# Patient Record
Sex: Female | Born: 1960 | Race: White | Hispanic: No | Smoking: Current every day smoker
Health system: Southern US, Community
[De-identification: ages and names within clinical notes are randomized; demographics above are authoritative.]

## PROBLEM LIST (undated history)

## (undated) DIAGNOSIS — J45909 Unspecified asthma, uncomplicated: Secondary | ICD-10-CM

## (undated) DIAGNOSIS — F419 Anxiety disorder, unspecified: Secondary | ICD-10-CM

## (undated) DIAGNOSIS — E785 Hyperlipidemia, unspecified: Secondary | ICD-10-CM

## (undated) DIAGNOSIS — F4310 Post-traumatic stress disorder, unspecified: Secondary | ICD-10-CM

## (undated) DIAGNOSIS — M419 Scoliosis, unspecified: Secondary | ICD-10-CM

## (undated) DIAGNOSIS — M7070 Other bursitis of hip, unspecified hip: Secondary | ICD-10-CM

## (undated) HISTORY — DX: Anxiety disorder, unspecified: F41.9

## (undated) HISTORY — PX: ENDOMETRIAL ABLATION: SHX621

## (undated) HISTORY — PX: CHOLECYSTECTOMY: SHX55

## (undated) HISTORY — PX: ABDOMINAL HYSTERECTOMY: SHX81

## (undated) HISTORY — PX: NOSE SURGERY: SHX723

## (undated) HISTORY — DX: Other bursitis of hip, unspecified hip: M70.70

---

## 2005-03-04 ENCOUNTER — Emergency Department: Payer: Self-pay | Admitting: Emergency Medicine

## 2007-05-29 ENCOUNTER — Ambulatory Visit: Payer: Self-pay | Admitting: Gastroenterology

## 2007-06-21 ENCOUNTER — Emergency Department: Payer: Self-pay | Admitting: Emergency Medicine

## 2007-07-18 ENCOUNTER — Ambulatory Visit: Payer: Self-pay | Admitting: Internal Medicine

## 2010-01-25 ENCOUNTER — Ambulatory Visit: Payer: Self-pay | Admitting: Internal Medicine

## 2010-05-26 ENCOUNTER — Ambulatory Visit: Payer: Self-pay | Admitting: General Practice

## 2010-06-30 ENCOUNTER — Other Ambulatory Visit: Payer: Self-pay | Admitting: Family

## 2010-06-30 ENCOUNTER — Ambulatory Visit: Payer: Self-pay | Admitting: Gastroenterology

## 2010-07-12 ENCOUNTER — Ambulatory Visit: Payer: Self-pay | Admitting: Gastroenterology

## 2010-07-17 LAB — PATHOLOGY REPORT

## 2011-05-15 ENCOUNTER — Ambulatory Visit: Payer: Self-pay

## 2011-09-27 ENCOUNTER — Ambulatory Visit: Payer: Self-pay | Admitting: Internal Medicine

## 2012-04-01 ENCOUNTER — Other Ambulatory Visit: Payer: Self-pay | Admitting: Gastroenterology

## 2012-04-01 LAB — WBCS, STOOL

## 2012-04-03 LAB — STOOL CULTURE

## 2012-04-22 ENCOUNTER — Ambulatory Visit: Payer: Self-pay | Admitting: Internal Medicine

## 2012-05-05 ENCOUNTER — Ambulatory Visit: Payer: Self-pay | Admitting: Gastroenterology

## 2012-05-16 ENCOUNTER — Ambulatory Visit: Payer: Self-pay | Admitting: Gastroenterology

## 2012-05-30 ENCOUNTER — Ambulatory Visit: Payer: Self-pay | Admitting: Gastroenterology

## 2012-05-30 LAB — KOH PREP

## 2012-06-03 LAB — PATHOLOGY REPORT

## 2013-01-14 ENCOUNTER — Ambulatory Visit: Payer: Self-pay | Admitting: Otolaryngology

## 2013-04-23 ENCOUNTER — Ambulatory Visit: Payer: Self-pay | Admitting: Internal Medicine

## 2014-08-16 ENCOUNTER — Ambulatory Visit: Payer: Self-pay | Admitting: Family Medicine

## 2015-02-11 NOTE — Op Note (Signed)
PATIENT NAME:  Tami Hopkins, Tami Hopkins MR#:  [id number] DATE OF BIRTH:  26-Jul-1961  DATE OF SURGERY: 01/14/2013.   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   POSTOPERATIVE DIAGNOSIS: Nasal obstruction with septal deviation and turbinate hypertrophy.    PROCEDURES:  1.  Nasal septoplasty. 2.  Bilateral submucous resection of the inferior turbinates.   SURGEON:  Malon Kindle.   ANESTHESIA: General endotracheal.   INDICATIONS: The patient with a history of nasal obstruction, with a leftward septal deviation and turbinate hypertrophy.   FINDINGS: The septum was markedly deviated to the left and the turbinates were moderately hypertrophic, contributing to nasal obstruction.   COMPLICATIONS: None.   DESCRIPTION OF PROCEDURE: After obtaining informed consent the patient was taken to the operating room and placed in the supine position. After induction of general endotracheal anesthesia the patient was turned 90 degrees. The nose was decongested with Afrin pledgets. One-percent lidocaine with epinephrine 1:200,000 was injected into either side of the nasal septum as well as into the inferior turbinates. She was then prepped and draped in the usual sterile fashion. A 15-blade was used to incise the nasal septum on the left along the caudal aspect of the septum, and a mucoperichondrial flap was then elevated on the left side. This was elevated back over the bony-cartilaginous junction which was separated and the cartilage allowed to swing into the midline. The bony septum was markedly deviated to the left, and this was resected superiorly with scissors and inferiorly from the maxillary crest with a chisel. Removal of the deformed bony septum allowed the entire septum to swing midline and appeared perfectly straight at this point.   There were some minor tears in the septal flaps. The septal incision was closed with 4-0 chromic gut suture. The mucosal flaps were  reapproximated to the underlying cartilage with 4-0 plain gut suture in a running, quilting-type stitch, taking care to reapproximate the gapping in the small tears in the septal flaps. Rather than place splints because of the patient's history of claustrophobia, more time was spent with the mattress sutures to help tack down the  mucoperichondrial flaps to prevent septal hematoma. The left inferior turbinate was then incised along its caudal aspect with a 15-blade. The mucoperiosteum was elevated off of the concha and the concha with a portion of the lateral mucosa was resected with scissors. The cut edge of the turbinate was cauterized with the suction cautery to control bleeding. The remainder of the turbinate was lateralized with a heavy elevator.   The same procedure was then performed on the right inferior turbinate. Afrin-moistened pledgets were reapplied to control minor oozing and the patient's nose suctioned to remove any blood clot. She was then returned to the anesthesiologist for awakening. She was awakened and taken to the recovery room in good condition postoperatively.   Blood loss was approximately 50 mL.    ____________________________ Sammuel Hines. Richardson Landry, MD psb:dm D: 01/14/2013 08:49:00 ET T: 01/14/2013 09:14:49 ET JOB#: [id number]  cc: Sammuel Hines. Richardson Landry, MD, <Dictator> Riley Nearing MD ELECTRONICALLY SIGNED 01/16/2013 8:36

## 2015-04-09 ENCOUNTER — Emergency Department: Payer: Medicare Other

## 2015-04-09 ENCOUNTER — Other Ambulatory Visit: Payer: Self-pay

## 2015-04-09 ENCOUNTER — Emergency Department
Admission: EM | Admit: 2015-04-09 | Discharge: 2015-04-09 | Disposition: A | Discharge status: Home / Self Care | Payer: Medicare Other | Attending: Emergency Medicine | Admitting: Emergency Medicine

## 2015-04-09 ENCOUNTER — Encounter: Payer: Self-pay | Admitting: Emergency Medicine

## 2015-04-09 DIAGNOSIS — S62397A Other fracture of fifth metacarpal bone, left hand, initial encounter for closed fracture: Secondary | ICD-10-CM | POA: Insufficient documentation

## 2015-04-09 DIAGNOSIS — W109XXA Fall (on) (from) unspecified stairs and steps, initial encounter: Secondary | ICD-10-CM | POA: Diagnosis not present

## 2015-04-09 DIAGNOSIS — Y998 Other external cause status: Secondary | ICD-10-CM | POA: Diagnosis not present

## 2015-04-09 DIAGNOSIS — Y92009 Unspecified place in unspecified non-institutional (private) residence as the place of occurrence of the external cause: Secondary | ICD-10-CM | POA: Diagnosis not present

## 2015-04-09 DIAGNOSIS — S6992XA Unspecified injury of left wrist, hand and finger(s), initial encounter: Secondary | ICD-10-CM | POA: Diagnosis present

## 2015-04-09 DIAGNOSIS — Z880 Allergy status to penicillin: Secondary | ICD-10-CM | POA: Diagnosis not present

## 2015-04-09 DIAGNOSIS — Y9389 Activity, other specified: Secondary | ICD-10-CM | POA: Insufficient documentation

## 2015-04-09 DIAGNOSIS — Z720 Tobacco use: Secondary | ICD-10-CM | POA: Insufficient documentation

## 2015-04-09 DIAGNOSIS — S62307A Unspecified fracture of fifth metacarpal bone, left hand, initial encounter for closed fracture: Secondary | ICD-10-CM

## 2015-04-09 DIAGNOSIS — R42 Dizziness and giddiness: Secondary | ICD-10-CM | POA: Diagnosis not present

## 2015-04-09 DIAGNOSIS — F4310 Post-traumatic stress disorder, unspecified: Secondary | ICD-10-CM | POA: Diagnosis not present

## 2015-04-09 HISTORY — DX: Scoliosis, unspecified: M41.9

## 2015-04-09 HISTORY — DX: Hyperlipidemia, unspecified: E78.5

## 2015-04-09 HISTORY — DX: Post-traumatic stress disorder, unspecified: F43.10

## 2015-04-09 LAB — TROPONIN I

## 2015-04-09 LAB — CBC
HCT: 37.9 % (ref 35.0–47.0)
Hemoglobin: 12.7 g/dL (ref 12.0–16.0)
MCH: 30.6 pg (ref 26.0–34.0)
MCHC: 33.4 g/dL (ref 32.0–36.0)
MCV: 91.5 fL (ref 80.0–100.0)
PLATELETS: 214 10*3/uL (ref 150–440)
RBC: 4.14 MIL/uL (ref 3.80–5.20)
RDW: 14.1 % (ref 11.5–14.5)
WBC: 8.6 10*3/uL (ref 3.6–11.0)

## 2015-04-09 NOTE — ED Provider Notes (Signed)
[facility] Emergency Department Provider Note  ____________________________________________  Time seen: 1:45 PM  I have reviewed the triage vital signs and the nursing notes.   HISTORY  Chief Complaint Fall and Laceration      HPI Tami Hopkins is a 54 y.o. female presents status post fall "couple of days ago" down unknown number of steps at home. Patient unsure as to what the cause of the fall was, states "I must of had a wave of dizziness". Patient states "my main concern is my left hand is swollen and hurts". Patient unsure if any LOC secondary to fall.     Past Medical History  Diagnosis Date  . PTSD (post-traumatic stress disorder)   . Scoliosis   . Hyperlipidemia     There are no active problems to display for this patient.   Past Surgical History  Procedure Laterality Date  . Cesarean section    . Endometrial ablation      x3  . Abdominal hysterectomy      No current outpatient prescriptions on file.  Allergies Penicillins and Sulfa antibiotics  No family history on file.  Social History History  Substance Use Topics  . Smoking status: Current Every Day Smoker -- 0.50 packs/day    Types: Cigarettes  . Smokeless tobacco: Never Used  . Alcohol Use: Yes     Comment: rare    Review of Systems  Constitutional: Negative for fever. Eyes: Negative for visual changes. ENT: Negative for sore throat. Cardiovascular: Negative for chest pain. Respiratory: Negative for shortness of breath. Gastrointestinal: Negative for abdominal pain, vomiting and diarrhea. Genitourinary: Negative for dysuria. Musculoskeletal: Negative for back pain. Positive left hand pain Skin: Negative for rash. Neurological: Negative for headaches, focal weakness or numbness.   10-point ROS otherwise negative.  ____________________________________________   PHYSICAL EXAM:  VITAL SIGNS: ED Triage Vitals  Enc Vitals Group     BP 04/09/15 1102  128/83 mmHg     Pulse Rate 04/09/15 1102 87     Resp 04/09/15 1102 18     Temp 04/09/15 1102 98.3 F (36.8 C)     Temp Source 04/09/15 1102 Oral     SpO2 04/09/15 1102 96 %     Weight 04/09/15 1102 168 lb (76.204 kg)     Height 04/09/15 1102 5\' 8"  (1.727 m)     Head Cir --      Peak Flow --      Pain Score 04/09/15 1103 10     Pain Loc --      Pain Edu? --      Excl. in [place]? --      Constitutional: Alert and oriented. Well appearing and in no distress. Eyes: Conjunctivae are normal. PERRL. Normal extraocular movements. ENT   Head: Normocephalic and atraumatic.   Nose: No congestion/rhinnorhea.   Mouth/Throat: Mucous membranes are moist.   Neck: No stridor. Hematological/Lymphatic/Immunilogical: No cervical lymphadenopathy. Cardiovascular: Normal rate, regular rhythm. Normal and symmetric distal pulses are present in all extremities. No murmurs, rubs, or gallops. Respiratory: Normal respiratory effort without tachypnea nor retractions. Breath sounds are clear and equal bilaterally. No wheezes/rales/rhonchi. Gastrointestinal: Soft and nontender. No distention. There is no CVA tenderness. Genitourinary: deferred Musculoskeletal: Nontender with normal range of motion in all extremities. No joint effusions.  No lower extremity tenderness nor edema. Neurologic:  Normal speech and language. No gross focal neurologic deficits are appreciated. Speech is normal.  Skin:  Skin is warm, dry and intact. No rash noted. Psychiatric:  Mood and affect are normal. Speech and behavior are normal. Patient exhibits appropriate insight and judgment.  ____________________________________________     RADIOLOGY  CAT scan of the head and cervical spine: IMPRESSION: No acute intracranial injury. No evidence of cervical spine injury. Chronic changes are noted.  Left Hand x-ray revealed:Moderately displaced fifth metacarpal  fracture. ___________________________________________   PROCEDURES  Procedure(s) performed: Ulnar gutter splint applied to the patient's left hand    ____________________________________________   INITIAL IMPRESSION / ASSESSMENT AND PLAN / ED COURSE  Pertinent labs & imaging results that were available during my care of the patient were reviewed by me and considered in my medical decision making (see chart for details).    ____________________________________________   FINAL CLINICAL IMPRESSION(S) / ED DIAGNOSES  Final diagnoses:  Fracture of fifth metacarpal bone of left hand, closed, initial encounter      Gregor Hams, MD 04/09/15 1438

## 2015-04-09 NOTE — ED Notes (Addendum)
Patient states a couple of days ago she fell down steps at home. Unsure of exactly how many steps, but has "15 steps". States she has had problems with balance for a couple of months, which is the same time she started Welbutrin for PTSD. Patient has approx 3 inch laceration to right mid forehead from incident with no bleeding present. Patient states she "must have had a wave of dizziness" at time of incident. Patient also states she is unable to recall events of two of the days this week.

## 2015-04-09 NOTE — Discharge Instructions (Signed)
Hand Fracture, Fifth Metacarpal The small metacarpal is the bone at the base of the little finger between the knuckle and the wrist. A fracture is a break in that bone. One of the fractures that is common to this bone is called a Boxer's Fracture. TREATMENT These fractures can be treated with:   Reduction (bones moved back into place), then pinned through the skin to maintain the position, and then casted for about 6 weeks or as your caregiver determines necessary.  ORIF (open reduction and internal fixation) - the fracture site is opened and the bone pieces are fixed into place with pins and then casted for approximately 6 weeks or as your caregiver determines necessary. Your caregiver will discuss the type of fracture you have and the treatment that should be best for that problem. If surgery is the treatment of choice, the following is information for you to know, and also let your caregiver know about prior to surgery.  LET YOUR CAREGIVER KNOW ABOUT:  Allergies.  Medications taken including herbs, eye drops, over the counter medications, and creams.  Use of steroids (by mouth or creams).  Previous problems with anesthetics or novocaine.  Possibility of pregnancy, if this applies.  History of blood clots (thrombophlebitis).  History of bleeding or blood problems.  Previous surgery.  Other health problems. AFTER THE PROCEDURE After surgery, you will be taken to the recovery area where a nurse will watch and check your progress. Once you're awake, stable, and taking fluids well, barring other problems you'll be allowed to go home. Once home an ice pack applied to your operative site may help with discomfort and keep the swelling down. HOME CARE INSTRUCTIONS   Follow your caregiver's instructions as to activities, exercises, physical therapy, and driving a car.  Daily exercise is helpful for maintaining range of motion (movement and mobility) and strength. Exercise as  instructed.  To lessen swelling, keep the injured hand elevated above the level of your heart as much as possible.  Apply ice to the injury for 15-20 minutes each hour while awake for the first 2 days. Put the ice in a plastic bag and place a thin towel between the bag of ice and your cast.  Move the fingers of your casted hand at least several times a day.  If a plaster or fiberglass cast was applied:  Do not try to scratch the skin under the cast using a sharp or pointed object.  Check the skin around the cast every day. You may put lotion on red or sore areas.  Keep your cast dry. Your cast can be protected during bathing with a plastic bag. Do not put your cast into the water.  If a plaster splint was applied:  Wear the splint for as long as directed by your caregiver or until seen for follow-up examination.  Do not get your splint wet. Protect it during bathing with a plastic bag.  You may loosen the elastic bandage around the splint if your fingers start to get numb, tingle, get cold or turn blue.  Do not put pressure on your cast or splint; this may cause it to break. Especially, do not lean plaster casts on hard surfaces for 24 hours after application.  Take medications as directed by your caregiver.  Only take over-the-counter or prescription medicines for pain, discomfort, or fever as directed by your caregiver.  Follow all instructions for physician referrals, physical therapy, and rehabilitation. Any delay in obtaining necessary care could result in  permanent injury, disability and chronic pain. SEEK MEDICAL CARE IF:   Increased bleeding (more than a small spot) from the wound or from beneath your cast or splint if there is a wound beneath the cast from surgery.  Redness, swelling, or increasing pain in the wound or from beneath your cast or splint.  Pus coming from wound or from beneath your cast or splint.  An unexplained oral temperature above 102 F (38.9 C)  develops.  A foul smell coming from the wound or dressing or from beneath your cast or splint.  You are unable to move your little finger. SEEK IMMEDIATE MEDICAL CARE IF:  You develop a rash, have difficulty breathing, or have any allergy problems. If you do not have a window in your cast for observing the wound, a discharge or minor bleeding may show up as a stain on the outside of your cast. Report these findings to your caregiver. MAKE SURE YOU:   Understand these instructions.  Will watch your condition.  Will get help right away if you are not doing well or get worse. Document Released: 01/14/2001 Document Revised: 12/31/2011 Document Reviewed: 05/27/2008 [facility] Patient Information 2015 [place], [place]. This information is not intended to replace advice given to you by your health care provider. Make sure you discuss any questions you have with your health care provider.

## 2015-04-09 NOTE — ED Notes (Signed)
Unlar Gutter applied to left wrist by Wallie Char

## 2015-04-10 LAB — BASIC METABOLIC PANEL
Anion gap: 9 (ref 5–15)
BUN: 12 mg/dL (ref 6–20)
CALCIUM: 8.9 mg/dL (ref 8.9–10.3)
CO2: 30 mmol/L (ref 22–32)
CREATININE: 0.92 mg/dL (ref 0.44–1.00)
Chloride: 93 mmol/L — ABNORMAL LOW (ref 101–111)
GFR calc Af Amer: >60 mL/min (ref >60)
GLUCOSE: 91 mg/dL (ref 65–99)
POTASSIUM: 2.4 mmol/L — AB (ref 3.5–5.1)
Sodium: 132 mmol/L — ABNORMAL LOW (ref 135–145)

## 2015-05-10 ENCOUNTER — Encounter: Payer: Self-pay | Admitting: *Deleted

## 2015-05-10 ENCOUNTER — Emergency Department: Payer: Medicare Other

## 2015-05-10 ENCOUNTER — Inpatient Hospital Stay
Admission: EM | Admit: 2015-05-10 | Discharge: 2015-05-15 | DRG: 917 | Disposition: A | Discharge status: Home / Self Care | Payer: Medicare Other | Attending: Internal Medicine | Admitting: Internal Medicine

## 2015-05-10 DIAGNOSIS — F17210 Nicotine dependence, cigarettes, uncomplicated: Secondary | ICD-10-CM | POA: Diagnosis present

## 2015-05-10 DIAGNOSIS — F4310 Post-traumatic stress disorder, unspecified: Secondary | ICD-10-CM | POA: Diagnosis present

## 2015-05-10 DIAGNOSIS — F29 Unspecified psychosis not due to a substance or known physiological condition: Secondary | ICD-10-CM | POA: Diagnosis present

## 2015-05-10 DIAGNOSIS — Z7951 Long term (current) use of inhaled steroids: Secondary | ICD-10-CM

## 2015-05-10 DIAGNOSIS — T43011A Poisoning by tricyclic antidepressants, accidental (unintentional), initial encounter: Secondary | ICD-10-CM | POA: Diagnosis not present

## 2015-05-10 DIAGNOSIS — Z79899 Other long term (current) drug therapy: Secondary | ICD-10-CM

## 2015-05-10 DIAGNOSIS — K219 Gastro-esophageal reflux disease without esophagitis: Secondary | ICD-10-CM | POA: Diagnosis present

## 2015-05-10 DIAGNOSIS — E785 Hyperlipidemia, unspecified: Secondary | ICD-10-CM | POA: Diagnosis present

## 2015-05-10 DIAGNOSIS — G92 Toxic encephalopathy: Secondary | ICD-10-CM | POA: Diagnosis present

## 2015-05-10 DIAGNOSIS — Z9109 Other allergy status, other than to drugs and biological substances: Secondary | ICD-10-CM

## 2015-05-10 DIAGNOSIS — J45909 Unspecified asthma, uncomplicated: Secondary | ICD-10-CM | POA: Diagnosis present

## 2015-05-10 DIAGNOSIS — M6282 Rhabdomyolysis: Secondary | ICD-10-CM | POA: Diagnosis not present

## 2015-05-10 DIAGNOSIS — F19921 Other psychoactive substance use, unspecified with intoxication with delirium: Secondary | ICD-10-CM

## 2015-05-10 DIAGNOSIS — Z880 Allergy status to penicillin: Secondary | ICD-10-CM | POA: Diagnosis not present

## 2015-05-10 DIAGNOSIS — M419 Scoliosis, unspecified: Secondary | ICD-10-CM | POA: Diagnosis present

## 2015-05-10 DIAGNOSIS — G9340 Encephalopathy, unspecified: Secondary | ICD-10-CM | POA: Diagnosis present

## 2015-05-10 DIAGNOSIS — Z882 Allergy status to sulfonamides status: Secondary | ICD-10-CM

## 2015-05-10 DIAGNOSIS — T50905A Adverse effect of unspecified drugs, medicaments and biological substances, initial encounter: Secondary | ICD-10-CM

## 2015-05-10 HISTORY — DX: Unspecified asthma, uncomplicated: J45.909

## 2015-05-10 LAB — URINE DRUG SCREEN, QUALITATIVE (ARMC ONLY)
Amphetamines, Ur Screen: NOT DETECTED
BENZODIAZEPINE, UR SCRN: POSITIVE — AB
Barbiturates, Ur Screen: NOT DETECTED
CANNABINOID 50 NG, UR ~~LOC~~: NOT DETECTED
Cocaine Metabolite,Ur ~~LOC~~: NOT DETECTED
MDMA (Ecstasy)Ur Screen: NOT DETECTED
METHADONE SCREEN, URINE: NOT DETECTED
Opiate, Ur Screen: NOT DETECTED
Phencyclidine (PCP) Ur S: NOT DETECTED
Tricyclic, Ur Screen: POSITIVE — AB

## 2015-05-10 LAB — URINALYSIS COMPLETE WITH MICROSCOPIC (ARMC ONLY)
BILIRUBIN URINE: NEGATIVE
Cellular Cast, UA: 5
Glucose, UA: NEGATIVE mg/dL
Leukocytes, UA: NEGATIVE
NITRITE: NEGATIVE
PH: 6 (ref 5.0–8.0)
Protein, ur: 100 mg/dL — AB
Specific Gravity, Urine: 1.018 (ref 1.005–1.030)

## 2015-05-10 LAB — COMPREHENSIVE METABOLIC PANEL
ALK PHOS: 102 U/L (ref 38–126)
ALT: 151 U/L — ABNORMAL HIGH (ref 14–54)
AST: 389 U/L — ABNORMAL HIGH (ref 15–41)
Albumin: 4.3 g/dL (ref 3.5–5.0)
Anion gap: 16 — ABNORMAL HIGH (ref 5–15)
BILIRUBIN TOTAL: 0.9 mg/dL (ref 0.3–1.2)
BUN: 28 mg/dL — ABNORMAL HIGH (ref 6–20)
CALCIUM: 9 mg/dL (ref 8.9–10.3)
CHLORIDE: 96 mmol/L — AB (ref 101–111)
CO2: 24 mmol/L (ref 22–32)
Creatinine, Ser: 1.39 mg/dL — ABNORMAL HIGH (ref 0.44–1.00)
GFR calc Af Amer: 48 mL/min — ABNORMAL LOW (ref >60)
GFR calc non Af Amer: 42 mL/min — ABNORMAL LOW (ref >60)
Glucose, Bld: 117 mg/dL — ABNORMAL HIGH (ref 65–99)
Potassium: 3.1 mmol/L — ABNORMAL LOW (ref 3.5–5.1)
Sodium: 136 mmol/L (ref 135–145)
TOTAL PROTEIN: 7.4 g/dL (ref 6.5–8.1)

## 2015-05-10 LAB — ETHANOL: Alcohol, Ethyl (B): <5 mg/dL (ref <5)

## 2015-05-10 LAB — CBC
HCT: 41.6 % (ref 35.0–47.0)
Hemoglobin: 14 g/dL (ref 12.0–16.0)
MCH: 30.6 pg (ref 26.0–34.0)
MCHC: 33.7 g/dL (ref 32.0–36.0)
MCV: 90.8 fL (ref 80.0–100.0)
Platelets: 272 10*3/uL (ref 150–440)
RBC: 4.58 MIL/uL (ref 3.80–5.20)
RDW: 14.3 % (ref 11.5–14.5)
WBC: 13.4 10*3/uL — AB (ref 3.6–11.0)

## 2015-05-10 LAB — CK: Total CK: 36173 U/L — ABNORMAL HIGH (ref 38–234)

## 2015-05-10 LAB — SALICYLATE LEVEL: Salicylate Lvl: <4 mg/dL (ref 2.8–30.0)

## 2015-05-10 LAB — LACTIC ACID, PLASMA: Lactic Acid, Venous: 1.4 mmol/L (ref 0.5–2.0)

## 2015-05-10 LAB — ACETAMINOPHEN LEVEL

## 2015-05-10 LAB — TROPONIN I: Troponin I: <0.03 ng/mL (ref <0.031)

## 2015-05-10 MED ORDER — SODIUM CHLORIDE 0.9 % IV SOLN
INTRAVENOUS | Status: DC
  Administered 2015-05-11: 02:00:00 via INTRAVENOUS

## 2015-05-10 MED ORDER — SODIUM CHLORIDE 0.9 % IV BOLUS (SEPSIS): 1000.0000 mL | Freq: Once | INTRAVENOUS | Status: DC

## 2015-05-10 MED ORDER — SODIUM BICARBONATE 8.4 % IV SOLN
INTRAVENOUS | Status: DC
  Administered 2015-05-10 – 2015-05-12 (×2): via INTRAVENOUS
  Filled 2015-05-10 (×7): qty 150

## 2015-05-10 MED ORDER — ACETAMINOPHEN 325 MG PO TABS
650.0000 mg | ORAL_TABLET | Freq: Four times a day (QID) | ORAL | Status: DC | PRN
  Administered 2015-05-14 – 2015-05-15 (×2): 650 mg via ORAL
  Filled 2015-05-10 (×2): qty 2

## 2015-05-10 MED ORDER — SODIUM CHLORIDE 0.9 % IJ SOLN
3.0000 mL | Freq: Two times a day (BID) | INTRAMUSCULAR | Status: DC
  Administered 2015-05-11 – 2015-05-15 (×4): 3 mL via INTRAVENOUS

## 2015-05-10 MED ORDER — SODIUM CHLORIDE 0.9 % IV SOLN
Freq: Once | INTRAVENOUS | Status: AC
  Administered 2015-05-10: 22:00:00 via INTRAVENOUS

## 2015-05-10 MED ORDER — HEPARIN SODIUM (PORCINE) 5000 UNIT/ML IJ SOLN
5000.0000 [IU] | Freq: Three times a day (TID) | INTRAMUSCULAR | Status: DC
  Administered 2015-05-11 – 2015-05-15 (×13): 5000 [IU] via SUBCUTANEOUS
  Filled 2015-05-10 (×14): qty 1

## 2015-05-10 MED ORDER — MORPHINE SULFATE 2 MG/ML IJ SOLN
2.0000 mg | INTRAMUSCULAR | Status: DC | PRN
  Administered 2015-05-12 (×4): 2 mg via INTRAVENOUS
  Filled 2015-05-10 (×4): qty 1

## 2015-05-10 MED ORDER — ACETAMINOPHEN 650 MG RE SUPP: 650.0000 mg | Freq: Four times a day (QID) | RECTAL | Status: DC | PRN

## 2015-05-10 NOTE — ED Notes (Signed)
Sitter at bedside.

## 2015-05-10 NOTE — ED Notes (Signed)
MD ordered 1:1 safety sitter at bedside.  Charge nurse notified.
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
## 2015-05-10 NOTE — ED Notes (Signed)
Found at home by her daughter, pt confused, mumblimg, pointing in air, looks to being poor health, has scattered  bruises in varies stages, skin hot, mouth very dry

## 2015-05-10 NOTE — ED Provider Notes (Addendum)
[facility] Emergency Department Provider Note  Time seen: 7:59 PM  I have reviewed the triage vital signs and the nursing notes.   HISTORY  Chief Complaint Altered Mental Status    HPI Tami Hopkins is a 54 y.o. female with a history of PTSD, hyperlipidemia, found on the floor by the daughter altered. According to the daughter patient takes a lot of medication, and she believes the patient may have accidentally taken too much. She found the patient on the floor, with a bicycle on top of her. The patient was talking nonsense, did not know where she was. Daughter states she has never seen her mother likely this before. Patient cannot give an accurate history, she is grabbing at things in the air which are not there. She is grabbing at her clothes.  She is mumbling, occasionally will talk, but her words do not make sense, and she rambles off.     Past Medical History  Diagnosis Date  . PTSD (post-traumatic stress disorder)   . Scoliosis   . Hyperlipidemia     There are no active problems to display for this patient.   Past Surgical History  Procedure Laterality Date  . Cesarean section    . Endometrial ablation      x3  . Abdominal hysterectomy      No current outpatient prescriptions on file.  Allergies Penicillins and Sulfa antibiotics  No family history on file.  Social History History  Substance Use Topics  . Smoking status: Current Every Day Smoker -- 0.50 packs/day    Types: Cigarettes  . Smokeless tobacco: Never Used  . Alcohol Use: Yes     Comment: rare    Review of Systems Unable to obtain a review of systems as the patient is altered.  ____________________________________________   PHYSICAL EXAM:  VITAL SIGNS: ED Triage Vitals  Enc Vitals Group     BP 05/10/15 1842 136/103 mmHg     Pulse Rate 05/10/15 1842 105     Resp 05/10/15 1842 28     Temp 05/10/15 1842 99.9 F (37.7 C)     Temp Source 05/10/15 1842 Oral   SpO2 05/10/15 1842 96 %     Weight --      Height --      Head Cir --      Peak Flow --      Pain Score --      Pain Loc --      Pain Edu? --      Excl. in [place]? --     Constitutional: Alert, eyes open, looking around the room, will look at you and make eye contact, but is also grabbing at things in the room that do not exist. She is talking nonsense, unable to give any history whatsoever. Eyes: 3-4 mm, PERRL bilaterally ENT   Head: Normocephalic and atraumatic.   Mouth/Throat: Very dry mucous membranes Cardiovascular: Normal rate, regular rhythm. No murmur Respiratory: Tachypnea around 30 breaths per minute. No wheezes rales or rhonchi. Gastrointestinal: Soft and nontender. No distention.   Musculoskeletal: Patient moves all extremities at times. Appears to have pain with right lower extremity movement. Patient has moderate tenderness to right hip palpation. Patient has moderate tenderness to right shoulder palpation. No apparent pain to cervical spine palpation or back. Neurologic:  Moves all extremities at times, some with pain. Cannot perform a more thorough neurologic exam at this time. Skin:  Skin is warm, dry and intact.  Psychiatric: Patient altered,  unable to provide history. Does not appear to currently have capacity to make medical decisions.  ____________________________________________    EKG  EKG reviewed and interpreted by myself shows a junctional rhythm at 102 bpm, narrow QRS, normal axis, prolonged QTC is 690 ms. Nonspecific ST changes, no ST elevations noted.  ____________________________________________    RADIOLOGY  CT head within normal limits Right shoulder x-ray within normal limits Right hip x-ray negative  ____________________________________________    INITIAL IMPRESSION / ASSESSMENT AND PLAN / ED COURSE  Pertinent labs & imaging results that were available during my care of the patient were reviewed by me and considered in my medical  decision making (see chart for details).  Patient is altered, pulse of 105, temperature 99.9. Patient has very dry mucous membranes. Daughter brought the patient's medications she is on many anticholinergic medications such as diphenhydramine, meclizine. Patient has a prolonged QTC on EKG. Patient also has an empty bottle of amitriptyline, which was filled on 04/30/15, but it does not have the lid, so the daughter is not sure if the patient took the medication or if they spilled.  We will obtain lab work, urine, CT head, right hip x-ray, right shoulder x-ray to help further evaluate. Given the patient's prolonged QT, empty bottle of amitriptyline, and altered mental status we will start the patient on a sodium bicarbonate drip as a precautionary measure for any possible TCA overdose.   Imaging a within normal limits. Urine drug screen is positive for TCA and benzodiazepine's. Urine is positive for hemoglobin, without red blood cells, suspect likely rhabdomyolysis, currently awaiting CK result. We will continue to IV hydrate.  Lab is called and CK is resulted in greater than 30,000. Patient started on continuous fluids and admitted to the hospital for further evaluation and treatment. Currently awaiting acetaminophen, salicylate levels.  CRITICAL CARE Performed by: Harvest Dark   Total critical care time: 30 minutes  Critical care time was exclusive of separately billable procedures and treating other patients.  Critical care was necessary to treat or prevent imminent or life-threatening deterioration.  Critical care was time spent personally by me on the following activities: development of treatment plan with patient and/or surrogate as well as nursing, discussions with consultants, evaluation of patient's response to treatment, examination of patient, obtaining history from patient or surrogate, ordering and performing treatments and interventions, ordering and review of laboratory  studies, ordering and review of radiographic studies, pulse oximetry and re-evaluation of patient's condition.  ____________________________________________   FINAL CLINICAL IMPRESSION(S) / ED DIAGNOSES  Altered mental status Rhabdomyolysis  Harvest Dark, MD 05/10/15 7989  Harvest Dark, MD 05/10/15 2218

## 2015-05-10 NOTE — ED Notes (Addendum)
Garth Bigness (daughter) [phone]

## 2015-05-11 LAB — POTASSIUM: Potassium: 2.8 mmol/L — CL (ref 3.5–5.1)

## 2015-05-11 LAB — BASIC METABOLIC PANEL
ANION GAP: 13 (ref 5–15)
BUN: 23 mg/dL — ABNORMAL HIGH (ref 6–20)
CHLORIDE: 98 mmol/L — AB (ref 101–111)
CO2: 25 mmol/L (ref 22–32)
Calcium: 8.2 mg/dL — ABNORMAL LOW (ref 8.9–10.3)
Creatinine, Ser: 0.98 mg/dL (ref 0.44–1.00)
GFR calc Af Amer: >60 mL/min (ref >60)
GFR calc non Af Amer: >60 mL/min (ref >60)
GLUCOSE: 104 mg/dL — AB (ref 65–99)
POTASSIUM: 3 mmol/L — AB (ref 3.5–5.1)
Sodium: 136 mmol/L (ref 135–145)

## 2015-05-11 LAB — HEPATIC FUNCTION PANEL
ALT: 149 U/L — AB (ref 14–54)
AST: 368 U/L — AB (ref 15–41)
Albumin: 3.5 g/dL (ref 3.5–5.0)
Alkaline Phosphatase: 72 U/L (ref 38–126)
Bilirubin, Direct: 0.2 mg/dL (ref 0.1–0.5)
Indirect Bilirubin: 0.6 mg/dL (ref 0.3–0.9)
Total Bilirubin: 0.8 mg/dL (ref 0.3–1.2)
Total Protein: 6 g/dL — ABNORMAL LOW (ref 6.5–8.1)

## 2015-05-11 LAB — LACTIC ACID, PLASMA: Lactic Acid, Venous: 1.1 mmol/L (ref 0.5–2.0)

## 2015-05-11 LAB — CK
CK TOTAL: 33338 U/L — AB (ref 38–234)
Total CK: 27953 U/L — ABNORMAL HIGH (ref 38–234)

## 2015-05-11 LAB — CBC
HCT: 36.1 % (ref 35.0–47.0)
Hemoglobin: 12.1 g/dL (ref 12.0–16.0)
MCH: 30.7 pg (ref 26.0–34.0)
MCHC: 33.6 g/dL (ref 32.0–36.0)
MCV: 91.1 fL (ref 80.0–100.0)
PLATELETS: 223 10*3/uL (ref 150–440)
RBC: 3.96 MIL/uL (ref 3.80–5.20)
RDW: 14.3 % (ref 11.5–14.5)
WBC: 12 10*3/uL — AB (ref 3.6–11.0)

## 2015-05-11 LAB — ACETAMINOPHEN LEVEL: Acetaminophen (Tylenol), Serum: <10 ug/mL — ABNORMAL LOW (ref 10–30)

## 2015-05-11 LAB — SALICYLATE LEVEL

## 2015-05-11 LAB — AMMONIA: Ammonia: 12 umol/L (ref 9–35)

## 2015-05-11 LAB — MAGNESIUM: Magnesium: 2 mg/dL (ref 1.7–2.4)

## 2015-05-11 LAB — TSH: TSH: 0.912 u[IU]/mL (ref 0.350–4.500)

## 2015-05-11 MED ORDER — LORAZEPAM 2 MG/ML IJ SOLN
2.0000 mg | INTRAMUSCULAR | Status: DC | PRN
  Administered 2015-05-12: 2 mg via INTRAVENOUS
  Filled 2015-05-11: qty 1

## 2015-05-11 MED ORDER — BUPROPION HCL ER (SMOKING DET) 150 MG PO TB12
150.0000 mg | ORAL_TABLET | Freq: Every morning | ORAL | Status: DC
  Administered 2015-05-11: 150 mg via ORAL
  Filled 2015-05-11: qty 1

## 2015-05-11 MED ORDER — PANTOPRAZOLE SODIUM 40 MG PO TBEC
40.0000 mg | DELAYED_RELEASE_TABLET | Freq: Every day | ORAL | Status: DC
  Administered 2015-05-11 – 2015-05-15 (×4): 40 mg via ORAL
  Filled 2015-05-11 (×5): qty 1

## 2015-05-11 MED ORDER — BACLOFEN 10 MG PO TABS: 10.0000 mg | ORAL_TABLET | Freq: Two times a day (BID) | ORAL | Status: DC | PRN

## 2015-05-11 MED ORDER — POTASSIUM CHLORIDE IN NACL 40-0.9 MEQ/L-% IV SOLN
INTRAVENOUS | Status: DC
  Administered 2015-05-11: 100 mL/h via INTRAVENOUS
  Filled 2015-05-11 (×8): qty 1000

## 2015-05-11 MED ORDER — POTASSIUM CHLORIDE CRYS ER 20 MEQ PO TBCR
40.0000 meq | EXTENDED_RELEASE_TABLET | ORAL | Status: AC
  Administered 2015-05-11 (×2): 40 meq via ORAL
  Filled 2015-05-11 (×2): qty 2

## 2015-05-11 MED ORDER — MONTELUKAST SODIUM 10 MG PO TABS
10.0000 mg | ORAL_TABLET | Freq: Every day | ORAL | Status: DC
  Administered 2015-05-11 – 2015-05-14 (×3): 10 mg via ORAL
  Filled 2015-05-11 (×4): qty 1

## 2015-05-11 NOTE — Consult Note (Signed)
  I was not informed of this consult until 5:30 in the evening of July 20. Consult will be performed tomorrow.

## 2015-05-11 NOTE — Progress Notes (Signed)
Patient arrived to the unit from ED. Patient is alert to self and expressing verbal and visual halucations.VSS stable cardiac monitoring applied. Skin multiple brusies noted on body. IV fluids infusing. Sitter and family at bedside. Call bell within reach.

## 2015-05-11 NOTE — Progress Notes (Signed)
[facility] at [place] NAME: Tami Hopkins    MR#:  [id number]  DATE OF BIRTH:  [birth year]  SUBJECTIVE:  CHIEF COMPLAINT:   Chief Complaint  Patient presents with  . Altered Mental Status  Still confused. Sitter at bedside.  REVIEW OF SYSTEMS:    ROS Unobtainable due to confusion   DRUG ALLERGIES:   Allergies  Allergen Reactions  . Penicillins Anaphylaxis    Any "cillins"  . Sulfa Antibiotics Anaphylaxis    VITALS:  Blood pressure 130/82, pulse 100, temperature 98.3 F (36.8 C), temperature source Oral, resp. rate 20, weight 78.064 kg (172 lb 1.6 oz), SpO2 97 %.  PHYSICAL EXAMINATION:   Physical Exam  GENERAL:  54 y.o.-year-old patient lying in the bed with no acute distress.  EYES: Pupils equal, round, reactive to light and accommodation. No scleral icterus. Extraocular muscles intact.  HEENT: Head atraumatic, normocephalic. Oropharynx and nasopharynx clear.  NECK:  Supple, no jugular venous distention. No thyroid enlargement, no tenderness.  LUNGS: Normal breath sounds bilaterally, no wheezing, rales, rhonchi. No use of accessory muscles of respiration.  CARDIOVASCULAR: S1, S2 normal. No murmurs, rubs, or gallops.  ABDOMEN: Soft, nontender, nondistended. Bowel sounds present. No organomegaly or mass.  EXTREMITIES: No cyanosis, clubbing or edema b/l.    NEUROLOGIC: Cranial nerves II through XII are intact. No focal Motor or sensory deficits b/l.   PSYCHIATRIC: The patient is alert and awake. Confused. Follows commands SKIN: No obvious rash, lesion, or ulcer. Bruises right shoulder.   LABORATORY PANEL:   CBC  Recent Labs Lab 05/11/15 0130  WBC 12.0*  HGB 12.1  HCT 36.1  PLT 223   ------------------------------------------------------------------------------------------------------------------  Chemistries   Recent Labs Lab 05/11/15 0130 05/11/15 0918  NA 136  --   K 3.0*  --   CL 98*  --   CO2  25  --   GLUCOSE 104*  --   BUN 23*  --   CREATININE 0.98  --   CALCIUM 8.2*  --   AST  --  368*  ALT  --  149*  ALKPHOS  --  72  BILITOT  --  0.8   ------------------------------------------------------------------------------------------------------------------  Cardiac Enzymes  Recent Labs Lab 05/10/15 1847  TROPONINI <0.03   ------------------------------------------------------------------------------------------------------------------  RADIOLOGY:  Dg Shoulder Right  05/10/2015   CLINICAL DATA:  Found at home by daughter confused altered mental status bruising over right shoulder  EXAM: RIGHT SHOULDER - 2+ VIEW  COMPARISON:  None.  FINDINGS: There is no evidence of fracture or dislocation. There is no evidence of arthropathy or other focal bone abnormality. Soft tissues are unremarkable.  IMPRESSION: Negative.   Electronically Signed   By: Skipper Cliche M.D.   On: 05/10/2015 20:51   Ct Head Wo Contrast  05/10/2015   CLINICAL DATA:  Patient confused and altered speech  EXAM: CT HEAD WITHOUT CONTRAST  TECHNIQUE: Contiguous axial images were obtained from the base of the skull through the vertex without intravenous contrast.  COMPARISON:  04/09/2015  FINDINGS: Normal attenuation with no abnormalities to suggest mass infarct hemorrhage or extra-axial fluid. No hydrocephalus. Tiny calcified meningioma left posterior fossa stable. Calvarium intact. No significant inflammatory change in the visualized portions of the sinuses.  IMPRESSION: No acute abnormalities.   Electronically Signed   By: Skipper Cliche M.D.   On: 05/10/2015 20:31   Dg Hip Unilat With Pelvis 2-3 Views Right  05/10/2015   CLINICAL DATA:  Found at home  by daughter confused altered mental status, bruising right hip region  EXAM: DG HIP (WITH OR WITHOUT PELVIS) 2-3V RIGHT  COMPARISON:  None.  FINDINGS: Pelvic bones intact. No evidence of right femur fracture or dislocation. Mild bilateral hip arthritis.  IMPRESSION: No  acute findings   Electronically Signed   By: Skipper Cliche M.D.   On: 05/10/2015 20:49     ASSESSMENT AND PLAN:   54 year old Caucasian female history of hyperlipidemia unspecified PTSD presenting after being found on the floor in an altered state.  * Acute encephalopathy Etiology unclear. Overdose? Metabolic? Check Ammonia, TSH. UDS negative. CT head normal. Will request Psychiatry to see patient.  * TCA overdose? Unclear if she overdoses. Continue Bicarb drip for now.  * Acute rhabdomyolysis IVF. Monitor Cr. Repeat CK  * DVT prophylaxis Lovenox   All the records are reviewed and case discussed with Care Management/Social Workerr. Management plans discussed with the patient, family and they are in agreement.  CODE STATUS: FULL  DVT Prophylaxis: SCDs  TOTAL TIME TAKING CARE OF THIS PATIENT: 40 minutes.   POSSIBLE D/C IN 1-2 DAYS, DEPENDING ON CLINICAL CONDITION.  Discussed with family at bedside.   Tami Hopkins R M.D on 05/11/2015 at 10:38 AM  Between 7am to 6pm - Pager - [phone]  After 6pm go to [email or web address] - password [email or web address] [facility] [place] [facility]  Office  [phone]  CC: Primary care physician; Vista Mink, FNP

## 2015-05-11 NOTE — Progress Notes (Addendum)
Delete.

## 2015-05-11 NOTE — Progress Notes (Signed)
   05/11/15 2000  Clinical Encounter Type  Visited With Patient and family together  Visit Type Spiritual support  Spiritual Encounters  Spiritual Needs Prayer  Stress Factors  Patient Stress Factors Health changes  Family Stress Factors Health changes   Faith tradition: unable to give Status: very jovial and confused Family: daughter, mother, sister, CNA Visit Assessment: The chaplain did converse very briefly with the patient. She is very talkative but most of the time confused. Her family is very worried and the mother is grieving and very nervous. The daughter says that her mother took to much of her medicine and has been confused for 2 days. Her sister is very stressed. The chaplain sat with the family and offered encouraging words. The patient will be lifted up in prayer.  The chaplains and pastoral care can be reached via pager [phone] and by submitting an online request

## 2015-05-11 NOTE — Plan of Care (Addendum)
Problem: Discharge Progression Outcomes Goal: Tolerating diet Outcome: Progressing Patient remains completely confused, pleasant attempting to carry on conversation but is not appropriate and conversation does not make any sense.  Patient's family at bedside and continues to confirm that she is normally completely alert and oriented, Eating a little more at meals that she was.  Patient still not able to follow simple commands completely, swallowing ok.  VSS, Patient retaining urine today attempted to get her up to bedside commode she was unable to follow any commands and was grabbing on the iv pole to get up, very unsafe and unsteady so we did not attempt to get her up again, tried bedpan she was unable to urinate.  Bladder scanner showed over 800 urine, called MD and received order for foley catheter, catheter was inserted without any difficulty 1800 ml tea colored urine emptied since inserting foley.  Patient also accidentally pulled IV out earlier, new site placed without any difficulty.

## 2015-05-11 NOTE — Plan of Care (Signed)
Problem: Problem: Neuro Progression Goal: APPROPRIATE MENTAL STATUS Altered mental status. Visual hallucinations, disoriented to place, time, situation, babbling

## 2015-05-11 NOTE — Progress Notes (Signed)
Burley Saver, took patient's home medication home.

## 2015-05-12 DIAGNOSIS — F19921 Other psychoactive substance use, unspecified with intoxication with delirium: Secondary | ICD-10-CM

## 2015-05-12 DIAGNOSIS — R410 Disorientation, unspecified: Secondary | ICD-10-CM

## 2015-05-12 DIAGNOSIS — F4310 Post-traumatic stress disorder, unspecified: Secondary | ICD-10-CM

## 2015-05-12 LAB — COMPREHENSIVE METABOLIC PANEL
ALK PHOS: 67 U/L (ref 38–126)
ALT: 170 U/L — ABNORMAL HIGH (ref 14–54)
AST: 454 U/L — ABNORMAL HIGH (ref 15–41)
Albumin: 3.3 g/dL — ABNORMAL LOW (ref 3.5–5.0)
Anion gap: 6 (ref 5–15)
BILIRUBIN TOTAL: 0.8 mg/dL (ref 0.3–1.2)
BUN: 11 mg/dL (ref 6–20)
CO2: 27 mmol/L (ref 22–32)
CREATININE: 0.71 mg/dL (ref 0.44–1.00)
Calcium: 8.1 mg/dL — ABNORMAL LOW (ref 8.9–10.3)
Chloride: 109 mmol/L (ref 101–111)
GFR calc Af Amer: >60 mL/min (ref >60)
GFR calc non Af Amer: >60 mL/min (ref >60)
Glucose, Bld: 90 mg/dL (ref 65–99)
POTASSIUM: 3.7 mmol/L (ref 3.5–5.1)
Sodium: 142 mmol/L (ref 135–145)
Total Protein: 5.8 g/dL — ABNORMAL LOW (ref 6.5–8.1)

## 2015-05-12 LAB — URINE CULTURE: Culture: NO GROWTH

## 2015-05-12 LAB — CK: CK TOTAL: 23685 U/L — AB (ref 38–234)

## 2015-05-12 MED ORDER — HALOPERIDOL LACTATE 5 MG/ML IJ SOLN
1.0000 mg | INTRAMUSCULAR | Status: AC
  Administered 2015-05-12: 1 mg via INTRAVENOUS
  Filled 2015-05-12: qty 1

## 2015-05-12 MED ORDER — HALOPERIDOL LACTATE 5 MG/ML IJ SOLN
1.0000 mg | Freq: Four times a day (QID) | INTRAMUSCULAR | Status: DC | PRN
  Administered 2015-05-12 – 2015-05-13 (×3): 1 mg via INTRAVENOUS
  Filled 2015-05-12 (×3): qty 1

## 2015-05-12 MED ORDER — DIPHENHYDRAMINE HCL 50 MG/ML IJ SOLN
12.5000 mg | Freq: Once | INTRAMUSCULAR | Status: AC
  Administered 2015-05-12: 12.5 mg via INTRAVENOUS
  Filled 2015-05-12: qty 1

## 2015-05-12 NOTE — Progress Notes (Addendum)
Patient unable to articulate or communicate clearly with reason to any persons.  Unable to state pain level.  Dr. Weber Cooks consulting today.  Sitter 1:1 entire shift.  Patient constantly attempting to get up out of bed but was unable to work with PT, unable to follow commands, pulling at lines, drains.  No progression in mental status this shift.

## 2015-05-12 NOTE — Progress Notes (Signed)
PT Cancellation Note  Patient Details Name: Tami Hopkins MRN: [id number] DOB: 07-08-1961   Cancelled Treatment:    Reason Eval/Treat Not Completed: Patient's level of consciousness. Attempted evaluation for approximately 5-10 minutes. Pt has periods of lucency but for the most part is writhing in bed and speaking incoherently. Pt agitated and tells therapy to stay away from her. Refuses to sit at EOB or ambulate. Will attempt PT evaluation on later date/time.  Lyndel Safe Waymon Laser PT, DPT   Rozetta Stumpp 05/12/2015, 11:10 AM

## 2015-05-12 NOTE — Progress Notes (Signed)
[facility] at [place] NAME: Tami Hopkins    MR#:  [id number]  DATE OF BIRTH:  10/07/1961  SUBJECTIVE:  CHIEF COMPLAINT:   Chief Complaint  Patient presents with  . Altered Mental Status  Still confused. Sitter at bedside.  REVIEW OF SYSTEMS:    ROS Unobtainable due to confusion   DRUG ALLERGIES:   Allergies  Allergen Reactions  . Penicillins Anaphylaxis    Any "cillins"  . Sulfa Antibiotics Anaphylaxis    VITALS:  Blood pressure 119/85, pulse 94, temperature 98.9 F (37.2 C), temperature source Oral, resp. rate 18, weight 78.064 kg (172 lb 1.6 oz), SpO2 93 %.  PHYSICAL EXAMINATION:   Physical Exam  GENERAL:  53 y.o.-year-old patient lying in the bed with no acute distress.  EYES: Pupils equal, round, reactive to light and accommodation. No scleral icterus. Extraocular muscles intact.  HEENT: Head atraumatic, normocephalic. Oropharynx and nasopharynx clear.  NECK:  Supple, no jugular venous distention. No thyroid enlargement, no tenderness.  LUNGS: Normal breath sounds bilaterally, no wheezing, rales, rhonchi. No use of accessory muscles of respiration.  CARDIOVASCULAR: S1, S2 normal. No murmurs, rubs, or gallops.  ABDOMEN: Soft, nontender, nondistended. Bowel sounds present. No organomegaly or mass.  EXTREMITIES: No cyanosis, clubbing or edema b/l.    NEUROLOGIC: Cranial nerves II through XII are intact. No focal Motor or sensory deficits b/l.   PSYCHIATRIC: The patient is alert and awake. Confused. Follows commands SKIN: No obvious rash, lesion, or ulcer. Bruises right shoulder.   LABORATORY PANEL:   CBC  Recent Labs Lab 05/11/15 0130  WBC 12.0*  HGB 12.1  HCT 36.1  PLT 223   ------------------------------------------------------------------------------------------------------------------  Chemistries   Recent Labs Lab 05/11/15 0918 05/12/15 0618  NA  --  142  K 2.8* 3.7  CL  --  109  CO2   --  27  GLUCOSE  --  90  BUN  --  11  CREATININE  --  0.71  CALCIUM  --  8.1*  MG 2.0  --   AST 368* 454*  ALT 149* 170*  ALKPHOS 72 67  BILITOT 0.8 0.8   ------------------------------------------------------------------------------------------------------------------  Cardiac Enzymes  Recent Labs Lab 05/10/15 1847  TROPONINI <0.03   ------------------------------------------------------------------------------------------------------------------  RADIOLOGY:  Dg Shoulder Right  05/10/2015   CLINICAL DATA:  Found at home by daughter confused altered mental status bruising over right shoulder  EXAM: RIGHT SHOULDER - 2+ VIEW  COMPARISON:  None.  FINDINGS: There is no evidence of fracture or dislocation. There is no evidence of arthropathy or other focal bone abnormality. Soft tissues are unremarkable.  IMPRESSION: Negative.   Electronically Signed   By: Skipper Cliche M.D.   On: 05/10/2015 20:51   Ct Head Wo Contrast  05/10/2015   CLINICAL DATA:  Patient confused and altered speech  EXAM: CT HEAD WITHOUT CONTRAST  TECHNIQUE: Contiguous axial images were obtained from the base of the skull through the vertex without intravenous contrast.  COMPARISON:  04/09/2015  FINDINGS: Normal attenuation with no abnormalities to suggest mass infarct hemorrhage or extra-axial fluid. No hydrocephalus. Tiny calcified meningioma left posterior fossa stable. Calvarium intact. No significant inflammatory change in the visualized portions of the sinuses.  IMPRESSION: No acute abnormalities.   Electronically Signed   By: Skipper Cliche M.D.   On: 05/10/2015 20:31   Dg Hip Unilat With Pelvis 2-3 Views Right  05/10/2015   CLINICAL DATA:  Found at home by daughter confused altered  mental status, bruising right hip region  EXAM: DG HIP (WITH OR WITHOUT PELVIS) 2-3V RIGHT  COMPARISON:  None.  FINDINGS: Pelvic bones intact. No evidence of right femur fracture or dislocation. Mild bilateral hip arthritis.   IMPRESSION: No acute findings   Electronically Signed   By: Skipper Cliche M.D.   On: 05/10/2015 20:49     ASSESSMENT AND PLAN:   54 year old Caucasian female history of hyperlipidemia unspecified PTSD presenting after being found on the floor in an altered state.  * Acute encephalopathy - seems more of psychosis Etiology unclear. Overdose should have cleared by now. No CVA. Ammonia, TSH normal. Will request Psychiatry to see patient.  * TCA overdose? Unclear if she overdosed. Stop bicarb drip. QTc improved. D/C tele  * Acute rhabdomyolysis IVF. Monitor Cr. Repeat CK  * Transaminitis likely from rhabdomyolysis Will follow levels. likely from   * DVT prophylaxis Lovenox   All the records are reviewed and case discussed with Care Management/Social Workerr. Management plans discussed with the patient, family and they are in agreement.  CODE STATUS: FULL  DVT Prophylaxis: SCDs  TOTAL TIME TAKING CARE OF THIS PATIENT: 40 minutes.   POSSIBLE D/C IN 1-2 DAYS, DEPENDING ON CLINICAL CONDITION.  Discussed with family at bedside.   Hillary Bow R M.D on 05/12/2015 at 10:15 AM  Between 7am to 6pm - Pager - [phone]  After 6pm go to [email or web address] - password [email or web address] [facility]  Office  [phone]  CC: Primary care physician; Vista Mink, FNP
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
## 2015-05-12 NOTE — Care Management Important Message (Signed)
Important Message  Patient Details  Name: Tami Hopkins MRN: [id number] Date of Birth: [birth year]   Medicare Important Message Given:  Yes-second notification given    Darius Bump Allmond 05/12/2015, 10:23 AM

## 2015-05-12 NOTE — Care Management Note (Addendum)
Case Management Note  Patient Details  Name: Tami Hopkins MRN: [id number] Date of Birth: 29-Oct-1960  Subjective/Objective:                  Patient is unable to carry on a conversation due to current/new altered mental status.This RNCM received call from one of patient's daughter's Tami Hopkins [phone] expressing concern for patient's "drug abuse". She states that her mom "will just tremble sometimes and I've tried to explain that this could be withdrawal symptoms" but then states "her mom/patient is afraid to not take prescriptions because of PTSD". "I wish someone will help her". She states patient has 3 daughters "(Tami Hopkins, Tami Hopkins, and Tami Hopkins) and they all agree that patient is addicted to prescription drugs". She states her mom is usually alert and oriented and takes care of herself including the ability to drive. Tami Hopkins is not sure who her PCP is but that she gets her Rx filled at [facility]. [phone].I have requested list of Rx and name of writer from [facility]. It appears that prescriber is Dr. Threasa Alpha and Dr. Lorelee Market  with [facility] [phone]. Copy of meds filled at [facility] placed on patient's chart; RN updated. Tami Hopkins states that patient has a boyfriend "Tami Hopkins" that Tami Hopkins denies concern with drug or alcohol abuse. She states "Tami Hopkins is a good person and they don't see each other that often anyway".   Action/Plan: CSW updated. Psych consult pending. RNCM will continue to follow. I requested PT/OT/SLP from RN as patient is confused which is not her baseline per her daughter.    Expected Discharge Date:                  Expected Discharge Plan:     In-House Referral:     Discharge planning Services     Post Acute Care Choice:    Choice offered to:  Adult Children Tami Hopkins)  DME Arranged:    DME Agency:     HH Arranged:    HH Agency:     Status of Service:  In process, will continue to follow  Medicare Important Message Given:  Yes-second  notification given Date Medicare IM Given:    Medicare IM give by:    Date Additional Medicare IM Given:    Additional Medicare Important Message give by:     If discussed at [place] of Stay Meetings, dates discussed:    Additional Comments:  Marshell Garfinkel, RN 05/12/2015, 1:59 PM

## 2015-05-12 NOTE — Evaluation (Signed)
Clinical/Bedside Swallow Evaluation Patient Details  Name: Tami Hopkins MRN: [id number] Date of Birth: [birth year]  Today's Date: 05/12/2015 Time: SLP Start Time (ACUTE ONLY): 1205 SLP Stop Time (ACUTE ONLY): 1255 SLP Time Calculation (min) (ACUTE ONLY): 50 min  Past Medical History:  Past Medical History  Diagnosis Date  . PTSD (post-traumatic stress disorder)   . Scoliosis   . Hyperlipidemia   . Asthma    Past Surgical History:  Past Surgical History  Procedure Laterality Date  . Cesarean section    . Endometrial ablation      x3  . Abdominal hysterectomy     HPI:  Pt is a 54 y.o. female with a known history of hyperlipidemia unspecified, PTSD presenting with altered mental status. She is unable to provide any meaningful information given mental status/medical condition. History obtained from family member at bedside. States that she was in her usual state of health and last spoke to the day before admission, at that time she had no complaints. However one of the family members were unable to contact her today they went to check on her and found her lying in the garage on top of an exercise bike. She was babbling incoherently and appeared to have visual hallucinations thus brought to the hospital for further workup and evaluation. Of note she had a bottle of amitriptyline 150 mg by mouth daily filled on 04/30/2015, however the bottle was empty. During admission, pt continues to present w/ agitation and incoherent speech much of the time. She answered 1-2 direct questions if she like a certain food/drink. She was unable to follow any other instructions or answer any other questions.    Assessment / Plan / Recommendation Clinical Impression  Pt appears to safely tolerate trials of thin liquids and solids w/ no overt s/s of aspriation and no significant oral phase deficits noted during bolus management of the oral phase; pt cleared appropriately b/t trials. Pt appears at reduced risk  for aspiration when following general aspiration precautions; monitoring during the meals sec. to pt's Impulsivity at this time. Pt does require feeding assistance and supervision sec. to Cognitive status at this time d/t encephalopathy; Sitter in room. Rec. continue current diet as ordered. No skilled ST services indicated for dysphagia at this time; will need further f/u for Cognitive assessment when pt's Neurological status improves to follow through w/ evaluation. NSG updated. ST will f/u as indicated and when appropriate.     Aspiration Risk   (reduced)    Diet Recommendation Age appropriate regular solids;Thin   Medication Administration: Whole meds with puree (if cannot tolerate w/ liquids w/ NSG) Compensations: Slow rate;Small sips/bites (reduce distractions)    Other  Recommendations Recommended Consults:  (TBD) Oral Care Recommendations: Oral care BID;Staff/trained caregiver to provide oral care   Follow Up Recommendations       Frequency and Duration        Pertinent Vitals/Pain None indicated during session    SLP Swallow Goals  n/a at this time   Swallow Study Prior Functional Status   resided at home independently    General Date of Onset: 05/10/15 Other Pertinent Information: Pt is a 54 y.o. female with a known history of hyperlipidemia unspecified, PTSD presenting with altered mental status. She is unable to provide any meaningful information given mental status/medical condition. History obtained from family member at bedside. States that she was in her usual state of health and last spoke to the day before admission, at that time she had no  complaints. However one of the family members were unable to contact her today they went to check on her and found her lying in the garage on top of an exercise bike. She was babbling incoherently and appeared to have visual hallucinations thus brought to the hospital for further workup and evaluation. Of note she had a bottle of  amitriptyline 150 mg by mouth daily filled on 04/30/2015, however the bottle was empty. During admission, pt continues to present w/ agitation and incoherent speech much of the time. She answered 1-2 direct questions if she like a certain food/drink. She was unable to follow any other instructions or answer any other questions.  Type of Study: Bedside swallow evaluation Previous Swallow Assessment: none Diet Prior to this Study: Regular;Thin liquids (suspected) Temperature Spikes Noted: No Respiratory Status: Room air History of Recent Intubation: No Behavior/Cognition: Alert;Confused;Agitated;Impulsive;Requires cueing;Doesn't follow directions;Distractible Oral Cavity - Dentition: Adequate natural dentition/normal for age Self-Feeding Abilities: Total assist (too distracted and confused) Patient Positioning: Upright in bed Baseline Vocal Quality: Normal (rushed, pressured speech) Volitional Cough: Cognitively unable to elicit Volitional Swallow: Able to elicit    Oral/Motor/Sensory Function Overall Oral Motor/Sensory Function:  (unable to assess; all lingual/labial movements appeared wfl)   Ice Chips Ice chips: Not tested   Thin Liquid Thin Liquid: Within functional limits Presentation: Straw (assisted w/ feeding; ~4 ozs)    Nectar Thick Nectar Thick Liquid: Not tested   Honey Thick Honey Thick Liquid: Not tested   Puree Puree: Within functional limits Presentation: Spoon (fed; 2 trials)   Solid   GO    Solid: Within functional limits Presentation: Self Fed;Spoon (fed also; 10 trials+)      Tami Kenner, MS, CCC-SLP Hopkins,Tami 05/12/2015,12:56 PM

## 2015-05-13 DIAGNOSIS — F19921 Other psychoactive substance use, unspecified with intoxication with delirium: Secondary | ICD-10-CM

## 2015-05-13 LAB — COMPREHENSIVE METABOLIC PANEL
ALBUMIN: 3.3 g/dL — AB (ref 3.5–5.0)
ALT: 179 U/L — ABNORMAL HIGH (ref 14–54)
AST: 319 U/L — AB (ref 15–41)
Alkaline Phosphatase: 65 U/L (ref 38–126)
Anion gap: 9 (ref 5–15)
BUN: 9 mg/dL (ref 6–20)
CHLORIDE: 104 mmol/L (ref 101–111)
CO2: 26 mmol/L (ref 22–32)
CREATININE: 0.72 mg/dL (ref 0.44–1.00)
Calcium: 8.5 mg/dL — ABNORMAL LOW (ref 8.9–10.3)
GFR calc non Af Amer: >60 mL/min (ref >60)
Glucose, Bld: 97 mg/dL (ref 65–99)
Potassium: 3.3 mmol/L — ABNORMAL LOW (ref 3.5–5.1)
Sodium: 139 mmol/L (ref 135–145)
TOTAL PROTEIN: 6.1 g/dL — AB (ref 6.5–8.1)
Total Bilirubin: 0.8 mg/dL (ref 0.3–1.2)

## 2015-05-13 LAB — CK: Total CK: 15096 U/L — ABNORMAL HIGH (ref 38–234)

## 2015-05-13 MED ORDER — POTASSIUM CHLORIDE CRYS ER 20 MEQ PO TBCR
40.0000 meq | EXTENDED_RELEASE_TABLET | ORAL | Status: AC
  Administered 2015-05-13 (×2): 40 meq via ORAL
  Filled 2015-05-13 (×2): qty 2

## 2015-05-13 MED ORDER — SODIUM CHLORIDE 0.9 % IV SOLN
INTRAVENOUS | Status: DC
Start: 2015-05-13 — End: 2015-05-14

## 2015-05-13 MED ORDER — LIDOCAINE VISCOUS 2 % MT SOLN
15.0000 mL | OROMUCOSAL | Status: DC | PRN
  Administered 2015-05-13 – 2015-05-14 (×2): 15 mL via OROMUCOSAL
  Filled 2015-05-13 (×3): qty 15

## 2015-05-13 NOTE — Progress Notes (Signed)
Patient combative and physically trying to get out of bed. Patient attempting to pull out IV,and foley cath. Haldol giving PRN see mar.

## 2015-05-13 NOTE — Progress Notes (Signed)
Code 300 paged staff in the room with patient. Patient increasing anxiety and combative. Dr.Reddy notified no new orders giving.Nursing Supervisor  paged Dr. Reece Levy new orders giving,PRN medication administered see MAR. Nursing will continue to monitor.

## 2015-05-13 NOTE — Evaluation (Signed)
Physical Therapy Evaluation Patient Details Name: Tami Hopkins MRN: [id number] DOB: September 09, 1961 Today's Date: 05/13/2015   History of Present Illness  54 yo female with onset of drug overdose for mood altering meds, now is having hallucinations with enceophalopathy,  and sitter with pt.   On catheter and with family  Clinical Impression  Pt is having difficulty with moving on RW and used HHA to get her gait more under control.  Behavior seems to be the greatest component and will need to be worked with by PT and nursing to facilitate her stability and remove her obstacles to going to behavioral med.    Follow Up Recommendations Other (comment) (behavioral med)    Equipment Recommendations  None recommended by PT    Recommendations for Other Services       Precautions / Restrictions Precautions Precautions: Fall Precaution Comments: Auditory and visual hallucinations Restrictions Weight Bearing Restrictions: No      Mobility  Bed Mobility Overal bed mobility: Needs Assistance Bed Mobility: Supine to Sit;Sit to Supine     Supine to sit: Mod assist Sit to supine: Mod assist   General bed mobility comments: Assistance is to direct her as pt does not consistently follow instructions and is unsafe in her choices of  support.  Will recommend PT keep her to walk but needs to be encouraged by nursing to walk with 2 person assist as well  Transfers Overall transfer level: Needs assistance Equipment used: Rolling walker (2 wheeled);1 person hand held assist;2 person hand held assist Transfers: Sit to/from [facility] Sit to Stand: Min assist;+2 physical assistance;+2 safety/equipment;Mod assist Stand pivot transfers: Mod assist;+2 physical assistance;+2 safety/equipment       General transfer comment: unfocused efforts make pt need 2 person assist, otherwise could be possibly even independent on RW  Ambulation/Gait Ambulation/Gait assistance: Mod assist;+2  physical assistance;+2 safety/equipment Ambulation Distance (Feet): 70 Feet Assistive device: Rolling walker (2 wheeled);1 person hand held assist;2 person hand held assist Gait Pattern/deviations: Step-through pattern;Ataxic Gait velocity: reduced Gait velocity interpretation: Below normal speed for age/gender General Gait Details: Pt leans backward with no warning and then shifts in standing laterally or back.  Very inconsistent presentation and can stand with little support for short times as well  Financial trader Rankin (Stroke Patients Only)       Balance Overall balance assessment: Needs assistance Sitting-balance support: Feet supported Sitting balance-Leahy Scale: Poor   Postural control: Posterior lean Standing balance support: Bilateral upper extremity supported Standing balance-Leahy Scale: Poor                               Pertinent Vitals/Pain Pain Assessment: No/denies pain    Home Living Family/patient expects to be discharged to:: Unsure Living Arrangements: Alone                    Prior Function Level of Independence: Independent               Hand Dominance        Extremity/Trunk Assessment   Upper Extremity Assessment: Overall WFL for tasks assessed           Lower Extremity Assessment: Overall WFL for tasks assessed      Cervical / Trunk Assessment: Normal  Communication   Communication: Expressive difficulties;Other (comment) (confused and talking about things related to  hallucinations)  Cognition Arousal/Alertness: Awake/alert Behavior During Therapy: Agitated;Anxious;Impulsive Overall Cognitive Status: Impaired/Different from baseline Area of Impairment: Orientation;Attention;Memory;Following commands;Safety/judgement;Awareness;Problem solving Orientation Level: Situation;Time Current Attention Level: Divided Memory: Decreased recall of precautions;Decreased  short-term memory Following Commands: Follows one step commands inconsistently Safety/Judgement: Decreased awareness of safety;Decreased awareness of deficits Awareness: Intellectual;Anticipatory Problem Solving: Slow processing;Decreased initiation;Difficulty sequencing;Requires verbal cues;Requires tactile cues General Comments: Pt is unsafe with walker and tries to carry it or use incorrectly despite corrections.  HHA more successful but still releases second person's hand and reaches for wall    General Comments General comments (skin integrity, edema, etc.): Pt has a non organic basis for her balance changes and could benefit from repetition with both PT and nursing and with both together.    Exercises        Assessment/Plan    PT Assessment Patient needs continued PT services  PT Diagnosis Difficulty walking;Altered mental status   PT Problem List Decreased activity tolerance;Decreased balance;Decreased mobility;Decreased coordination;Decreased cognition;Decreased knowledge of use of DME;Decreased safety awareness  PT Treatment Interventions DME instruction;Gait training;Functional mobility training;Therapeutic activities;Therapeutic exercise;Balance training;Neuromuscular re-education;Cognitive remediation;Patient/family education   PT Goals (Current goals can be found in the Care Plan section) Acute Rehab PT Goals Patient Stated Goal: none stated PT Goal Formulation: With patient/family Time For Goal Achievement: 05/27/15 Potential to Achieve Goals: Good    Frequency Min 2X/week   Barriers to discharge Inaccessible home environment unsafe general mobility with conffusion    Co-evaluation               End of Session Equipment Utilized During Treatment: Gait belt Activity Tolerance: No increased pain;Treatment limited secondary to agitation Patient left: in bed;with call bell/phone within reach;with family/visitor present;with nursing/sitter in room Nurse  Communication: Mobility status         Time: 0600-4599 PT Time Calculation (min) (ACUTE ONLY): 33 min   Charges:   PT Evaluation $Initial PT Evaluation Tier I: 1 Procedure PT Treatments $Gait Training: 8-22 mins   PT G CodesRamond Dial 05-31-15, 12:15 PM   Mee Hives, PT MS Acute Rehab Dept. Number: [phone] [id number] and [place] [phone]

## 2015-05-13 NOTE — Progress Notes (Signed)
Patient is confused sitter at bedside. Patient has multiple family members at bedside after visiting hours. Patient experiencing intermittent visual and physical halucations. Patient pulled out IV. IV restarted. Family members ask to quiet down and leave to decrease stimulation. Family upset. Per family request wanted all bed rails up on bed. Patient teaching giving to family about restraints and need of doctors order. Family angry and using profane language. Nursing will continue to monitor. One family member at bedside during hs.

## 2015-05-13 NOTE — Progress Notes (Signed)
Spoke to Dr. Darvin Neighbours & he said to d/c NS & K+ & start NS at 163mL/hr due to the burning of the potassium.  Made 2 attempts to start another IV; was unsuccessful

## 2015-05-13 NOTE — Progress Notes (Signed)
Patient continues to be confused, however pt's family states that she is a little better. Her agitation has subsided and she is more calm at this time. She has a 1:1 sitter which helps.  Was able to re-start her K+ but needed to lower to 68mL/hr due to burning. She is now tolerating.  Foley in place & patent. Continue to monitor patient.
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
## 2015-05-13 NOTE — Evaluation (Signed)
Occupational Therapy Evaluation Patient Details Name: Tami Hopkins MRN: [id number] DOB: 17-Sep-1961 Today's Date: 05/13/2015    History of Present Illness This patient is a 54 year old female with onset of drug overdose for mood altering meds, now is having hallucinations with enceophalopathy. Patient has a Actuary.   She has a catheter.    Clinical Impression   Patient with some difficulty focusing, is able to follow one step directions sometimes. She needs assist with dressing as her coordination is off. She would benefit from Occupational Therapy for ADL/functioal mobility training and safety training.    Follow Up Recommendations       Equipment Recommendations       Recommendations for Other Services       Precautions / Restrictions Precautions Precautions: Fall Precaution Comments: Hallucinations Restrictions Weight Bearing Restrictions: No      Mobility Bed Mobility       General transfer comment: unfocused efforts make pt need 2 person assist, otherwise could be possibly even independent on RW    Balance Overall balance assessment: Needs assistance                                 ADL                                         General ADL Comments: Had been independent. Now unable to donn socks during this evaluation     Vision     Perception     Praxis      Pertinent Vitals/Pain Pain Assessment: No/denies pain     Hand Dominance     Extremity/Trunk Assessment Upper Extremity Assessment Upper Extremity Assessment:  (B UE 4/5)   Lower Extremity Assessment Lower Extremity Assessment: Defer to PT evaluation   Cervical / Trunk Assessment Cervical / Trunk Assessment: Normal   Communication Communication Communication:  (Confused, able to speek and answer some questions)   Cognition Arousal/Alertness: Awake/alert Behavior During Therapy: Anxious Overall Cognitive Status: Impaired/Different from baseline Area  of Impairment: Orientation;Attention;Memory;Following commands;Safety/judgement Orientation Level: Situation;Time Current Attention Level: Divided Memory: Decreased recall of precautions;Decreased short-term memory Following Commands: Follows one step commands inconsistently Safety/Judgement: Decreased awareness of safety;Decreased awareness of deficits Awareness: Intellectual;Anticipatory Problem Solving: Slow processing;Decreased initiation;Difficulty sequencing;Requires verbal cues;Requires tactile cues General Comments: Pt is unsafe with walker as per Physical Therapy.   General Comments       Exercises       Shoulder Instructions      Home Living  (Lives in 2 story appartment) with bedroom upstairs and a bathroom down stairs. Living Arrangements: Alone                                      Prior Functioning/Environment Level of Independence: Independent             OT Diagnosis: Altered mental status;Ataxia   OT Problem List: Decreased activity tolerance;Impaired balance (sitting and/or standing);Decreased safety awareness;Decreased cognition;Decreased coordination   OT Treatment/Interventions: Self-care/ADL training    OT Goals(Current goals can be found in the care plan section) Acute Rehab OT Goals Patient Stated Goal: Unable to state Potential to Achieve Goals: Good  OT Frequency: Min 1X/week   Barriers to D/C:  Co-evaluation              End of Session    Activity Tolerance:   Patient left: in bed;with call bell/phone within reach;with nursing/sitter in room   Time: 1320-1335 OT Time Calculation (min): 15 min Charges:  OT General Charges $OT Visit: 1 Procedure OT Evaluation $Initial OT Evaluation Tier I: 1 Procedure G-Codes:    Sharon Mt 2015-05-23, 1:43 PM

## 2015-05-13 NOTE — Progress Notes (Signed)
[facility] [place] NAME: Tami Hopkins    MR#:  [id number]  DATE OF BIRTH:  1960-12-16  SUBJECTIVE:  CHIEF COMPLAINT:   Chief Complaint  Patient presents with  . Altered Mental Status  More awake. Not oriented to time Family and Sitter at bedside.  REVIEW OF SYSTEMS:    ROS Unobtainable due to confusion   DRUG ALLERGIES:   Allergies  Allergen Reactions  . Penicillins Anaphylaxis    Any "cillins"  . Sulfa Antibiotics Anaphylaxis    VITALS:  Blood pressure 114/54, pulse 98, temperature 99.5 F (37.5 C), temperature source Oral, resp. rate 18, weight 78.064 kg (172 lb 1.6 oz), SpO2 96 %.  PHYSICAL EXAMINATION:   Physical Exam  GENERAL:  54 y.o.-year-old patient lying in the bed with no acute distress.  EYES: Pupils equal, round, reactive to light and accommodation. No scleral icterus. Extraocular muscles intact.  HEENT: Head atraumatic, normocephalic. Oropharynx and nasopharynx clear.  NECK:  Supple, no jugular venous distention. No thyroid enlargement, no tenderness.  LUNGS: Normal breath sounds bilaterally, no wheezing, rales, rhonchi. No use of accessory muscles of respiration.  CARDIOVASCULAR: S1, S2 normal. No murmurs, rubs, or gallops.  ABDOMEN: Soft, nontender, nondistended. Bowel sounds present. No organomegaly or mass.  EXTREMITIES: No cyanosis, clubbing or edema b/l.    NEUROLOGIC: Cranial nerves II through XII are intact. No focal Motor or sensory deficits b/l.   PSYCHIATRIC: The patient is alert and awake. Confused. Follows commands SKIN: No obvious rash, lesion, or ulcer. Bruises right shoulder.   LABORATORY PANEL:   CBC  Recent Labs Lab 05/11/15 0130  WBC 12.0*  HGB 12.1  HCT 36.1  PLT 223   ------------------------------------------------------------------------------------------------------------------  Chemistries   Recent Labs Lab 05/11/15 0918  05/13/15 0745  NA  --   < > 139   K 2.8*  < > 3.3*  CL  --   < > 104  CO2  --   < > 26  GLUCOSE  --   < > 97  BUN  --   < > 9  CREATININE  --   < > 0.72  CALCIUM  --   < > 8.5*  MG 2.0  --   --   AST 368*  < > 319*  ALT 149*  < > 179*  ALKPHOS 72  < > 65  BILITOT 0.8  < > 0.8  < > = values in this interval not displayed. ------------------------------------------------------------------------------------------------------------------  Cardiac Enzymes  Recent Labs Lab 05/10/15 1847  TROPONINI <0.03   ------------------------------------------------------------------------------------------------------------------  RADIOLOGY:  No results found.   ASSESSMENT AND PLAN:   54 year old Caucasian female history of hyperlipidemia unspecified PTSD presenting after being found on the floor in an altered state.  * Acute encephalopathy - Due to overdose. Improving  No CVA. Ammonia, TSH normal. Appreciate psychiatry help  * TCA overdose Unclear if she overdosed. Stopped bicarb drip. QTc improved. D/C tele  * Acute rhabdomyolysis IVF. Monitor Cr. Repeat CK in AM  * Transaminitis likely from rhabdomyolysis Will follow levels. Improving  * DVT prophylaxis Lovenox  All the records are reviewed and case discussed with Care Management/Social Workerr. Management plans discussed with the patient, family and they are in agreement.  CODE STATUS: FULL  DVT Prophylaxis: SCDs  TOTAL TIME TAKING CARE OF THIS PATIENT: 40 minutes.   POSSIBLE D/C IN 1-2 DAYS, DEPENDING ON CLINICAL CONDITION.  Discussed with family at bedside. They are concerned about she  going home alone. Requested PT to see pt.   Hillary Bow R M.D on 05/13/2015 at 11:28 AM  Between 7am to 6pm - Pager - [phone]  After 6pm go to [email or web address] - password [email or web address] [facility]  Office  [phone]  CC: Primary care physician; Vista Mink, FNP

## 2015-05-14 LAB — COMPREHENSIVE METABOLIC PANEL
ALT: 148 U/L — ABNORMAL HIGH (ref 14–54)
ANION GAP: 8 (ref 5–15)
AST: 170 U/L — ABNORMAL HIGH (ref 15–41)
Albumin: 3.3 g/dL — ABNORMAL LOW (ref 3.5–5.0)
Alkaline Phosphatase: 70 U/L (ref 38–126)
BUN: 11 mg/dL (ref 6–20)
CALCIUM: 8.6 mg/dL — AB (ref 8.9–10.3)
CHLORIDE: 105 mmol/L (ref 101–111)
CO2: 25 mmol/L (ref 22–32)
CREATININE: 0.72 mg/dL (ref 0.44–1.00)
GFR calc non Af Amer: >60 mL/min (ref >60)
Glucose, Bld: 118 mg/dL — ABNORMAL HIGH (ref 65–99)
Potassium: 3.7 mmol/L (ref 3.5–5.1)
Sodium: 138 mmol/L (ref 135–145)
Total Bilirubin: 0.7 mg/dL (ref 0.3–1.2)
Total Protein: 5.9 g/dL — ABNORMAL LOW (ref 6.5–8.1)

## 2015-05-14 LAB — C DIFFICILE QUICK SCREEN W PCR REFLEX
C Diff antigen: NEGATIVE
C Diff interpretation: NEGATIVE
C Diff toxin: NEGATIVE

## 2015-05-14 LAB — CK: Total CK: 5683 U/L — ABNORMAL HIGH (ref 38–234)

## 2015-05-14 MED ORDER — ONDANSETRON HCL 4 MG/2ML IJ SOLN
4.0000 mg | Freq: Four times a day (QID) | INTRAMUSCULAR | Status: DC | PRN
  Administered 2015-05-14: 4 mg via INTRAVENOUS
  Filled 2015-05-14: qty 2

## 2015-05-14 NOTE — Progress Notes (Signed)
Spoke to Gouru MD, new orders received
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
## 2015-05-14 NOTE — Progress Notes (Signed)
Paged MD for pt c/o nausea and no medication ordered

## 2015-05-14 NOTE — Discharge Summary (Signed)
Tami Hopkins at [place] NAME: Tami Hopkins    MR#:  [id number]  DATE OF BIRTH:  1961-03-26  DATE OF ADMISSION:  05/10/2015 ADMITTING PHYSICIAN: Tami Butte, MD  DATE OF DISCHARGE: 05/14/2015 PRIMARY CARE PHYSICIAN: Tami Mink, FNP    ADMISSION DIAGNOSIS:  Non-traumatic rhabdomyolysis [M62.82]  DISCHARGE DIAGNOSIS:  Principal Problem:   Delirium, drug-induced Active Problems:   Rhabdomyolysis   Encephalopathy acute   Posttraumatic stress disorder   SECONDARY DIAGNOSIS:   Past Medical History  Diagnosis Date  . PTSD (post-traumatic stress disorder)   . Scoliosis   . Hyperlipidemia   . Asthma     HOSPITAL COURSE:  54 year old female with a history of hyperlipidemia and PTSD presented after being found on the floor and altered mental state. For further details please refer to the H&P.  1. Acute encephalopathy: Secondary to overdose. Patient's mental status has improved. Patient's ammonia and TSH were normal. There is no indication for neurological etiology for acute encephalopathy. Appreciate psychiatry consultation.  2. TCA overdose: Cystitis. To be intentional. She was initially placed on a bicarbonate drip which was discontinued. Her QTc interval had improved.  3. Acute rhabdomyolysis: Patient was found on the floor which is the etiology of her rhabdomyolysis. She was treated with IV fluids. Her electrolytes and creatinine are within normal limits at discharge. Her CK has improved.  4. Transaminitis: This is from rhabdomyolysis. Her LFTs are improving.  5. PTSD: Patient will require further psychiatric evaluation.   DISCHARGE CONDITIONS AND DIET:  Patient is being discharged in stable condition on a heart healthy diet  CONSULTS OBTAINED:  Dr. Weber Hopkins, Psychiatry  DRUG ALLERGIES:   Allergies  Allergen Reactions  . Penicillins Anaphylaxis    Any "cillins"  . Sulfa Antibiotics Anaphylaxis     DISCHARGE MEDICATIONS:   Current Discharge Medication List    CONTINUE these medications which have NOT CHANGED   Details  alprazolam (XANAX) 2 MG tablet Take 1 tablet by mouth 4 (four) times daily as needed for anxiety.     baclofen (LIORESAL) 10 MG tablet Take 10 mg by mouth 2 (two) times daily as needed for muscle spasms.    diclofenac sodium (VOLTAREN) 1 % GEL Apply 4 g topically 4 (four) times daily.    fluticasone (FLONASE) 50 MCG/ACT nasal spray Place 1 spray into both nostrils 2 (two) times daily.    fluticasone (FLOVENT HFA) 220 MCG/ACT inhaler Inhale 2 puffs into the lungs 2 (two) times daily. Do not eat or drink for one hour after dose    furosemide (LASIX) 40 MG tablet Take 1 tablet by mouth daily.    montelukast (SINGULAIR) 10 MG tablet Take 10 mg by mouth at bedtime.    PROAIR HFA 108 (90 BASE) MCG/ACT inhaler Inhale 2 puffs into the lungs every 4 (four) hours as needed.    risperiDONE (RISPERDAL) 1 MG tablet Take 1 tablet by mouth at bedtime.    traMADol (ULTRAM) 50 MG tablet Take 100 mg by mouth 4 (four) times daily as needed for moderate pain. 2 tablets 4 times a day PRN.    diphenoxylate-atropine (LOMOTIL) 2.5-0.025 MG per tablet Take 1 tablet by mouth 4 (four) times daily as needed for diarrhea or loose stools.    lactase (LACTAID) 3000 UNITS tablet Take by mouth 3 (three) times daily with meals.    omeprazole (PRILOSEC) 40 MG capsule Take 40 mg by mouth daily.      STOP taking these  medications     amitriptyline (ELAVIL) 150 MG tablet      buPROPion (WELLBUTRIN XL) 150 MG 24 hr tablet      doxepin (SINEQUAN) 50 MG capsule      meclizine (ANTIVERT) 25 MG tablet      promethazine (PHENERGAN) 25 MG tablet      diphenhydrAMINE (BENADRYL) 25 MG tablet               Today   CHIEF COMPLAINT:  Patient is doing well this morning. Patient mental status is at her baseline.   VITAL SIGNS:  Blood pressure 115/80, pulse 95, temperature 98.9  F (37.2 C), temperature source Oral, resp. rate 18, weight 78.064 kg (172 lb 1.6 oz), SpO2 95 %.   REVIEW OF SYSTEMS:  Review of Systems  Constitutional: Negative for fever, chills and malaise/fatigue.  HENT: Negative for sore throat.   Eyes: Negative for blurred vision.  Respiratory: Negative for cough, hemoptysis, shortness of breath and wheezing.   Cardiovascular: Negative for chest pain, palpitations and leg swelling.  Gastrointestinal: Negative for nausea, vomiting, abdominal pain, diarrhea and blood in stool.  Genitourinary: Negative for dysuria.  Musculoskeletal: Negative for back pain.  Neurological: Negative for dizziness, tremors and headaches.  Endo/Heme/Allergies: Does not bruise/bleed easily.     PHYSICAL EXAMINATION:  GENERAL:  54 y.o.-year-old patient lying in the bed with no acute distress.  NECK:  Supple, no jugular venous distention. No thyroid enlargement, no tenderness.  LUNGS: Normal breath sounds bilaterally, no wheezing, rales,rhonchi  No use of accessory muscles of respiration.  CARDIOVASCULAR: S1, S2 normal. No murmurs, rubs, or gallops.  ABDOMEN: Soft, non-tender, non-distended. Bowel sounds present. No organomegaly or mass.  EXTREMITIES: No pedal edema, cyanosis, or clubbing.  PSYCHIATRIC: The patient is alert and oriented x 3.  SKIN: No obvious rash, lesion, or ulcer.   DATA REVIEW:   CBC  Recent Labs Lab 05/11/15 0130  WBC 12.0*  HGB 12.1  HCT 36.1  PLT 223    Chemistries   Recent Labs Lab 05/11/15 0918  05/14/15 0433  NA  --   < > 138  K 2.8*  < > 3.7  CL  --   < > 105  CO2  --   < > 25  GLUCOSE  --   < > 118*  BUN  --   < > 11  CREATININE  --   < > 0.72  CALCIUM  --   < > 8.6*  MG 2.0  --   --   AST 368*  < > 170*  ALT 149*  < > 148*  ALKPHOS 72  < > 70  BILITOT 0.8  < > 0.7  < > = values in this interval not displayed.  Cardiac Enzymes  Recent Labs Lab 05/10/15 1847  TROPONINI <0.03    Microbiology Results   @MICRORSLT48 @  RADIOLOGY:  No results found.    Management plans discussed with the patient and she is in agreement. Stable for discharge   Patient should follow up with PCP in one week  CODE STATUS:     Code Status Orders        Start     Ordered   05/10/15 2248  Full code   Continuous     05/10/15 2249      TOTAL TIME TAKING CARE OF THIS PATIENT: 35 minutes.    Tami Hopkins.D on 05/14/2015 at 11:41 AM  Between 7am to 6pm - Pager - [phone] After  6pm go to [email or web address] - password [email or web address] [facility]  Office  [phone]  CC: Primary care physician; Tami Mink, FNP

## 2015-05-14 NOTE — Progress Notes (Signed)
Accepted report late in shift.   Found patient to have had numerous bowel movements yesterday and today.  Notified Sitter in room to assist in getting sample for testing.  Supplies in room for next collection.

## 2015-05-14 NOTE — Progress Notes (Signed)
Patient's condition seems improved, but she is still confused. Was seen by Dr. Weber Cooks this afternoon and he would like her to stay another night on ortho, does not think she is right for inpatient BH. He paged Dr. Benjie Karvonen & still waiting to hear if she will be d/c'd home tonight or not.

## 2015-05-15 LAB — CULTURE, BLOOD (ROUTINE X 2)
CULTURE: NO GROWTH
Culture: NO GROWTH

## 2015-05-15 NOTE — Discharge Summary (Signed)
[facility] at [place] NAME: Tami Hopkins    MR#:  [id number]  DATE OF BIRTH:  Apr 28, 1961  DATE OF ADMISSION:  05/10/2015 ADMITTING PHYSICIAN: Lytle Butte, MD  DATE OF DISCHARGE: 05/15/2015 PRIMARY CARE PHYSICIAN: Vista Mink, FNP    ADMISSION DIAGNOSIS:  Non-traumatic rhabdomyolysis [M62.82]  DISCHARGE DIAGNOSIS:  Principal Problem:   Delirium, drug-induced Active Problems:   Rhabdomyolysis   Encephalopathy acute   Posttraumatic stress disorder   SECONDARY DIAGNOSIS:   Past Medical History  Diagnosis Date  . PTSD (post-traumatic stress disorder)   . Scoliosis   . Hyperlipidemia   . Asthma     HOSPITAL COURSE:  54 year old female with a history of hyperlipidemia and PTSD presented after being found on the floor and altered mental state. For further details please refer to the H&P.  1. Acute encephalopathy: Secondary to overdose. Patient's mental status has improved. Patient's ammonia and TSH were normal. There is no indication for neurological etiology for acute encephalopathy. Appreciate psychiatry consultation.  2. TCA overdose:This was unintentional. She was initially placed on a bicarbonate drip which was discontinued. Her QTc interval had improved.  3. Acute rhabdomyolysis: Patient was found on the floor which is the etiology of her rhabdomyolysis. She was treated with IV fluids. Her electrolytes and creatinine are within normal limits at discharge. Her CK has improved.  4. Transaminitis: This is from rhabdomyolysis. Her LFTs are improving.  5. PTSD: Patient will require further psychiatric evaluation.  6. Hypokalemia: repleted as needed  DISCHARGE CONDITIONS AND DIET:  Patient is being discharged in stable condition on a heart healthy diet  CONSULTS OBTAINED:  Dr. Weber Cooks, Psychiatry  DRUG ALLERGIES:   Allergies  Allergen Reactions  . Penicillins Anaphylaxis    Any "cillins"  . Sulfa  Antibiotics Anaphylaxis    DISCHARGE MEDICATIONS:   Current Discharge Medication List    CONTINUE these medications which have NOT CHANGED   Details  alprazolam (XANAX) 2 MG tablet Take 1 tablet by mouth 4 (four) times daily as needed for anxiety.     baclofen (LIORESAL) 10 MG tablet Take 10 mg by mouth 2 (two) times daily as needed for muscle spasms.    diclofenac sodium (VOLTAREN) 1 % GEL Apply 4 g topically 4 (four) times daily.    fluticasone (FLONASE) 50 MCG/ACT nasal spray Place 1 spray into both nostrils 2 (two) times daily.    fluticasone (FLOVENT HFA) 220 MCG/ACT inhaler Inhale 2 puffs into the lungs 2 (two) times daily. Do not eat or drink for one hour after dose    furosemide (LASIX) 40 MG tablet Take 1 tablet by mouth daily.    montelukast (SINGULAIR) 10 MG tablet Take 10 mg by mouth at bedtime.    PROAIR HFA 108 (90 BASE) MCG/ACT inhaler Inhale 2 puffs into the lungs every 4 (four) hours as needed.    risperiDONE (RISPERDAL) 1 MG tablet Take 1 tablet by mouth at bedtime.    traMADol (ULTRAM) 50 MG tablet Take 100 mg by mouth 4 (four) times daily as needed for moderate pain. 2 tablets 4 times a day PRN.    diphenoxylate-atropine (LOMOTIL) 2.5-0.025 MG per tablet Take 1 tablet by mouth 4 (four) times daily as needed for diarrhea or loose stools.    lactase (LACTAID) 3000 UNITS tablet Take by mouth 3 (three) times daily with meals.    omeprazole (PRILOSEC) 40 MG capsule Take 40 mg by mouth daily.  STOP taking these medications     amitriptyline (ELAVIL) 150 MG tablet      buPROPion (WELLBUTRIN XL) 150 MG 24 hr tablet      doxepin (SINEQUAN) 50 MG capsule      meclizine (ANTIVERT) 25 MG tablet      promethazine (PHENERGAN) 25 MG tablet      diphenhydrAMINE (BENADRYL) 25 MG tablet               Today   CHIEF COMPLAINT:  Patient is doing well this morning. Patient mental status is at her baseline. No issues over night  VITAL SIGNS:  Blood  pressure 110/55, pulse 96, temperature 98.6 F (37 C), temperature source Oral, resp. rate 16, weight 78.064 kg (172 lb 1.6 oz), SpO2 96 %.   REVIEW OF SYSTEMS:  Review of Systems  Constitutional: Negative for fever, chills and malaise/fatigue.  HENT: Negative for sore throat.   Eyes: Negative for blurred vision.  Respiratory: Negative for cough, hemoptysis, shortness of breath and wheezing.   Cardiovascular: Negative for chest pain, palpitations and leg swelling.  Gastrointestinal: Negative for nausea, vomiting, abdominal pain, diarrhea and blood in stool.  Genitourinary: Negative for dysuria.  Musculoskeletal: Negative for back pain.  Neurological: Negative for dizziness, tremors and headaches.  Endo/Heme/Allergies: Does not bruise/bleed easily.     PHYSICAL EXAMINATION:  GENERAL:  54 y.o.-year-old patient lying in the bed with no acute distress.  NECK:  Supple, no jugular venous distention. No thyroid enlargement, no tenderness.  LUNGS: Normal breath sounds bilaterally, no wheezing, rales,rhonchi  No use of accessory muscles of respiration.  CARDIOVASCULAR: S1, S2 normal. No murmurs, rubs, or gallops.  ABDOMEN: Soft, non-tender, non-distended. Bowel sounds present. No organomegaly or mass.  EXTREMITIES: No pedal edema, cyanosis, or clubbing.  PSYCHIATRIC: The patient is alert and oriented x 3.  SKIN: No obvious rash, lesion, or ulcer.   DATA REVIEW:   CBC  Recent Labs Lab 05/11/15 0130  WBC 12.0*  HGB 12.1  HCT 36.1  PLT 223    Chemistries   Recent Labs Lab 05/11/15 0918  05/14/15 0433  NA  --   < > 138  K 2.8*  < > 3.7  CL  --   < > 105  CO2  --   < > 25  GLUCOSE  --   < > 118*  BUN  --   < > 11  CREATININE  --   < > 0.72  CALCIUM  --   < > 8.6*  MG 2.0  --   --   AST 368*  < > 170*  ALT 149*  < > 148*  ALKPHOS 72  < > 70  BILITOT 0.8  < > 0.7  < > = values in this interval not displayed.  Cardiac Enzymes  Recent Labs Lab 05/10/15 1847   TROPONINI <0.03    Microbiology Results  @MICRORSLT48 @  RADIOLOGY:  No results found.    Management plans discussed with the patient and she is in agreement. Stable for discharge   Patient should follow up with PCP in one week  CODE STATUS:     Code Status Orders        Start     Ordered   05/10/15 2248  Full code   Continuous     05/10/15 2249      TOTAL TIME TAKING CARE OF THIS PATIENT: 35 minutes.    Aspasia Rude M.D on 05/15/2015 at 7:52 AM  Between 7am to  6pm - Pager - [phone] After 6pm go to [email or web address] - password [email or web address] [facility]  Office  [phone]  CC: Primary care physician; Vista Mink, FNP

## 2015-05-15 NOTE — Progress Notes (Signed)
Patient being discharged to home this afternoon. IV removed, belongings packed. Family bedside to take her home. VSS at this time.

## 2015-05-15 NOTE — Progress Notes (Signed)
Tami Hopkins at [place] NAME: Tami Hopkins    MR#:  [id number]  DATE OF BIRTH:  07-23-1961  SUBJECTIVE:  Patient has improved with MS   REVIEW OF SYSTEMS:    Review of Systems  Constitutional: Negative for fever, chills and malaise/fatigue.  HENT: Negative for sore throat.   Eyes: Negative for blurred vision.  Respiratory: Negative for cough, hemoptysis, shortness of breath and wheezing.   Cardiovascular: Negative for chest pain, palpitations and leg swelling.  Gastrointestinal: Negative for nausea, vomiting, abdominal pain, diarrhea and blood in stool.  Genitourinary: Negative for dysuria.  Musculoskeletal: Negative for back pain.  Neurological: Negative for dizziness, tremors and headaches.  Endo/Heme/Allergies: Does not bruise/bleed easily.      DRUG ALLERGIES:   Allergies  Allergen Reactions  . Penicillins Anaphylaxis    Any "cillins"  . Sulfa Antibiotics Anaphylaxis    VITALS:  Blood pressure 110/55, pulse 96, temperature 98.6 F (37 C), temperature source Oral, resp. rate 16, weight 78.064 kg (172 lb 1.6 oz), SpO2 96 %.  PHYSICAL EXAMINATION:   Physical Exam  Constitutional: She is oriented to person, place, and time and well-developed, well-nourished, and in no distress. No distress.  HENT:  Head: Normocephalic.  Eyes: No scleral icterus.  Neck: Normal range of motion. Neck supple. No JVD present. No tracheal deviation present.  Cardiovascular: Normal rate, regular rhythm and normal heart sounds.  Exam reveals no gallop and no friction rub.   No murmur heard. Pulmonary/Chest: Effort normal and breath sounds normal. No respiratory distress. She has no wheezes. She has no rales. She exhibits no tenderness.  Abdominal: Soft. Bowel sounds are normal. She exhibits no distension and no mass. There is no tenderness. There is no rebound and no guarding.  Musculoskeletal: Normal range of motion. She exhibits no  edema.  Neurological: She is alert and oriented to person, place, and time.  Skin: Skin is warm. No rash noted. No erythema.  Psychiatric: Affect and judgment normal.      LABORATORY PANEL:   CBC  Recent Labs Lab 05/11/15 0130  WBC 12.0*  HGB 12.1  HCT 36.1  PLT 223   ------------------------------------------------------------------------------------------------------------------  Chemistries   Recent Labs Lab 05/11/15 0918  05/14/15 0433  NA  --   < > 138  K 2.8*  < > 3.7  CL  --   < > 105  CO2  --   < > 25  GLUCOSE  --   < > 118*  BUN  --   < > 11  CREATININE  --   < > 0.72  CALCIUM  --   < > 8.6*  MG 2.0  --   --   AST 368*  < > 170*  ALT 149*  < > 148*  ALKPHOS 72  < > 70  BILITOT 0.8  < > 0.7  < > = values in this interval not displayed. ------------------------------------------------------------------------------------------------------------------  Cardiac Enzymes  Recent Labs Lab 05/10/15 1847  TROPONINI <0.03   ------------------------------------------------------------------------------------------------------------------  RADIOLOGY:  No results found.   ASSESSMENT AND PLAN:   54 year old Caucasian female history of hyperlipidemia unspecified PTSD presenting after being found on the floor in an altered state.  * Acute encephalopathy - Due to overdose. Improved No CVA. Ammonia, TSH normal. Appreciate psychiatry help  * TCA overdose Does not appear to be suicidal  * Acute rhabdomyolysis Improved CPK  * Transaminitis likely from rhabdomyolysis improved * DVT prophylaxis Lovenox  Management plans discussed with the patient, family and they are in agreement. >50% care and coordination today speaking with family and Dr Weber Cooks and nursing   CODE STATUS: FULL  DVT Prophylaxis: SCDs  TOTAL TIME TAKING CARE OF THIS PATIENT: 25 minutes.   POSSIBLE D/C tomorrow, DEPENDING ON CLINICAL CONDITION.  Discussed with family at  bedside. They are concerned about she going home alone. Requested PT to see pt.   Arlene Brickel M.D on 05/15/2015 at 7:49 AM  Between 7am to 6pm - Pager - [phone]  After 6pm go to [email or web address] - password [email or web address] [facility]  Office  [phone]  CC: Primary care physician; Vista Mink, FNP

## 2015-05-15 NOTE — Progress Notes (Signed)
Rounds by Dr Benjie Karvonen , sitter order discontinued , Dr mody called Dr Weber Cooks and discussed plan , ptto be discharged after assn by Dr Weber Cooks
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
## 2015-07-07 ENCOUNTER — Ambulatory Visit: Payer: Medicare Other | Admitting: Licensed Clinical Social Worker

## 2015-07-11 ENCOUNTER — Ambulatory Visit: Payer: Medicare Other | Admitting: Licensed Clinical Social Worker

## 2015-07-13 ENCOUNTER — Ambulatory Visit: Payer: Medicare Other | Admitting: Licensed Clinical Social Worker

## 2015-07-14 ENCOUNTER — Ambulatory Visit: Payer: Medicare Other | Admitting: Licensed Clinical Social Worker

## 2015-07-18 DIAGNOSIS — J449 Chronic obstructive pulmonary disease, unspecified: Secondary | ICD-10-CM | POA: Insufficient documentation

## 2015-07-18 DIAGNOSIS — F4310 Post-traumatic stress disorder, unspecified: Secondary | ICD-10-CM | POA: Insufficient documentation

## 2015-07-18 DIAGNOSIS — F5105 Insomnia due to other mental disorder: Secondary | ICD-10-CM | POA: Insufficient documentation

## 2015-07-21 ENCOUNTER — Ambulatory Visit: Payer: Medicare Other | Admitting: Licensed Clinical Social Worker

## 2015-07-22 ENCOUNTER — Ambulatory Visit: Payer: Medicare Other | Admitting: Licensed Clinical Social Worker

## 2015-07-26 ENCOUNTER — Other Ambulatory Visit: Payer: Self-pay | Admitting: Family Medicine

## 2015-07-26 DIAGNOSIS — Z1231 Encounter for screening mammogram for malignant neoplasm of breast: Secondary | ICD-10-CM

## 2015-07-27 ENCOUNTER — Ambulatory Visit (INDEPENDENT_AMBULATORY_CARE_PROVIDER_SITE_OTHER): Payer: Medicare HMO | Admitting: Licensed Clinical Social Worker

## 2015-07-27 DIAGNOSIS — F4310 Post-traumatic stress disorder, unspecified: Secondary | ICD-10-CM | POA: Diagnosis not present
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
## 2015-08-16 ENCOUNTER — Ambulatory Visit: Payer: Medicare Other | Admitting: Psychiatry

## 2015-08-18 ENCOUNTER — Ambulatory Visit (INDEPENDENT_AMBULATORY_CARE_PROVIDER_SITE_OTHER): Payer: Medicare HMO | Admitting: Psychiatry

## 2015-08-18 ENCOUNTER — Encounter: Payer: Self-pay | Admitting: Psychiatry

## 2015-08-18 ENCOUNTER — Ambulatory Visit: Payer: Self-pay | Admitting: Psychiatry

## 2015-08-18 ENCOUNTER — Ambulatory Visit: Payer: Medicare Other

## 2015-08-18 VITALS — BP 124/82 | HR 112 | Temp 97.1°F | Ht 68.0 in | Wt 177.8 lb

## 2015-08-18 DIAGNOSIS — F4312 Post-traumatic stress disorder, chronic: Secondary | ICD-10-CM

## 2015-08-18 DIAGNOSIS — N809 Endometriosis, unspecified: Secondary | ICD-10-CM | POA: Insufficient documentation

## 2015-08-18 DIAGNOSIS — F1394 Sedative, hypnotic or anxiolytic use, unspecified with sedative, hypnotic or anxiolytic-induced mood disorder: Secondary | ICD-10-CM

## 2015-08-18 MED ORDER — BUSPIRONE HCL 5 MG PO TABS: 5.0000 mg | ORAL_TABLET | Freq: Two times a day (BID) | ORAL | Status: DC

## 2015-08-18 MED ORDER — TEMAZEPAM 15 MG PO CAPS: 15.0000 mg | ORAL_CAPSULE | Freq: Once | ORAL | Status: DC

## 2015-08-18 MED ORDER — ALPRAZOLAM 2 MG PO TABS: 2.0000 mg | ORAL_TABLET | ORAL | Status: DC

## 2015-08-18 MED ORDER — RISPERIDONE 0.5 MG PO TABS: 0.5000 mg | ORAL_TABLET | Freq: Three times a day (TID) | ORAL | Status: DC
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
## 2015-08-19 ENCOUNTER — Ambulatory Visit: Payer: Medicare Other

## 2015-08-22 ENCOUNTER — Telehealth: Payer: Self-pay | Admitting: Psychiatry

## 2015-08-25 NOTE — Telephone Encounter (Signed)
Called pt back. She is already on Alprazolam 1mg  BID and Temazepam at night. She stated that she is not tolerating Buspar well and was asking if she go HIGHER on Alprazolam. Advised pt that she will, NOT be given higher dose of Alprazolam. Advised her to start taking Buspar at night only if she is having problems with driving and she agreed with plan.  No changes in Medications.   Rainey Pines, MD

## 2015-08-31 ENCOUNTER — Ambulatory Visit: Payer: Medicare HMO

## 2015-09-07 ENCOUNTER — Ambulatory Visit: Payer: Medicare HMO | Admitting: Pain Medicine

## 2015-09-08 ENCOUNTER — Encounter: Payer: Self-pay | Admitting: Psychiatry

## 2015-09-08 ENCOUNTER — Ambulatory Visit: Payer: Self-pay | Admitting: Psychiatry

## 2015-09-08 ENCOUNTER — Ambulatory Visit (INDEPENDENT_AMBULATORY_CARE_PROVIDER_SITE_OTHER): Payer: Medicare HMO | Admitting: Psychiatry

## 2015-09-08 VITALS — BP 140/98 | HR 108 | Temp 97.4°F | Ht 68.0 in | Wt 179.8 lb

## 2015-09-08 DIAGNOSIS — F4312 Post-traumatic stress disorder, chronic: Secondary | ICD-10-CM

## 2015-09-08 MED ORDER — RISPERIDONE 0.5 MG PO TABS: 0.5000 mg | ORAL_TABLET | Freq: Every day | ORAL | Status: DC

## 2015-09-08 MED ORDER — RISPERIDONE 1 MG PO TABS: 1.0000 mg | ORAL_TABLET | Freq: Every day | ORAL | Status: DC

## 2015-09-08 MED ORDER — ZALEPLON 10 MG PO CAPS: 10.0000 mg | ORAL_CAPSULE | Freq: Every evening | ORAL | Status: DC | PRN

## 2015-09-08 MED ORDER — PRAZOSIN HCL 1 MG PO CAPS: 1.0000 mg | ORAL_CAPSULE | Freq: Every day | ORAL | Status: DC

## 2015-09-08 MED ORDER — ALPRAZOLAM 2 MG PO TABS: 2.0000 mg | ORAL_TABLET | ORAL | Status: DC
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
## 2015-09-09 ENCOUNTER — Telehealth: Payer: Self-pay | Admitting: Psychiatry

## 2015-09-09 ENCOUNTER — Ambulatory Visit: Payer: Medicare HMO

## 2015-09-12 NOTE — Telephone Encounter (Signed)
Pt is already on Ativan. We have discussed about prazosin in detail and the adverse effects related to it. Please advise pt to take it as directed. She has enough supply of ativan.

## 2015-09-14 ENCOUNTER — Telehealth: Payer: Self-pay

## 2015-09-14 NOTE — Telephone Encounter (Signed)
pt states that she has never [place] ativan and if that is what you want her on she needs a rx for it.

## 2015-09-14 NOTE — Telephone Encounter (Signed)
pt states that she can not take the prazosin that the pharmacy told her that was  a blood pressure medication , pt states that she is dizzy and feel like she is going to pass out.  pt also wanted to let you know that she has never taking ativan. Pt states that if you want her to take that medication then you need to send in a [facility]

## 2015-09-19 NOTE — Telephone Encounter (Signed)
left message to call office back.  

## 2015-09-19 NOTE — Telephone Encounter (Signed)
Pt is on Xanax. She does not need ATIVAN.

## 2015-09-19 NOTE — Telephone Encounter (Signed)
pt called back , pt was told that according to dr. Gretel Acre pt can not take both ativan and xanax. pt states ok.

## 2015-09-28 ENCOUNTER — Ambulatory Visit
Admission: RE | Admit: 2015-09-28 | Discharge: 2015-09-28 | Disposition: A | Discharge status: Home / Self Care | Payer: Medicare HMO | Source: Ambulatory Visit | Attending: Family Medicine | Admitting: Family Medicine

## 2015-09-28 ENCOUNTER — Other Ambulatory Visit: Payer: Self-pay | Admitting: Family Medicine

## 2015-09-28 DIAGNOSIS — Z1231 Encounter for screening mammogram for malignant neoplasm of breast: Secondary | ICD-10-CM | POA: Insufficient documentation

## 2015-10-03 ENCOUNTER — Ambulatory Visit: Payer: Medicare HMO | Admitting: Pain Medicine

## 2015-10-21 NOTE — Progress Notes (Signed)
Pharmacy notified.

## 2015-11-08 ENCOUNTER — Ambulatory Visit: Payer: Medicare HMO | Admitting: Psychiatry

## 2015-11-10 ENCOUNTER — Other Ambulatory Visit: Payer: Self-pay | Admitting: Psychiatry

## 2015-11-15 ENCOUNTER — Other Ambulatory Visit: Payer: Self-pay

## 2015-11-15 MED ORDER — ZALEPLON 10 MG PO CAPS: 10.0000 mg | ORAL_CAPSULE | Freq: Every evening | ORAL | Status: DC | PRN

## 2015-11-15 NOTE — Telephone Encounter (Signed)
received a request for refill on zaleplon 10mg .  pt last seen on  09-08-15 next appt on  11-17-15

## 2015-11-15 NOTE — Telephone Encounter (Signed)
spoke with patient told rx faxed in . gave enough to do until she can come in for her appt.

## 2015-11-15 NOTE — Telephone Encounter (Signed)
faxed-confirmed rx for zaleplon (sonata) 10mg   id # [id number] order  # [id number]

## 2015-11-17 ENCOUNTER — Ambulatory Visit (INDEPENDENT_AMBULATORY_CARE_PROVIDER_SITE_OTHER): Payer: Medicare Other | Admitting: Psychiatry

## 2015-11-17 ENCOUNTER — Encounter: Payer: Self-pay | Admitting: Psychiatry

## 2015-11-17 VITALS — BP 110/70 | HR 103 | Temp 98.5°F | Ht 68.0 in | Wt 181.2 lb

## 2015-11-17 DIAGNOSIS — F4001 Agoraphobia with panic disorder: Secondary | ICD-10-CM | POA: Diagnosis not present

## 2015-11-17 MED ORDER — ALPRAZOLAM 2 MG PO TABS: 2.0000 mg | ORAL_TABLET | ORAL | Status: DC

## 2015-11-17 MED ORDER — ZALEPLON 10 MG PO CAPS: 10.0000 mg | ORAL_CAPSULE | Freq: Every evening | ORAL | Status: DC | PRN

## 2015-11-17 MED ORDER — RISPERIDONE 0.5 MG PO TABS: 0.5000 mg | ORAL_TABLET | Freq: Three times a day (TID) | ORAL | Status: DC
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
## 2015-12-15 ENCOUNTER — Telehealth: Payer: Self-pay | Admitting: Psychiatry

## 2015-12-16 NOTE — Telephone Encounter (Signed)
pt needs something for sleep

## 2015-12-20 MED ORDER — ZOLPIDEM TARTRATE 10 MG PO TABS: 10.0000 mg | ORAL_TABLET | Freq: Every evening | ORAL | Status: DC | PRN

## 2015-12-20 NOTE — Telephone Encounter (Signed)
pt was called and told rx was faxed

## 2015-12-20 NOTE — Telephone Encounter (Signed)
pt called states she is not sleeping and needs something to help her sleep

## 2015-12-20 NOTE — Telephone Encounter (Signed)
called pharmacy, pt last picked up rx on  11-23-15 pt does have one refill left.  I canceled the last refill.

## 2015-12-20 NOTE — Telephone Encounter (Signed)
rx for ambien 10mg   id # [id number] order # [id number]

## 2015-12-20 NOTE — Telephone Encounter (Signed)
called pt per dr. Gretel Acre and ask for her to bring the sonta in.  pt states that she took her last dose on sunday.  Pt stated that the medication did not keep her asleep.

## 2016-01-12 ENCOUNTER — Encounter: Payer: Self-pay | Admitting: Psychiatry

## 2016-01-12 ENCOUNTER — Ambulatory Visit (INDEPENDENT_AMBULATORY_CARE_PROVIDER_SITE_OTHER): Payer: Medicare Other | Admitting: Psychiatry

## 2016-01-12 VITALS — BP 100/78 | HR 107 | Temp 97.5°F | Ht 68.0 in | Wt 183.8 lb

## 2016-01-12 DIAGNOSIS — F4312 Post-traumatic stress disorder, chronic: Secondary | ICD-10-CM

## 2016-01-12 DIAGNOSIS — F1394 Sedative, hypnotic or anxiolytic use, unspecified with sedative, hypnotic or anxiolytic-induced mood disorder: Secondary | ICD-10-CM | POA: Diagnosis not present

## 2016-01-12 MED ORDER — RISPERIDONE 1 MG PO TABS: 1.0000 mg | ORAL_TABLET | Freq: Every day | ORAL | Status: DC

## 2016-01-12 MED ORDER — ALPRAZOLAM 2 MG PO TABS: 2.0000 mg | ORAL_TABLET | ORAL | Status: DC

## 2016-01-12 MED ORDER — ZOLPIDEM TARTRATE 10 MG PO TABS: 10.0000 mg | ORAL_TABLET | Freq: Every evening | ORAL | Status: DC | PRN

## 2016-01-12 MED ORDER — HYDROXYZINE PAMOATE 25 MG PO CAPS: 25.0000 mg | ORAL_CAPSULE | Freq: Two times a day (BID) | ORAL | Status: DC | PRN
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
## 2016-02-09 ENCOUNTER — Encounter: Payer: Self-pay | Admitting: Psychiatry

## 2016-02-09 ENCOUNTER — Ambulatory Visit (INDEPENDENT_AMBULATORY_CARE_PROVIDER_SITE_OTHER): Payer: Medicare Other | Admitting: Psychiatry

## 2016-02-09 VITALS — BP 98/72 | HR 103 | Temp 97.9°F | Ht 68.0 in | Wt 180.8 lb

## 2016-02-09 DIAGNOSIS — F4312 Post-traumatic stress disorder, chronic: Secondary | ICD-10-CM

## 2016-02-09 MED ORDER — BUSPIRONE HCL 5 MG PO TABS: 5.0000 mg | ORAL_TABLET | Freq: Two times a day (BID) | ORAL | Status: DC

## 2016-02-09 MED ORDER — RISPERIDONE 1 MG PO TABS: 1.0000 mg | ORAL_TABLET | Freq: Every day | ORAL | Status: DC

## 2016-02-09 MED ORDER — ZOLPIDEM TARTRATE 10 MG PO TABS: 10.0000 mg | ORAL_TABLET | Freq: Every evening | ORAL | Status: DC | PRN

## 2016-02-09 MED ORDER — ALPRAZOLAM 1 MG PO TABS: 1.0000 mg | ORAL_TABLET | Freq: Two times a day (BID) | ORAL | Status: DC
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
## 2016-02-23 ENCOUNTER — Ambulatory Visit: Payer: Medicare Other | Admitting: Psychiatry

## 2016-02-27 ENCOUNTER — Ambulatory Visit: Payer: Medicare Other | Admitting: Psychiatry

## 2016-02-28 ENCOUNTER — Ambulatory Visit (INDEPENDENT_AMBULATORY_CARE_PROVIDER_SITE_OTHER): Payer: Medicare Other | Admitting: Psychiatry

## 2016-02-28 ENCOUNTER — Encounter: Payer: Self-pay | Admitting: Psychiatry

## 2016-02-28 VITALS — BP 98/62 | HR 108 | Temp 97.6°F | Ht 68.0 in | Wt 179.2 lb

## 2016-02-28 DIAGNOSIS — F4001 Agoraphobia with panic disorder: Secondary | ICD-10-CM | POA: Diagnosis not present

## 2016-02-28 DIAGNOSIS — F4310 Post-traumatic stress disorder, unspecified: Secondary | ICD-10-CM | POA: Insufficient documentation

## 2016-02-28 DIAGNOSIS — F1394 Sedative, hypnotic or anxiolytic use, unspecified with sedative, hypnotic or anxiolytic-induced mood disorder: Secondary | ICD-10-CM | POA: Diagnosis not present

## 2016-02-28 MED ORDER — HYDROXYZINE PAMOATE 25 MG PO CAPS: 25.0000 mg | ORAL_CAPSULE | Freq: Two times a day (BID) | ORAL | Status: DC | PRN

## 2016-02-28 MED ORDER — RISPERIDONE 1 MG PO TABS: 1.0000 mg | ORAL_TABLET | Freq: Every day | ORAL | Status: DC

## 2016-02-28 MED ORDER — ZOLPIDEM TARTRATE 10 MG PO TABS: 10.0000 mg | ORAL_TABLET | Freq: Every evening | ORAL | Status: DC | PRN

## 2016-02-28 MED ORDER — ALPRAZOLAM 1 MG PO TABS: 1.0000 mg | ORAL_TABLET | Freq: Two times a day (BID) | ORAL | Status: DC

## 2016-02-28 MED ORDER — BUSPIRONE HCL 5 MG PO TABS: 5.0000 mg | ORAL_TABLET | Freq: Three times a day (TID) | ORAL | Status: DC
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
## 2016-03-08 ENCOUNTER — Telehealth: Payer: Self-pay

## 2016-03-08 NOTE — Telephone Encounter (Signed)
I do not prescribe this medication as it for nausea. Please ask PCP

## 2016-03-08 NOTE — Telephone Encounter (Signed)
pt called states that she needs a refill on her phenergan 25mg  take 2 every 6 hours as needed.  pt states that her pcp would not give to her states that Dr. Gretel Acre had to give it too her because it was because of her nervous and anxiety.

## 2016-03-08 NOTE — Telephone Encounter (Signed)
spoke with patient.  pt was told that Dr. Gretel Acre would not refill that she needed to see a PCP. pt states that her current PCP would not refill stating that dr. Gretel Acre was suppose to fill because it was her anxiety that was making her sick.  Pt states that Dr. Humphrey Rolls wanted to stop all her medications and put her on a antidepressant. Pt said she was going to try and find another doctor.

## 2016-04-30 ENCOUNTER — Ambulatory Visit (INDEPENDENT_AMBULATORY_CARE_PROVIDER_SITE_OTHER): Payer: Medicare Other | Admitting: Psychiatry

## 2016-04-30 ENCOUNTER — Encounter: Payer: Self-pay | Admitting: Psychiatry

## 2016-04-30 VITALS — BP 110/72 | HR 103 | Temp 98.0°F | Ht 68.0 in | Wt 181.2 lb

## 2016-04-30 DIAGNOSIS — F4001 Agoraphobia with panic disorder: Secondary | ICD-10-CM

## 2016-04-30 DIAGNOSIS — F1394 Sedative, hypnotic or anxiolytic use, unspecified with sedative, hypnotic or anxiolytic-induced mood disorder: Secondary | ICD-10-CM | POA: Diagnosis not present

## 2016-04-30 MED ORDER — ZOLPIDEM TARTRATE 10 MG PO TABS: 10.0000 mg | ORAL_TABLET | Freq: Every evening | ORAL | Status: DC | PRN

## 2016-04-30 MED ORDER — VENLAFAXINE HCL ER 37.5 MG PO CP24: 37.5000 mg | ORAL_CAPSULE | Freq: Every day | ORAL | Status: DC

## 2016-04-30 MED ORDER — HYDROXYZINE PAMOATE 25 MG PO CAPS: 25.0000 mg | ORAL_CAPSULE | Freq: Two times a day (BID) | ORAL | Status: DC | PRN

## 2016-04-30 MED ORDER — ALPRAZOLAM 1 MG PO TABS: 1.0000 mg | ORAL_TABLET | Freq: Two times a day (BID) | ORAL | Status: DC

## 2016-04-30 MED ORDER — RISPERIDONE 1 MG PO TABS: 1.0000 mg | ORAL_TABLET | Freq: Every day | ORAL | Status: DC
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
## 2016-05-30 ENCOUNTER — Telehealth: Payer: Self-pay | Admitting: Pain Medicine

## 2016-05-30 NOTE — Telephone Encounter (Signed)
Aug 31 at 7:40 Tami Hopkins, needs packet

## 2016-05-31 ENCOUNTER — Encounter: Payer: Self-pay | Admitting: Psychiatry

## 2016-05-31 ENCOUNTER — Ambulatory Visit (INDEPENDENT_AMBULATORY_CARE_PROVIDER_SITE_OTHER): Payer: Medicare Other | Admitting: Psychiatry

## 2016-05-31 VITALS — BP 113/77 | HR 101 | Temp 97.9°F | Ht 68.0 in | Wt 179.6 lb

## 2016-05-31 DIAGNOSIS — F4312 Post-traumatic stress disorder, chronic: Secondary | ICD-10-CM

## 2016-05-31 DIAGNOSIS — F4001 Agoraphobia with panic disorder: Secondary | ICD-10-CM | POA: Diagnosis not present

## 2016-05-31 MED ORDER — RISPERIDONE 1 MG PO TABS: 1.0000 mg | ORAL_TABLET | Freq: Every day | ORAL | 2 refills | Status: DC

## 2016-05-31 MED ORDER — ALPRAZOLAM 1 MG PO TABS: 1.0000 mg | ORAL_TABLET | Freq: Two times a day (BID) | ORAL | 1 refills | Status: DC

## 2016-05-31 MED ORDER — VENLAFAXINE HCL ER 75 MG PO CP24: ORAL_CAPSULE | ORAL | 1 refills | Status: DC

## 2016-05-31 MED ORDER — HYDROXYZINE PAMOATE 25 MG PO CAPS: 25.0000 mg | ORAL_CAPSULE | Freq: Two times a day (BID) | ORAL | 1 refills | Status: DC | PRN

## 2016-05-31 MED ORDER — ZOLPIDEM TARTRATE 10 MG PO TABS: 10.0000 mg | ORAL_TABLET | Freq: Every evening | ORAL | 1 refills | Status: DC | PRN
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
## 2016-06-12 ENCOUNTER — Telehealth: Payer: Self-pay | Admitting: Pain Medicine

## 2016-06-12 NOTE — Telephone Encounter (Signed)
Patient called stating insurance is no longer in effect, cancel appt. She will call back to reschedule when she has insurance.

## 2016-06-21 ENCOUNTER — Ambulatory Visit: Payer: Medicare Other | Admitting: Pain Medicine

## 2016-07-04 ENCOUNTER — Ambulatory Visit: Payer: Medicare Other | Admitting: Pain Medicine

## 2016-07-12 ENCOUNTER — Other Ambulatory Visit: Payer: Self-pay | Admitting: Psychiatry

## 2016-07-12 MED ORDER — RISPERIDONE 1 MG PO TABS: 1.0000 mg | ORAL_TABLET | Freq: Every day | ORAL | 2 refills | Status: DC

## 2016-07-23 ENCOUNTER — Other Ambulatory Visit: Payer: Self-pay

## 2016-07-23 NOTE — Telephone Encounter (Signed)
pt called states that she had to reapply for medicaid.  will not get approved until next month.  pt was wanting to see if you would call in another month of refill and she follow up next month.  when her medicaid come if.    Pt needs xanax and ambien sent in.

## 2016-07-31 ENCOUNTER — Ambulatory Visit (INDEPENDENT_AMBULATORY_CARE_PROVIDER_SITE_OTHER): Payer: Medicare Other | Admitting: Psychiatry

## 2016-07-31 ENCOUNTER — Encounter: Payer: Self-pay | Admitting: Psychiatry

## 2016-07-31 ENCOUNTER — Other Ambulatory Visit: Payer: Self-pay | Admitting: Physician Assistant

## 2016-07-31 VITALS — BP 118/81 | HR 105 | Temp 98.8°F | Wt 179.2 lb

## 2016-07-31 DIAGNOSIS — F4310 Post-traumatic stress disorder, unspecified: Secondary | ICD-10-CM | POA: Diagnosis not present

## 2016-07-31 DIAGNOSIS — Z1231 Encounter for screening mammogram for malignant neoplasm of breast: Secondary | ICD-10-CM

## 2016-07-31 DIAGNOSIS — F4001 Agoraphobia with panic disorder: Secondary | ICD-10-CM | POA: Diagnosis not present

## 2016-07-31 MED ORDER — VORTIOXETINE HBR 10 MG PO TABS: 10.0000 mg | ORAL_TABLET | ORAL | 0 refills | Status: DC

## 2016-07-31 MED ORDER — RISPERIDONE 1 MG PO TABS: 1.0000 mg | ORAL_TABLET | Freq: Every day | ORAL | 2 refills | Status: DC

## 2016-07-31 MED ORDER — TRAZODONE HCL 100 MG PO TABS: 200.0000 mg | ORAL_TABLET | Freq: Every day | ORAL | 0 refills | Status: DC

## 2016-07-31 MED ORDER — HYDROXYZINE PAMOATE 25 MG PO CAPS: 25.0000 mg | ORAL_CAPSULE | Freq: Every day | ORAL | 0 refills | Status: DC

## 2016-07-31 MED ORDER — ALPRAZOLAM 1 MG PO TABS: 1.0000 mg | ORAL_TABLET | Freq: Two times a day (BID) | ORAL | 0 refills | Status: DC
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
## 2016-08-30 ENCOUNTER — Telehealth: Payer: Self-pay

## 2016-08-30 DIAGNOSIS — F4001 Agoraphobia with panic disorder: Secondary | ICD-10-CM

## 2016-08-30 NOTE — Telephone Encounter (Signed)
Medication refill - Patient left a message stating she had to reschedule her appointment to 09/07/16 from 08/31/16 due to an outpatient surgical procedure this date.  Requests 1 week prescription of Xanax and Risperdal be called into [facility] to last until she can now be seen on 09/07/16.

## 2016-08-31 ENCOUNTER — Ambulatory Visit: Payer: Medicare Other | Admitting: Psychiatry

## 2016-08-31 MED ORDER — ALPRAZOLAM 1 MG PO TABS: 1.0000 mg | ORAL_TABLET | Freq: Two times a day (BID) | ORAL | 0 refills | Status: DC

## 2016-08-31 NOTE — Telephone Encounter (Signed)
Refill 1 week supply of meds. Thanks

## 2016-08-31 NOTE — Telephone Encounter (Signed)
Medication management - Telephone call with patient to inform Dr. Gretel Acre approved her 1 week supply of Alprazolam and called into [facility]'s [facility] on [address] with [employer], Software engineer as authorized.  Reminded patient of appointment now set for one week on 09/07/16 and also informed patient she should still have a Risperdal order at her pharmacy she could fill as needed.

## 2016-09-07 ENCOUNTER — Ambulatory Visit (INDEPENDENT_AMBULATORY_CARE_PROVIDER_SITE_OTHER): Payer: Medicare Other | Admitting: Psychiatry

## 2016-09-07 ENCOUNTER — Encounter: Payer: Self-pay | Admitting: Psychiatry

## 2016-09-07 VITALS — BP 146/75 | HR 97 | Temp 97.2°F | Resp 17 | Wt 183.2 lb

## 2016-09-07 DIAGNOSIS — F4001 Agoraphobia with panic disorder: Secondary | ICD-10-CM

## 2016-09-07 DIAGNOSIS — F4310 Post-traumatic stress disorder, unspecified: Secondary | ICD-10-CM | POA: Diagnosis not present

## 2016-09-07 MED ORDER — ALPRAZOLAM 1 MG PO TABS
1.0000 mg | ORAL_TABLET | Freq: Two times a day (BID) | ORAL | 1 refills | Status: DC
Start: 2016-09-07 — End: 2016-12-03

## 2016-09-07 MED ORDER — RISPERIDONE 1 MG PO TABS: 1.0000 mg | ORAL_TABLET | Freq: Every day | ORAL | 2 refills | Status: DC
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
## 2016-09-23 ENCOUNTER — Other Ambulatory Visit: Payer: Self-pay | Admitting: Psychiatry

## 2016-10-01 ENCOUNTER — Ambulatory Visit: Payer: Medicare Other

## 2016-10-08 DIAGNOSIS — J3081 Allergic rhinitis due to animal (cat) (dog) hair and dander: Secondary | ICD-10-CM | POA: Insufficient documentation

## 2016-10-08 DIAGNOSIS — E7800 Pure hypercholesterolemia, unspecified: Secondary | ICD-10-CM | POA: Insufficient documentation

## 2016-10-08 DIAGNOSIS — M25551 Pain in right hip: Secondary | ICD-10-CM

## 2016-10-08 DIAGNOSIS — M5441 Lumbago with sciatica, right side: Secondary | ICD-10-CM

## 2016-10-08 DIAGNOSIS — Q675 Congenital deformity of spine: Secondary | ICD-10-CM | POA: Insufficient documentation

## 2016-10-08 DIAGNOSIS — E663 Overweight: Secondary | ICD-10-CM | POA: Insufficient documentation

## 2016-10-08 DIAGNOSIS — J4540 Moderate persistent asthma, uncomplicated: Secondary | ICD-10-CM | POA: Insufficient documentation

## 2016-10-08 DIAGNOSIS — G8929 Other chronic pain: Secondary | ICD-10-CM | POA: Insufficient documentation

## 2016-10-08 DIAGNOSIS — F17200 Nicotine dependence, unspecified, uncomplicated: Secondary | ICD-10-CM | POA: Insufficient documentation

## 2016-11-02 ENCOUNTER — Ambulatory Visit: Payer: Medicare Other

## 2016-11-06 ENCOUNTER — Telehealth: Payer: Self-pay

## 2016-11-06 NOTE — Telephone Encounter (Signed)
pt has appt for 11-26-16 pt needs refill on xanax and on the hydroxyzine .

## 2016-11-07 ENCOUNTER — Ambulatory Visit: Payer: Medicare Other | Admitting: Psychiatry

## 2016-11-19 DIAGNOSIS — K582 Mixed irritable bowel syndrome: Secondary | ICD-10-CM | POA: Insufficient documentation

## 2016-11-23 ENCOUNTER — Ambulatory Visit: Payer: Medicare Other | Admitting: Psychiatry

## 2016-11-30 ENCOUNTER — Ambulatory Visit: Payer: Medicare Other

## 2016-12-03 ENCOUNTER — Encounter: Payer: Self-pay | Admitting: Psychiatry

## 2016-12-03 ENCOUNTER — Ambulatory Visit (INDEPENDENT_AMBULATORY_CARE_PROVIDER_SITE_OTHER): Payer: Medicare Other | Admitting: Psychiatry

## 2016-12-03 ENCOUNTER — Ambulatory Visit: Payer: Medicare Other | Admitting: Psychiatry

## 2016-12-03 VITALS — BP 100/70 | HR 111 | Temp 98.7°F | Wt 182.0 lb

## 2016-12-03 DIAGNOSIS — F4310 Post-traumatic stress disorder, unspecified: Secondary | ICD-10-CM

## 2016-12-03 DIAGNOSIS — F4001 Agoraphobia with panic disorder: Secondary | ICD-10-CM

## 2016-12-03 DIAGNOSIS — F1394 Sedative, hypnotic or anxiolytic use, unspecified with sedative, hypnotic or anxiolytic-induced mood disorder: Secondary | ICD-10-CM | POA: Diagnosis not present

## 2016-12-03 MED ORDER — RISPERIDONE 1 MG PO TABS: 1.0000 mg | ORAL_TABLET | Freq: Every day | ORAL | 2 refills | Status: DC

## 2016-12-03 MED ORDER — BUSPIRONE HCL 10 MG PO TABS: 10.0000 mg | ORAL_TABLET | Freq: Two times a day (BID) | ORAL | 1 refills | Status: DC

## 2016-12-03 MED ORDER — HYDROXYZINE PAMOATE 25 MG PO CAPS: 25.0000 mg | ORAL_CAPSULE | Freq: Every day | ORAL | 1 refills | Status: DC

## 2016-12-03 MED ORDER — ALPRAZOLAM 1 MG PO TABS: 1.0000 mg | ORAL_TABLET | Freq: Every day | ORAL | 1 refills | Status: DC
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
## 2016-12-24 ENCOUNTER — Ambulatory Visit
Admission: RE | Admit: 2016-12-24 | Discharge: 2016-12-24 | Disposition: A | Discharge status: Home / Self Care | Payer: Medicare Other | Source: Ambulatory Visit | Attending: Physician Assistant | Admitting: Physician Assistant

## 2016-12-24 DIAGNOSIS — Z1231 Encounter for screening mammogram for malignant neoplasm of breast: Secondary | ICD-10-CM

## 2016-12-31 ENCOUNTER — Other Ambulatory Visit: Payer: Self-pay | Admitting: Pain Medicine

## 2017-01-07 ENCOUNTER — Telehealth: Payer: Self-pay

## 2017-01-07 NOTE — Telephone Encounter (Signed)
pt called wanted to find out if you would let her try remeron.  pt states that the other sleep medcation makes her heart race and she can not take it.

## 2017-01-14 ENCOUNTER — Ambulatory Visit (INDEPENDENT_AMBULATORY_CARE_PROVIDER_SITE_OTHER): Payer: Medicare Other | Admitting: Psychiatry

## 2017-01-14 ENCOUNTER — Encounter: Payer: Self-pay | Admitting: Psychiatry

## 2017-01-14 VITALS — BP 131/82 | HR 102 | Temp 98.3°F | Wt 180.0 lb

## 2017-01-14 DIAGNOSIS — F4001 Agoraphobia with panic disorder: Secondary | ICD-10-CM

## 2017-01-14 DIAGNOSIS — F1394 Sedative, hypnotic or anxiolytic use, unspecified with sedative, hypnotic or anxiolytic-induced mood disorder: Secondary | ICD-10-CM

## 2017-01-14 MED ORDER — RISPERIDONE 1 MG PO TABS: 1.0000 mg | ORAL_TABLET | Freq: Every day | ORAL | 2 refills | Status: DC

## 2017-01-14 MED ORDER — ALPRAZOLAM 0.5 MG PO TABS: 0.5000 mg | ORAL_TABLET | Freq: Every day | ORAL | 0 refills | Status: DC

## 2017-01-14 MED ORDER — BUSPIRONE HCL 10 MG PO TABS: 10.0000 mg | ORAL_TABLET | Freq: Three times a day (TID) | ORAL | 1 refills | Status: DC

## 2017-01-14 MED ORDER — MIRTAZAPINE 15 MG PO TABS: 15.0000 mg | ORAL_TABLET | Freq: Every day | ORAL | 1 refills | Status: DC
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
## 2017-02-05 ENCOUNTER — Encounter: Payer: Self-pay | Admitting: Anesthesiology

## 2017-02-05 ENCOUNTER — Ambulatory Visit: Payer: Medicare Other | Attending: Anesthesiology | Admitting: Anesthesiology

## 2017-02-05 VITALS — BP 117/76 | HR 106 | Temp 98.3°F | Resp 16 | Ht 68.0 in | Wt 178.0 lb

## 2017-02-05 DIAGNOSIS — M47817 Spondylosis without myelopathy or radiculopathy, lumbosacral region: Secondary | ICD-10-CM

## 2017-02-05 DIAGNOSIS — J45909 Unspecified asthma, uncomplicated: Secondary | ICD-10-CM | POA: Diagnosis not present

## 2017-02-05 DIAGNOSIS — E785 Hyperlipidemia, unspecified: Secondary | ICD-10-CM | POA: Insufficient documentation

## 2017-02-05 DIAGNOSIS — M41125 Adolescent idiopathic scoliosis, thoracolumbar region: Secondary | ICD-10-CM

## 2017-02-05 DIAGNOSIS — M549 Dorsalgia, unspecified: Secondary | ICD-10-CM

## 2017-02-05 DIAGNOSIS — G8929 Other chronic pain: Secondary | ICD-10-CM | POA: Diagnosis not present

## 2017-02-05 DIAGNOSIS — Z79899 Other long term (current) drug therapy: Secondary | ICD-10-CM | POA: Diagnosis not present

## 2017-02-05 DIAGNOSIS — Z811 Family history of alcohol abuse and dependence: Secondary | ICD-10-CM | POA: Insufficient documentation

## 2017-02-05 DIAGNOSIS — F4310 Post-traumatic stress disorder, unspecified: Secondary | ICD-10-CM | POA: Diagnosis not present

## 2017-02-05 DIAGNOSIS — M546 Pain in thoracic spine: Secondary | ICD-10-CM | POA: Diagnosis not present

## 2017-02-05 DIAGNOSIS — M4697 Unspecified inflammatory spondylopathy, lumbosacral region: Secondary | ICD-10-CM

## 2017-02-05 DIAGNOSIS — F17210 Nicotine dependence, cigarettes, uncomplicated: Secondary | ICD-10-CM | POA: Diagnosis not present

## 2017-02-05 DIAGNOSIS — Z818 Family history of other mental and behavioral disorders: Secondary | ICD-10-CM | POA: Insufficient documentation

## 2017-02-05 DIAGNOSIS — M5136 Other intervertebral disc degeneration, lumbar region: Secondary | ICD-10-CM | POA: Diagnosis not present

## 2017-02-05 DIAGNOSIS — Z8261 Family history of arthritis: Secondary | ICD-10-CM | POA: Diagnosis not present

## 2017-02-05 DIAGNOSIS — Z833 Family history of diabetes mellitus: Secondary | ICD-10-CM | POA: Insufficient documentation

## 2017-02-05 DIAGNOSIS — Z8249 Family history of ischemic heart disease and other diseases of the circulatory system: Secondary | ICD-10-CM | POA: Insufficient documentation

## 2017-02-05 DIAGNOSIS — Z7951 Long term (current) use of inhaled steroids: Secondary | ICD-10-CM | POA: Diagnosis not present

## 2017-02-05 DIAGNOSIS — Z825 Family history of asthma and other chronic lower respiratory diseases: Secondary | ICD-10-CM | POA: Diagnosis not present

## 2017-02-05 MED ORDER — BACLOFEN 10 MG PO TABS: 10.0000 mg | ORAL_TABLET | Freq: Every day | ORAL | 1 refills | Status: DC

## 2017-02-05 MED ORDER — TRAMADOL HCL 50 MG PO TABS: 50.0000 mg | ORAL_TABLET | Freq: Four times a day (QID) | ORAL | 1 refills | Status: DC

## 2017-02-05 NOTE — Progress Notes (Signed)
Safety precautions to be maintained throughout the outpatient stay will include: orient to surroundings, keep bed in low position, maintain call bell within reach at all times, provide assistance with transfer out of bed and ambulation.  

## 2017-02-06 NOTE — Progress Notes (Signed)
Subjective:  Patient ID: Tami Hopkins, female    DOB: April 01, 1961  Age: 56 y.o. MRN: [id number]  CC: Back Pain (bilateral, mainly on the Right ) and Hip Pain (bursitis bilateral)     PROCEDURE:None  HPI Tami Hopkins presents for a new patient evaluation. She is a pleasant 56 year old white female with a long-standing history of low back pain present since she was 56 years of age. She has scoliosis in the mid thoracic and high lumbar region. She has persistent pain that is gradually been getting worse over the past several months. It does not appear to be influenced by time of day but is worse with sitting stooping and twisting motions and alleviated by lying down and medication management. In the past she has been on opioids but these have been discontinued. She does take muscle relaxants and Ultram and they generally give her good relief. Otherwise she describes the pain as spasming with associated midthoracic numbness and pain that wakes her up at night of a burning nature in the mid thoracic back. No associated weakness to the lower extremities up is noted. She occasionally has numbness and tingling at the base of both feet. She has not had previous nerve blocks and expresses interest in not having any type of injection therapy. She also reports that Lioresal works well but NSAIDs cause palpitations.  History [name] has a past medical history of Anxiety; Asthma; Bursitis of hip bilateral; Hyperlipidemia; PTSD (post-traumatic stress disorder); and Scoliosis.   She has a past surgical history that includes Cesarean section; Endometrial ablation; Abdominal hysterectomy; Nose surgery; and Cholecystectomy.   Her family history includes Alcohol abuse in her father; Arthritis in her mother; Asthma in her mother; Depression in her mother; Diabetes in her sister; Heart attack in her father; Hypertension in her sister; Obesity in her sister.She reports that she has been smoking Cigarettes.  She  started smoking about 21 years ago. She has been smoking about 0.25 packs per day. She has never used smokeless tobacco. She reports that she does not drink alcohol or use drugs.  No results found for this or any previous visit.  No results found for: TOXASSSELUR  Outpatient Medications Prior to Visit  Medication Sig Dispense Refill  . diphenoxylate-atropine (LOMOTIL) 2.5-0.025 MG per tablet Take 1 tablet by mouth 4 (four) times daily as needed for diarrhea or loose stools.    Marland Kitchen FLOVENT HFA 110 MCG/ACT inhaler     . fluticasone (FLONASE) 50 MCG/ACT nasal spray Place 1 spray into both nostrils 2 (two) times daily.    . furosemide (LASIX) 40 MG tablet Take 1 tablet by mouth daily.    Marland Kitchen KLOR-CON 10 10 MEQ tablet Take 10 mEq by mouth daily.  5  . montelukast (SINGULAIR) 10 MG tablet Take 10 mg by mouth at bedtime.    Marland Kitchen PROAIR HFA 108 (90 BASE) MCG/ACT inhaler Inhale 2 puffs into the lungs every 4 (four) hours as needed.    . promethazine (PHENERGAN) 25 MG tablet Take 50 mg by mouth as needed.     . risperiDONE (RISPERDAL) 1 MG tablet Take 1 tablet (1 mg total) by mouth at bedtime. 30 tablet 2  . ZETIA 10 MG tablet Take 10 mg by mouth daily.  5  . baclofen (LIORESAL) 10 MG tablet Take 10 mg by mouth 2 (two) times daily.  0  . ALPRAZolam (XANAX) 0.5 MG tablet Take 1 tablet (0.5 mg total) by mouth daily. (Patient not taking: Reported on 02/05/2017) 30  tablet 0  . busPIRone (BUSPAR) 10 MG tablet Take 1 tablet (10 mg total) by mouth 3 (three) times daily. (Patient not taking: Reported on 02/05/2017) 90 tablet 1  . mirtazapine (REMERON) 15 MG tablet Take 1 tablet (15 mg total) by mouth at bedtime. (Patient not taking: Reported on 02/05/2017) 30 tablet 1  . omeprazole (PRILOSEC) 20 MG capsule TAKE 1 CAPSULE (20 MG TOTAL) BY MOUTH DAILY.  3  . traMADol (ULTRAM) 50 MG tablet Take 50 mg by mouth.     No facility-administered medications prior to visit.    Lab Results  Component Value Date   WBC 12.0 (H)  05/11/2015   HGB 12.1 05/11/2015   HCT 36.1 05/11/2015   PLT 223 05/11/2015   GLUCOSE 118 (H) 05/14/2015   ALT 148 (H) 05/14/2015   AST 170 (H) 05/14/2015   NA 138 05/14/2015   K 3.7 05/14/2015   CL 105 05/14/2015   CREATININE 0.72 05/14/2015   BUN 11 05/14/2015   CO2 25 05/14/2015   TSH 0.912 05/11/2015    --------------------------------------------------------------------------------------------------------------------- Mm Digital Screening Bilateral  Result Date: 12/24/2016 CLINICAL DATA:  Screening. EXAM: DIGITAL SCREENING BILATERAL MAMMOGRAM WITH CAD COMPARISON:  Previous exam(s). ACR Breast Density Category b: There are scattered areas of fibroglandular density. FINDINGS: There are no findings suspicious for malignancy. Images were processed with CAD. IMPRESSION: No mammographic evidence of malignancy. A result letter of this screening mammogram will be mailed directly to the patient. RECOMMENDATION: Screening mammogram in one year. (Code:SM-B-01Y) BI-RADS CATEGORY  1: Negative. Electronically Signed   By: Lillia Mountain M.D.   On: 12/24/2016 12:47       ---------------------------------------------------------------------------------------------------------------------- Past Medical History:  Diagnosis Date  . Anxiety   . Asthma   . Bursitis of hip bilateral   . Hyperlipidemia   . PTSD (post-traumatic stress disorder)   . Scoliosis     Past Surgical History:  Procedure Laterality Date  . ABDOMINAL HYSTERECTOMY    . CESAREAN SECTION    . CHOLECYSTECTOMY    . ENDOMETRIAL ABLATION     x3  . NOSE SURGERY      Family History  Problem Relation Age of Onset  . Asthma Mother   . Arthritis Mother   . Depression Mother   . Heart attack Father   . Alcohol abuse Father   . Hypertension Sister   . Diabetes Sister   . Obesity Sister   . Heart failure Neg Hx   . Breast cancer Neg Hx     Social History  Substance Use Topics  . Smoking status: Current Every Day Smoker     Packs/day: 0.25    Types: Cigarettes    Start date: 08/18/1995  . Smokeless tobacco: Never Used  . Alcohol use No     Comment: rare    ---------------------------------------------------------------------------------------------------------------------  Scheduled Meds: Continuous Infusions: PRN Meds:.   BP 117/76 (BP Location: Left Arm, Patient Position: Sitting, Cuff Size: Normal)   Pulse (!) 106   Temp 98.3 F (36.8 C) (Oral)   Resp 16   Ht 5\' 8"  (1.727 m)   Wt 178 lb (80.7 kg)   SpO2 98%   BMI 27.06 kg/m    BP Readings from Last 3 Encounters:  02/05/17 117/76  01/14/17 131/82  12/03/16 100/70     Wt Readings from Last 3 Encounters:  02/05/17 178 lb (80.7 kg)  01/14/17 180 lb (81.6 kg)  12/03/16 182 lb (82.6 kg)     ----------------------------------------------------------------------------------------------------------------------  ROS Review  of Systems  Cardiac: Negative Pulmonary: Occasional wheezing but no shortness of breath at present No logic: As above Psychologic: Anxiety history of panic attacks and PTSD with no suicidal or homicidal ideation or history of drug abuse. No history of alcoholism. GI: No constipation or gastritis The remainder of review of systems is reportedly negative  Objective:  BP 117/76 (BP Location: Left Arm, Patient Position: Sitting, Cuff Size: Normal)   Pulse (!) 106   Temp 98.3 F (36.8 C) (Oral)   Resp 16   Ht 5\' 8"  (1.727 m)   Wt 178 lb (80.7 kg)   SpO2 98%   BMI 27.06 kg/m   Physical Exam Patient is a pleasant 56 year old white female with no acute distress. She ambulates reasonably well. She has from seated to standing with slight discomfort in the low back. X line her heart is regular rate and rhythm without murmur Lungs are clear to auscultation GI deferred Inspection low back and mid thoracic back reveals a kyphosis at approximately the mid to lower thoracic level with evidence of scoliotic change.  There is diffuse paraspinous muscle tenderness in both the mid thoracic and lumbar region. She does not have pain with extension in the standing position upon (right lateral rotation. With the patient in the supine position she has a negative straight leg raise both left and right. Her muscle tone and bulk in the lower extremities is good with sensation intact and good range of motion.     Assessment & Plan:   Ashlee was seen today for back pain and hip pain.  Diagnoses and all orders for this visit:  DDD (degenerative disc disease), lumbar -     ToxASSURE Select 13 (MW), Urine  Adolescent idiopathic scoliosis of thoracolumbar region -     ToxASSURE Select 59 (MW), Urine  Chronic bilateral thoracic back pain -     ToxASSURE Select 13 (MW), Urine  Facet arthritis of lumbosacral region ([place]) -     ToxASSURE Select 13 (MW), Urine  Musculoskeletal back pain -     ToxASSURE Select 13 (MW), Urine  Other orders -     baclofen (LIORESAL) 10 MG tablet; Take 1 tablet (10 mg total) by mouth at bedtime. -     traMADol (ULTRAM) 50 MG tablet; Take 1 tablet (50 mg total) by mouth 4 (four) times daily.     ----------------------------------------------------------------------------------------------------------------------  Problem List Items Addressed This Visit    None    Visit Diagnoses    DDD (degenerative disc disease), lumbar    -  Primary   Relevant Medications   baclofen (LIORESAL) 10 MG tablet   traMADol (ULTRAM) 50 MG tablet   Other Relevant Orders   ToxASSURE Select 13 (MW), Urine   Adolescent idiopathic scoliosis of thoracolumbar region       Relevant Orders   ToxASSURE Select 3 (MW), Urine   Chronic bilateral thoracic back pain       Relevant Medications   clonazePAM (KLONOPIN) 0.5 MG tablet   baclofen (LIORESAL) 10 MG tablet   traMADol (ULTRAM) 50 MG tablet   Other Relevant Orders   ToxASSURE Select 13 (MW), Urine   Facet arthritis of lumbosacral region ([facility])        Relevant Medications   baclofen (LIORESAL) 10 MG tablet   traMADol (ULTRAM) 50 MG tablet   Other Relevant Orders   ToxASSURE Select 13 (MW), Urine   Musculoskeletal back pain       Relevant Medications   baclofen (LIORESAL) 10  MG tablet   traMADol (ULTRAM) 50 MG tablet   Other Relevant Orders   ToxASSURE Select 13 (MW), Urine      ----------------------------------------------------------------------------------------------------------------------  1. DDD (degenerative disc disease), lumbar We've had a long discussion regarding her care and I believe that conservative measurements are in order. I do not feel that she is an appropriate candidate for injection therapy. I want her to continue with back stretching strengthening exercises with core strengthening. As will be beneficial both for her lumbar spine and with her scoliosis. - ToxASSURE Select 13 (MW), Urine  2. Adolescent idiopathic scoliosis of thoracolumbar region  - ToxASSURE Select 3 (MW), Urine  3. Chronic bilateral thoracic back pain We will refill her prescription for baclofen to be taken at bedtime 10 mg strength as well as tramadol 50 mg tablets to be taken up to 4 times a day. She is to return to clinic in 1 or 2 month for reevaluation. His been instructed to contact us if she has any troubles with her medications or any change in her clinical symptoms. - ToxASSURE Select 13 (MW), Urine  4. Facet arthritis of lumbosacral region ([place])  - ToxASSURE Select 13 (MW), Urine  5. Musculoskeletal back pain Continue with back stretching strengthening exercises. - ToxASSURE Select 13 (MW), Urine    ----------------------------------------------------------------------------------------------------------------------  I have changed Ms. Ozment's baclofen and traMADol. I am also having her maintain her diphenoxylate-atropine, montelukast, furosemide, fluticasone, PROAIR HFA, ZETIA, KLOR-CON 10, promethazine, omeprazole,  FLOVENT HFA, busPIRone, risperiDONE, ALPRAZolam, mirtazapine, zolpidem, and clonazePAM.   Meds ordered this encounter  Medications  . zolpidem (AMBIEN) 10 MG tablet    Sig: Take 10 mg by mouth at bedtime as needed for sleep.  . clonazePAM (KLONOPIN) 0.5 MG tablet    Sig: Take 0.25 mg by mouth 2 (two) times daily as needed for anxiety.  . baclofen (LIORESAL) 10 MG tablet    Sig: Take 1 tablet (10 mg total) by mouth at bedtime.    Dispense:  30 each    Refill:  1  . traMADol (ULTRAM) 50 MG tablet    Sig: Take 1 tablet (50 mg total) by mouth 4 (four) times daily.    Dispense:  120 tablet    Refill:  1       Follow-up: Return in about 2 months (around 04/07/2017) for evaluation, med refill.    Molli Barrows, MD 2:54 PM  The [place] practitioner database for opioid medications on this patient has been reviewed by me and my staff   Greater than 50% of the total encounter time was spent in counseling and / or coordination of care.     This dictation was performed utilizing Systems analyst.  Please excuse any unintentional or mistaken typographical errors as a result.

## 2017-02-08 DIAGNOSIS — M6530 Trigger finger, unspecified finger: Secondary | ICD-10-CM | POA: Insufficient documentation

## 2017-02-11 LAB — TOXASSURE SELECT 13 (MW), URINE

## 2017-02-12 ENCOUNTER — Ambulatory Visit: Payer: Self-pay | Admitting: Psychiatry

## 2017-03-13 ENCOUNTER — Ambulatory Visit: Payer: Medicare Other | Admitting: Psychiatry

## 2017-03-28 ENCOUNTER — Ambulatory Visit: Payer: Medicare Other | Attending: Anesthesiology | Admitting: Anesthesiology

## 2017-03-28 ENCOUNTER — Encounter: Payer: Self-pay | Admitting: Anesthesiology

## 2017-03-28 VITALS — BP 108/76 | HR 100 | Temp 98.2°F | Resp 18 | Ht 68.0 in | Wt 178.0 lb

## 2017-03-28 DIAGNOSIS — F17210 Nicotine dependence, cigarettes, uncomplicated: Secondary | ICD-10-CM | POA: Insufficient documentation

## 2017-03-28 DIAGNOSIS — E785 Hyperlipidemia, unspecified: Secondary | ICD-10-CM | POA: Diagnosis not present

## 2017-03-28 DIAGNOSIS — M47817 Spondylosis without myelopathy or radiculopathy, lumbosacral region: Secondary | ICD-10-CM

## 2017-03-28 DIAGNOSIS — M25512 Pain in left shoulder: Secondary | ICD-10-CM | POA: Insufficient documentation

## 2017-03-28 DIAGNOSIS — M25552 Pain in left hip: Secondary | ICD-10-CM | POA: Insufficient documentation

## 2017-03-28 DIAGNOSIS — G8929 Other chronic pain: Secondary | ICD-10-CM | POA: Diagnosis not present

## 2017-03-28 DIAGNOSIS — J45909 Unspecified asthma, uncomplicated: Secondary | ICD-10-CM | POA: Insufficient documentation

## 2017-03-28 DIAGNOSIS — M419 Scoliosis, unspecified: Secondary | ICD-10-CM | POA: Insufficient documentation

## 2017-03-28 DIAGNOSIS — M4125 Other idiopathic scoliosis, thoracolumbar region: Secondary | ICD-10-CM | POA: Insufficient documentation

## 2017-03-28 DIAGNOSIS — M549 Dorsalgia, unspecified: Secondary | ICD-10-CM

## 2017-03-28 DIAGNOSIS — M5136 Other intervertebral disc degeneration, lumbar region: Secondary | ICD-10-CM | POA: Diagnosis not present

## 2017-03-28 DIAGNOSIS — F4310 Post-traumatic stress disorder, unspecified: Secondary | ICD-10-CM | POA: Diagnosis not present

## 2017-03-28 DIAGNOSIS — M545 Low back pain: Secondary | ICD-10-CM | POA: Diagnosis present

## 2017-03-28 DIAGNOSIS — M4697 Unspecified inflammatory spondylopathy, lumbosacral region: Secondary | ICD-10-CM | POA: Diagnosis not present

## 2017-03-28 DIAGNOSIS — M25511 Pain in right shoulder: Secondary | ICD-10-CM | POA: Diagnosis present

## 2017-03-28 MED ORDER — HYDROCODONE-ACETAMINOPHEN 5-325 MG PO TABS: 1.0000 | ORAL_TABLET | Freq: Four times a day (QID) | ORAL | 0 refills | Status: DC | PRN

## 2017-03-28 MED ORDER — TRAMADOL HCL 50 MG PO TABS: 50.0000 mg | ORAL_TABLET | Freq: Four times a day (QID) | ORAL | 1 refills | Status: DC

## 2017-03-28 NOTE — Patient Instructions (Signed)
GENERAL RISKS AND COMPLICATIONS  What are the risk, side effects and possible complications? Generally speaking, most procedures are safe.  However, with any procedure there are risks, side effects, and the possibility of complications.  The risks and complications are dependent upon the sites that are lesioned, or the type of nerve block to be performed.  The closer the procedure is to the spine, the more serious the risks are.  Great care is taken when placing the radio frequency needles, block needles or lesioning probes, but sometimes complications can occur. 1. Infection: Any time there is an injection through the skin, there is a risk of infection.  This is why sterile conditions are used for these blocks.  There are four possible types of infection. 1. Localized skin infection. 2. Central Nervous System Infection-This can be in the form of Meningitis, which can be deadly. 3. Epidural Infections-This can be in the form of an epidural abscess, which can cause pressure inside of the spine, causing compression of the spinal cord with subsequent paralysis. This would require an emergency surgery to decompress, and there are no guarantees that the patient would recover from the paralysis. 4. Discitis-This is an infection of the intervertebral discs.  It occurs in about 1% of discography procedures.  It is difficult to treat and it may lead to surgery.        2. Pain: the needles have to go through skin and soft tissues, will cause soreness.       3. Damage to internal structures:  The nerves to be lesioned may be near blood vessels or    other nerves which can be potentially damaged.       4. Bleeding: Bleeding is more common if the patient is taking blood thinners such as  aspirin, Coumadin, Ticiid, Plavix, etc., or if he/she have some genetic predisposition  such as hemophilia. Bleeding into the spinal canal can cause compression of the spinal  cord with subsequent paralysis.  This would require an  emergency surgery to  decompress and there are no guarantees that the patient would recover from the  paralysis.       5. Pneumothorax:  Puncturing of a lung is a possibility, every time a needle is introduced in  the area of the chest or upper back.  Pneumothorax refers to free air around the  collapsed lung(s), inside of the thoracic cavity (chest cavity).  Another two possible  complications related to a similar event would include: Hemothorax and Chylothorax.   These are variations of the Pneumothorax, where instead of air around the collapsed  lung(s), you may have blood or chyle, respectively.       6. Spinal headaches: They may occur with any procedures in the area of the spine.       7. Persistent CSF (Cerebro-Spinal Fluid) leakage: This is a rare problem, but may occur  with prolonged intrathecal or epidural catheters either due to the formation of a fistulous  track or a dural tear.       8. Nerve damage: By working so close to the spinal cord, there is always a possibility of  nerve damage, which could be as serious as a permanent spinal cord injury with  paralysis.       9. Death:  Although rare, severe deadly allergic reactions known as "Anaphylactic  reaction" can occur to any of the medications used.      10. Worsening of the symptoms:  We can always make thing worse.    What are the chances of something like this happening? Chances of any of this occuring are extremely low.  By statistics, you have more of a chance of getting killed in a motor vehicle accident: while driving to the hospital than any of the above occurring .  Nevertheless, you should be aware that they are possibilities.  In general, it is similar to taking a shower.  Everybody knows that you can slip, hit your head and get killed.  Does that mean that you should not shower again?  Nevertheless always keep in mind that statistics do not mean anything if you happen to be on the wrong side of them.  Even if a procedure has a 1  (one) in a 1,000,000 (million) chance of going wrong, it you happen to be that one..Also, keep in mind that by statistics, you have more of a chance of having something go wrong when taking medications.  Who should not have this procedure? If you are on a blood thinning medication (e.g. Coumadin, Plavix, see list of "Blood Thinners"), or if you have an active infection going on, you should not have the procedure.  If you are taking any blood thinners, please inform your physician.  How should I prepare for this procedure?  Do not eat or drink anything at least six hours prior to the procedure.  Bring a driver with you .  It cannot be a taxi.  Come accompanied by an adult that can drive you back, and that is strong enough to help you if your legs get weak or numb from the local anesthetic.  Take all of your medicines the morning of the procedure with just enough water to swallow them.  If you have diabetes, make sure that you are scheduled to have your procedure done first thing in the morning, whenever possible.  If you have diabetes, take only half of your insulin dose and notify our nurse that you have done so as soon as you arrive at the clinic.  If you are diabetic, but only take blood sugar pills (oral hypoglycemic), then do not take them on the morning of your procedure.  You may take them after you have had the procedure.  Do not take aspirin or any aspirin-containing medications, at least eleven (11) days prior to the procedure.  They may prolong bleeding.  Wear loose fitting clothing that may be easy to take off and that you would not mind if it got stained with Betadine or blood.  Do not wear any jewelry or perfume  Remove any nail coloring.  It will interfere with some of our monitoring equipment.  NOTE: Remember that this is not meant to be interpreted as a complete list of all possible complications.  Unforeseen problems may occur.  BLOOD THINNERS The following drugs  contain aspirin or other products, which can cause increased bleeding during surgery and should not be taken for 2 weeks prior to and 1 week after surgery.  If you should need take something for relief of minor pain, you may take acetaminophen which is found in Tylenol,m Datril, Anacin-3 and Panadol. It is not blood thinner. The products listed below are.  Do not take any of the products listed below in addition to any listed on your instruction sheet.  A.P.C or A.P.C with Codeine Codeine Phosphate Capsules #3 Ibuprofen Ridaura  ABC compound Congesprin Imuran rimadil  Advil Cope Indocin Robaxisal  Alka-Seltzer Effervescent Pain Reliever and Antacid Coricidin or Coricidin-D  Indomethacin Rufen    Alka-Seltzer plus Cold Medicine Cosprin Ketoprofen S-A-C Tablets  Anacin Analgesic Tablets or Capsules Coumadin Korlgesic Salflex  Anacin Extra Strength Analgesic tablets or capsules CP-2 Tablets Lanoril Salicylate  Anaprox Cuprimine Capsules Levenox Salocol  Anexsia-D Dalteparin Magan Salsalate  Anodynos Darvon compound Magnesium Salicylate Sine-off  Ansaid Dasin Capsules Magsal Sodium Salicylate  Anturane Depen Capsules Marnal Soma  APF Arthritis pain formula Dewitt's Pills Measurin Stanback  Argesic Dia-Gesic Meclofenamic Sulfinpyrazone  Arthritis Bayer Timed Release Aspirin Diclofenac Meclomen Sulindac  Arthritis pain formula Anacin Dicumarol Medipren Supac  Analgesic (Safety coated) Arthralgen Diffunasal Mefanamic Suprofen  Arthritis Strength Bufferin Dihydrocodeine Mepro Compound Suprol  Arthropan liquid Dopirydamole Methcarbomol with Aspirin Synalgos  ASA tablets/Enseals Disalcid Micrainin Tagament  Ascriptin Doan's Midol Talwin  Ascriptin A/D Dolene Mobidin Tanderil  Ascriptin Extra Strength Dolobid Moblgesic Ticlid  Ascriptin with Codeine Doloprin or Doloprin with Codeine Momentum Tolectin  Asperbuf Duoprin Mono-gesic Trendar  Aspergum Duradyne Motrin or Motrin IB Triminicin  Aspirin  plain, buffered or enteric coated Durasal Myochrisine Trigesic  Aspirin Suppositories Easprin Nalfon Trillsate  Aspirin with Codeine Ecotrin Regular or Extra Strength Naprosyn Uracel  Atromid-S Efficin Naproxen Ursinus  Auranofin Capsules Elmiron Neocylate Vanquish  Axotal Emagrin Norgesic Verin  Azathioprine Empirin or Empirin with Codeine Normiflo Vitamin E  Azolid Emprazil Nuprin Voltaren  Bayer Aspirin plain, buffered or children's or timed BC Tablets or powders Encaprin Orgaran Warfarin Sodium  Buff-a-Comp Enoxaparin Orudis Zorpin  Buff-a-Comp with Codeine Equegesic Os-Cal-Gesic   Buffaprin Excedrin plain, buffered or Extra Strength Oxalid   Bufferin Arthritis Strength Feldene Oxphenbutazone   Bufferin plain or Extra Strength Feldene Capsules Oxycodone with Aspirin   Bufferin with Codeine Fenoprofen Fenoprofen Pabalate or Pabalate-SF   Buffets II Flogesic Panagesic   Buffinol plain or Extra Strength Florinal or Florinal with Codeine Panwarfarin   Buf-Tabs Flurbiprofen Penicillamine   Butalbital Compound Four-way cold tablets Penicillin   Butazolidin Fragmin Pepto-Bismol   Carbenicillin Geminisyn Percodan   Carna Arthritis Reliever Geopen Persantine   Carprofen Gold's salt Persistin   Chloramphenicol Goody's Phenylbutazone   Chloromycetin Haltrain Piroxlcam   Clmetidine heparin Plaquenil   Cllnoril Hyco-pap Ponstel   Clofibrate Hydroxy chloroquine Propoxyphen         Before stopping any of these medications, be sure to consult the physician who ordered them.  Some, such as Coumadin (Warfarin) are ordered to prevent or treat serious conditions such as "deep thrombosis", "pumonary embolisms", and other heart problems.  The amount of time that you may need off of the medication may also vary with the medication and the reason for which you were taking it.  If you are taking any of these medications, please make sure you notify your pain physician before you undergo any  procedures.         Facet Joint Block The facet joints connect the bones of the spine (vertebrae). They make it possible for you to bend, twist, and make other movements with your spine. They also keep you from bending too far, twisting too far, and making other excessive movements. A facet joint block is a procedure where a numbing medicine (anesthetic) is injected into a facet joint. Often, a type of anti-inflammatory medicine called a steroid is also injected. A facet joint block may be done to diagnose neck or back pain. If the pain gets better after a facet joint block, it means the pain is probably coming from the facet joint. If the pain does not get better, it means the pain is probably not coming   from the facet joint. A facet joint block may also be done to relieve neck or back pain caused by an inflamed facet joint. A facet joint block is only done to relieve pain if the pain does not improve with other methods, such as medicine, exercise programs, and physical therapy. Tell a health care provider about:  Any allergies you have.  All medicines you are taking, including vitamins, herbs, eye drops, creams, and over-the-counter medicines.  Any problems you or family members have had with anesthetic medicines.  Any blood disorders you have.  Any surgeries you have had.  Any medical conditions you have.  Whether you are pregnant or may be pregnant. What are the risks? Generally, this is a safe procedure. However, problems may occur, including:  Bleeding.  Injury to a nerve near the injection site.  Pain at the injection site.  Weakness or numbness in areas controlled by nerves near the injection site.  Infection.  Temporary fluid retention.  Allergic reactions to medicines or dyes.  Injury to other structures or organs near the injection site.  What happens before the procedure?  Follow instructions from your health care provider about eating or drinking  restrictions.  Ask your health care provider about: ? Changing or stopping your regular medicines. This is especially important if you are taking diabetes medicines or blood thinners. ? Taking medicines such as aspirin and ibuprofen. These medicines can thin your blood. Do not take these medicines before your procedure if your health care provider instructs you not to.  Do not take any new dietary supplements or medicines without asking your health care provider first.  Plan to have someone take you home after the procedure. What happens during the procedure?  You may need to remove your clothing and dress in an open-back gown.  The procedure will be done while you are lying on an X-ray table. You will most likely be asked to lie on your stomach, but you may be asked to lie in a different position if an injection will be made in your neck.  Machines will be used to monitor your oxygen levels, heart rate, and blood pressure.  If an injection will be made in your neck, an IV tube will be inserted into one of your veins. Fluids and medicine will flow directly into your body through the IV tube.  The area over the facet joint where the injection will be made will be cleaned with soap. The surrounding skin will be covered with clean drapes.  A numbing medicine (local anesthetic) will be applied to your skin. Your skin may sting or burn for a moment.  A video X-ray machine (fluoroscopy) will be used to locate the joint. In some cases, a CT scan may be used.  A contrast dye may be injected into the facet joint area to help locate the joint.  When the joint is located, an anesthetic will be injected into the joint through the needle.  Your health care provider will ask you whether you feel pain relief. If you do feel relief, a steroid may be injected to provide pain relief for a longer period of time. If you do not feel relief or feel only partial relief, additional injections of an anesthetic  may be made in other facet joints.  The needle will be removed.  Your skin will be cleaned.  A bandage (dressing) will be applied over each injection site. The procedure may vary among health care providers and hospitals. What happens   after the procedure?  You will be observed for 15-30 minutes before being allowed to go home. This information is not intended to replace advice given to you by your health care provider. Make sure you discuss any questions you have with your health care provider. Document Released: 02/27/2007 Document Revised: 11/09/2015 Document Reviewed: 07/04/2015 [employer] Interactive Patient Education  2018 [employer]  

## 2017-03-28 NOTE — Progress Notes (Signed)
Nursing Pain Medication Assessment:  Safety precautions to be maintained throughout the outpatient stay will include: orient to surroundings, keep bed in low position, maintain call bell within reach at all times, provide assistance with transfer out of bed and ambulation.  Medication Inspection Compliance: Pill count conducted under aseptic conditions, in front of the patient. Neither the pills nor the bottle was removed from the patient's sight at any time. Once count was completed pills were immediately returned to the patient in their original bottle.  Medication: Tramadol (Ultram) Pill/Patch Count: 70 of 120 pills remain Pill/Patch Appearance: Markings consistent with prescribed medication Bottle Appearance: Standard pharmacy container. Clearly labeled. Filled Date: 05 / 17 / 2018 Last Medication intake:  Today   Patient states a friend gave her some Percocet and she took them because the pain was so bad.  States they did help.

## 2017-03-29 NOTE — Progress Notes (Signed)
Subjective:  Patient ID: Tami Hopkins, female    DOB: 1961/04/12  Age: 56 y.o. MRN: [id number]  CC: Back Pain (low, tailbone); Hip Pain (left); and Shoulder Pain (bilateral)   Service Provided on Last Visit: Evaluation (new patient eval) PROCEDURE:None  HPI Tami Hopkins presents for reevaluation last seen in April. Unfortunately she recently suffered a fall causing some severe tailbone pain and bilateral low back pain. She does not feels she fractured anything but has been sore. Her strength in lower extremities and bowel bladder function have been okay. She has had some more intense low back pain and she is describing a pain with standing and extension at the low back which is worse with prolonged standing. She has been taking Ultram and this gives her some mild improvement in her low back pain however with the combination of the scoliosis and the recent fall her pain is been very severe. History Leafy has a past medical history of Anxiety; Asthma; Bursitis of hip bilateral; Hyperlipidemia; PTSD (post-traumatic stress disorder); and Scoliosis.   She has a past surgical history that includes Cesarean section; Endometrial ablation; Abdominal hysterectomy; Nose surgery; and Cholecystectomy.   Her family history includes Alcohol abuse in her father; Arthritis in her mother; Asthma in her mother; Depression in her mother; Diabetes in her sister; Heart attack in her father; Hypertension in her sister; Obesity in her sister.She reports that she has been smoking Cigarettes.  She started smoking about 21 years ago. She has been smoking about 0.25 packs per day. She has never used smokeless tobacco. She reports that she does not drink alcohol or use drugs.  No results found for this or any previous visit.  No results found for: TOXASSSELUR  Outpatient Medications Prior to Visit  Medication Sig Dispense Refill  . baclofen (LIORESAL) 10 MG tablet Take 1 tablet (10 mg total) by mouth at  bedtime. 30 each 1  . clonazePAM (KLONOPIN) 0.5 MG tablet Take 0.25 mg by mouth 2 (two) times daily as needed for anxiety.    . diphenoxylate-atropine (LOMOTIL) 2.5-0.025 MG per tablet Take 1 tablet by mouth 4 (four) times daily as needed for diarrhea or loose stools.    Marland Kitchen FLOVENT HFA 110 MCG/ACT inhaler     . fluticasone (FLONASE) 50 MCG/ACT nasal spray Place 1 spray into both nostrils 2 (two) times daily.    . furosemide (LASIX) 40 MG tablet Take 1 tablet by mouth daily.    Marland Kitchen KLOR-CON 10 10 MEQ tablet Take 10 mEq by mouth daily.  5  . montelukast (SINGULAIR) 10 MG tablet Take 10 mg by mouth at bedtime.    Marland Kitchen PROAIR HFA 108 (90 BASE) MCG/ACT inhaler Inhale 2 puffs into the lungs every 4 (four) hours as needed.    . promethazine (PHENERGAN) 25 MG tablet Take 50 mg by mouth as needed.     . risperiDONE (RISPERDAL) 1 MG tablet Take 1 tablet (1 mg total) by mouth at bedtime. 30 tablet 2  . ZETIA 10 MG tablet Take 10 mg by mouth daily.  5  . zolpidem (AMBIEN) 10 MG tablet Take 10 mg by mouth at bedtime as needed for sleep.    . traMADol (ULTRAM) 50 MG tablet Take 1 tablet (50 mg total) by mouth 4 (four) times daily. 120 tablet 1  . ALPRAZolam (XANAX) 0.5 MG tablet Take 1 tablet (0.5 mg total) by mouth daily. (Patient not taking: Reported on 02/05/2017) 30 tablet 0  . busPIRone (BUSPAR) 10 MG tablet Take  1 tablet (10 mg total) by mouth 3 (three) times daily. (Patient not taking: Reported on 02/05/2017) 90 tablet 1  . mirtazapine (REMERON) 15 MG tablet Take 1 tablet (15 mg total) by mouth at bedtime. (Patient not taking: Reported on 02/05/2017) 30 tablet 1  . omeprazole (PRILOSEC) 20 MG capsule TAKE 1 CAPSULE (20 MG TOTAL) BY MOUTH DAILY.  3   No facility-administered medications prior to visit.    Lab Results  Component Value Date   WBC 12.0 (H) 05/11/2015   HGB 12.1 05/11/2015   HCT 36.1 05/11/2015   PLT 223 05/11/2015   GLUCOSE 118 (H) 05/14/2015   ALT 148 (H) 05/14/2015   AST 170 (H)  05/14/2015   NA 138 05/14/2015   K 3.7 05/14/2015   CL 105 05/14/2015   CREATININE 0.72 05/14/2015   BUN 11 05/14/2015   CO2 25 05/14/2015   TSH 0.912 05/11/2015    --------------------------------------------------------------------------------------------------------------------- Mm Digital Screening Bilateral  Result Date: 12/24/2016 CLINICAL DATA:  Screening. EXAM: DIGITAL SCREENING BILATERAL MAMMOGRAM WITH CAD COMPARISON:  Previous exam(s). ACR Breast Density Category b: There are scattered areas of fibroglandular density. FINDINGS: There are no findings suspicious for malignancy. Images were processed with CAD. IMPRESSION: No mammographic evidence of malignancy. A result letter of this screening mammogram will be mailed directly to the patient. RECOMMENDATION: Screening mammogram in one year. (Code:SM-B-01Y) BI-RADS CATEGORY  1: Negative. Electronically Signed   By: Lillia Mountain M.D.   On: 12/24/2016 12:47       ---------------------------------------------------------------------------------------------------------------------- Past Medical History:  Diagnosis Date  . Anxiety   . Asthma   . Bursitis of hip bilateral   . Hyperlipidemia   . PTSD (post-traumatic stress disorder)   . Scoliosis     Past Surgical History:  Procedure Laterality Date  . ABDOMINAL HYSTERECTOMY    . CESAREAN SECTION    . CHOLECYSTECTOMY    . ENDOMETRIAL ABLATION     x3  . NOSE SURGERY      Family History  Problem Relation Age of Onset  . Asthma Mother   . Arthritis Mother   . Depression Mother   . Heart attack Father   . Alcohol abuse Father   . Hypertension Sister   . Diabetes Sister   . Obesity Sister   . Heart failure Neg Hx   . Breast cancer Neg Hx     Social History  Substance Use Topics  . Smoking status: Current Every Day Smoker    Packs/day: 0.25    Types: Cigarettes    Start date: 08/18/1995  . Smokeless tobacco: Never Used  . Alcohol use No     Comment: rare     ---------------------------------------------------------------------------------------------------------------------  BP 108/76   Pulse 100   Temp 98.2 F (36.8 C)   Resp 18   Ht 5\' 8"  (1.727 m)   Wt 178 lb (80.7 kg)   SpO2 99%   BMI 27.06 kg/m    BP Readings from Last 3 Encounters:  03/28/17 108/76  02/05/17 117/76  05/15/15 (!) 110/55     Wt Readings from Last 3 Encounters:  03/28/17 178 lb (80.7 kg)  02/05/17 178 lb (80.7 kg)  05/11/15 172 lb 1.6 oz (78.1 kg)     ----------------------------------------------------------------------------------------------------------------------  ROS Review of Systems  Cardiac: Negative Pulmonary: Occasional wheezing but no shortness of breath at present Psychologic: Anxiety history of panic attacks and PTSD with no suicidal or homicidal ideation or history of drug abuse. No history of alcoholism.   Objective:  BP 108/76   Pulse 100   Temp 98.2 F (36.8 C)   Resp 18   Ht 5\' 8"  (1.727 m)   Wt 178 lb (80.7 kg)   SpO2 99%   BMI 27.06 kg/m   Physical Exam Patient is a pleasant 56 year old white female with no acute distress. She ambulates reasonably well. She has from seated to standing with slight discomfort in the low back. X line her heart is regular rate and rhythm without murmur Lungs are clear to auscultation GI deferred Inspection low back and mid thoracic back reveals a kyphosis at approximately the mid to lower thoracic level with evidence of scoliotic change. There is diffuse paraspinous muscle tenderness in both the mid thoracic and lumbar region. She also has some pain on extension at the low back in the standing position with left and right lateral rotation. This seems to be worse with right lateral rotation and extension.     Assessment & Plan:   Deira was seen today for back pain, hip pain and shoulder pain.  Diagnoses and all orders for this visit:  DDD (degenerative disc disease),  lumbar  Facet arthritis of lumbosacral region ([place]) -     LUMBAR FACET(MEDIAL BRANCH NERVE BLOCK) MBNB; Future  Musculoskeletal back pain  Other orders -     HYDROcodone-acetaminophen (NORCO/VICODIN) 5-325 MG tablet; Take 1 tablet by mouth every 6 (six) hours as needed for moderate pain or severe pain. -     traMADol (ULTRAM) 50 MG tablet; Take 1 tablet (50 mg total) by mouth 4 (four) times daily.     ----------------------------------------------------------------------------------------------------------------------  Problem List Items Addressed This Visit    None    Visit Diagnoses    DDD (degenerative disc disease), lumbar    -  Primary   Relevant Medications   HYDROcodone-acetaminophen (NORCO/VICODIN) 5-325 MG tablet   traMADol (ULTRAM) 50 MG tablet   Facet arthritis of lumbosacral region ([facility])       Relevant Medications   HYDROcodone-acetaminophen (NORCO/VICODIN) 5-325 MG tablet   traMADol (ULTRAM) 50 MG tablet   Other Relevant Orders   LUMBAR FACET(MEDIAL BRANCH NERVE BLOCK) MBNB   Musculoskeletal back pain       Relevant Medications   HYDROcodone-acetaminophen (NORCO/VICODIN) 5-325 MG tablet   traMADol (ULTRAM) 50 MG tablet      ----------------------------------------------------------------------------------------------------------------------  1. DDD (degenerative disc disease), lumbar   2. Adolescent idiopathic scoliosis of thoracolumbar region  3. Chronic bilateral thoracic back pain 1 her to continue her muscle relaxant and I'm going to allow her Vicodin to be taken 1 tablet up to twice a day for the short-term to get her through this acute exacerbation in her low back pain. She can continue her Ultram at night time and then moved back to her usual Ultram scheduled once her pain is under better control. 4. Facet arthritis of lumbosacral region [facility])  Based on clinical findings and going to schedule her for right side diagnostic facet block secondary to  her clinical findings today and we'll plan on doing this in the next month or so. Gone over the risks and benefits of the procedure and her questions about answered.  5. Musculoskeletal back pain Continue with back stretching strengthening exercises. - ToxASSURE Select 13 (MW), Urine    ----------------------------------------------------------------------------------------------------------------------  I have discontinued Ms. Free's omeprazole, busPIRone, ALPRAZolam, mirtazapine, and traZODone. I am also having her start on HYDROcodone-acetaminophen. Additionally, I am having her maintain her diphenoxylate-atropine, montelukast, furosemide, fluticasone, PROAIR HFA, ZETIA, KLOR-CON 10, promethazine, FLOVENT  HFA, risperiDONE, zolpidem, clonazePAM, baclofen, and traMADol.   Meds ordered this encounter  Medications  . DISCONTD: traZODone (DESYREL) 50 MG tablet    Sig: Take by mouth.  . DISCONTD: baclofen (LIORESAL) 10 MG tablet    Sig: Take by mouth.  . DISCONTD: furosemide (LASIX) 40 MG tablet    Sig: TAKE 1 TABLET (40 MG TOTAL) BY MOUTH DAILY.  Marland Kitchen DISCONTD: montelukast (SINGULAIR) 10 MG tablet    Sig: Take by mouth.  Marland Kitchen HYDROcodone-acetaminophen (NORCO/VICODIN) 5-325 MG tablet    Sig: Take 1 tablet by mouth every 6 (six) hours as needed for moderate pain or severe pain.    Dispense:  40 tablet    Refill:  0  . traMADol (ULTRAM) 50 MG tablet    Sig: Take 1 tablet (50 mg total) by mouth 4 (four) times daily.    Dispense:  120 tablet    Refill:  1       Follow-up: Return in about 1 month (around 04/27/2017) for procedure.    Molli Barrows, MD 10:54 AM  The [place] practitioner database for opioid medications on this patient has been reviewed by me and my staff   Greater than 50% of the total encounter time was spent in counseling and / or coordination of care.     This dictation was performed utilizing Systems analyst.  Please excuse any unintentional or  mistaken typographical errors as a result.

## 2017-05-07 ENCOUNTER — Ambulatory Visit
Admission: RE | Admit: 2017-05-07 | Discharge: 2017-05-07 | Disposition: A | Discharge status: Home / Self Care | Payer: Medicare Other | Source: Ambulatory Visit | Attending: Anesthesiology | Admitting: Anesthesiology

## 2017-05-07 ENCOUNTER — Ambulatory Visit (HOSPITAL_BASED_OUTPATIENT_CLINIC_OR_DEPARTMENT_OTHER): Payer: Medicare Other | Admitting: Anesthesiology

## 2017-05-07 ENCOUNTER — Encounter: Payer: Self-pay | Admitting: Anesthesiology

## 2017-05-07 ENCOUNTER — Other Ambulatory Visit: Payer: Self-pay | Admitting: Anesthesiology

## 2017-05-07 VITALS — BP 111/79 | HR 81 | Temp 96.2°F | Resp 15 | Ht 68.0 in | Wt 178.0 lb

## 2017-05-07 DIAGNOSIS — F4310 Post-traumatic stress disorder, unspecified: Secondary | ICD-10-CM | POA: Insufficient documentation

## 2017-05-07 DIAGNOSIS — M7072 Other bursitis of hip, left hip: Secondary | ICD-10-CM | POA: Insufficient documentation

## 2017-05-07 DIAGNOSIS — M7071 Other bursitis of hip, right hip: Secondary | ICD-10-CM | POA: Insufficient documentation

## 2017-05-07 DIAGNOSIS — F419 Anxiety disorder, unspecified: Secondary | ICD-10-CM | POA: Diagnosis not present

## 2017-05-07 DIAGNOSIS — M419 Scoliosis, unspecified: Secondary | ICD-10-CM | POA: Insufficient documentation

## 2017-05-07 DIAGNOSIS — R52 Pain, unspecified: Secondary | ICD-10-CM

## 2017-05-07 DIAGNOSIS — E785 Hyperlipidemia, unspecified: Secondary | ICD-10-CM | POA: Diagnosis not present

## 2017-05-07 DIAGNOSIS — F17210 Nicotine dependence, cigarettes, uncomplicated: Secondary | ICD-10-CM | POA: Diagnosis not present

## 2017-05-07 DIAGNOSIS — M4687 Other specified inflammatory spondylopathies, lumbosacral region: Secondary | ICD-10-CM | POA: Diagnosis present

## 2017-05-07 DIAGNOSIS — M549 Dorsalgia, unspecified: Secondary | ICD-10-CM

## 2017-05-07 DIAGNOSIS — M545 Low back pain: Secondary | ICD-10-CM | POA: Diagnosis not present

## 2017-05-07 DIAGNOSIS — M4697 Unspecified inflammatory spondylopathy, lumbosacral region: Secondary | ICD-10-CM | POA: Diagnosis present

## 2017-05-07 DIAGNOSIS — M41125 Adolescent idiopathic scoliosis, thoracolumbar region: Secondary | ICD-10-CM | POA: Insufficient documentation

## 2017-05-07 DIAGNOSIS — J45909 Unspecified asthma, uncomplicated: Secondary | ICD-10-CM | POA: Insufficient documentation

## 2017-05-07 DIAGNOSIS — Z79899 Other long term (current) drug therapy: Secondary | ICD-10-CM | POA: Diagnosis not present

## 2017-05-07 DIAGNOSIS — M47817 Spondylosis without myelopathy or radiculopathy, lumbosacral region: Secondary | ICD-10-CM

## 2017-05-07 DIAGNOSIS — G8929 Other chronic pain: Secondary | ICD-10-CM | POA: Diagnosis not present

## 2017-05-07 DIAGNOSIS — M5136 Other intervertebral disc degeneration, lumbar region: Secondary | ICD-10-CM

## 2017-05-07 MED ORDER — MIDAZOLAM HCL 5 MG/5ML IJ SOLN
INTRAMUSCULAR | Status: AC
  Filled 2017-05-07: qty 5

## 2017-05-07 MED ORDER — LACTATED RINGERS IV SOLN
1000.0000 mL | INTRAVENOUS | Status: DC
  Administered 2017-05-07: 1000 mL via INTRAVENOUS

## 2017-05-07 MED ORDER — TRIAMCINOLONE ACETONIDE 40 MG/ML IJ SUSP
INTRAMUSCULAR | Status: AC
  Filled 2017-05-07: qty 1

## 2017-05-07 MED ORDER — MIDAZOLAM HCL 2 MG/2ML IJ SOLN
5.0000 mg | Freq: Once | INTRAMUSCULAR | Status: AC
  Administered 2017-05-07: 5 mg via INTRAVENOUS

## 2017-05-07 MED ORDER — ROPIVACAINE HCL 2 MG/ML IJ SOLN
10.0000 mL | Freq: Once | INTRAMUSCULAR | Status: AC
  Administered 2017-05-07: 10 mL via EPIDURAL

## 2017-05-07 MED ORDER — SODIUM CHLORIDE 0.9% FLUSH: 10.0000 mL | Freq: Once | INTRAVENOUS | Status: DC

## 2017-05-07 MED ORDER — TRIAMCINOLONE ACETONIDE 40 MG/ML IJ SUSP
40.0000 mg | Freq: Once | INTRAMUSCULAR | Status: AC
  Administered 2017-05-07: 40 mg

## 2017-05-07 MED ORDER — ROPIVACAINE HCL 2 MG/ML IJ SOLN
INTRAMUSCULAR | Status: AC
  Filled 2017-05-07: qty 10

## 2017-05-07 MED ORDER — LIDOCAINE HCL (PF) 1 % IJ SOLN: 5.0000 mL | Freq: Once | INTRAMUSCULAR | Status: DC

## 2017-05-07 NOTE — Patient Instructions (Signed)
Pain Management Discharge Instructions  General Discharge Instructions :  If you need to reach your doctor call: Monday-Friday 8:00 am - 4:00 pm at [phone] or toll free [phone].  After clinic hours [phone] to have operator reach doctor.  Bring all of your medication bottles to all your appointments in the pain clinic.  To cancel or reschedule your appointment with Pain Management please remember to call 24 hours in advance to avoid a fee.  Refer to the educational materials which you have been given on: General Risks, I had my Procedure. Discharge Instructions, Post Sedation.  Post Procedure Instructions:  The drugs you were given will stay in your system until tomorrow, so for the next 24 hours you should not drive, make any legal decisions or drink any alcoholic beverages.  You may eat anything you prefer, but it is better to start with liquids then soups and crackers, and gradually work up to solid foods.  Please notify your doctor immediately if you have any unusual bleeding, trouble breathing or pain that is not related to your normal pain.  Depending on the type of procedure that was done, some parts of your body may feel week and/or numb.  This usually clears up by tonight or the next day.  Walk with the use of an assistive device or accompanied by an adult for the 24 hours.  You may use ice on the affected area for the first 24 hours.  Put ice in a Ziploc bag and cover with a towel and place against area 15 minutes on 15 minutes off.  You may switch to heat after 24 hours.GENERAL RISKS AND COMPLICATIONS  What are the risk, side effects and possible complications? Generally speaking, most procedures are safe.  However, with any procedure there are risks, side effects, and the possibility of complications.  The risks and complications are dependent upon the sites that are lesioned, or the type of nerve block to be performed.  The closer the procedure is to the spine,  the more serious the risks are.  Great care is taken when placing the radio frequency needles, block needles or lesioning probes, but sometimes complications can occur. 1. Infection: Any time there is an injection through the skin, there is a risk of infection.  This is why sterile conditions are used for these blocks.  There are four possible types of infection. 1. Localized skin infection. 2. Central Nervous System Infection-This can be in the form of Meningitis, which can be deadly. 3. Epidural Infections-This can be in the form of an epidural abscess, which can cause pressure inside of the spine, causing compression of the spinal cord with subsequent paralysis. This would require an emergency surgery to decompress, and there are no guarantees that the patient would recover from the paralysis. 4. Discitis-This is an infection of the intervertebral discs.  It occurs in about 1% of discography procedures.  It is difficult to treat and it may lead to surgery.        2. Pain: the needles have to go through skin and soft tissues, will cause soreness.       3. Damage to internal structures:  The nerves to be lesioned may be near blood vessels or    other nerves which can be potentially damaged.       4. Bleeding: Bleeding is more common if the patient is taking blood thinners such as  aspirin, Coumadin, Ticiid, Plavix, etc., or if he/she have some genetic predisposition  such as   hemophilia. Bleeding into the spinal canal can cause compression of the spinal  cord with subsequent paralysis.  This would require an emergency surgery to  decompress and there are no guarantees that the patient would recover from the  paralysis.       5. Pneumothorax:  Puncturing of a lung is a possibility, every time a needle is introduced in  the area of the chest or upper back.  Pneumothorax refers to free air around the  collapsed lung(s), inside of the thoracic cavity (chest cavity).  Another two possible  complications  related to a similar event would include: Hemothorax and Chylothorax.   These are variations of the Pneumothorax, where instead of air around the collapsed  lung(s), you may have blood or chyle, respectively.       6. Spinal headaches: They may occur with any procedures in the area of the spine.       7. Persistent CSF (Cerebro-Spinal Fluid) leakage: This is a rare problem, but may occur  with prolonged intrathecal or epidural catheters either due to the formation of a fistulous  track or a dural tear.       8. Nerve damage: By working so close to the spinal cord, there is always a possibility of  nerve damage, which could be as serious as a permanent spinal cord injury with  paralysis.       9. Death:  Although rare, severe deadly allergic reactions known as "Anaphylactic  reaction" can occur to any of the medications used.      10. Worsening of the symptoms:  We can always make thing worse.  What are the chances of something like this happening? Chances of any of this occuring are extremely low.  By statistics, you have more of a chance of getting killed in a motor vehicle accident: while driving to the hospital than any of the above occurring .  Nevertheless, you should be aware that they are possibilities.  In general, it is similar to taking a shower.  Everybody knows that you can slip, hit your head and get killed.  Does that mean that you should not shower again?  Nevertheless always keep in mind that statistics do not mean anything if you happen to be on the wrong side of them.  Even if a procedure has a 1 (one) in a 1,000,000 (million) chance of going wrong, it you happen to be that one..Also, keep in mind that by statistics, you have more of a chance of having something go wrong when taking medications.  Who should not have this procedure? If you are on a blood thinning medication (e.g. Coumadin, Plavix, see list of "Blood Thinners"), or if you have an active infection going on, you should not  have the procedure.  If you are taking any blood thinners, please inform your physician.  How should I prepare for this procedure?  Do not eat or drink anything at least six hours prior to the procedure.  Bring a driver with you .  It cannot be a taxi.  Come accompanied by an adult that can drive you back, and that is strong enough to help you if your legs get weak or numb from the local anesthetic.  Take all of your medicines the morning of the procedure with just enough water to swallow them.  If you have diabetes, make sure that you are scheduled to have your procedure done first thing in the morning, whenever possible.  If you have diabetes,   take only half of your insulin dose and notify our nurse that you have done so as soon as you arrive at the clinic.  If you are diabetic, but only take blood sugar pills (oral hypoglycemic), then do not take them on the morning of your procedure.  You may take them after you have had the procedure.  Do not take aspirin or any aspirin-containing medications, at least eleven (11) days prior to the procedure.  They may prolong bleeding.  Wear loose fitting clothing that may be easy to take off and that you would not mind if it got stained with Betadine or blood.  Do not wear any jewelry or perfume  Remove any nail coloring.  It will interfere with some of our monitoring equipment.  NOTE: Remember that this is not meant to be interpreted as a complete list of all possible complications.  Unforeseen problems may occur.  BLOOD THINNERS The following drugs contain aspirin or other products, which can cause increased bleeding during surgery and should not be taken for 2 weeks prior to and 1 week after surgery.  If you should need take something for relief of minor pain, you may take acetaminophen which is found in Tylenol,m Datril, Anacin-3 and Panadol. It is not blood thinner. The products listed below are.  Do not take any of the products listed below  in addition to any listed on your instruction sheet.  A.P.C or A.P.C with Codeine Codeine Phosphate Capsules #3 Ibuprofen Ridaura  ABC compound Congesprin Imuran rimadil  Advil Cope Indocin Robaxisal  Alka-Seltzer Effervescent Pain Reliever and Antacid Coricidin or Coricidin-D  Indomethacin Rufen  Alka-Seltzer plus Cold Medicine Cosprin Ketoprofen S-A-C Tablets  Anacin Analgesic Tablets or Capsules Coumadin Korlgesic Salflex  Anacin Extra Strength Analgesic tablets or capsules CP-2 Tablets Lanoril Salicylate  Anaprox Cuprimine Capsules Levenox Salocol  Anexsia-D Dalteparin Magan Salsalate  Anodynos Darvon compound Magnesium Salicylate Sine-off  Ansaid Dasin Capsules Magsal Sodium Salicylate  Anturane Depen Capsules Marnal Soma  APF Arthritis pain formula Dewitt's Pills Measurin Stanback  Argesic Dia-Gesic Meclofenamic Sulfinpyrazone  Arthritis Bayer Timed Release Aspirin Diclofenac Meclomen Sulindac  Arthritis pain formula Anacin Dicumarol Medipren Supac  Analgesic (Safety coated) Arthralgen Diffunasal Mefanamic Suprofen  Arthritis Strength Bufferin Dihydrocodeine Mepro Compound Suprol  Arthropan liquid Dopirydamole Methcarbomol with Aspirin Synalgos  ASA tablets/Enseals Disalcid Micrainin Tagament  Ascriptin Doan's Midol Talwin  Ascriptin A/D Dolene Mobidin Tanderil  Ascriptin Extra Strength Dolobid Moblgesic Ticlid  Ascriptin with Codeine Doloprin or Doloprin with Codeine Momentum Tolectin  Asperbuf Duoprin Mono-gesic Trendar  Aspergum Duradyne Motrin or Motrin IB Triminicin  Aspirin plain, buffered or enteric coated Durasal Myochrisine Trigesic  Aspirin Suppositories Easprin Nalfon Trillsate  Aspirin with Codeine Ecotrin Regular or Extra Strength Naprosyn Uracel  Atromid-S Efficin Naproxen Ursinus  Auranofin Capsules Elmiron Neocylate Vanquish  Axotal Emagrin Norgesic Verin  Azathioprine Empirin or Empirin with Codeine Normiflo Vitamin E  Azolid Emprazil Nuprin Voltaren  Bayer  Aspirin plain, buffered or children's or timed BC Tablets or powders Encaprin Orgaran Warfarin Sodium  Buff-a-Comp Enoxaparin Orudis Zorpin  Buff-a-Comp with Codeine Equegesic Os-Cal-Gesic   Buffaprin Excedrin plain, buffered or Extra Strength Oxalid   Bufferin Arthritis Strength Feldene Oxphenbutazone   Bufferin plain or Extra Strength Feldene Capsules Oxycodone with Aspirin   Bufferin with Codeine Fenoprofen Fenoprofen Pabalate or Pabalate-SF   Buffets II Flogesic Panagesic   Buffinol plain or Extra Strength Florinal or Florinal with Codeine Panwarfarin   Buf-Tabs Flurbiprofen Penicillamine   Butalbital Compound Four-way cold tablets   Penicillin   Butazolidin Fragmin Pepto-Bismol   Carbenicillin Geminisyn Percodan   Carna Arthritis Reliever Geopen Persantine   Carprofen Gold's salt Persistin   Chloramphenicol Goody's Phenylbutazone   Chloromycetin Haltrain Piroxlcam   Clmetidine heparin Plaquenil   Cllnoril Hyco-pap Ponstel   Clofibrate Hydroxy chloroquine Propoxyphen         Before stopping any of these medications, be sure to consult the physician who ordered them.  Some, such as Coumadin (Warfarin) are ordered to prevent or treat serious conditions such as "deep thrombosis", "pumonary embolisms", and other heart problems.  The amount of time that you may need off of the medication may also vary with the medication and the reason for which you were taking it.  If you are taking any of these medications, please make sure you notify your pain physician before you undergo any procedures.         Facet Joint Block, Care After Refer to this sheet in the next few weeks. These instructions provide you with information about caring for yourself after your procedure. Your health care provider may also give you more specific instructions. Your treatment has been planned according to current medical practices, but problems sometimes occur. Call your health care provider if you have any  problems or questions after your procedure. What can I expect after the procedure? After the procedure, it is common to have:  Some tenderness over the injection sites for 2 days after the procedure.  A temporary increase in blood sugar if you have diabetes.  Follow these instructions at home:  Keep track of the amount of pain relief you feel and how long it lasts.  Take over-the-counter and prescription medicines only as told by your health care provider. You may need to limit pain medicine within the first 4-6 hours after the procedure.  Remove your bandages (dressings) the morning after the procedure.  For the first 24 hours after the procedure: ? Do not apply heat near or over the injection sites. ? Do not take a bath or soak in water, such as in a pool or lake. ? Do not drive or operate heavy machinery unless approved by your health care provider. ? Avoid activities that require a lot of energy.  If the injection site is tender, try applying ice to the area. To do this: ? Put ice in a plastic bag. ? Place a towel between your skin and the bag. ? Leave the ice on for 20 minutes, 2-3 times a day.  Keep all follow-up visits as told by your health care provider. This is important. Contact a health care provider if:  Fluid is coming from an injection site.  There is significant bleeding or swelling at an injection site.  You have diabetes and your blood sugar is above 180 mg/dL. Get help right away if:  You have a fever.  You have worsening pain or swelling around an injection site.  There are red streaks around an injection site.  You develop severe pain that is not controlled by your medicines.  You develop a headache, stiff neck, nausea, or vomiting.  Your eyes become very sensitive to light.  You have weakness, paralysis, or tingling in your arms or legs that was not present before the procedure.  You have difficulty urinating or breathing. This information is  not intended to replace advice given to you by your health care provider. Make sure you discuss any questions you have with your health care provider. Document Released: 09/24/2012   Document Revised: 02/22/2016 Document Reviewed: 07/04/2015 Elsevier Interactive Patient Education  2018 [employer] Facet Joint Block The facet joints connect the bones of the spine (vertebrae). They make it possible for you to bend, twist, and make other movements with your spine. They also keep you from bending too far, twisting too far, and making other excessive movements. A facet joint block is a procedure where a numbing medicine (anesthetic) is injected into a facet joint. Often, a type of anti-inflammatory medicine called a steroid is also injected. A facet joint block may be done to diagnose neck or back pain. If the pain gets better after a facet joint block, it means the pain is probably coming from the facet joint. If the pain does not get better, it means the pain is probably not coming from the facet joint. A facet joint block may also be done to relieve neck or back pain caused by an inflamed facet joint. A facet joint block is only done to relieve pain if the pain does not improve with other methods, such as medicine, exercise programs, and physical therapy. Tell a health care provider about:  Any allergies you have.  All medicines you are taking, including vitamins, herbs, eye drops, creams, and over-the-counter medicines.  Any problems you or family members have had with anesthetic medicines.  Any blood disorders you have.  Any surgeries you have had.  Any medical conditions you have.  Whether you are pregnant or may be pregnant. What are the risks? Generally, this is a safe procedure. However, problems may occur, including:  Bleeding.  Injury to a nerve near the injection site.  Pain at the injection site.  Weakness or numbness in areas controlled by nerves near the injection  site.  Infection.  Temporary fluid retention.  Allergic reactions to medicines or dyes.  Injury to other structures or organs near the injection site.  What happens before the procedure?  Follow instructions from your health care provider about eating or drinking restrictions.  Ask your health care provider about: ? Changing or stopping your regular medicines. This is especially important if you are taking diabetes medicines or blood thinners. ? Taking medicines such as aspirin and ibuprofen. These medicines can thin your blood. Do not take these medicines before your procedure if your health care provider instructs you not to.  Do not take any new dietary supplements or medicines without asking your health care provider first.  Plan to have someone take you home after the procedure. What happens during the procedure?  You may need to remove your clothing and dress in an open-back gown.  The procedure will be done while you are lying on an X-ray table. You will most likely be asked to lie on your stomach, but you may be asked to lie in a different position if an injection will be made in your neck.  Machines will be used to monitor your oxygen levels, heart rate, and blood pressure.  If an injection will be made in your neck, an IV tube will be inserted into one of your veins. Fluids and medicine will flow directly into your body through the IV tube.  The area over the facet joint where the injection will be made will be cleaned with soap. The surrounding skin will be covered with clean drapes.  A numbing medicine (local anesthetic) will be applied to your skin. Your skin may sting or burn for a moment.  A video X-ray machine (fluoroscopy) will be used to   locate the joint. In some cases, a CT scan may be used.  A contrast dye may be injected into the facet joint area to help locate the joint.  When the joint is located, an anesthetic will be injected into the joint through the  needle.  Your health care provider will ask you whether you feel pain relief. If you do feel relief, a steroid may be injected to provide pain relief for a longer period of time. If you do not feel relief or feel only partial relief, additional injections of an anesthetic may be made in other facet joints.  The needle will be removed.  Your skin will be cleaned.  A bandage (dressing) will be applied over each injection site. The procedure may vary among health care providers and hospitals. What happens after the procedure?  You will be observed for 15-30 minutes before being allowed to go home. This information is not intended to replace advice given to you by your health care provider. Make sure you discuss any questions you have with your health care provider. Document Released: 02/27/2007 Document Revised: 11/09/2015 Document Reviewed: 07/04/2015 Elsevier Interactive Patient Education  2018 [employer]  

## 2017-05-07 NOTE — Progress Notes (Signed)
Nursing Pain Medication Assessment:  Safety precautions to be maintained throughout the outpatient stay will include: orient to surroundings, keep bed in low position, maintain call bell within reach at all times, provide assistance with transfer out of bed and ambulation.  Medication Inspection Compliance: Pill count conducted under aseptic conditions, in front of the patient. Neither the pills nor the bottle was removed from the patient's sight at any time. Once count was completed pills were immediately returned to the patient in their original bottle.  Medication: Tramadol (Ultram) Pill/Patch Count: 120 of 120 pills remain Pill/Patch Appearance: Markings consistent with prescribed medication Bottle Appearance: Standard pharmacy container. Clearly labeled. Filled Date: 07 / 14 / 2018 Last Medication intake:  Yesterday

## 2017-05-07 NOTE — Progress Notes (Deleted)
Safety precautions to be maintained throughout the outpatient stay will include: orient to surroundings, keep bed in low position, maintain call bell within reach at all times, provide assistance with transfer out of bed and ambulation.  

## 2017-05-08 ENCOUNTER — Telehealth: Payer: Self-pay | Admitting: *Deleted

## 2017-05-08 NOTE — Telephone Encounter (Signed)
Informed patient that she  needed to give the steroid some time to start working.  States her back is sore.  Instructed to put heat on it today.  States some heaviness in her legs.  Informed patient to be careful when moving around.  Denies fever, progressive weakness or bowel/bladder problems.  Informed patient to call us for any further questions or concerns.

## 2017-05-08 NOTE — Telephone Encounter (Signed)
No answer. Left voicemail for patient to call for any post procedure concerns. 

## 2017-05-08 NOTE — Progress Notes (Signed)
Subjective:  Patient ID: Tami Hopkins, female    DOB: Jan 30, 1961  Age: 56 y.o. MRN: [id number]  CC: Back Pain (lower right)   Service Provided on Last Visit: Evaluation PROCEDURE:Lumbar facet block #1 to L2-3 L3-4 L4-5 L5-S1 on the right side via medial branch under fluoroscopic guidance with moderate sedation  HPI Tami Hopkins presents for her first diagnostic facet block today. The quality characteristic and distribution of her pain are as previously documented. No new changes in lower extremity strength or function or bowel or bladder function are noted at this time.   By history she has had some more intense low back pain and she is describing a pain with standing and extension at the low back which is worse with prolonged standing. She has been taking Ultram and this gives her some mild improvement in her low back pain however with the combination of the scoliosis and the recent fall her pain is been very severe. History Tami Hopkins has a past medical history of Anxiety; Asthma; Bursitis of hip bilateral; Hyperlipidemia; PTSD (post-traumatic stress disorder); and Scoliosis.   She has a past surgical history that includes Cesarean section; Endometrial ablation; Abdominal hysterectomy; Nose surgery; and Cholecystectomy.   Her family history includes Alcohol abuse in her father; Arthritis in her mother; Asthma in her mother; Depression in her mother; Diabetes in her sister; Heart attack in her father; Hypertension in her sister; Obesity in her sister.She reports that she has been smoking Cigarettes.  She started smoking about 21 years ago. She has been smoking about 0.25 packs per day. She has never used smokeless tobacco. She reports that she does not drink alcohol or use drugs.  No results found for this or any previous visit.  No results found for: TOXASSSELUR  Outpatient Medications Prior to Visit  Medication Sig Dispense Refill  . baclofen (LIORESAL) 10 MG tablet Take 1 tablet  (10 mg total) by mouth at bedtime. 30 each 1  . clonazePAM (KLONOPIN) 0.5 MG tablet Take 0.25 mg by mouth 2 (two) times daily as needed for anxiety.    . diphenoxylate-atropine (LOMOTIL) 2.5-0.025 MG per tablet Take 1 tablet by mouth 4 (four) times daily as needed for diarrhea or loose stools.    Marland Kitchen FLOVENT HFA 110 MCG/ACT inhaler Inhale 2 puffs into the lungs at bedtime.     . fluticasone (FLONASE) 50 MCG/ACT nasal spray Place 1 spray into both nostrils 2 (two) times daily.    . furosemide (LASIX) 40 MG tablet Take 1 tablet by mouth as needed.     Marland Kitchen KLOR-CON 10 10 MEQ tablet Take 10 mEq by mouth as needed.   5  . montelukast (SINGULAIR) 10 MG tablet Take 10 mg by mouth at bedtime.    Marland Kitchen PROAIR HFA 108 (90 BASE) MCG/ACT inhaler Inhale 2 puffs into the lungs every 4 (four) hours as needed.    . promethazine (PHENERGAN) 25 MG tablet Take 50 mg by mouth as needed.     . risperiDONE (RISPERDAL) 1 MG tablet Take 1 tablet (1 mg total) by mouth at bedtime. 30 tablet 2  . traMADol (ULTRAM) 50 MG tablet Take 1 tablet (50 mg total) by mouth 4 (four) times daily. 120 tablet 1  . ZETIA 10 MG tablet Take 10 mg by mouth daily.  5  . zolpidem (AMBIEN) 10 MG tablet Take 10 mg by mouth at bedtime as needed for sleep.    Marland Kitchen HYDROcodone-acetaminophen (NORCO/VICODIN) 5-325 MG tablet Take 1 tablet by mouth every  6 (six) hours as needed for moderate pain or severe pain. (Patient not taking: Reported on 05/07/2017) 40 tablet 0   No facility-administered medications prior to visit.    Lab Results  Component Value Date   WBC 12.0 (H) 05/11/2015   HGB 12.1 05/11/2015   HCT 36.1 05/11/2015   PLT 223 05/11/2015   GLUCOSE 118 (H) 05/14/2015   ALT 148 (H) 05/14/2015   AST 170 (H) 05/14/2015   NA 138 05/14/2015   K 3.7 05/14/2015   CL 105 05/14/2015   CREATININE 0.72 05/14/2015   BUN 11 05/14/2015   CO2 25 05/14/2015   TSH 0.912 05/11/2015     --------------------------------------------------------------------------------------------------------------------- Mm Digital Screening Bilateral  Result Date: 12/24/2016 CLINICAL DATA:  Screening. EXAM: DIGITAL SCREENING BILATERAL MAMMOGRAM WITH CAD COMPARISON:  Previous exam(s). ACR Breast Density Category b: There are scattered areas of fibroglandular density. FINDINGS: There are no findings suspicious for malignancy. Images were processed with CAD. IMPRESSION: No mammographic evidence of malignancy. A result letter of this screening mammogram will be mailed directly to the patient. RECOMMENDATION: Screening mammogram in one year. (Code:SM-B-01Y) BI-RADS CATEGORY  1: Negative. Electronically Signed   By: Lillia Mountain M.D.   On: 12/24/2016 12:47       ---------------------------------------------------------------------------------------------------------------------- Past Medical History:  Diagnosis Date  . Anxiety   . Asthma   . Bursitis of hip bilateral   . Hyperlipidemia   . PTSD (post-traumatic stress disorder)   . Scoliosis     Past Surgical History:  Procedure Laterality Date  . ABDOMINAL HYSTERECTOMY    . CESAREAN SECTION    . CHOLECYSTECTOMY    . ENDOMETRIAL ABLATION     x3  . NOSE SURGERY      Family History  Problem Relation Age of Onset  . Asthma Mother   . Arthritis Mother   . Depression Mother   . Heart attack Father   . Alcohol abuse Father   . Hypertension Sister   . Diabetes Sister   . Obesity Sister   . Heart failure Neg Hx   . Breast cancer Neg Hx     Social History  Substance Use Topics  . Smoking status: Current Every Day Smoker    Packs/day: 0.25    Types: Cigarettes    Start date: 08/18/1995  . Smokeless tobacco: Never Used  . Alcohol use No     Comment: rare    ---------------------------------------------------------------------------------------------------------------------  BP 111/79   Pulse 81   Temp (!) 96.2 F (35.7  C)   Resp 15   Ht 5\' 8"  (1.727 m)   Wt 178 lb (80.7 kg)   SpO2 99%   BMI 27.06 kg/m    BP Readings from Last 3 Encounters:  05/07/17 111/79  03/28/17 108/76  02/05/17 117/76     Wt Readings from Last 3 Encounters:  05/07/17 178 lb (80.7 kg)  03/28/17 178 lb (80.7 kg)  02/05/17 178 lb (80.7 kg)     ----------------------------------------------------------------------------------------------------------------------  ROS Review of Systems  Cardiac: Negative Pulmonary: Occasional wheezing but no shortness of breath at present Psychologic: Anxiety history of panic attacks and PTSD with no suicidal or homicidal ideation or history of drug abuse. No history of alcoholism.   Objective:  BP 111/79   Pulse 81   Temp (!) 96.2 F (35.7 C)   Resp 15   Ht 5\' 8"  (1.727 m)   Wt 178 lb (80.7 kg)   SpO2 99%   BMI 27.06 kg/m   Physical Exam  Patient is a pleasant 56 year old white female with no acute distress. She ambulates reasonably well. She has from seated to standing with slight discomfort in the low back. X line her heart is regular rate and rhythm without murmur Lungs are clear to auscultation GI deferred She continues to have pain with extension at the low back with right greater than left lateral rotation.   Assessment & Plan:   Tami Hopkins was seen today for back pain.  Diagnoses and all orders for this visit:  DDD (degenerative disc disease), lumbar  Facet arthritis of lumbosacral region ([place]) -     LUMBAR FACET(MEDIAL BRANCH NERVE BLOCK) MBNB -     triamcinolone acetonide (KENALOG-40) injection 40 mg; 1 mL (40 mg total) by Other route once. -     sodium chloride flush (NS) 0.9 % injection 10 mL; 10 mLs by Other route once. -     ropivacaine (PF) 2 mg/mL (0.2%) (NAROPIN) injection 10 mL; 10 mLs by Epidural route once. -     midazolam (VERSED) injection 5 mg; Inject 5 mLs (5 mg total) into the vein once. -     lidocaine (PF) (XYLOCAINE) 1 % injection 5 mL; Inject  5 mLs into the skin once. -     lactated ringers infusion 1,000 mL; Inject 1,000 mLs into the vein continuous. -     LUMBAR FACET(MEDIAL BRANCH NERVE BLOCK) MBNB; Future  Musculoskeletal back pain     ----------------------------------------------------------------------------------------------------------------------  Problem List Items Addressed This Visit    None    Visit Diagnoses    DDD (degenerative disc disease), lumbar    -  Primary   Relevant Medications   triamcinolone acetonide (KENALOG-40) injection 40 mg (Completed)   Facet arthritis of lumbosacral region [facility])       Relevant Medications   triamcinolone acetonide (KENALOG-40) injection 40 mg (Completed)   sodium chloride flush (NS) 0.9 % injection 10 mL   ropivacaine (PF) 2 mg/mL (0.2%) (NAROPIN) injection 10 mL (Completed)   midazolam (VERSED) injection 5 mg (Completed)   lidocaine (PF) (XYLOCAINE) 1 % injection 5 mL   lactated ringers infusion 1,000 mL   Other Relevant Orders   LUMBAR FACET(MEDIAL BRANCH NERVE BLOCK) MBNB   Musculoskeletal back pain       Relevant Medications   triamcinolone acetonide (KENALOG-40) injection 40 mg (Completed)      ----------------------------------------------------------------------------------------------------------------------  1. DDD (degenerative disc disease), lumbar   2. Adolescent idiopathic scoliosis of thoracolumbar region  3. Chronic bilateral thoracic back pain 1 her to continue her muscle relaxant and I'm going to allow her Vicodin to be taken 1 tablet up to twice a day for the short-term to get her through this acute exacerbation in her low back pain. She can continue her Ultram at night time and then moved back to her usual Ultram scheduled once her pain is under better control. 4. Facet arthritis of lumbosacral region [facility])  We'll proceed with her first diagnostic facet block for her low lumbar pain today. We've gone over the risks and benefits of the  procedure with her in full detail and all questions answered. We'll have her return to clinic in a month to 2 months for repeat injection as described today. Continue her current medication management at present. We have reviewed the [place] and it is appropriate as well.  5. Musculoskeletal back pain Continue with back stretching strengthening exercises. -   ----------------------------------------------------------------------------------------------------------------------  I am having Tami Hopkins maintain her diphenoxylate-atropine, montelukast, furosemide, fluticasone,  PROAIR HFA, ZETIA, KLOR-CON 10, promethazine, FLOVENT HFA, risperiDONE, zolpidem, clonazePAM, baclofen, HYDROcodone-acetaminophen, and traMADol. We administered triamcinolone acetonide, ropivacaine (PF) 2 mg/mL (0.2%), midazolam, and lactated ringers.   Meds ordered this encounter  Medications  . triamcinolone acetonide (KENALOG-40) injection 40 mg  . sodium chloride flush (NS) 0.9 % injection 10 mL  . ropivacaine (PF) 2 mg/mL (0.2%) (NAROPIN) injection 10 mL  . midazolam (VERSED) injection 5 mg  . lidocaine (PF) (XYLOCAINE) 1 % injection 5 mL  . lactated ringers infusion 1,000 mL   Procedure: Right side L2-3 L3-4 L4-5 L5-S1 medial branch block to the facets #1 under fluoroscopic guidance with moderate sedation   Patient was taken to the fluoroscopy suite and placed in prone position. A total dose of 5 mg of Versed with 0 cc of fentanyl were titrated for moderate sedation. Vital signs were stable throughout the procedure. The  overlying area of skin on the  right side was prepped with Betadine 3 and strict aseptic technique was utilized throughout the procedure. Flouroscopy was used to I identified the areas overlying the aforementioned facets at L2-3 L3-4 L4-5 and L5-S1.   1% lidocaine 1 cc was infiltrated subcutaneously and into the fascia with a 25-gauge needle at each of these sites. I  then advanced a 22-gauge 3-1/2 inch Quinckie needle with the needle tip to lie at the "[facility]" portion of the [employer] dog". There was negative aspiration for heme or CSF and  no paresthesia. I then injected 2 cc of ropivacaine 0.2% mixed with10 mg of triamcinolone at each of the aforementioned sites. These needles were withdrawn.   The patient was convalesced discharged home stable condition for follow-up as mentioned.    Follow-up: Return in about 1 month (around 06/07/2017) for evaluation, procedure.    Molli Barrows, MD 11:07 AM  The [place] practitioner database for opioid medications on this patient has been reviewed by me and my staff   Greater than 50% of the total encounter time was spent in counseling and / or coordination of care.     This dictation was performed utilizing Systems analyst.  Please excuse any unintentional or mistaken typographical errors as a result.

## 2017-05-13 ENCOUNTER — Telehealth: Payer: Self-pay | Admitting: *Deleted

## 2017-05-13 NOTE — Telephone Encounter (Signed)
Spoke with patient after having conversation with Anderson Malta.  What patient is describing is an acute pain that she states is unrelated to the pain that we treated during procedure appt.  Instructed patient that she would need to go through her PCP for evaluation or possibly ED if the pain is so severe that she is unable to tolerate.  Patient verbalizes u/o information.

## 2017-05-13 NOTE — Telephone Encounter (Signed)
Spoke with patient, states that the pain she was being treated for is better, however now she is having a stabbing pain and is requesting additional pain medication.  Will speak with Anderson Malta and determine when we can bring patient in for evaluation.

## 2017-05-17 DIAGNOSIS — K2100 Gastro-esophageal reflux disease with esophagitis, without bleeding: Secondary | ICD-10-CM | POA: Insufficient documentation

## 2017-05-27 ENCOUNTER — Telehealth: Payer: Self-pay | Admitting: Anesthesiology

## 2017-05-27 NOTE — Telephone Encounter (Signed)
Having stabbing pain and sometime aching pain in her back, would like to have MRI, wants to know if Dr. [name] will order this for her. She has appt on 06-13-17

## 2017-05-27 NOTE — Telephone Encounter (Signed)
Called and talked to pt about needs for wanting a MRI. States she is having severe pain in her back and l hip. Pt is ordered to take Tramadol 1 tab qid but states she is taking 2 pills at a time BID. Her PCP would not order a MRI and stated that pain mgtment would need too. Patient would also like something stronger for pain.  Pt has appt on 8/23 and feels like a MRI would help her with that appt.

## 2017-05-29 ENCOUNTER — Other Ambulatory Visit: Payer: Self-pay

## 2017-05-29 ENCOUNTER — Telehealth (HOSPITAL_BASED_OUTPATIENT_CLINIC_OR_DEPARTMENT_OTHER): Payer: Medicare Other

## 2017-05-29 DIAGNOSIS — M545 Low back pain, unspecified: Secondary | ICD-10-CM

## 2017-05-29 NOTE — Telephone Encounter (Signed)
Md OK'd for pt to have MRI- called pt to inform her but Dr [name] did not put an order in

## 2017-05-29 NOTE — Telephone Encounter (Signed)
Dr [name] OK'd pt to have MRI but did not put in order- I called pt to let her know that he said OK. Will need to ask for him to put order in. Thanks

## 2017-06-03 NOTE — Telephone Encounter (Signed)
Note given to Dr [name] to place order for MRI.

## 2017-06-05 NOTE — Addendum Note (Signed)
Addended by: Molli Barrows on: 06/05/2017 10:01 AM   Modules accepted: Orders

## 2017-06-06 ENCOUNTER — Other Ambulatory Visit: Payer: Self-pay | Admitting: *Deleted

## 2017-06-06 ENCOUNTER — Telehealth: Payer: Self-pay | Admitting: *Deleted

## 2017-06-06 DIAGNOSIS — M47817 Spondylosis without myelopathy or radiculopathy, lumbosacral region: Secondary | ICD-10-CM

## 2017-06-06 DIAGNOSIS — M5136 Other intervertebral disc degeneration, lumbar region: Secondary | ICD-10-CM

## 2017-06-06 NOTE — Telephone Encounter (Signed)
Spoke with Juliann Pulse to let her know that order has been placed for MRI.  She will see if PA needs to be obtained and get patient scheduled.

## 2017-06-06 NOTE — Telephone Encounter (Signed)
Order entered as requested

## 2017-06-13 ENCOUNTER — Ambulatory Visit: Payer: Medicare Other | Attending: Anesthesiology | Admitting: Anesthesiology

## 2017-06-13 ENCOUNTER — Encounter: Payer: Self-pay | Admitting: Anesthesiology

## 2017-06-13 VITALS — BP 117/86 | HR 104 | Temp 98.4°F | Resp 18 | Ht 68.0 in | Wt 171.0 lb

## 2017-06-13 DIAGNOSIS — M419 Scoliosis, unspecified: Secondary | ICD-10-CM | POA: Insufficient documentation

## 2017-06-13 DIAGNOSIS — F419 Anxiety disorder, unspecified: Secondary | ICD-10-CM | POA: Insufficient documentation

## 2017-06-13 DIAGNOSIS — F17210 Nicotine dependence, cigarettes, uncomplicated: Secondary | ICD-10-CM | POA: Insufficient documentation

## 2017-06-13 DIAGNOSIS — Z818 Family history of other mental and behavioral disorders: Secondary | ICD-10-CM | POA: Diagnosis not present

## 2017-06-13 DIAGNOSIS — Z7951 Long term (current) use of inhaled steroids: Secondary | ICD-10-CM | POA: Insufficient documentation

## 2017-06-13 DIAGNOSIS — M549 Dorsalgia, unspecified: Secondary | ICD-10-CM

## 2017-06-13 DIAGNOSIS — F4310 Post-traumatic stress disorder, unspecified: Secondary | ICD-10-CM | POA: Diagnosis not present

## 2017-06-13 DIAGNOSIS — M545 Low back pain, unspecified: Secondary | ICD-10-CM

## 2017-06-13 DIAGNOSIS — Z833 Family history of diabetes mellitus: Secondary | ICD-10-CM | POA: Diagnosis not present

## 2017-06-13 DIAGNOSIS — Z8261 Family history of arthritis: Secondary | ICD-10-CM | POA: Diagnosis not present

## 2017-06-13 DIAGNOSIS — Z825 Family history of asthma and other chronic lower respiratory diseases: Secondary | ICD-10-CM | POA: Insufficient documentation

## 2017-06-13 DIAGNOSIS — M47817 Spondylosis without myelopathy or radiculopathy, lumbosacral region: Secondary | ICD-10-CM | POA: Insufficient documentation

## 2017-06-13 DIAGNOSIS — Z811 Family history of alcohol abuse and dependence: Secondary | ICD-10-CM | POA: Diagnosis not present

## 2017-06-13 DIAGNOSIS — Z90710 Acquired absence of both cervix and uterus: Secondary | ICD-10-CM | POA: Diagnosis not present

## 2017-06-13 DIAGNOSIS — Z9049 Acquired absence of other specified parts of digestive tract: Secondary | ICD-10-CM | POA: Diagnosis not present

## 2017-06-13 DIAGNOSIS — M4697 Unspecified inflammatory spondylopathy, lumbosacral region: Secondary | ICD-10-CM | POA: Diagnosis not present

## 2017-06-13 DIAGNOSIS — G8929 Other chronic pain: Secondary | ICD-10-CM | POA: Diagnosis not present

## 2017-06-13 DIAGNOSIS — J45909 Unspecified asthma, uncomplicated: Secondary | ICD-10-CM | POA: Diagnosis not present

## 2017-06-13 DIAGNOSIS — E785 Hyperlipidemia, unspecified: Secondary | ICD-10-CM | POA: Diagnosis not present

## 2017-06-13 DIAGNOSIS — M5136 Other intervertebral disc degeneration, lumbar region: Secondary | ICD-10-CM | POA: Diagnosis not present

## 2017-06-13 DIAGNOSIS — M41125 Adolescent idiopathic scoliosis, thoracolumbar region: Secondary | ICD-10-CM | POA: Diagnosis not present

## 2017-06-13 DIAGNOSIS — Z8249 Family history of ischemic heart disease and other diseases of the circulatory system: Secondary | ICD-10-CM | POA: Insufficient documentation

## 2017-06-13 DIAGNOSIS — R1030 Lower abdominal pain, unspecified: Secondary | ICD-10-CM | POA: Diagnosis not present

## 2017-06-13 DIAGNOSIS — Z79899 Other long term (current) drug therapy: Secondary | ICD-10-CM | POA: Insufficient documentation

## 2017-06-13 MED ORDER — TRAMADOL HCL 50 MG PO TABS: 50.0000 mg | ORAL_TABLET | Freq: Three times a day (TID) | ORAL | 1 refills | Status: DC

## 2017-06-13 NOTE — Patient Instructions (Signed)
Facet Blocks Patient Information  Description: The facets are joints in the spine between the vertebrae.  Like any joints in the body, facets can become irritated and painful.  Arthritis can also effect the facets.  By injecting steroids and local anesthetic in and around these joints, we can temporarily block the nerve supply to them.  Steroids act directly on irritated nerves and tissues to reduce selling and inflammation which often leads to decreased pain.  Facet blocks may be done anywhere along the spine from the neck to the low back depending upon the location of your pain.   After numbing the skin with local anesthetic (like Novocaine), a small needle is passed onto the facet joints under x-ray guidance.  You may experience a sensation of pressure while this is being done.  The entire block usually lasts about 15-25 minutes.   Conditions which may be treated by facet blocks:   Low back/buttock pain  Neck/shoulder pain  Certain types of headaches  Preparation for the injection:  1. Do not eat any solid food or dairy products within 8 hours of your appointment. 2. You may drink clear liquid up to 3 hours before appointment.  Clear liquids include water, black coffee, juice or soda.  No milk or cream please. 3. You may take your regular medication, including pain medications, with a sip of water before your appointment.  Diabetics should hold regular insulin (if taken separately) and take 1/2 normal NPH dose the morning of the procedure.  Carry some sugar containing items with you to your appointment. 4. A driver must accompany you and be prepared to drive you home after your procedure. 5. Bring all your current medications with you. 6. An IV may be inserted and sedation may be given at the discretion of the physician. 7. A blood pressure cuff, EKG and other monitors will often be applied during the procedure.  Some patients may need to have extra oxygen administered for a short  period. 8. You will be asked to provide medical information, including your allergies and medications, prior to the procedure.  We must know immediately if you are taking blood thinners (like Coumadin/Warfarin) or if you are allergic to IV iodine contrast (dye).  We must know if you could possible be pregnant.  Possible side-effects:   Bleeding from needle site  Infection (rare, may require surgery)  Nerve injury (rare)  Numbness & tingling (temporary)  Difficulty urinating (rare, temporary)  Spinal headache (a headache worse with upright posture)  Light-headedness (temporary)  Pain at injection site (serveral days)  Decreased blood pressure (rare, temporary)  Weakness in arm/leg (temporary)  Pressure sensation in back/neck (temporary)   Call if you experience:   Fever/chills associated with headache or increased back/neck pain  Headache worsened by an upright position  New onset, weakness or numbness of an extremity below the injection site  Hives or difficulty breathing (go to the emergency room)  Inflammation or drainage at the injection site(s)  Severe back/neck pain greater than usual  New symptoms which are concerning to you  Please note:  Although the local anesthetic injected can often make your back or neck feel good for several hours after the injection, the pain will likely return. It takes 3-7 days for steroids to work.  You may not notice any pain relief for at least one week.  If effective, we will often do a series of 2-3 injections spaced 3-6 weeks apart to maximally decrease your pain.  After the initial   series, you may be a candidate for a more permanent nerve block of the facets.  If you have any questions, please call [phone] [facility] 

## 2017-06-13 NOTE — Progress Notes (Signed)
Nursing Pain Medication Assessment:  Safety precautions to be maintained throughout the outpatient stay will include: orient to surroundings, keep bed in low position, maintain call bell within reach at all times, provide assistance with transfer out of bed and ambulation.  Medication Inspection Compliance: Pill count conducted under aseptic conditions, in front of the patient. Neither the pills nor the bottle was removed from the patient's sight at any time. Once count was completed pills were immediately returned to the patient in their original bottle.  Medication: Tramadol (Ultram) Pill/Patch Count: 2 of 120 pills remain Pill/Patch Appearance: Markings consistent with prescribed medication Bottle Appearance: Standard pharmacy container. Clearly labeled. Filled Date: 07 / 14 / 2018 Last Medication intake:  Today

## 2017-06-13 NOTE — Progress Notes (Signed)
Subjective:  Patient ID: Tami Hopkins, female    DOB: [birth year]  Age: 56 y.o. MRN: [id number]  CC: Back Pain (low) and Groin Pain (left)   Service Provided on Last Visit: Procedure (right facet block) PROCEDURE:None  Previous procedure Lumbar facet block #1 to L2-3 L3-4 L4-5 L5-S1 on the right side via medial branch under fluoroscopic guidance with moderate sedation  HPI Tami Hopkins presents for reevaluation today. She was last seen a couple months ago and had a lumbar facet block at that time. She maintains that she initially had minimal relief over the course of the first 3 days however had a 50% reduction in her pain over the next week and now her pain in the right lower back and leg is approximately 70% better. She does not have a driver to assist with her procedure as scheduled today. She is taking tramadol generally 1 or 2 tablets 3 times a day. No more than 4 tablets per day. The quality characteristic addition patient of her lower extremity pain is otherwise unchanged. Her strength is been stable with no changes in bowel or bladder function. The pain is described as a gnawing aching burning pain in the low back with radiation primarily into the right posterior and lateral leg.   By history she has had some more intense low back pain and she is describing a pain with standing and extension at the low back which is worse with prolonged standing. She has been taking Ultram and this gives her some mild improvement in her low back pain however with the combination of the scoliosis and the recent fall her pain is been very severe. History Tami Hopkins has a past medical history of Anxiety; Asthma; Bursitis of hip bilateral; Hyperlipidemia; PTSD (post-traumatic stress disorder); and Scoliosis.   She has a past surgical history that includes Cesarean section; Endometrial ablation; Abdominal hysterectomy; Nose surgery; and Cholecystectomy.   Her family history includes Alcohol abuse in her  father; Arthritis in her mother; Asthma in her mother; Depression in her mother; Diabetes in her sister; Heart attack in her father; Hypertension in her sister; Obesity in her sister.She reports that she has been smoking Cigarettes.  She started smoking about 21 years ago. She has been smoking about 0.25 packs per day. She has never used smokeless tobacco. She reports that she does not drink alcohol or use drugs.  No results found for this or any previous visit.  No results found for: TOXASSSELUR  Outpatient Medications Prior to Visit  Medication Sig Dispense Refill  . baclofen (LIORESAL) 10 MG tablet Take 1 tablet (10 mg total) by mouth at bedtime. 30 each 1  . clonazePAM (KLONOPIN) 0.5 MG tablet Take 0.25 mg by mouth 2 (two) times daily as needed for anxiety.    . diphenoxylate-atropine (LOMOTIL) 2.5-0.025 MG per tablet Take 1 tablet by mouth 4 (four) times daily as needed for diarrhea or loose stools.    Marland Kitchen FLOVENT HFA 110 MCG/ACT inhaler Inhale 2 puffs into the lungs at bedtime.     . fluticasone (FLONASE) 50 MCG/ACT nasal spray Place 1 spray into both nostrils 2 (two) times daily.    . furosemide (LASIX) 40 MG tablet Take 1 tablet by mouth as needed.     Marland Kitchen HYDROcodone-acetaminophen (NORCO/VICODIN) 5-325 MG tablet Take 1 tablet by mouth every 6 (six) hours as needed for moderate pain or severe pain. 40 tablet 0  . KLOR-CON 10 10 MEQ tablet Take 10 mEq by mouth as needed.  5  . montelukast (SINGULAIR) 10 MG tablet Take 10 mg by mouth at bedtime.    Marland Kitchen PROAIR HFA 108 (90 BASE) MCG/ACT inhaler Inhale 2 puffs into the lungs every 4 (four) hours as needed.    . promethazine (PHENERGAN) 25 MG tablet Take 50 mg by mouth as needed.     . risperiDONE (RISPERDAL) 1 MG tablet Take 1 tablet (1 mg total) by mouth at bedtime. 30 tablet 2  . ZETIA 10 MG tablet Take 10 mg by mouth daily.  5  . zolpidem (AMBIEN) 10 MG tablet Take 10 mg by mouth at bedtime as needed for sleep.    . traMADol (ULTRAM) 50 MG  tablet Take 1 tablet (50 mg total) by mouth 4 (four) times daily. 120 tablet 1   No facility-administered medications prior to visit.    Lab Results  Component Value Date   WBC 12.0 (H) 05/11/2015   HGB 12.1 05/11/2015   HCT 36.1 05/11/2015   PLT 223 05/11/2015   GLUCOSE 118 (H) 05/14/2015   ALT 148 (H) 05/14/2015   AST 170 (H) 05/14/2015   NA 138 05/14/2015   K 3.7 05/14/2015   CL 105 05/14/2015   CREATININE 0.72 05/14/2015   BUN 11 05/14/2015   CO2 25 05/14/2015   TSH 0.912 05/11/2015    --------------------------------------------------------------------------------------------------------------------- Mm Digital Screening Bilateral  Result Date: 12/24/2016 CLINICAL DATA:  Screening. EXAM: DIGITAL SCREENING BILATERAL MAMMOGRAM WITH CAD COMPARISON:  Previous exam(s). ACR Breast Density Category b: There are scattered areas of fibroglandular density. FINDINGS: There are no findings suspicious for malignancy. Images were processed with CAD. IMPRESSION: No mammographic evidence of malignancy. A result letter of this screening mammogram will be mailed directly to the patient. RECOMMENDATION: Screening mammogram in one year. (Code:SM-B-01Y) BI-RADS CATEGORY  1: Negative. Electronically Signed   By: Lillia Mountain M.D.   On: 12/24/2016 12:47       ---------------------------------------------------------------------------------------------------------------------- Past Medical History:  Diagnosis Date  . Anxiety   . Asthma   . Bursitis of hip bilateral   . Hyperlipidemia   . PTSD (post-traumatic stress disorder)   . Scoliosis     Past Surgical History:  Procedure Laterality Date  . ABDOMINAL HYSTERECTOMY    . CESAREAN SECTION    . CHOLECYSTECTOMY    . ENDOMETRIAL ABLATION     x3  . NOSE SURGERY      Family History  Problem Relation Age of Onset  . Asthma Mother   . Arthritis Mother   . Depression Mother   . Heart attack Father   . Alcohol abuse Father   .  Hypertension Sister   . Diabetes Sister   . Obesity Sister   . Heart failure Neg Hx   . Breast cancer Neg Hx     Social History  Substance Use Topics  . Smoking status: Current Every Day Smoker    Packs/day: 0.25    Types: Cigarettes    Start date: 08/18/1995  . Smokeless tobacco: Never Used  . Alcohol use No     Comment: rare    ---------------------------------------------------------------------------------------------------------------------  BP 117/86 (BP Location: Left Arm, Patient Position: Sitting, Cuff Size: Normal)   Pulse (!) 104   Temp 98.4 F (36.9 C) (Oral)   Resp 18   Ht 5\' 8"  (1.727 m)   Wt 171 lb (77.6 kg)   SpO2 97%   BMI 26.00 kg/m    BP Readings from Last 3 Encounters:  06/13/17 117/86  05/07/17 111/79  03/28/17  108/76     Wt Readings from Last 3 Encounters:  06/13/17 171 lb (77.6 kg)  05/07/17 178 lb (80.7 kg)  03/28/17 178 lb (80.7 kg)     ----------------------------------------------------------------------------------------------------------------------  ROS Review of Systems  Cardiac: Negative Pulmonary: No shortness of breath Psychologic: Anxiety history of panic attacks and PTSD with no suicidal or homicidal ideation or history of drug abuse. No history of alcoholism. GI: No constipation or diarrhea   Objective:  BP 117/86 (BP Location: Left Arm, Patient Position: Sitting, Cuff Size: Normal)   Pulse (!) 104   Temp 98.4 F (36.9 C) (Oral)   Resp 18   Ht 5\' 8"  (1.727 m)   Wt 171 lb (77.6 kg)   SpO2 97%   BMI 26.00 kg/m   Physical Exam Patient is a pleasant 56 year old white female with no acute distress. She ambulates reasonably well. She has from seated to standing with slight discomfort in the low back. X line her heart is regular rate and rhythm without murmur Lungs are clear to auscultation GI deferred Musculoskeletal: She has some right side lumbar paraspinous muscle tenderness with pain on extension and right  lateral rotation but no weakness affecting the lower extremities is noted. Her muscle tone and bulk is good.  Assessment & Plan:   Tami Hopkins was seen today for back pain and groin pain.  Diagnoses and all orders for this visit:  Back pain at L4-L5 level  DDD (degenerative disc disease), lumbar  Facet arthritis of lumbosacral region [facility])  Musculoskeletal back pain  Other orders -     traMADol (ULTRAM) 50 MG tablet; Take 1 tablet (50 mg total) by mouth 3 (three) times daily.     ----------------------------------------------------------------------------------------------------------------------  Problem List Items Addressed This Visit    None    Visit Diagnoses    Back pain at L4-L5 level    -  Primary   Relevant Medications   traMADol (ULTRAM) 50 MG tablet   DDD (degenerative disc disease), lumbar       Relevant Medications   traMADol (ULTRAM) 50 MG tablet   Facet arthritis of lumbosacral region (HCC)       Relevant Medications   traMADol (ULTRAM) 50 MG tablet   Musculoskeletal back pain       Relevant Medications   traMADol (ULTRAM) 50 MG tablet      ----------------------------------------------------------------------------------------------------------------------  1. DDD (degenerative disc disease), lumbar   2. Adolescent idiopathic scoliosis of thoracolumbar region  3. Chronic bilateral thoracic back pain At present she is only taking Ultram and I'm going to increase her dose to 2 tablets 3 times a day with a refill given for 180 tablets. We talked about the risks and benefits of the medication. I want her to continue with back stretching strengthening exercises and core strengthening activities as reviewed with her today. 4. Facet arthritis of lumbosacral region [facility]) We will reschedule her right side therapeutic lumbar facet block for 1 month and we've gone over the risks and benefits of the procedure once again in detail.  5. Musculoskeletal back  pain Continue with back stretching strengthening exercises. -   ----------------------------------------------------------------------------------------------------------------------  I have changed Ms. Panchal's traMADol. I am also having her maintain her diphenoxylate-atropine, montelukast, furosemide, fluticasone, PROAIR HFA, ZETIA, KLOR-CON 10, promethazine, FLOVENT HFA, risperiDONE, zolpidem, clonazePAM, baclofen, and HYDROcodone-acetaminophen.   Meds ordered this encounter  Medications  . traMADol (ULTRAM) 50 MG tablet    Sig: Take 1 tablet (50 mg total) by mouth 3 (three) times daily.  Dispense:  180 tablet    Refill:  1    Follow-up: Return in about 1 month (around 07/14/2017) for evaluation, procedure.    Molli Barrows, MD 1:41 PM   Greater than 50% of the total encounter time was spent in counseling and / or coordination of care.     This dictation was performed utilizing Systems analyst.  Please excuse any unintentional or mistaken typographical errors as a result.

## 2017-06-14 ENCOUNTER — Telehealth: Payer: Self-pay

## 2017-06-18 ENCOUNTER — Ambulatory Visit
Admission: RE | Admit: 2017-06-18 | Discharge: 2017-06-18 | Disposition: A | Discharge status: Home / Self Care | Payer: Medicare Other | Source: Ambulatory Visit | Attending: Anesthesiology | Admitting: Anesthesiology

## 2017-06-18 DIAGNOSIS — M47817 Spondylosis without myelopathy or radiculopathy, lumbosacral region: Secondary | ICD-10-CM

## 2017-06-18 DIAGNOSIS — M5136 Other intervertebral disc degeneration, lumbar region: Secondary | ICD-10-CM | POA: Insufficient documentation

## 2017-06-18 DIAGNOSIS — M4186 Other forms of scoliosis, lumbar region: Secondary | ICD-10-CM | POA: Insufficient documentation

## 2017-06-18 DIAGNOSIS — M51369 Other intervertebral disc degeneration, lumbar region without mention of lumbar back pain or lower extremity pain: Secondary | ICD-10-CM

## 2017-06-18 NOTE — Telephone Encounter (Signed)
Pt was seen on  01-14-17. Close note

## 2017-07-15 ENCOUNTER — Telehealth: Payer: Self-pay | Admitting: Anesthesiology

## 2017-07-15 NOTE — Telephone Encounter (Signed)
Spoke with patient to clarify Rx and what she was needing.  Sig was not correct on prescription.  Called to [facility] to clarify Sig; tramadol 50 mg 2 tablets tid, qty 180.

## 2017-07-15 NOTE — Telephone Encounter (Signed)
Patient called stating she tried to call for her tramadol and pharmacy told her the last script picked up 06-13-17 was 120 and it was a 2 month supply. Last time she was here she got script for 180 per month due to increased dosage. [facility] [place]. She also says directions are incorrect. Please call asap to discuss meds

## 2017-07-23 ENCOUNTER — Ambulatory Visit: Payer: Medicare Other | Admitting: Anesthesiology

## 2017-08-15 ENCOUNTER — Other Ambulatory Visit: Payer: Self-pay | Admitting: Anesthesiology

## 2017-08-22 ENCOUNTER — Other Ambulatory Visit: Payer: Self-pay | Admitting: Anesthesiology

## 2017-08-22 ENCOUNTER — Encounter: Payer: Self-pay | Admitting: Anesthesiology

## 2017-08-22 ENCOUNTER — Ambulatory Visit (HOSPITAL_BASED_OUTPATIENT_CLINIC_OR_DEPARTMENT_OTHER): Payer: Medicare Other | Admitting: Anesthesiology

## 2017-08-22 ENCOUNTER — Ambulatory Visit
Admission: RE | Admit: 2017-08-22 | Discharge: 2017-08-22 | Disposition: A | Discharge status: Home / Self Care | Payer: Medicare Other | Source: Ambulatory Visit | Attending: Anesthesiology | Admitting: Anesthesiology

## 2017-08-22 VITALS — BP 109/81 | HR 95 | Temp 97.0°F | Resp 13 | Ht 68.0 in | Wt 169.0 lb

## 2017-08-22 DIAGNOSIS — M545 Low back pain, unspecified: Secondary | ICD-10-CM

## 2017-08-22 DIAGNOSIS — Z79891 Long term (current) use of opiate analgesic: Secondary | ICD-10-CM | POA: Insufficient documentation

## 2017-08-22 DIAGNOSIS — M41125 Adolescent idiopathic scoliosis, thoracolumbar region: Secondary | ICD-10-CM | POA: Insufficient documentation

## 2017-08-22 DIAGNOSIS — M47817 Spondylosis without myelopathy or radiculopathy, lumbosacral region: Secondary | ICD-10-CM | POA: Diagnosis not present

## 2017-08-22 DIAGNOSIS — R52 Pain, unspecified: Secondary | ICD-10-CM

## 2017-08-22 DIAGNOSIS — Z79899 Other long term (current) drug therapy: Secondary | ICD-10-CM | POA: Diagnosis not present

## 2017-08-22 DIAGNOSIS — M5136 Other intervertebral disc degeneration, lumbar region: Secondary | ICD-10-CM

## 2017-08-22 DIAGNOSIS — M549 Dorsalgia, unspecified: Secondary | ICD-10-CM

## 2017-08-22 DIAGNOSIS — M546 Pain in thoracic spine: Secondary | ICD-10-CM

## 2017-08-22 DIAGNOSIS — G8929 Other chronic pain: Secondary | ICD-10-CM

## 2017-08-22 MED ORDER — LACTATED RINGERS IV SOLN
1000.0000 mL | INTRAVENOUS | Status: DC
  Administered 2017-08-22: 1000 mL via INTRAVENOUS

## 2017-08-22 MED ORDER — TRAMADOL HCL 50 MG PO TABS: 50.0000 mg | ORAL_TABLET | Freq: Three times a day (TID) | ORAL | 2 refills | Status: DC

## 2017-08-22 MED ORDER — TRAMADOL HCL 50 MG PO TABS: 100.0000 mg | ORAL_TABLET | Freq: Three times a day (TID) | ORAL | 2 refills | Status: DC

## 2017-08-22 MED ORDER — MIDAZOLAM HCL 5 MG/5ML IJ SOLN
5.0000 mg | Freq: Once | INTRAMUSCULAR | Status: AC
  Administered 2017-08-22: 5 mg via INTRAVENOUS
  Filled 2017-08-22: qty 5

## 2017-08-22 MED ORDER — ROPIVACAINE HCL 2 MG/ML IJ SOLN
20.0000 mL | Freq: Once | INTRAMUSCULAR | Status: AC
  Administered 2017-08-22: 10 mL via EPIDURAL
  Filled 2017-08-22: qty 20

## 2017-08-22 MED ORDER — SODIUM CHLORIDE 0.9% FLUSH: 10.0000 mL | Freq: Once | INTRAVENOUS | Status: DC

## 2017-08-22 MED ORDER — LIDOCAINE HCL (PF) 1 % IJ SOLN
5.0000 mL | Freq: Once | INTRAMUSCULAR | Status: AC
  Administered 2017-08-22: 5 mL via SUBCUTANEOUS
  Filled 2017-08-22: qty 5

## 2017-08-22 MED ORDER — TRIAMCINOLONE ACETONIDE 40 MG/ML IJ SUSP
80.0000 mg | Freq: Once | INTRAMUSCULAR | Status: AC
  Administered 2017-08-22: 40 mg
  Filled 2017-08-22: qty 2

## 2017-08-22 NOTE — Patient Instructions (Addendum)
You have been given a script today for Tramadol with 2 refills   Post-procedure Information What to expect: Most procedures involve the use of a local anesthetic (numbing medicine), and a steroid (anti-inflammatory medicine).  The local anesthetics may cause temporary numbness and weakness of the legs or arms, depending on the location of the block. This numbness/weakness may last 4-6 hours, depending on the local anesthetic used. In rare instances, it can last up to 24 hours. While numb, you must be very careful not to injure the extremity.  After any procedure, you could expect the pain to get better within 15-20 minutes. This relief is temporary and may last 4-6 hours. Once the local anesthetics wears off, you could experience discomfort, possibly more than usual, for up to 10 (ten) days. In the case of radiofrequencies, it may last up to 6 weeks. Surgeries may take up to 8 weeks for the healing process. The discomfort is due to the irritation caused by needles going through skin and muscle. To minimize the discomfort, we recommend using ice the first day, and heat from then on. The ice should be applied for 15 minutes on, and 15 minutes off. Keep repeating this cycle until bedtime. Avoid applying the ice directly to the skin, to prevent frostbite. Heat should be used daily, until the pain improves (4-10 days). Be careful not to burn yourself.  Occasionally you may experience muscle spasms or cramps. These occur as a consequence of the irritation caused by the needle sticks to the muscle and the blood that will inevitably be lost into the surrounding muscle tissue. Blood tends to be very irritating to tissues, which tend to react by going into spasm. These spasms may start the same day of your procedure, but they may also take days to develop. This late onset type of spasm or cramp is usually caused by electrolyte imbalances triggered by the steroids, at the level of the kidney. Cramps and spasms tend  to respond well to muscle relaxants, multivitamins (some are triggered by the procedure, but may have their origins in vitamin deficiencies), and "Gatorade", or any sports drinks that can replenish any electrolyte imbalances. (If you are a diabetic, ask your pharmacist to get you a sugar-free brand.) Warm showers or baths may also be helpful. Stretching exercises are highly recommended. General Instructions:  Be alert for signs of possible infection: redness, swelling, heat, red streaks, elevated temperature, and/or fever. These typically appear 4 to 6 days after the procedure. Immediately notify your doctor if you experience unusual bleeding, difficulty breathing, or loss of bowel or bladder control. If you experience increased pain, do not increase your pain medicine intake, unless instructed by your pain physician. Post-Procedure Care:  Be careful in moving about. Muscle spasms in the area of the injection may occur. Applying ice or heat to the area is often helpful. The incidence of spinal headaches after epidural injections ranges between 1.4% and 6%. If you develop a headache that does not seem to respond to conservative therapy, please let your physician know. This can be treated with an epidural blood patch.   Post-procedure numbness or redness is to be expected, however it should average 4 to 6 hours. If numbness and weakness of your extremities begins to develop 4 to 6 hours after your procedure, and is felt to be progressing and worsening, immediately contact your physician.   Diet:  If you experience nausea, do not eat until this sensation goes away. If you had a "Stellate Ganglion  Block" for upper extremity "Reflex Sympathetic Dystrophy", do not eat or drink until your hoarseness goes away. In any case, always start with liquids first and if you tolerate them well, then slowly progress to more solid foods. Activity:  For the first 4 to 6 hours after the procedure, use caution in moving about  as you may experience numbness and/or weakness. Use caution in cooking, using household electrical appliances, and climbing steps. If you need to reach your Doctor call our office: [phone]) [phone] Monday-Thursday 8:00 am - 4:00 PM    Fridays: Closed     In case of an emergency: In case of emergency, call 911 or go to the nearest emergency room and have the physician there call us.  Interpretation of Procedure Every nerve block has two components: a diagnostic component, and a treatment component. Unrealistic expectations are the most common causes of "perceived failure".  In a perfect world, a single nerve block should be able to completely and permanently eliminate the pain. Sadly, the world is not perfect.  Most pain management nerve blocks are performed using local anesthetics and steroids. Steroids are responsible for any long-term benefit that you may experience. Their purpose is to decrease any chronic swelling that may exist in the area. Steroids begin to work immediately after being injected. However, most patients will not experience any benefits until 5 to 10 days after the injection, when the swelling has come down to the point where they can tell a difference. Steroids will only help if there is swelling to be treated. As such, they can assist with the diagnosis. If effective, they suggest an inflammatory component to the pain, and if ineffective, they rule out inflammation as the main cause or component of the problem. If the problem is one of mechanical compression, you will get no benefit from those steroids.   In the case of local anesthetics, they have a crucial role in the diagnosis of your condition. Most will begin to work within15 to 20 minutes after injection. The duration will depend on the type used (short- vs. Long-acting). It is of outmost importance that patients keep tract of their pain, after the procedure. To assist with this matter, a "Post-procedure Pain Diary" is provided.  Make sure to complete it and to bring it back to your follow-up appointment.  As long as the patient keeps accurate, detailed records of their symptoms after every procedure, and returns to have those interpreted, every procedure will provide [place] with invaluable information. Even a block that does not provide the patient with any relief, will always provide [place] with information about the mechanism and the origin of the pain. The only time a nerve block can be considered a waste of time is when patients do not keep track of the results, or do not keep their post-procedure appointment.  Reporting the results back to your physician The Pain Score  Pain is a subjective complaint. It cannot be seen, touched, or measured. We depend entirely on the patient's report of the pain in order to assess your condition and treatment. To evaluate the pain, we use a pain scale, where "0" means "No Pain", and a "10" is "the worst possible pain that you can even imagine" (i.e. something like been eaten alive by a shark or being torn apart by a lion).   You will frequently be asked to rate your pain. Please be as accurate, remember that medical decisions will be based on your responses. Please do not rate your  pain above a 10. Doing so is actually interpreted as "symptom magnification" (exaggeration), as well as lack of understanding with regards to the scale. To put this into perspective, when you tell us that your pain is at a 10 (ten), what you are saying is that there is nothing we can do to make this pain any worse. (Carefully think about that.)

## 2017-08-23 ENCOUNTER — Telehealth: Payer: Self-pay

## 2017-08-23 NOTE — Progress Notes (Signed)
Subjective:  Patient ID: Tami Hopkins, female    DOB: 04/21/1961  Age: 56 y.o. MRN: [id number]  CC: Back Pain (mid to lower)   Procedure: Right side medial branch block to the facets under fluoroscopic guidance and moderate sedation  HPI Tami Hopkins presents for reevaluation.  She was last seen back in August and prior to that had a July bilateral facet block.  This gave her good relief of her low back pain lasting several months at a 50-75% improvement.  She reports today for reevaluation and possible repeat injection.  Most of her pain is on the right side and she has diminished pain on the left side.  She would like to pursue a repeat right side facet block today.  No change in lower extremity strength or function is noted.  She is otherwise been doing well and trying to do her exercises as tolerated.  She is still taking Ultram generally 2 tablets up to 3 times a day and baclofen for muscle relaxation.  These are well-tolerated based on her narcotic assessment sheet and she derives good functional lifestyle improvement with her medications.  Otherwise she is in her usual state of health at this time.  Outpatient Medications Prior to Visit  Medication Sig Dispense Refill  . baclofen (LIORESAL) 10 MG tablet Take 1 tablet (10 mg total) by mouth at bedtime. 30 each 1  . clonazePAM (KLONOPIN) 0.5 MG tablet Take 0.25 mg by mouth 2 (two) times daily as needed for anxiety.    . diphenoxylate-atropine (LOMOTIL) 2.5-0.025 MG per tablet Take 1 tablet by mouth 4 (four) times daily as needed for diarrhea or loose stools.    Marland Kitchen FLOVENT HFA 110 MCG/ACT inhaler Inhale 2 puffs into the lungs at bedtime.     . fluticasone (FLONASE) 50 MCG/ACT nasal spray Place 1 spray into both nostrils 2 (two) times daily.    . furosemide (LASIX) 40 MG tablet Take 1 tablet by mouth as needed.     Marland Kitchen KLOR-CON 10 10 MEQ tablet Take 10 mEq by mouth as needed.   5  . montelukast (SINGULAIR) 10 MG tablet Take 10 mg by  mouth at bedtime.    Marland Kitchen PROAIR HFA 108 (90 BASE) MCG/ACT inhaler Inhale 2 puffs into the lungs every 4 (four) hours as needed.    . promethazine (PHENERGAN) 25 MG tablet Take 50 mg by mouth as needed.     . risperiDONE (RISPERDAL) 1 MG tablet Take 1 tablet (1 mg total) by mouth at bedtime. 30 tablet 2  . ZETIA 10 MG tablet Take 10 mg by mouth daily.  5  . zolpidem (AMBIEN) 10 MG tablet Take 10 mg by mouth at bedtime as needed for sleep.    . traMADol (ULTRAM) 50 MG tablet Take 1 tablet (50 mg total) by mouth 3 (three) times daily. 180 tablet 1  . HYDROcodone-acetaminophen (NORCO/VICODIN) 5-325 MG tablet Take 1 tablet by mouth every 6 (six) hours as needed for moderate pain or severe pain. (Patient not taking: Reported on 08/22/2017) 40 tablet 0   No facility-administered medications prior to visit.     Review of Systems CNS: No sedation or confusion Cardiac: No angina or palpitations GI: No constipation or abdominal pain  Objective:  BP 109/81   Pulse 95   Temp (!) 97 F (36.1 C)   Resp 13   Ht 5\' 8"  (1.727 m)   Wt 169 lb (76.7 kg)   SpO2 98%   BMI 25.70 kg/m  BP Readings from Last 3 Encounters:  08/22/17 109/81  06/13/17 117/86  05/07/17 111/79     Wt Readings from Last 3 Encounters:  08/22/17 169 lb (76.7 kg)  06/13/17 171 lb (77.6 kg)  05/07/17 178 lb (80.7 kg)     Physical Exam Pt is alert and oriented PERRL EOMI HEART IS RRR no murmur or rub LCTA no wheezing or rhales MUSCULOSKELETAL continues to have pain on the right paraspinous muscles with no overt trigger points less so than on the left she has less pain on left lateral extension as compared to right.  Her muscle tone and bulk is at baseline.  Labs  No results found for: HGBA1C Lab Results  Component Value Date   CREATININE 0.72 05/14/2015    -------------------------------------------------------------------------------------------------------------------- Lab Results  Component Value Date    WBC 12.0 (H) 05/11/2015   HGB 12.1 05/11/2015   HCT 36.1 05/11/2015   PLT 223 05/11/2015   GLUCOSE 118 (H) 05/14/2015   ALT 148 (H) 05/14/2015   AST 170 (H) 05/14/2015   NA 138 05/14/2015   K 3.7 05/14/2015   CL 105 05/14/2015   CREATININE 0.72 05/14/2015   BUN 11 05/14/2015   CO2 25 05/14/2015   TSH 0.912 05/11/2015    --------------------------------------------------------------------------------------------------------------------- Dg C-arm 1-60 Min-no Report  Result Date: 08/22/2017 Fluoroscopy was utilized by the requesting physician.  No radiographic interpretation.     Assessment & Plan:   Tami Hopkins was seen today for back pain.  Diagnoses and all orders for this visit:  Back pain at L4-L5 level  DDD (degenerative disc disease), lumbar  Facet arthritis of lumbosacral region (HCC) -     LUMBAR FACET(MEDIAL BRANCH NERVE BLOCK) MBNB -     triamcinolone acetonide (KENALOG-40) injection 80 mg; 2 mLs (80 mg total) by Other route once. -     sodium chloride flush (NS) 0.9 % injection 10 mL; 10 mLs by Other route once. -     ropivacaine (PF) 2 mg/mL (0.2%) (NAROPIN) injection 20 mL; 20 mLs by Epidural route once. -     midazolam (VERSED) 5 MG/5ML injection 5 mg; Inject 5 mLs (5 mg total) into the vein once. -     lactated ringers infusion 1,000 mL; Inject 1,000 mLs into the vein continuous. -     lidocaine (PF) (XYLOCAINE) 1 % injection 5 mL; Inject 5 mLs into the skin once. -     LUMBAR FACET(MEDIAL BRANCH NERVE BLOCK) MBNB; Future  Musculoskeletal back pain  Adolescent idiopathic scoliosis of thoracolumbar region  Chronic bilateral thoracic back pain  Other orders -     Discontinue: traMADol (ULTRAM) 50 MG tablet; Take 1 tablet (50 mg total) by mouth 3 (three) times daily. -     traMADol (ULTRAM) 50 MG tablet; Take 2 tablets (100 mg total) by mouth 3 (three) times  daily.        ----------------------------------------------------------------------------------------------------------------------  Problem List Items Addressed This Visit    None    Visit Diagnoses    Back pain at L4-L5 level    -  Primary   Relevant Medications   triamcinolone acetonide (KENALOG-40) injection 80 mg (Completed)   traMADol (ULTRAM) 50 MG tablet   DDD (degenerative disc disease), lumbar       Relevant Medications   triamcinolone acetonide (KENALOG-40) injection 80 mg (Completed)   traMADol (ULTRAM) 50 MG tablet   Facet arthritis of lumbosacral region (HCC)       Relevant Medications   triamcinolone acetonide (KENALOG-40) injection  80 mg (Completed)   sodium chloride flush (NS) 0.9 % injection 10 mL   ropivacaine (PF) 2 mg/mL (0.2%) (NAROPIN) injection 20 mL (Completed)   midazolam (VERSED) 5 MG/5ML injection 5 mg (Completed)   lactated ringers infusion 1,000 mL   lidocaine (PF) (XYLOCAINE) 1 % injection 5 mL (Completed)   traMADol (ULTRAM) 50 MG tablet   Other Relevant Orders   LUMBAR FACET(MEDIAL BRANCH NERVE BLOCK) MBNB   Musculoskeletal back pain       Relevant Medications   triamcinolone acetonide (KENALOG-40) injection 80 mg (Completed)   traMADol (ULTRAM) 50 MG tablet   Adolescent idiopathic scoliosis of thoracolumbar region       Chronic bilateral thoracic back pain       Relevant Medications   triamcinolone acetonide (KENALOG-40) injection 80 mg (Completed)   ropivacaine (PF) 2 mg/mL (0.2%) (NAROPIN) injection 20 mL (Completed)   lidocaine (PF) (XYLOCAINE) 1 % injection 5 mL (Completed)   traMADol (ULTRAM) 50 MG tablet        ----------------------------------------------------------------------------------------------------------------------  1. Back pain at L4-L5 level Continue with back stretching strengthening exercises as discussed today.  Continue with the current medication management with refill of Ultram today.  2. DDD  (degenerative disc disease), lumbar   3. Facet arthritis of lumbosacral region [facility]) We will proceed with a right side medial therapeutic lumbar facet block today.  The risks and benefits have once again been reviewed and discussed.  She desires to proceed with the above-mentioned block.  Return to clinic in approximately 1-2 months for possible bilateral block at that time.  We have also discussed options regarding a possible radiofrequency ablation. - LUMBAR FACET(MEDIAL BRANCH NERVE BLOCK) MBNB - triamcinolone acetonide (KENALOG-40) injection 80 mg; 2 mLs (80 mg total) by Other route once. - sodium chloride flush (NS) 0.9 % injection 10 mL; 10 mLs by Other route once. - ropivacaine (PF) 2 mg/mL (0.2%) (NAROPIN) injection 20 mL; 20 mLs by Epidural route once. - midazolam (VERSED) 5 MG/5ML injection 5 mg; Inject 5 mLs (5 mg total) into the vein once. - lactated ringers infusion 1,000 mL; Inject 1,000 mLs into the vein continuous. - lidocaine (PF) (XYLOCAINE) 1 % injection 5 mL; Inject 5 mLs into the skin once. - LUMBAR FACET(MEDIAL BRANCH NERVE BLOCK) MBNB; Future  4. Musculoskeletal back pain As above  5. Adolescent idiopathic scoliosis of thoracolumbar region As above  6. Chronic bilateral thoracic back pain     ----------------------------------------------------------------------------------------------------------------------  I have discontinued Tami Hopkins's traMADol. I have also changed her traMADol. Additionally, I am having her maintain her diphenoxylate-atropine, montelukast, furosemide, fluticasone, PROAIR HFA, ZETIA, KLOR-CON 10, promethazine, FLOVENT HFA, risperiDONE, zolpidem, clonazePAM, baclofen, and HYDROcodone-acetaminophen. We administered triamcinolone acetonide, ropivacaine (PF) 2 mg/mL (0.2%), midazolam, lactated ringers, and lidocaine (PF).   Meds ordered this encounter  Medications  . triamcinolone acetonide (KENALOG-40) injection 80 mg  . sodium chloride  flush (NS) 0.9 % injection 10 mL  . ropivacaine (PF) 2 mg/mL (0.2%) (NAROPIN) injection 20 mL  . midazolam (VERSED) 5 MG/5ML injection 5 mg  . lactated ringers infusion 1,000 mL  . lidocaine (PF) (XYLOCAINE) 1 % injection 5 mL  . DISCONTD: traMADol (ULTRAM) 50 MG tablet    Sig: Take 1 tablet (50 mg total) by mouth 3 (three) times daily.    Dispense:  180 tablet    Refill:  2  . traMADol (ULTRAM) 50 MG tablet    Sig: Take 2 tablets (100 mg total) by mouth 3 (three) times daily.  Dispense:  180 tablet    Refill:  2   Patient's Medications  New Prescriptions   No medications on file  Previous Medications   BACLOFEN (LIORESAL) 10 MG TABLET    Take 1 tablet (10 mg total) by mouth at bedtime.   CLONAZEPAM (KLONOPIN) 0.5 MG TABLET    Take 0.25 mg by mouth 2 (two) times daily as needed for anxiety.   DIPHENOXYLATE-ATROPINE (LOMOTIL) 2.5-0.025 MG PER TABLET    Take 1 tablet by mouth 4 (four) times daily as needed for diarrhea or loose stools.   FLOVENT HFA 110 MCG/ACT INHALER    Inhale 2 puffs into the lungs at bedtime.    FLUTICASONE (FLONASE) 50 MCG/ACT NASAL SPRAY    Place 1 spray into both nostrils 2 (two) times daily.   FUROSEMIDE (LASIX) 40 MG TABLET    Take 1 tablet by mouth as needed.    HYDROCODONE-ACETAMINOPHEN (NORCO/VICODIN) 5-325 MG TABLET    Take 1 tablet by mouth every 6 (six) hours as needed for moderate pain or severe pain.   KLOR-CON 10 10 MEQ TABLET    Take 10 mEq by mouth as needed.    MONTELUKAST (SINGULAIR) 10 MG TABLET    Take 10 mg by mouth at bedtime.   PROAIR HFA 108 (90 BASE) MCG/ACT INHALER    Inhale 2 puffs into the lungs every 4 (four) hours as needed.   PROMETHAZINE (PHENERGAN) 25 MG TABLET    Take 50 mg by mouth as needed.    RISPERIDONE (RISPERDAL) 1 MG TABLET    Take 1 tablet (1 mg total) by mouth at bedtime.   ZETIA 10 MG TABLET    Take 10 mg by mouth daily.   ZOLPIDEM (AMBIEN) 10 MG TABLET    Take 10 mg by mouth at bedtime as needed for sleep.  Modified  Medications   Modified Medication Previous Medication   TRAMADOL (ULTRAM) 50 MG TABLET traMADol (ULTRAM) 50 MG tablet      Take 2 tablets (100 mg total) by mouth 3 (three) times daily.    Take 1 tablet (50 mg total) by mouth 3 (three) times daily.  Discontinued Medications   No medications on file   ---------------------------------------------------------------------------------------------------------------------- Procedure: Right side medial branch block under fluoroscopic guidance with moderate sedation   Patient was taken to the fluoroscopy suite and placed in prone position. A total dose of 3 mg of Versed with 0 cc of fentanyl were titrated for moderate sedation. Vital signs were stable throughout the procedure. The  overlying area of skin on the right side was prepped with Betadine 3 and strict aseptic technique was utilized throughout the procedure. Flouroscopy was used to I identified the areas overlying the aforementioned facets at L2-3 L3-4 L4-5 and L5-S1.   1% lidocaine 1 cc was infiltrated subcutaneously and into the fascia with a 25-gauge needle at each of these sites. I then advanced a 22-gauge 3-1/2 inch Quinckie needle with the needle tip to lie at the "[facility]" portion of the [employer] dog". There was negative aspiration for heme or CSF and  no paresthesia. I then injected 2 cc of ropivacaine 0.2% mixed with10 mg of triamcinolone at each of the aforementioned sites. These needles were withdrawn.   The patient was convalesced discharged home stable condition for follow-up as mentioned. Follow-up: Return for evaluation, procedure.    Molli Barrows, MD

## 2017-08-23 NOTE — Telephone Encounter (Signed)
Post procedure phone call.  Patient states she is doing fine.  

## 2017-10-18 ENCOUNTER — Ambulatory Visit
Admission: RE | Admit: 2017-10-18 | Discharge: 2017-10-18 | Disposition: A | Discharge status: Home / Self Care | Payer: Medicare Other | Source: Ambulatory Visit | Attending: Anesthesiology | Admitting: Anesthesiology

## 2017-10-18 ENCOUNTER — Encounter: Payer: Self-pay | Admitting: Anesthesiology

## 2017-10-18 ENCOUNTER — Other Ambulatory Visit: Payer: Self-pay | Admitting: Anesthesiology

## 2017-10-18 ENCOUNTER — Ambulatory Visit (HOSPITAL_BASED_OUTPATIENT_CLINIC_OR_DEPARTMENT_OTHER): Payer: Medicare Other | Admitting: Anesthesiology

## 2017-10-18 VITALS — BP 128/89 | HR 73 | Temp 98.4°F | Resp 16 | Ht 68.0 in | Wt 163.0 lb

## 2017-10-18 DIAGNOSIS — M47817 Spondylosis without myelopathy or radiculopathy, lumbosacral region: Secondary | ICD-10-CM

## 2017-10-18 DIAGNOSIS — M5432 Sciatica, left side: Secondary | ICD-10-CM

## 2017-10-18 DIAGNOSIS — M549 Dorsalgia, unspecified: Secondary | ICD-10-CM

## 2017-10-18 DIAGNOSIS — M5431 Sciatica, right side: Secondary | ICD-10-CM

## 2017-10-18 DIAGNOSIS — M4697 Unspecified inflammatory spondylopathy, lumbosacral region: Secondary | ICD-10-CM

## 2017-10-18 DIAGNOSIS — M51369 Other intervertebral disc degeneration, lumbar region without mention of lumbar back pain or lower extremity pain: Secondary | ICD-10-CM

## 2017-10-18 DIAGNOSIS — R52 Pain, unspecified: Secondary | ICD-10-CM

## 2017-10-18 DIAGNOSIS — M545 Low back pain, unspecified: Secondary | ICD-10-CM

## 2017-10-18 DIAGNOSIS — M5136 Other intervertebral disc degeneration, lumbar region: Secondary | ICD-10-CM

## 2017-10-18 MED ORDER — LIDOCAINE HCL (PF) 1 % IJ SOLN
5.0000 mL | Freq: Once | INTRAMUSCULAR | Status: AC
  Administered 2017-10-18: 5 mL via SUBCUTANEOUS
  Filled 2017-10-18: qty 5

## 2017-10-18 MED ORDER — IOPAMIDOL (ISOVUE-M 200) INJECTION 41%
INTRAMUSCULAR | Status: AC
  Filled 2017-10-18: qty 10

## 2017-10-18 MED ORDER — ROPIVACAINE HCL 2 MG/ML IJ SOLN
10.0000 mL | Freq: Once | INTRAMUSCULAR | Status: AC
  Administered 2017-10-18: 10 mL via EPIDURAL
  Filled 2017-10-18: qty 10

## 2017-10-18 MED ORDER — IOPAMIDOL (ISOVUE-M 200) INJECTION 41%
20.0000 mL | Freq: Once | INTRAMUSCULAR | Status: DC | PRN
  Administered 2017-10-18: 10 mL
  Filled 2017-10-18: qty 20

## 2017-10-18 MED ORDER — TRIAMCINOLONE ACETONIDE 40 MG/ML IJ SUSP
40.0000 mg | Freq: Once | INTRAMUSCULAR | Status: AC
  Administered 2017-10-18: 40 mg
  Filled 2017-10-18: qty 1

## 2017-10-18 MED ORDER — TRAMADOL HCL 50 MG PO TABS: 100.0000 mg | ORAL_TABLET | Freq: Four times a day (QID) | ORAL | 1 refills | Status: DC

## 2017-10-18 MED ORDER — SODIUM CHLORIDE 0.9% FLUSH
10.0000 mL | Freq: Once | INTRAVENOUS | Status: AC
  Administered 2017-10-18: 10 mL

## 2017-10-18 NOTE — Patient Instructions (Signed)
Pain Management Discharge Instructions  General Discharge Instructions :  If you need to reach your doctor call: Monday-Friday 8:00 am - 4:00 pm at [phone] or toll free [phone].  After clinic hours [phone] to have operator reach doctor.  Bring all of your medication bottles to all your appointments in the pain clinic.  To cancel or reschedule your appointment with Pain Management please remember to call 24 hours in advance to avoid a fee.  Refer to the educational materials which you have been given on: General Risks, I had my Procedure. Discharge Instructions, Post Sedation.  Post Procedure Instructions:  The drugs you were given will stay in your system until tomorrow, so for the next 24 hours you should not drive, make any legal decisions or drink any alcoholic beverages.  You may eat anything you prefer, but it is better to start with liquids then soups and crackers, and gradually work up to solid foods.  Please notify your doctor immediately if you have any unusual bleeding, trouble breathing or pain that is not related to your normal pain.  Depending on the type of procedure that was done, some parts of your body may feel week and/or numb.  This usually clears up by tonight or the next day.  Walk with the use of an assistive device or accompanied by an adult for the 24 hours.  You may use ice on the affected area for the first 24 hours.  Put ice in a Ziploc bag and cover with a towel and place against area 15 minutes on 15 minutes off.  You may switch to heat after 24 hours.Epidural Steroid Injection Patient Information  Description: The epidural space surrounds the nerves as they exit the spinal cord.  In some patients, the nerves can be compressed and inflamed by a bulging disc or a tight spinal canal (spinal stenosis).  By injecting steroids into the epidural space, we can bring irritated nerves into direct contact with a potentially helpful medication.  These  steroids act directly on the irritated nerves and can reduce swelling and inflammation which often leads to decreased pain.  Epidural steroids may be injected anywhere along the spine and from the neck to the low back depending upon the location of your pain.   After numbing the skin with local anesthetic (like Novocaine), a small needle is passed into the epidural space slowly.  You may experience a sensation of pressure while this is being done.  The entire block usually last less than 10 minutes.  Conditions which may be treated by epidural steroids:   Low back and leg pain  Neck and arm pain  Spinal stenosis  Post-laminectomy syndrome  Herpes zoster (shingles) pain  Pain from compression fractures  Preparation for the injection:  1. Do not eat any solid food or dairy products within 8 hours of your appointment.  2. You may drink clear liquids up to 3 hours before appointment.  Clear liquids include water, black coffee, juice or soda.  No milk or cream please. 3. You may take your regular medication, including pain medications, with a sip of water before your appointment  Diabetics should hold regular insulin (if taken separately) and take 1/2 normal NPH dos the morning of the procedure.  Carry some sugar containing items with you to your appointment. 4. A driver must accompany you and be prepared to drive you home after your procedure.  5. Bring all your current medications with your. 6. An IV may be inserted and   sedation may be given at the discretion of the physician.   7. A blood pressure cuff, EKG and other monitors will often be applied during the procedure.  Some patients may need to have extra oxygen administered for a short period. 8. You will be asked to provide medical information, including your allergies, prior to the procedure.  We must know immediately if you are taking blood thinners (like Coumadin/Warfarin)  Or if you are allergic to IV iodine contrast (dye). We must  know if you could possible be pregnant.  Possible side-effects:  Bleeding from needle site  Infection (rare, may require surgery)  Nerve injury (rare)  Numbness & tingling (temporary)  Difficulty urinating (rare, temporary)  Spinal headache ( a headache worse with upright posture)  Light -headedness (temporary)  Pain at injection site (several days)  Decreased blood pressure (temporary)  Weakness in arm/leg (temporary)  Pressure sensation in back/neck (temporary)  Call if you experience:  Fever/chills associated with headache or increased back/neck pain.  Headache worsened by an upright position.  New onset weakness or numbness of an extremity below the injection site  Hives or difficulty breathing (go to the emergency room)  Inflammation or drainage at the infection site  Severe back/neck pain  Any new symptoms which are concerning to you  Please note:  Although the local anesthetic injected can often make your back or neck feel good for several hours after the injection, the pain will likely return.  It takes 3-7 days for steroids to work in the epidural space.  You may not notice any pain relief for at least that one week.  If effective, we will often do a series of three injections spaced 3-6 weeks apart to maximally decrease your pain.  After the initial series, we generally will wait several months before considering a repeat injection of the same type.  If you have any questions, please call [phone] [facility] 

## 2017-10-18 NOTE — Progress Notes (Signed)
Nursing Pain Medication Assessment:  Safety precautions to be maintained throughout the outpatient stay will include: orient to surroundings, keep bed in low position, maintain call bell within reach at all times, provide assistance with transfer out of bed and ambulation.  Medication Inspection Compliance: Pill count conducted under aseptic conditions, in front of the patient. Neither the pills nor the bottle was removed from the patient's sight at any time. Once count was completed pills were immediately returned to the patient in their original bottle.  Medication: Tramadol (Ultram) Pill/Patch Count: 28 of 180 pills remain Pill/Patch Appearance: Markings consistent with prescribed medication Bottle Appearance: Standard pharmacy container. Clearly labeled. Filled Date: 68 / 01 / 2018 Last Medication intake:  Yesterday

## 2017-10-19 NOTE — Progress Notes (Signed)
Subjective:  Patient ID: [name], female    DOB: Jul 30, 1961  Age: 56 y.o. MRN: [id number]  CC: Back Pain (lower bilateral and right middle back d/t scoliosis ); Shoulder Pain (bilateral); and Leg Pain (bilateral, into buttocks and to approx the knee occassionally into the feet )   Procedure: L5-S1 epidural steroid under fluoroscopic guidance without sedation  HPI Latina Frank presents for reevaluation.  She was last seen a month ago and has had a series of facet blocks for her right-sided low back pain.  These were done in July August and November.  She got good relief from these but continues to have pain radiating from the low back down into the posterior legs and into the calves.  This seems to be presently greater on the left than right side.  She is taking her baclofen twice a day in addition to clonazepam and taking Ultram up to 3 times a day.  This combination gives her some mild relief but not as effective as previous injection therapy.  She has not have any previous epidural here in the pain clinic and based on her MRI findings it would be reasonable to proceed with this.  The quality characteristic and distribution of her low back pain and leg pain to been stable in nature but leg pain seems to be greater now than the back pain.  No bowel or bladder dysfunction has been noted.  She has been taking her medications as prescribed with no evidence of diversion or illicit use.  Based on her narcotic assessment sheet she continues to derive good functional improvement with the medications as coopberated during history today.  Outpatient Medications Prior to Visit  Medication Sig Dispense Refill  . baclofen (LIORESAL) 10 MG tablet Take 1 tablet (10 mg total) by mouth at bedtime. 30 each 1  . clonazePAM (KLONOPIN) 0.5 MG tablet Take 0.25 mg by mouth daily.     . diphenoxylate-atropine (LOMOTIL) 2.5-0.025 MG per tablet Take 1 tablet by mouth 4 (four) times daily as needed for diarrhea  or loose stools.    Marland Kitchen FLOVENT HFA 110 MCG/ACT inhaler Inhale 2 puffs into the lungs at bedtime.     . fluticasone (FLONASE) 50 MCG/ACT nasal spray Place 1 spray into both nostrils 2 (two) times daily.    . furosemide (LASIX) 40 MG tablet Take 1 tablet by mouth as needed.     Marland Kitchen KLOR-CON 10 10 MEQ tablet Take 10 mEq by mouth as needed.   5  . montelukast (SINGULAIR) 10 MG tablet Take 10 mg by mouth at bedtime.    Marland Kitchen omeprazole (PRILOSEC) 40 MG capsule Take 1 capsule by mouth as needed.    Marland Kitchen PROAIR HFA 108 (90 BASE) MCG/ACT inhaler Inhale 2 puffs into the lungs every 4 (four) hours as needed.    . promethazine (PHENERGAN) 25 MG tablet Take 50 mg by mouth as needed.     . risperiDONE (RISPERDAL) 1 MG tablet Take 1 tablet (1 mg total) by mouth at bedtime. 30 tablet 2  . traZODone (DESYREL) 50 MG tablet Take 1 tablet by mouth daily.    Marland Kitchen ZETIA 10 MG tablet Take 10 mg by mouth daily.  5  . zolpidem (AMBIEN) 10 MG tablet Take 10 mg by mouth at bedtime as needed for sleep.    . traMADol (ULTRAM) 50 MG tablet Take 2 tablets (100 mg total) by mouth 3 (three) times daily. 180 tablet 2  . HYDROcodone-acetaminophen (NORCO/VICODIN) 5-325 MG tablet Take  1 tablet by mouth every 6 (six) hours as needed for moderate pain or severe pain. (Patient not taking: Reported on 08/22/2017) 40 tablet 0   No facility-administered medications prior to visit.     Review of Systems CNS: No confusion or sedation Cardiac: No angina or palpitations GI: No abdominal pain or constipation   Objective:  BP 128/89   Pulse 73   Temp 98.4 F (36.9 C) (Oral)   Resp 16   Ht 5\' 8"  (1.727 m)   Wt 163 lb (73.9 kg)   SpO2 98%   BMI 24.78 kg/m    BP Readings from Last 3 Encounters:  10/18/17 128/89  08/22/17 109/81  06/13/17 117/86     Wt Readings from Last 3 Encounters:  10/18/17 163 lb (73.9 kg)  08/22/17 169 lb (76.7 kg)  06/13/17 171 lb (77.6 kg)     Physical Exam Pt is alert and oriented PERRL EOMI HEART IS  RRR no murmur or rub LCTA no wheezing or rales MUSCULOSKELETAL she has a positive straight leg raise on the left side negative on the right she does have some paraspinous muscle tenderness bilaterally in the lumbar region but no overt trigger points.  Her muscle tone and bulk is at baseline and good.  Labs  No results found for: HGBA1C Lab Results  Component Value Date   CREATININE 0.72 05/14/2015    -------------------------------------------------------------------------------------------------------------------- Lab Results  Component Value Date   WBC 12.0 (H) 05/11/2015   HGB 12.1 05/11/2015   HCT 36.1 05/11/2015   PLT 223 05/11/2015   GLUCOSE 118 (H) 05/14/2015   ALT 148 (H) 05/14/2015   AST 170 (H) 05/14/2015   NA 138 05/14/2015   K 3.7 05/14/2015   CL 105 05/14/2015   CREATININE 0.72 05/14/2015   BUN 11 05/14/2015   CO2 25 05/14/2015   TSH 0.912 05/11/2015    --------------------------------------------------------------------------------------------------------------------- Dg C-arm 1-60 Min-no Report  Result Date: 10/18/2017 Fluoroscopy was utilized by the requesting physician.  No radiographic interpretation.     Assessment & Plan:   Lovey was seen today for back pain, shoulder pain and leg pain.  Diagnoses and all orders for this visit:  Back pain at L4-L5 level  DDD (degenerative disc disease), lumbar  Facet arthritis of lumbosacral region [facility])  Musculoskeletal back pain  Bilateral sciatica -     Lumbar Epidural Injection; Future  Other orders -     triamcinolone acetonide (KENALOG-40) injection 40 mg -     sodium chloride flush (NS) 0.9 % injection 10 mL -     ropivacaine (PF) 2 mg/mL (0.2%) (NAROPIN) injection 10 mL -     lidocaine (PF) (XYLOCAINE) 1 % injection 5 mL -     iopamidol (ISOVUE-M) 41 % intrathecal injection 20 mL -     traMADol (ULTRAM) 50 MG tablet; Take 2 tablets (100 mg total) by mouth 4 (four) times  daily.        ----------------------------------------------------------------------------------------------------------------------  Problem List Items Addressed This Visit    None    Visit Diagnoses    Back pain at L4-L5 level    -  Primary   Relevant Medications   triamcinolone acetonide (KENALOG-40) injection 40 mg (Completed)   traMADol (ULTRAM) 50 MG tablet   DDD (degenerative disc disease), lumbar       Relevant Medications   triamcinolone acetonide (KENALOG-40) injection 40 mg (Completed)   traMADol (ULTRAM) 50 MG tablet   Facet arthritis of lumbosacral region ([place])  Relevant Medications   triamcinolone acetonide (KENALOG-40) injection 40 mg (Completed)   traMADol (ULTRAM) 50 MG tablet   Musculoskeletal back pain       Relevant Medications   triamcinolone acetonide (KENALOG-40) injection 40 mg (Completed)   traMADol (ULTRAM) 50 MG tablet   Bilateral sciatica       Relevant Medications   traZODone (DESYREL) 50 MG tablet   Other Relevant Orders   Lumbar Epidural Injection        ----------------------------------------------------------------------------------------------------------------------  1. Back pain at L4-L5 level We will proceed with an L5-S1 epidural steroid for the left greater than right L5 radiculitis on clinical exam today.  I encouraged her to continue with back stretching strengthening exercises.  If she fails to gain any significant improvement I have offered her an opportunity to be evaluated by a neurosurgeon for consideration of surgical decompression if indicated.  She is refusing this at this time.  Nonetheless we have made this available to her as an option.  2. DDD (degenerative disc disease), lumbar Continue with core strengthening and as above  3. Facet arthritis of lumbosacral region [facility]) As above  4. Musculoskeletal back pain   5. Bilateral sciatica We will proceed with a first epidural injection today with return to  clinic approximately 1-2 months for reevaluation.  She is to contact us should she have any intermediate problems and in the meantime we will allow her to increase her Ultram to 2 tablets 4 times a day. - Lumbar Epidural Injection; Future    ----------------------------------------------------------------------------------------------------------------------  I have changed Toria Eagen's traMADol. I am also having her maintain her diphenoxylate-atropine, montelukast, furosemide, fluticasone, PROAIR HFA, ZETIA, KLOR-CON 10, promethazine, FLOVENT HFA, risperiDONE, zolpidem, clonazePAM, baclofen, HYDROcodone-acetaminophen, omeprazole, and traZODone. We administered triamcinolone acetonide, sodium chloride flush, ropivacaine (PF) 2 mg/mL (0.2%), lidocaine (PF), and iopamidol.   Meds ordered this encounter  Medications  . triamcinolone acetonide (KENALOG-40) injection 40 mg  . sodium chloride flush (NS) 0.9 % injection 10 mL  . ropivacaine (PF) 2 mg/mL (0.2%) (NAROPIN) injection 10 mL  . lidocaine (PF) (XYLOCAINE) 1 % injection 5 mL  . iopamidol (ISOVUE-M) 41 % intrathecal injection 20 mL  . traMADol (ULTRAM) 50 MG tablet    Sig: Take 2 tablets (100 mg total) by mouth 4 (four) times daily.    Dispense:  240 tablet    Refill:  1     Medication List        Accurate as of 10/18/17 11:59 PM. Always use your most recent med list.          baclofen 10 MG tablet Commonly known as:  LIORESAL Take 1 tablet (10 mg total) by mouth at bedtime.   clonazePAM 0.5 MG tablet Commonly known as:  KLONOPIN   diphenoxylate-atropine 2.5-0.025 MG tablet Commonly known as:  LOMOTIL   FLOVENT HFA 110 MCG/ACT inhaler Generic drug:  fluticasone   fluticasone 50 MCG/ACT nasal spray Commonly known as:  FLONASE   furosemide 40 MG tablet Commonly known as:  LASIX   HYDROcodone-acetaminophen 5-325 MG tablet Commonly known as:  NORCO/VICODIN Take 1 tablet by mouth every 6 (six) hours as needed for  moderate pain or severe pain.   KLOR-CON 10 10 MEQ tablet Generic drug:  potassium chloride   montelukast 10 MG tablet Commonly known as:  SINGULAIR   omeprazole 40 MG capsule Commonly known as:  PRILOSEC   PROAIR HFA 108 (90 Base) MCG/ACT inhaler Generic drug:  albuterol   promethazine 25 MG tablet Commonly  known as:  PHENERGAN   risperiDONE 1 MG tablet Commonly known as:  RISPERDAL Take 1 tablet (1 mg total) by mouth at bedtime.   traMADol 50 MG tablet Commonly known as:  ULTRAM Take 2 tablets (100 mg total) by mouth 4 (four) times daily.   traZODone 50 MG tablet Commonly known as:  DESYREL   ZETIA 10 MG tablet Generic drug:  ezetimibe   zolpidem 10 MG tablet Commonly known as:  AMBIEN       Where to Get Your Medications    You can get these medications from any pharmacy   Bring a paper prescription for each of these medications  traMADol 50 MG tablet    ----------------------------------------------------------------------------------------------------------------------  Follow-up: Return in about 2 months (around 12/19/2017) for evaluation, procedure.  Procedure: L5-S1 epidural steroid under fluoroscopic guidance without sedation   Procedure: L5-S1 LESI with fluoroscopic guidance and moderate sedation  NOTE: The risks, benefits, and expectations of the procedure have been discussed and explained to the patient who was understanding and in agreement with suggested treatment plan. No guarantees were made.  DESCRIPTION OF PROCEDURE: Lumbar epidural steroid injection with no IV Versed, EKG, blood pressure, pulse, and pulse oximetry monitoring. The procedure was performed with the patient in the prone position under fluoroscopic guidance.  Sterile prep x3 was initiated and I then injected subcutaneous lidocaine to the overlying L5-S1 site after its fluoroscopic identifictation.  Using strict aseptic technique, I then advanced an 18-gauge Tuohy epidural needle in  the midline using interlaminar approach via loss-of-resistance to saline technique. There was negative aspiration for heme or  CSF.  I then confirmed position with both AP and Lateral fluoroscan.  2 cc of Isovue were injected and a  total of 5 mL of Preservative-Free normal saline mixed with 40 mg of Kenalog and 1cc Ropicaine 0.2 percent were injected incrementally via the  epidurally placed needle. The needle was removed. The patient tolerated the injection well and was convalesced and discharged to home in stable condition. Should the patient have any post procedure difficulty they have been instructed on how to contact us for assistance.    Molli Barrows, MD

## 2017-10-23 ENCOUNTER — Telehealth: Payer: Self-pay

## 2017-10-23 NOTE — Telephone Encounter (Signed)
Dr [name] states he will write for her baclofen but she needs to make an appointment to come in.  Patient notified.

## 2017-10-24 ENCOUNTER — Other Ambulatory Visit: Payer: Self-pay

## 2017-10-24 ENCOUNTER — Encounter: Payer: Self-pay | Admitting: Anesthesiology

## 2017-10-24 ENCOUNTER — Ambulatory Visit: Payer: Medicare Other | Attending: Anesthesiology | Admitting: Anesthesiology

## 2017-10-24 VITALS — BP 132/78 | HR 90 | Temp 98.8°F | Ht 68.0 in | Wt 161.0 lb

## 2017-10-24 DIAGNOSIS — M545 Low back pain: Secondary | ICD-10-CM | POA: Diagnosis present

## 2017-10-24 DIAGNOSIS — M5136 Other intervertebral disc degeneration, lumbar region: Secondary | ICD-10-CM | POA: Diagnosis not present

## 2017-10-24 DIAGNOSIS — Z79899 Other long term (current) drug therapy: Secondary | ICD-10-CM | POA: Insufficient documentation

## 2017-10-24 DIAGNOSIS — M5442 Lumbago with sciatica, left side: Secondary | ICD-10-CM | POA: Insufficient documentation

## 2017-10-24 DIAGNOSIS — M5441 Lumbago with sciatica, right side: Secondary | ICD-10-CM | POA: Insufficient documentation

## 2017-10-24 DIAGNOSIS — M5431 Sciatica, right side: Secondary | ICD-10-CM

## 2017-10-24 DIAGNOSIS — M5432 Sciatica, left side: Secondary | ICD-10-CM | POA: Diagnosis not present

## 2017-10-24 MED ORDER — BACLOFEN 10 MG PO TABS: 10.0000 mg | ORAL_TABLET | Freq: Three times a day (TID) | ORAL | 0 refills | Status: DC

## 2017-10-24 NOTE — Progress Notes (Signed)
Subjective:  Patient ID: Tami Hopkins, female    DOB: 1961-07-23  Age: 57 y.o. MRN: [id number]  CC: Back Pain (lower)   Procedure: None  HPI [name] presents for reevaluation.  She was just seen a few weeks ago and had her first epidural of this most recent series.  She has had previous injections in the past and states that she got some transient relief of her lower extremity pain but her back pain is remained quite troubling.  She continues to have some right side lateral higher lumbar back pain and left lower lumbar back pain.  These are of the same quality and characteristic with mainly a spasming aching pain that responds reasonably well to her medication management.  She has been on baclofen for this spasming and it generally works but she has breakthrough during the day.  She has been on this with twice daily dosing.  She has been on multiple other antispasmodics with minimal relief.  She admits to some sensory numbness and tingling affecting the hip and buttock region with pain radiating down the left posterior leg and into the left groin no overt weakness at this point.  Outpatient Medications Prior to Visit  Medication Sig Dispense Refill  . clonazePAM (KLONOPIN) 0.5 MG tablet Take 0.25 mg by mouth daily.     . diphenoxylate-atropine (LOMOTIL) 2.5-0.025 MG per tablet Take 1 tablet by mouth 4 (four) times daily as needed for diarrhea or loose stools.    Marland Kitchen FLOVENT HFA 110 MCG/ACT inhaler Inhale 2 puffs into the lungs at bedtime.     . fluticasone (FLONASE) 50 MCG/ACT nasal spray Place 1 spray into both nostrils 2 (two) times daily.    . furosemide (LASIX) 40 MG tablet Take 1 tablet by mouth as needed.     Marland Kitchen KLOR-CON 10 10 MEQ tablet Take 10 mEq by mouth as needed.   5  . montelukast (SINGULAIR) 10 MG tablet Take 10 mg by mouth at bedtime.    Marland Kitchen omeprazole (PRILOSEC) 40 MG capsule Take 1 capsule by mouth as needed.    Marland Kitchen PROAIR HFA 108 (90 BASE) MCG/ACT inhaler Inhale 2 puffs  into the lungs every 4 (four) hours as needed.    . promethazine (PHENERGAN) 25 MG tablet Take 50 mg by mouth as needed.     . risperiDONE (RISPERDAL) 1 MG tablet Take 1 tablet (1 mg total) by mouth at bedtime. 30 tablet 2  . traMADol (ULTRAM) 50 MG tablet Take 2 tablets (100 mg total) by mouth 4 (four) times daily. 240 tablet 1  . traZODone (DESYREL) 50 MG tablet Take 1 tablet by mouth daily.    Marland Kitchen ZETIA 10 MG tablet Take 10 mg by mouth daily.  5  . zolpidem (AMBIEN) 10 MG tablet Take 10 mg by mouth at bedtime as needed for sleep.    . baclofen (LIORESAL) 10 MG tablet Take 1 tablet (10 mg total) by mouth at bedtime. 30 each 1  . HYDROcodone-acetaminophen (NORCO/VICODIN) 5-325 MG tablet Take 1 tablet by mouth every 6 (six) hours as needed for moderate pain or severe pain. (Patient not taking: Reported on 08/22/2017) 40 tablet 0   No facility-administered medications prior to visit.     Review of Systems CNS: No confusion or sedation Cardiac: No angina or palpitations GI: No abdominal pain or constipation   Objective:  BP 132/78   Pulse 90   Temp 98.8 F (37.1 C)   Ht 5\' 8"  (1.727 m)  Wt 161 lb (73 kg)   SpO2 100%   BMI 24.48 kg/m    BP Readings from Last 3 Encounters:  10/24/17 132/78  10/18/17 128/89  08/22/17 109/81     Wt Readings from Last 3 Encounters:  10/24/17 161 lb (73 kg)  10/18/17 163 lb (73.9 kg)  08/22/17 169 lb (76.7 kg)     Physical Exam Pt is alert and oriented PERRL EOMI HEART IS RRR no murmur or rub LCTA no wheezing or rales MUSCULOSKELETAL she has evidence of a scoliotic defect in the upper lumbar region with tenderness throughout the paraspinous musculature in the lumbar region.  Her strength appears to be at baseline.  Labs  No results found for: HGBA1C Lab Results  Component Value Date   CREATININE 0.72 05/14/2015    -------------------------------------------------------------------------------------------------------------------- Lab  Results  Component Value Date   WBC 12.0 (H) 05/11/2015   HGB 12.1 05/11/2015   HCT 36.1 05/11/2015   PLT 223 05/11/2015   GLUCOSE 118 (H) 05/14/2015   ALT 148 (H) 05/14/2015   AST 170 (H) 05/14/2015   NA 138 05/14/2015   K 3.7 05/14/2015   CL 105 05/14/2015   CREATININE 0.72 05/14/2015   BUN 11 05/14/2015   CO2 25 05/14/2015   TSH 0.912 05/11/2015    --------------------------------------------------------------------------------------------------------------------- Dg C-arm 1-60 Min-no Report  Result Date: 10/18/2017 Fluoroscopy was utilized by the requesting physician.  No radiographic interpretation.     Assessment & Plan:   Makinlee was seen today for back pain.  Diagnoses and all orders for this visit:  DDD (degenerative disc disease), lumbar -     Ambulatory referral to Neurosurgery  Bilateral sciatica -     Ambulatory referral to Neurosurgery  Other orders -     baclofen (LIORESAL) 10 MG tablet; Take 1 tablet (10 mg total) by mouth 3 (three) times daily.        ----------------------------------------------------------------------------------------------------------------------  Problem List Items Addressed This Visit    None    Visit Diagnoses    DDD (degenerative disc disease), lumbar    -  Primary   Relevant Medications   baclofen (LIORESAL) 10 MG tablet   Other Relevant Orders   Ambulatory referral to Neurosurgery   Bilateral sciatica       Relevant Medications   baclofen (LIORESAL) 10 MG tablet   Other Relevant Orders   Ambulatory referral to Neurosurgery        ----------------------------------------------------------------------------------------------------------------------  1. DDD (degenerative disc disease), lumbar Secondary to the chronicity of her back pain and her symptom complex I think it would be reasonable for her to get an evaluation with neurosurgery to further determine whether she may be a surgical candidate.  In the  meantime we will schedule her for return to clinic in 2 months - Ambulatory referral to Neurosurgery  2. Bilateral sciatica At this point she refuses any further interventional therapy however based on her findings she may be a candidate for an SI injection considering the scoliotic deformity and distribution of her left leg pain.  I will write for an increase in baclofen to 3 times daily dosing.  This seems to be well tolerated by her.   - Ambulatory referral to Neurosurgery    ----------------------------------------------------------------------------------------------------------------------  I have discontinued Driana Tapley's HYDROcodone-acetaminophen. I have also changed her baclofen. Additionally, I am having her maintain her diphenoxylate-atropine, montelukast, furosemide, fluticasone, PROAIR HFA, ZETIA, KLOR-CON 10, promethazine, FLOVENT HFA, risperiDONE, zolpidem, clonazePAM, omeprazole, traZODone, and traMADol.   Meds ordered this encounter  Medications  . baclofen (LIORESAL) 10 MG tablet    Sig: Take 1 tablet (10 mg total) by mouth 3 (three) times daily.    Dispense:  90 tablet    Refill:  0     Medication List        Accurate as of 10/24/17  2:17 PM. Always use your most recent med list.          baclofen 10 MG tablet Commonly known as:  LIORESAL Take 1 tablet (10 mg total) by mouth 3 (three) times daily.   clonazePAM 0.5 MG tablet Commonly known as:  KLONOPIN   diphenoxylate-atropine 2.5-0.025 MG tablet Commonly known as:  LOMOTIL   FLOVENT HFA 110 MCG/ACT inhaler Generic drug:  fluticasone   fluticasone 50 MCG/ACT nasal spray Commonly known as:  FLONASE   furosemide 40 MG tablet Commonly known as:  LASIX   KLOR-CON 10 10 MEQ tablet Generic drug:  potassium chloride   montelukast 10 MG tablet Commonly known as:  SINGULAIR   omeprazole 40 MG capsule Commonly known as:  PRILOSEC   PROAIR HFA 108 (90 Base) MCG/ACT inhaler Generic drug:   albuterol   promethazine 25 MG tablet Commonly known as:  PHENERGAN   risperiDONE 1 MG tablet Commonly known as:  RISPERDAL Take 1 tablet (1 mg total) by mouth at bedtime.   traMADol 50 MG tablet Commonly known as:  ULTRAM Take 2 tablets (100 mg total) by mouth 4 (four) times daily.   traZODone 50 MG tablet Commonly known as:  DESYREL   ZETIA 10 MG tablet Generic drug:  ezetimibe   zolpidem 10 MG tablet Commonly known as:  AMBIEN       Where to Get Your Medications    These medications were sent to [id number] - [place], [place], [place] [address]   Phone:  [phone]   baclofen 10 MG tablet    ----------------------------------------------------------------------------------------------------------------------  Follow-up: Return in about 2 months (around 12/22/2017) for evaluation, med refill.    Molli Barrows, MD

## 2017-10-24 NOTE — Patient Instructions (Signed)
Instructed baclofen will be at pharmacy.  Referral top Dr[facility]

## 2017-10-28 NOTE — Telephone Encounter (Signed)
Patient has appt for 12-19-17 she got one script for baclofen but will need refill before her appt. Please call patient

## 2017-10-28 NOTE — Telephone Encounter (Signed)
Spoke with patient, her Rx for /*Baclofen did not have the refill on it,  Patient has appt 12/19/17, [facility] and gave Rx for baclofen 10 mg 1 po tid qty 90 to not fill until 11/23/17.

## 2017-11-19 DIAGNOSIS — R110 Nausea: Secondary | ICD-10-CM | POA: Insufficient documentation

## 2017-11-21 ENCOUNTER — Other Ambulatory Visit: Payer: Self-pay | Admitting: Pediatrics

## 2017-11-21 DIAGNOSIS — Z1231 Encounter for screening mammogram for malignant neoplasm of breast: Secondary | ICD-10-CM

## 2017-12-03 ENCOUNTER — Ambulatory Visit
Admission: RE | Admit: 2017-12-03 | Discharge: 2017-12-03 | Disposition: A | Discharge status: Home / Self Care | Payer: Medicare Other | Source: Ambulatory Visit | Attending: Neurosurgery | Admitting: Neurosurgery

## 2017-12-03 ENCOUNTER — Other Ambulatory Visit: Payer: Self-pay | Admitting: Neurosurgery

## 2017-12-03 DIAGNOSIS — M4184 Other forms of scoliosis, thoracic region: Secondary | ICD-10-CM | POA: Insufficient documentation

## 2017-12-03 DIAGNOSIS — K6289 Other specified diseases of anus and rectum: Secondary | ICD-10-CM

## 2017-12-03 DIAGNOSIS — M545 Low back pain, unspecified: Secondary | ICD-10-CM

## 2017-12-03 DIAGNOSIS — M4186 Other forms of scoliosis, lumbar region: Secondary | ICD-10-CM | POA: Diagnosis not present

## 2017-12-03 DIAGNOSIS — G8929 Other chronic pain: Secondary | ICD-10-CM

## 2017-12-03 DIAGNOSIS — M419 Scoliosis, unspecified: Secondary | ICD-10-CM | POA: Diagnosis present

## 2017-12-19 ENCOUNTER — Encounter: Payer: Self-pay | Admitting: Anesthesiology

## 2017-12-19 ENCOUNTER — Other Ambulatory Visit: Payer: Self-pay

## 2017-12-19 ENCOUNTER — Ambulatory Visit: Payer: Medicare Other | Attending: Anesthesiology | Admitting: Anesthesiology

## 2017-12-19 VITALS — BP 116/75 | HR 91 | Temp 98.4°F | Resp 18 | Ht 68.0 in | Wt 157.0 lb

## 2017-12-19 DIAGNOSIS — M419 Scoliosis, unspecified: Secondary | ICD-10-CM | POA: Insufficient documentation

## 2017-12-19 DIAGNOSIS — M5136 Other intervertebral disc degeneration, lumbar region: Secondary | ICD-10-CM | POA: Diagnosis not present

## 2017-12-19 DIAGNOSIS — M545 Low back pain: Secondary | ICD-10-CM | POA: Diagnosis present

## 2017-12-19 DIAGNOSIS — M51369 Other intervertebral disc degeneration, lumbar region without mention of lumbar back pain or lower extremity pain: Secondary | ICD-10-CM

## 2017-12-19 DIAGNOSIS — M4120 Other idiopathic scoliosis, site unspecified: Secondary | ICD-10-CM | POA: Diagnosis not present

## 2017-12-19 DIAGNOSIS — M25551 Pain in right hip: Secondary | ICD-10-CM | POA: Insufficient documentation

## 2017-12-19 DIAGNOSIS — G8929 Other chronic pain: Secondary | ICD-10-CM | POA: Diagnosis not present

## 2017-12-19 DIAGNOSIS — Z79899 Other long term (current) drug therapy: Secondary | ICD-10-CM | POA: Insufficient documentation

## 2017-12-19 DIAGNOSIS — R1032 Left lower quadrant pain: Secondary | ICD-10-CM | POA: Diagnosis not present

## 2017-12-19 DIAGNOSIS — M25552 Pain in left hip: Secondary | ICD-10-CM | POA: Diagnosis not present

## 2017-12-19 DIAGNOSIS — M41125 Adolescent idiopathic scoliosis, thoracolumbar region: Secondary | ICD-10-CM

## 2017-12-19 DIAGNOSIS — Z79891 Long term (current) use of opiate analgesic: Secondary | ICD-10-CM | POA: Insufficient documentation

## 2017-12-19 DIAGNOSIS — M5431 Sciatica, right side: Secondary | ICD-10-CM | POA: Insufficient documentation

## 2017-12-19 DIAGNOSIS — M4125 Other idiopathic scoliosis, thoracolumbar region: Secondary | ICD-10-CM | POA: Diagnosis not present

## 2017-12-19 DIAGNOSIS — M546 Pain in thoracic spine: Secondary | ICD-10-CM | POA: Diagnosis not present

## 2017-12-19 DIAGNOSIS — F1190 Opioid use, unspecified, uncomplicated: Secondary | ICD-10-CM | POA: Diagnosis not present

## 2017-12-19 DIAGNOSIS — M5432 Sciatica, left side: Secondary | ICD-10-CM | POA: Diagnosis not present

## 2017-12-19 MED ORDER — HYDROCODONE-ACETAMINOPHEN 5-325 MG PO TABS: 1.0000 | ORAL_TABLET | Freq: Two times a day (BID) | ORAL | 0 refills | Status: DC

## 2017-12-19 MED ORDER — BACLOFEN 10 MG PO TABS
10.0000 mg | ORAL_TABLET | Freq: Three times a day (TID) | ORAL | 1 refills | Status: DC
Start: 2017-12-19 — End: 2018-01-29

## 2017-12-19 NOTE — Progress Notes (Signed)
Safety precautions to be maintained throughout the outpatient stay will include: orient to surroundings, keep bed in low position, maintain call bell within reach at all times, provide assistance with transfer out of bed and ambulation.  

## 2017-12-19 NOTE — Progress Notes (Signed)
Subjective:  Patient ID: Tami Hopkins, female    DOB: [birth year]  Age: 57 y.o. MRN: [id number]  CC: Back Pain (lower); Groin Pain (left); and Hip Pain (bilateral)   Procedure: None  HPI Tami Hopkins presents for reevaluation.  She was last seen in January for evaluation.  She has had epidural steroids for her scoliotic low back pathology.  She is also had sciatica symptoms and the epidurals gave her short-term temporary relief lasting no more than a day.  She has had recurrence of the same quality characteristic distribution of lower extremity pain.  She did see Dr. Cari Caraway for evaluation and he is following her.  At this point she is considered a nonsurgical candidate.  She has been on tramadol for her low back pain however this is insufficient to give her adequate relief.  She maintains she continues to have spasming in her calves and low back at night and persistent pain that is not relieved by the tramadol.  Furthermore, she denies change in her lower extremity strength with no new change in bowel or bladder function.  She is followed by psych psychiatry for depression.  She denies any diverting or illicit use with her medications and also denies homicidal suicidal ideation.  Outpatient Medications Prior to Visit  Medication Sig Dispense Refill  . clonazePAM (KLONOPIN) 0.5 MG tablet Take 0.25 mg by mouth daily.     . diphenoxylate-atropine (LOMOTIL) 2.5-0.025 MG per tablet Take 1 tablet by mouth 4 (four) times daily as needed for diarrhea or loose stools.    Marland Kitchen FLOVENT HFA 110 MCG/ACT inhaler Inhale 2 puffs into the lungs at bedtime.     . fluticasone (FLONASE) 50 MCG/ACT nasal spray Place 1 spray into both nostrils 2 (two) times daily.    . furosemide (LASIX) 40 MG tablet Take 1 tablet by mouth as needed.     Marland Kitchen KLOR-CON 10 10 MEQ tablet Take 10 mEq by mouth as needed.   5  . montelukast (SINGULAIR) 10 MG tablet Take 10 mg by mouth at bedtime.    Marland Kitchen omeprazole (PRILOSEC) 40 MG  capsule Take 1 capsule by mouth as needed.    Marland Kitchen PROAIR HFA 108 (90 BASE) MCG/ACT inhaler Inhale 2 puffs into the lungs every 4 (four) hours as needed.    . promethazine (PHENERGAN) 25 MG tablet Take 50 mg by mouth as needed.     . risperiDONE (RISPERDAL) 1 MG tablet Take 1 tablet (1 mg total) by mouth at bedtime. 30 tablet 2  . traMADol (ULTRAM) 50 MG tablet Take 2 tablets (100 mg total) by mouth 4 (four) times daily. 240 tablet 1  . traZODone (DESYREL) 50 MG tablet Take 1 tablet by mouth daily.    Marland Kitchen ZETIA 10 MG tablet Take 10 mg by mouth daily.  5  . zolpidem (AMBIEN) 10 MG tablet Take 10 mg by mouth at bedtime as needed for sleep.     No facility-administered medications prior to visit.     Review of Systems CNS: No confusion or sedation Cardiac: No angina or palpitations GI: No abdominal pain or constipation Constitutional: No nausea vomiting fevers or chills  Objective:  BP 116/75   Pulse 91   Temp 98.4 F (36.9 C) (Oral)   Resp 18   Ht 5\' 8"  (1.727 m)   Wt 157 lb (71.2 kg)   SpO2 98%   BMI 23.87 kg/m    BP Readings from Last 3 Encounters:  12/19/17 116/75  10/24/17 132/78  10/18/17 128/89     Wt Readings from Last 3 Encounters:  12/19/17 157 lb (71.2 kg)  10/24/17 161 lb (73 kg)  10/18/17 163 lb (73.9 kg)     Physical Exam Pt is alert and oriented PERRL EOMI HEART IS RRR no murmur or rub LCTA no wheezing or rales MUSCULOSKELETAL reveals some persistent paraspinous muscle tenderness but no overt trigger points.  Her strength appears to be well-preserved with no change noted on examination.  Labs  No results found for: HGBA1C Lab Results  Component Value Date   CREATININE 0.72 05/14/2015    -------------------------------------------------------------------------------------------------------------------- Lab Results  Component Value Date   WBC 12.0 (H) 05/11/2015   HGB 12.1 05/11/2015   HCT 36.1 05/11/2015   PLT 223 05/11/2015   GLUCOSE 118 (H)  05/14/2015   ALT 148 (H) 05/14/2015   AST 170 (H) 05/14/2015   NA 138 05/14/2015   K 3.7 05/14/2015   CL 105 05/14/2015   CREATININE 0.72 05/14/2015   BUN 11 05/14/2015   CO2 25 05/14/2015   TSH 0.912 05/11/2015    --------------------------------------------------------------------------------------------------------------------- Dg Scoliosis Eval Complete Spine 2 Or 3 Views  Result Date: 12/03/2017 CLINICAL DATA:  Chronic lower back pain. EXAM: DG SCOLIOSIS EVAL COMPLETE SPINE 2-3V COMPARISON:  None. FINDINGS: 40 degrees of dextroscoliosis is seen involving the lower thoracic and lumbar spine, centered at the L1 level. No fracture or spondylolisthesis is noted. Multilevel degenerative disc disease is noted in the lumbar spine. IMPRESSION: 40 degrees of dextroscoliosis is seen involving the lower thoracic and lumbar spine. Electronically Signed   By: Marijo Conception, M.D.   On: 12/03/2017 11:49     Assessment & Plan:   Tami Hopkins was seen today for back pain, groin pain and hip pain.  Diagnoses and all orders for this visit:  DDD (degenerative disc disease), lumbar -     ToxASSURE Select 13 (MW), Urine  Bilateral sciatica  Scoliosis (and kyphoscoliosis), idiopathic  Chronic, continuous use of opioids -     Cancel: ToxASSURE Select 13 (MW), Urine -     ToxASSURE Select 13 (MW), Urine  Adolescent idiopathic scoliosis of thoracolumbar region  Chronic bilateral thoracic back pain  Other orders -     HYDROcodone-acetaminophen (NORCO/VICODIN) 5-325 MG tablet; Take 1 tablet by mouth 2 (two) times daily. -     baclofen (LIORESAL) 10 MG tablet; Take 1 tablet (10 mg total) by mouth 3 (three) times daily.        ----------------------------------------------------------------------------------------------------------------------  Problem List Items Addressed This Visit    None    Visit Diagnoses    DDD (degenerative disc disease), lumbar    -  Primary   Relevant Medications    HYDROcodone-acetaminophen (NORCO/VICODIN) 5-325 MG tablet   baclofen (LIORESAL) 10 MG tablet   Other Relevant Orders   ToxASSURE Select 13 (MW), Urine   Bilateral sciatica       Relevant Medications   baclofen (LIORESAL) 10 MG tablet   Scoliosis (and kyphoscoliosis), idiopathic       Chronic, continuous use of opioids       Relevant Orders   ToxASSURE Select 13 (MW), Urine   Adolescent idiopathic scoliosis of thoracolumbar region       Chronic bilateral thoracic back pain       Relevant Medications   HYDROcodone-acetaminophen (NORCO/VICODIN) 5-325 MG tablet   baclofen (LIORESAL) 10 MG tablet        ----------------------------------------------------------------------------------------------------------------------  1. DDD (degenerative disc disease), lumbar Secondary to the chronicity and  severity of her pain I am going to switch her over to hydrocodone to be taken 1 tablet p.o. twice daily.  A prescription was given for today and she is to return to clinic in 1 month for reevaluation.  She is to continue follow-up with her primary care physician and psychiatry.  We will also request a UDS today for baseline evaluation and she will be seen on a monthly basis for her opioid medications to begin with.  She is to return to clinic in 1 month - ToxASSURE Select 13 (MW), Urine  2. Bilateral sciatica Continue back stretching strengthening exercises.  Continue follow-up with Dr. Cari Caraway and we will defer on repeat injection at this point.  3. Scoliosis (and kyphoscoliosis), idiopathic   4. Chronic, continuous use of opioids As above.  We have reviewed the [facility] practitioner database information and it is appropriate. - ToxASSURE Select 13 (MW), Urine  5. Adolescent idiopathic scoliosis of thoracolumbar region   6. Chronic bilateral thoracic back  pain    ----------------------------------------------------------------------------------------------------------------------  I have discontinued Tami Hopkins's baclofen. I am also having her start on HYDROcodone-acetaminophen and baclofen. Additionally, I am having her maintain her diphenoxylate-atropine, montelukast, furosemide, fluticasone, PROAIR HFA, ZETIA, KLOR-CON 10, promethazine, FLOVENT HFA, risperiDONE, zolpidem, clonazePAM, omeprazole, traZODone, and traMADol.   Meds ordered this encounter  Medications  . HYDROcodone-acetaminophen (NORCO/VICODIN) 5-325 MG tablet    Sig: Take 1 tablet by mouth 2 (two) times daily.    Dispense:  60 tablet    Refill:  0  . baclofen (LIORESAL) 10 MG tablet    Sig: Take 1 tablet (10 mg total) by mouth 3 (three) times daily.    Dispense:  90 tablet    Refill:  1   Patient's Medications  New Prescriptions   BACLOFEN (LIORESAL) 10 MG TABLET    Take 1 tablet (10 mg total) by mouth 3 (three) times daily.   HYDROCODONE-ACETAMINOPHEN (NORCO/VICODIN) 5-325 MG TABLET    Take 1 tablet by mouth 2 (two) times daily.  Previous Medications   CLONAZEPAM (KLONOPIN) 0.5 MG TABLET    Take 0.25 mg by mouth daily.    DIPHENOXYLATE-ATROPINE (LOMOTIL) 2.5-0.025 MG PER TABLET    Take 1 tablet by mouth 4 (four) times daily as needed for diarrhea or loose stools.   FLOVENT HFA 110 MCG/ACT INHALER    Inhale 2 puffs into the lungs at bedtime.    FLUTICASONE (FLONASE) 50 MCG/ACT NASAL SPRAY    Place 1 spray into both nostrils 2 (two) times daily.   FUROSEMIDE (LASIX) 40 MG TABLET    Take 1 tablet by mouth as needed.    KLOR-CON 10 10 MEQ TABLET    Take 10 mEq by mouth as needed.    MONTELUKAST (SINGULAIR) 10 MG TABLET    Take 10 mg by mouth at bedtime.   OMEPRAZOLE (PRILOSEC) 40 MG CAPSULE    Take 1 capsule by mouth as needed.   PROAIR HFA 108 (90 BASE) MCG/ACT INHALER    Inhale 2 puffs into the lungs every 4 (four) hours as needed.   PROMETHAZINE (PHENERGAN) 25 MG  TABLET    Take 50 mg by mouth as needed.    RISPERIDONE (RISPERDAL) 1 MG TABLET    Take 1 tablet (1 mg total) by mouth at bedtime.   TRAMADOL (ULTRAM) 50 MG TABLET    Take 2 tablets (100 mg total) by mouth 4 (four) times daily.   TRAZODONE (DESYREL) 50 MG TABLET    Take 1  tablet by mouth daily.   ZETIA 10 MG TABLET    Take 10 mg by mouth daily.   ZOLPIDEM (AMBIEN) 10 MG TABLET    Take 10 mg by mouth at bedtime as needed for sleep.  Modified Medications   No medications on file  Discontinued Medications   BACLOFEN (LIORESAL) 10 MG TABLET    Take 1 tablet (10 mg total) by mouth 3 (three) times daily.   ----------------------------------------------------------------------------------------------------------------------  Follow-up: Return in about 1 month (around 01/16/2018) for evaluation, med refill.    Molli Barrows, MD

## 2017-12-19 NOTE — Progress Notes (Signed)
Nursing Pain Medication Assessment:  Safety precautions to be maintained throughout the outpatient stay will include: orient to surroundings, keep bed in low position, maintain call bell within reach at all times, provide assistance with transfer out of bed and ambulation.  Medication Inspection Compliance: Pill count conducted under aseptic conditions, in front of the patient. Neither the pills nor the bottle was removed from the patient's sight at any time. Once count was completed pills were immediately returned to the patient in their original bottle.  Medication: Tramadol (Ultram) Pill/Patch Count: 2 of 240 pills remain Pill/Patch Appearance: Markings consistent with prescribed medication Bottle Appearance: Standard pharmacy container. Clearly labeled. Filled Date: 01 / 26 / 2019 Last Medication intake:  Yesterday

## 2017-12-23 LAB — TOXASSURE SELECT 13 (MW), URINE

## 2017-12-27 ENCOUNTER — Telehealth: Payer: Self-pay | Admitting: Anesthesiology

## 2017-12-27 ENCOUNTER — Telehealth: Payer: Self-pay

## 2017-12-27 NOTE — Telephone Encounter (Signed)
See note from telephone call

## 2017-12-27 NOTE — Telephone Encounter (Signed)
Patient states that she was prescribed Hydrocodone  That was ordered on 12/19/17 and is having diarrhea and jitteriness. Patient also states she is having trouble sleeping. Said that one pill was not helping with the pain and about two hours later she took another pill. Informed patient that she needed to take medications as prescribe and that it was ordered 1 pid BID. Instructed to stop taking and I would talk to Dr [name] when he came in today and call her back.

## 2017-12-27 NOTE — Telephone Encounter (Signed)
Patient was switched to Hydrocodone and is having some problems with taking it. Please call her

## 2017-12-27 NOTE — Telephone Encounter (Signed)
No answer when calling back

## 2017-12-30 ENCOUNTER — Telehealth: Payer: Self-pay | Admitting: *Deleted

## 2017-12-30 NOTE — Telephone Encounter (Signed)
Called  Patient and left message that she will need to come in for a appt. We have an opening for Thursday March 14 at 1:00 and reinforced that she will need to bring in her current medications to change

## 2017-12-30 NOTE — Telephone Encounter (Signed)
Pt states that when she takes Vicodin she states it makes her jittering  Feeling, Instructed to stop taking medication and that she would need to bring meds with her to her appt if something else were to be prescribed. Pt states that she was given Percocet when she had teeth extracted and it did well for her.    Need to ask Dr [name] what he would like to do  Next appt 3/20

## 2018-01-02 ENCOUNTER — Encounter: Payer: Self-pay | Admitting: Anesthesiology

## 2018-01-02 ENCOUNTER — Ambulatory Visit: Payer: Medicare Other | Attending: Anesthesiology | Admitting: Anesthesiology

## 2018-01-02 ENCOUNTER — Other Ambulatory Visit: Payer: Self-pay

## 2018-01-02 VITALS — BP 114/80 | HR 100 | Temp 99.1°F | Resp 16 | Ht 67.0 in | Wt 156.0 lb

## 2018-01-02 DIAGNOSIS — Z79899 Other long term (current) drug therapy: Secondary | ICD-10-CM | POA: Insufficient documentation

## 2018-01-02 DIAGNOSIS — Z79891 Long term (current) use of opiate analgesic: Secondary | ICD-10-CM | POA: Insufficient documentation

## 2018-01-02 DIAGNOSIS — M5431 Sciatica, right side: Secondary | ICD-10-CM

## 2018-01-02 DIAGNOSIS — M5432 Sciatica, left side: Secondary | ICD-10-CM | POA: Diagnosis not present

## 2018-01-02 DIAGNOSIS — M4697 Unspecified inflammatory spondylopathy, lumbosacral region: Secondary | ICD-10-CM | POA: Diagnosis not present

## 2018-01-02 DIAGNOSIS — M4120 Other idiopathic scoliosis, site unspecified: Secondary | ICD-10-CM | POA: Insufficient documentation

## 2018-01-02 DIAGNOSIS — M5136 Other intervertebral disc degeneration, lumbar region: Secondary | ICD-10-CM | POA: Diagnosis not present

## 2018-01-02 DIAGNOSIS — M47817 Spondylosis without myelopathy or radiculopathy, lumbosacral region: Secondary | ICD-10-CM

## 2018-01-02 DIAGNOSIS — M549 Dorsalgia, unspecified: Secondary | ICD-10-CM | POA: Diagnosis present

## 2018-01-02 DIAGNOSIS — F1190 Opioid use, unspecified, uncomplicated: Secondary | ICD-10-CM

## 2018-01-02 MED ORDER — OXYCODONE-ACETAMINOPHEN 5-325 MG PO TABS: 1.0000 | ORAL_TABLET | Freq: Two times a day (BID) | ORAL | 0 refills | Status: DC

## 2018-01-02 NOTE — Progress Notes (Signed)
Subjective:  Patient ID: Tami Hopkins, female    DOB: [birth year]  Age: 57 y.o. MRN: [id number]  CC: Back Pain (upper, mid and lower) and Groin Pain (left)   Procedure: None  HPI Tami Hopkins presents for reevaluation.  She was seen approximately a month ago and started on hydrocodone.  She mentions that she has had some jitteriness from the medication.  As a result she discontinued it.  In the past she reports that she has taken Percocet and this was successful for her pain relief.  She has been followed by Dr. Cari Caraway and found to be a nonsurgical candidate.  The quality characteristic and distribution of her lower back pain has been stable in nature with no new changes in lower extremity strength or function.  I have reviewed her narcotic assessment sheet and she continues to derive relief with opioid medications and an improvement in her functional status with the medication in the past.  Ultram seemed to help but was insufficient in the past.  Outpatient Medications Prior to Visit  Medication Sig Dispense Refill  . baclofen (LIORESAL) 10 MG tablet Take 1 tablet (10 mg total) by mouth 3 (three) times daily. 90 tablet 1  . clonazePAM (KLONOPIN) 0.5 MG tablet Take 0.25 mg by mouth daily.     Marland Kitchen zolpidem (AMBIEN) 10 MG tablet Take 10 mg by mouth at bedtime as needed for sleep.    . diphenoxylate-atropine (LOMOTIL) 2.5-0.025 MG per tablet Take 1 tablet by mouth 4 (four) times daily as needed for diarrhea or loose stools.    Marland Kitchen FLOVENT HFA 110 MCG/ACT inhaler Inhale 2 puffs into the lungs at bedtime.     . fluticasone (FLONASE) 50 MCG/ACT nasal spray Place 1 spray into both nostrils 2 (two) times daily.    . furosemide (LASIX) 40 MG tablet Take 1 tablet by mouth as needed.     Marland Kitchen HYDROcodone-acetaminophen (NORCO/VICODIN) 5-325 MG tablet Take 1 tablet by mouth 2 (two) times daily. (Patient not taking: Reported on 01/02/2018) 60 tablet 0  . KLOR-CON 10 10 MEQ tablet Take 10 mEq by mouth as  needed.   5  . montelukast (SINGULAIR) 10 MG tablet Take 10 mg by mouth at bedtime.    Marland Kitchen omeprazole (PRILOSEC) 40 MG capsule Take 1 capsule by mouth as needed.    Marland Kitchen PROAIR HFA 108 (90 BASE) MCG/ACT inhaler Inhale 2 puffs into the lungs every 4 (four) hours as needed.    . promethazine (PHENERGAN) 25 MG tablet Take 50 mg by mouth as needed.     . risperiDONE (RISPERDAL) 1 MG tablet Take 1 tablet (1 mg total) by mouth at bedtime. 30 tablet 2  . traMADol (ULTRAM) 50 MG tablet Take 2 tablets (100 mg total) by mouth 4 (four) times daily. (Patient not taking: Reported on 01/02/2018) 240 tablet 1  . traZODone (DESYREL) 50 MG tablet Take 1 tablet by mouth daily.    Marland Kitchen ZETIA 10 MG tablet Take 10 mg by mouth daily.  5   No facility-administered medications prior to visit.     Review of Systems CNS: No confusion or sedation Cardiac: No angina or palpitations GI: No abdominal pain or constipation Constitutional: No nausea vomiting fevers or chills  Objective:  BP 114/80   Pulse 100   Temp 99.1 F (37.3 C) (Oral)   Resp 16   Ht 5\' 7"  (1.702 m)   Wt 156 lb (70.8 kg)   SpO2 99%   BMI 24.43 kg/m  BP Readings from Last 3 Encounters:  01/02/18 114/80  12/19/17 116/75  10/24/17 132/78     Wt Readings from Last 3 Encounters:  01/02/18 156 lb (70.8 kg)  12/19/17 157 lb (71.2 kg)  10/24/17 161 lb (73 kg)     Physical Exam Pt is alert and oriented PERRL EOMI HEART IS RRR no murmur or rub LCTA no wheezing or rales MUSCULOSKELETAL reveals good muscle tone and bulk and no trigger points noted in the low back region.  Labs  No results found for: HGBA1C Lab Results  Component Value Date   CREATININE 0.72 05/14/2015    -------------------------------------------------------------------------------------------------------------------- Lab Results  Component Value Date   WBC 12.0 (H) 05/11/2015   HGB 12.1 05/11/2015   HCT 36.1 05/11/2015   PLT 223 05/11/2015   GLUCOSE 118 (H)  05/14/2015   ALT 148 (H) 05/14/2015   AST 170 (H) 05/14/2015   NA 138 05/14/2015   K 3.7 05/14/2015   CL 105 05/14/2015   CREATININE 0.72 05/14/2015   BUN 11 05/14/2015   CO2 25 05/14/2015   TSH 0.912 05/11/2015    --------------------------------------------------------------------------------------------------------------------- Dg Scoliosis Eval Complete Spine 2 Or 3 Views  Result Date: 12/03/2017 CLINICAL DATA:  Chronic lower back pain. EXAM: DG SCOLIOSIS EVAL COMPLETE SPINE 2-3V COMPARISON:  None. FINDINGS: 40 degrees of dextroscoliosis is seen involving the lower thoracic and lumbar spine, centered at the L1 level. No fracture or spondylolisthesis is noted. Multilevel degenerative disc disease is noted in the lumbar spine. IMPRESSION: 40 degrees of dextroscoliosis is seen involving the lower thoracic and lumbar spine. Electronically Signed   By: Marijo Conception, M.D.   On: 12/03/2017 11:49     Assessment & Plan:   Tami Hopkins was seen today for back pain and groin pain.  Diagnoses and all orders for this visit:  DDD (degenerative disc disease), lumbar  Bilateral sciatica  Scoliosis (and kyphoscoliosis), idiopathic  Chronic, continuous use of opioids  Facet arthritis of lumbosacral region [facility])  Musculoskeletal back pain  Other orders -     Discontinue: oxyCODONE-acetaminophen (PERCOCET) 5-325 MG tablet; Take 1 tablet by mouth 2 (two) times daily. -     oxyCODONE-acetaminophen (PERCOCET) 5-325 MG tablet; Take 1 tablet by mouth 2 (two) times daily. Do not take after 7 pm        ----------------------------------------------------------------------------------------------------------------------  Problem List Items Addressed This Visit    None    Visit Diagnoses    DDD (degenerative disc disease), lumbar    -  Primary   Relevant Medications   oxyCODONE-acetaminophen (PERCOCET) 5-325 MG tablet   Bilateral sciatica       Scoliosis (and kyphoscoliosis), idiopathic        Chronic, continuous use of opioids       Facet arthritis of lumbosacral region ([place])       Relevant Medications   oxyCODONE-acetaminophen (PERCOCET) 5-325 MG tablet   Musculoskeletal back pain       Relevant Medications   oxyCODONE-acetaminophen (PERCOCET) 5-325 MG tablet        ----------------------------------------------------------------------------------------------------------------------  1. DDD (degenerative disc disease), lumbar Discontinue the hydrocodone and I am going to move her to Percocet 5 mg tablets to be taken twice a day.  I have cautioned her against simultaneous utilization of her other benzodiazepines that she takes for sleep at night.  I am suggesting that she not take the Percocet after 7 PM secondary to the Ambien or Klonopin she uses at nighttime.  We have gone over the risks  of these simultaneous dosing of medication.  2. Bilateral sciatica Continue back stretching strengthening exercises.  3. Scoliosis (and kyphoscoliosis), idiopathic As above  4. Chronic, continuous use of opioids We have reviewed the [facility] practitioner database information and it is appropriate.  5. Facet arthritis of lumbosacral region ([place])   6. Musculoskeletal back pain     ----------------------------------------------------------------------------------------------------------------------  I have changed Tami Hopkins's oxyCODONE-acetaminophen. I am also having her maintain her diphenoxylate-atropine, montelukast, furosemide, fluticasone, PROAIR HFA, ZETIA, KLOR-CON 10, promethazine, FLOVENT HFA, risperiDONE, zolpidem, clonazePAM, omeprazole, traZODone, traMADol, HYDROcodone-acetaminophen, and baclofen.   Meds ordered this encounter  Medications  . DISCONTD: oxyCODONE-acetaminophen (PERCOCET) 5-325 MG tablet    Sig: Take 1 tablet by mouth 2 (two) times daily.    Dispense:  60 tablet    Refill:  0  . oxyCODONE-acetaminophen (PERCOCET) 5-325 MG tablet     Sig: Take 1 tablet by mouth 2 (two) times daily. Do not take after 7 pm    Dispense:  60 tablet    Refill:  0   Patient's Medications  New Prescriptions   OXYCODONE-ACETAMINOPHEN (PERCOCET) 5-325 MG TABLET    Take 1 tablet by mouth 2 (two) times daily. Do not take after 7 pm  Previous Medications   BACLOFEN (LIORESAL) 10 MG TABLET    Take 1 tablet (10 mg total) by mouth 3 (three) times daily.   CLONAZEPAM (KLONOPIN) 0.5 MG TABLET    Take 0.25 mg by mouth daily.    DIPHENOXYLATE-ATROPINE (LOMOTIL) 2.5-0.025 MG PER TABLET    Take 1 tablet by mouth 4 (four) times daily as needed for diarrhea or loose stools.   FLOVENT HFA 110 MCG/ACT INHALER    Inhale 2 puffs into the lungs at bedtime.    FLUTICASONE (FLONASE) 50 MCG/ACT NASAL SPRAY    Place 1 spray into both nostrils 2 (two) times daily.   FUROSEMIDE (LASIX) 40 MG TABLET    Take 1 tablet by mouth as needed.    HYDROCODONE-ACETAMINOPHEN (NORCO/VICODIN) 5-325 MG TABLET    Take 1 tablet by mouth 2 (two) times daily.   KLOR-CON 10 10 MEQ TABLET    Take 10 mEq by mouth as needed.    MONTELUKAST (SINGULAIR) 10 MG TABLET    Take 10 mg by mouth at bedtime.   OMEPRAZOLE (PRILOSEC) 40 MG CAPSULE    Take 1 capsule by mouth as needed.   PROAIR HFA 108 (90 BASE) MCG/ACT INHALER    Inhale 2 puffs into the lungs every 4 (four) hours as needed.   PROMETHAZINE (PHENERGAN) 25 MG TABLET    Take 50 mg by mouth as needed.    RISPERIDONE (RISPERDAL) 1 MG TABLET    Take 1 tablet (1 mg total) by mouth at bedtime.   TRAMADOL (ULTRAM) 50 MG TABLET    Take 2 tablets (100 mg total) by mouth 4 (four) times daily.   TRAZODONE (DESYREL) 50 MG TABLET    Take 1 tablet by mouth daily.   ZETIA 10 MG TABLET    Take 10 mg by mouth daily.   ZOLPIDEM (AMBIEN) 10 MG TABLET    Take 10 mg by mouth at bedtime as needed for sleep.  Modified Medications   No medications on file  Discontinued Medications   No medications on file    ----------------------------------------------------------------------------------------------------------------------  Follow-up: Return in about 1 month (around 02/02/2018) for evaluation, med refill.    Molli Barrows, MD

## 2018-01-02 NOTE — Progress Notes (Signed)
Nursing Pain Medication Assessment:  Safety precautions to be maintained throughout the outpatient stay will include: orient to surroundings, keep bed in low position, maintain call bell within reach at all times, provide assistance with transfer out of bed and ambulation.  Medication Inspection Compliance: Pill count conducted under aseptic conditions, in front of the patient. Neither the pills nor the bottle was removed from the patient's sight at any time. Once count was completed pills were immediately returned to the patient in their original bottle.  Medication: Hydrocodone/APAP Pill/Patch Count: 42 of 60 pills remain Pill/Patch Appearance: Markings consistent with prescribed medication Bottle Appearance: Standard pharmacy container. Clearly labeled. Filled Date: 02 / 28 / 2019 Last Medication intake:  Day before yesterday   Wasted #42 Hydrocodone in the toilet, witness by Angelique Holm, RN and by patient.

## 2018-01-02 NOTE — Patient Instructions (Signed)
You were given one prescription for Percocet today. 

## 2018-01-08 ENCOUNTER — Ambulatory Visit: Payer: Medicare Other | Admitting: Anesthesiology

## 2018-01-13 ENCOUNTER — Ambulatory Visit: Payer: Medicare Other | Admitting: Anesthesiology

## 2018-01-17 ENCOUNTER — Other Ambulatory Visit: Payer: Self-pay | Admitting: Medical Oncology

## 2018-01-17 ENCOUNTER — Ambulatory Visit
Admission: RE | Admit: 2018-01-17 | Discharge: 2018-01-17 | Disposition: A | Discharge status: Home / Self Care | Payer: Medicare Other | Source: Ambulatory Visit | Attending: Medical Oncology | Admitting: Medical Oncology

## 2018-01-17 DIAGNOSIS — R05 Cough: Secondary | ICD-10-CM | POA: Diagnosis not present

## 2018-01-17 DIAGNOSIS — R053 Chronic cough: Secondary | ICD-10-CM

## 2018-01-22 ENCOUNTER — Encounter: Payer: Medicare Other | Admitting: Anesthesiology

## 2018-01-22 ENCOUNTER — Telehealth: Payer: Self-pay | Admitting: *Deleted

## 2018-01-29 ENCOUNTER — Ambulatory Visit: Payer: Medicare Other | Attending: Nurse Practitioner | Admitting: Nurse Practitioner

## 2018-01-29 ENCOUNTER — Other Ambulatory Visit: Payer: Self-pay

## 2018-01-29 ENCOUNTER — Encounter: Payer: Self-pay | Admitting: Nurse Practitioner

## 2018-01-29 VITALS — BP 111/87 | HR 83 | Temp 98.4°F | Resp 16 | Ht 67.0 in | Wt 156.0 lb

## 2018-01-29 DIAGNOSIS — F489 Nonpsychotic mental disorder, unspecified: Secondary | ICD-10-CM | POA: Insufficient documentation

## 2018-01-29 DIAGNOSIS — J449 Chronic obstructive pulmonary disease, unspecified: Secondary | ICD-10-CM | POA: Insufficient documentation

## 2018-01-29 DIAGNOSIS — G9340 Encephalopathy, unspecified: Secondary | ICD-10-CM | POA: Insufficient documentation

## 2018-01-29 DIAGNOSIS — Z79899 Other long term (current) drug therapy: Secondary | ICD-10-CM | POA: Diagnosis not present

## 2018-01-29 DIAGNOSIS — F5105 Insomnia due to other mental disorder: Secondary | ICD-10-CM | POA: Diagnosis not present

## 2018-01-29 DIAGNOSIS — M5441 Lumbago with sciatica, right side: Secondary | ICD-10-CM | POA: Insufficient documentation

## 2018-01-29 DIAGNOSIS — M6530 Trigger finger, unspecified finger: Secondary | ICD-10-CM | POA: Diagnosis not present

## 2018-01-29 DIAGNOSIS — G8929 Other chronic pain: Secondary | ICD-10-CM

## 2018-01-29 DIAGNOSIS — M545 Low back pain: Secondary | ICD-10-CM | POA: Diagnosis present

## 2018-01-29 DIAGNOSIS — Z6825 Body mass index (BMI) 25.0-25.9, adult: Secondary | ICD-10-CM | POA: Diagnosis not present

## 2018-01-29 DIAGNOSIS — R05 Cough: Secondary | ICD-10-CM | POA: Diagnosis present

## 2018-01-29 DIAGNOSIS — F4310 Post-traumatic stress disorder, unspecified: Secondary | ICD-10-CM | POA: Insufficient documentation

## 2018-01-29 DIAGNOSIS — J4540 Moderate persistent asthma, uncomplicated: Secondary | ICD-10-CM | POA: Insufficient documentation

## 2018-01-29 DIAGNOSIS — F17210 Nicotine dependence, cigarettes, uncomplicated: Secondary | ICD-10-CM | POA: Insufficient documentation

## 2018-01-29 DIAGNOSIS — R197 Diarrhea, unspecified: Secondary | ICD-10-CM | POA: Insufficient documentation

## 2018-01-29 DIAGNOSIS — F419 Anxiety disorder, unspecified: Secondary | ICD-10-CM | POA: Insufficient documentation

## 2018-01-29 DIAGNOSIS — G894 Chronic pain syndrome: Secondary | ICD-10-CM | POA: Insufficient documentation

## 2018-01-29 DIAGNOSIS — K210 Gastro-esophageal reflux disease with esophagitis: Secondary | ICD-10-CM | POA: Insufficient documentation

## 2018-01-29 DIAGNOSIS — M6282 Rhabdomyolysis: Secondary | ICD-10-CM | POA: Insufficient documentation

## 2018-01-29 DIAGNOSIS — K582 Mixed irritable bowel syndrome: Secondary | ICD-10-CM | POA: Diagnosis not present

## 2018-01-29 DIAGNOSIS — Q675 Congenital deformity of spine: Secondary | ICD-10-CM | POA: Diagnosis not present

## 2018-01-29 DIAGNOSIS — E7800 Pure hypercholesterolemia, unspecified: Secondary | ICD-10-CM | POA: Insufficient documentation

## 2018-01-29 DIAGNOSIS — M25551 Pain in right hip: Secondary | ICD-10-CM | POA: Diagnosis not present

## 2018-01-29 MED ORDER — BACLOFEN 10 MG PO TABS
10.0000 mg | ORAL_TABLET | Freq: Three times a day (TID) | ORAL | 1 refills | Status: DC
Start: 2018-01-29 — End: 2018-04-09

## 2018-01-29 MED ORDER — OXYCODONE-ACETAMINOPHEN 5-325 MG PO TABS: 1.0000 | ORAL_TABLET | Freq: Two times a day (BID) | ORAL | 0 refills | Status: DC

## 2018-01-29 MED ORDER — OXYCODONE-ACETAMINOPHEN 5-325 MG PO TABS: 1.0000 | ORAL_TABLET | Freq: Two times a day (BID) | ORAL | 0 refills | Status: AC

## 2018-01-29 NOTE — Patient Instructions (Addendum)
____________________________________________________________________________________________  Medication Rules  Applies to: All patients receiving prescriptions (written or electronic).  Pharmacy of record: Pharmacy where electronic prescriptions will be sent. If written prescriptions are taken to a different pharmacy, please inform the nursing staff. The pharmacy listed in the electronic medical record should be the one where you would like electronic prescriptions to be sent.  Prescription refills: Only during scheduled appointments. Applies to both, written and electronic prescriptions.  NOTE: The following applies primarily to controlled substances (Opioid* Pain Medications).   Patient's responsibilities: 1. Pain Pills: Bring all pain pills to every appointment (except for procedure appointments). 2. Pill Bottles: Bring pills in original pharmacy bottle. Always bring newest bottle. Bring bottle, even if empty. 3. Medication refills: You are responsible for knowing and keeping track of what medications you need refilled. The day before your appointment, write a list of all prescriptions that need to be refilled. Bring that list to your appointment and give it to the admitting nurse. Prescriptions will be written only during appointments. If you forget a medication, it will not be "Called in", "Faxed", or "electronically sent". You will need to get another appointment to get these prescribed. 4. Prescription Accuracy: You are responsible for carefully inspecting your prescriptions before leaving our office. Have the discharge nurse carefully go over each prescription with you, before taking them home. Make sure that your name is accurately spelled, that your address is correct. Check the name and dose of your medication to make sure it is accurate. Check the number of pills, and the written instructions to make sure they are clear and accurate. Make sure that you are given enough medication to last  until your next medication refill appointment. 5. Taking Medication: Take medication as prescribed. Never take more pills than instructed. Never take medication more frequently than prescribed. Taking less pills or less frequently is permitted and encouraged, when it comes to controlled substances (written prescriptions).  6. Inform other Doctors: Always inform, all of your healthcare providers, of all the medications you take. 7. Pain Medication from other Providers: You are not allowed to accept any additional pain medication from any other Doctor or Healthcare provider. There are two exceptions to this rule. (see below) In the event that you require additional pain medication, you are responsible for notifying us, as stated below. 8. Medication Agreement: You are responsible for carefully reading and following our Medication Agreement. This must be signed before receiving any prescriptions from our practice. Safely store a copy of your signed Agreement. Violations to the Agreement will result in no further prescriptions. (Additional copies of our Medication Agreement are available upon request.) 9. Laws, Rules, & Regulations: All patients are expected to follow all Federal and State Laws, Statutes, Rules, & Regulations. Ignorance of the Laws does not constitute a valid excuse. The use of any illegal substances is prohibited. 10. Adopted CDC guidelines & recommendations: Target dosing levels will be at or below 60 MME/day. Use of benzodiazepines** is not recommended.  Exceptions: There are only two exceptions to the rule of not receiving pain medications from other Healthcare Providers. 1. Exception #1 (Emergencies): In the event of an emergency (i.e.: accident requiring emergency care), you are allowed to receive additional pain medication. However, you are responsible for: As soon as you are able, call our office [phone], at any time of the day or night, and leave a message stating your name, the  date and nature of the emergency, and the name and dose of the medication   prescribed. In the event that your call is answered by a member of our staff, make sure to document and save the date, time, and the name of the person that took your information.  2. Exception #2 (Planned Surgery): In the event that you are scheduled by another doctor or dentist to have any type of surgery or procedure, you are allowed (for a period no longer than 30 days), to receive additional pain medication, for the acute post-op pain. However, in this case, you are responsible for picking up a copy of our "Post-op Pain Management for Surgeons" handout, and giving it to your surgeon or dentist. This document is available at our office, and does not require an appointment to obtain it. Simply go to our office during business hours (Monday-Thursday from 8:00 AM to 4:00 PM) (Friday 8:00 AM to 12:00 Noon) or if you have a scheduled appointment with [place], prior to your surgery, and ask for it by name. In addition, you will need to provide [place] with your name, name of your surgeon, type of surgery, and date of procedure or surgery.  *Opioid medications include: morphine, codeine, oxycodone, oxymorphone, hydrocodone, hydromorphone, meperidine, tramadol, tapentadol, buprenorphine, fentanyl, methadone. **Benzodiazepine medications include: diazepam (Valium), alprazolam (Xanax), clonazepam (Klonopine), lorazepam (Ativan), clorazepate (Tranxene), chlordiazepoxide (Librium), estazolam (Prosom), oxazepam (Serax), temazepam (Restoril), triazolam (Halcion) (Last updated: 12/19/2017) ____________________________________________________________________________________________  A prescription for Baclofen was sent to your pharmacy. You were given 2 prescriptions for Percocet.

## 2018-01-29 NOTE — Progress Notes (Signed)
Nursing Pain Medication Assessment:  Safety precautions to be maintained throughout the outpatient stay will include: orient to surroundings, keep bed in low position, maintain call bell within reach at all times, provide assistance with transfer out of bed and ambulation.  Medication Inspection Compliance: Pill count conducted under aseptic conditions, in front of the patient. Neither the pills nor the bottle was removed from the patient's sight at any time. Once count was completed pills were immediately returned to the patient in their original bottle.  Medication: Oxycodone/APAP Pill/Patch Count: 14 of 60 pills remain Pill/Patch Appearance: Markings consistent with prescribed medication Bottle Appearance: Standard pharmacy container. Clearly labeled. Filled Date:03 / 14/ 2019 Last Medication intake:  Today

## 2018-01-29 NOTE — Progress Notes (Signed)
Patient's Name: Tami Hopkins  MRN: [id number]  Referring Provider: Barbaraann Boys, MD  DOB: June 04, 1961  PCP: Barbaraann Boys, MD  DOS: 01/29/2018  Note by: Vevelyn Francois NP  Service setting: Ambulatory outpatient  Specialty: Interventional Pain Management  Location: [facility] (AMB) Pain Management Facility    Patient type: Established    Primary Reason(s) for Visit: Encounter for prescription drug management. (Level of risk: moderate)  CC: Back Pain  HPI  Tami Hopkins is a 57 y.o. year old, female patient, who comes today for a medication management evaluation. She has Rhabdomyolysis; Encephalopathy acute; Delirium, drug-induced ([place]); Posttraumatic stress disorder; Chronic obstructive pulmonary disease ([place]); Endometriosis; 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); Neurosis, posttraumatic; Chronic allergic rhinitis due to animal hair and dander; Chronic hip pain, right; Chronic nausea; Chronic right-sided low back pain with right-sided sciatica; Congenital scoliosis; Gastroesophageal reflux disease with esophagitis; High cholesterol; Irritable bowel syndrome with both constipation and diarrhea; Moderate persistent asthma without complication; Overweight (BMI 25.0-29.9); Tobacco use disorder; Trigger finger of right hand; Post-traumatic stress disorder; and Chronic pain syndrome on their problem list. Her primarily concern today is the Back Pain  Pain Assessment: Location: Lower, Mid Back Radiating: radiates down both legs in back , left lower back radiates around to groin Onset: More than a month ago Duration: Chronic pain Quality: Spasm, Radiating Severity: 5 /10 (self-reported pain score)  Note: Reported level is compatible with observation.                          Timing: Constant Modifying factors: resting flat  Tami Hopkins was last scheduled for an appointment on Visit date not found for medication management. During today's appointment we reviewed Tami Hopkins's chronic pain  status, as well as her outpatient medication regimen. She denies numbness, tingling or weakness. Then she further elaborates once in a while she has weakness in her legs.   The patient  reports that she does not use drugs. Her body mass index is 24.43 kg/m.  Further details on both, my assessment(s), as well as the proposed treatment plan, please see below.  Controlled Substance Pharmacotherapy Assessment REMS (Risk Evaluation and Mitigation Strategy)   [name], RN  01/29/2018  1:44 PM  Signed Nursing Pain Medication Assessment:  Safety precautions to be maintained throughout the outpatient stay will include: orient to surroundings, keep bed in low position, maintain call bell within reach at all times, provide assistance with transfer out of bed and ambulation.  Medication Inspection Compliance: Pill count conducted under aseptic conditions, in front of the patient. Neither the pills nor the bottle was removed from the patient's sight at any time. Once count was completed pills were immediately returned to the patient in their original bottle.  Medication: Oxycodone/APAP Pill/Patch Count: 14 of 60 pills remain Pill/Patch Appearance: Markings consistent with prescribed medication Bottle Appearance: Standard pharmacy container. Clearly labeled. Filled Date:03 / 14/ 2019 Last Medication intake:  Today   Pharmacokinetics: Liberation and absorption (onset of action): WNL Distribution (time to peak effect): WNL Metabolism and excretion (duration of action): WNL         Pharmacodynamics: Desired effects: Analgesia: Tami Hopkins reports >50% benefit. Functional ability: Patient reports that medication allows her to accomplish basic ADLs Clinically meaningful improvement in function (CMIF): Sustained CMIF goals met Perceived effectiveness: Described as relatively effective, allowing for increase in activities of daily living (ADL) Undesirable effects: Side-effects or Adverse reactions:  None reported Monitoring: [employer] P[place]: Online  review of the past 29-monthperiod conducted. Compliant with practice rules and regulations Last UDS on record: Summary  Date Value Ref Range Status  12/19/2017 FINAL  Final    Comment:    ==================================================================== TOXASSURE SELECT 13 (MW) ==================================================================== Test                             Result       Flag       Units Drug Present and Declared for Prescription Verification   7-aminoclonazepam              136          EXPECTED   ng/mg creat    7-aminoclonazepam is an expected metabolite of clonazepam. Source    of clonazepam is a scheduled prescription medication.   Tramadol                       14916        EXPECTED   ng/mg creat   O-Desmethyltramadol            17512        EXPECTED   ng/mg creat   N-Desmethyltramadol            18688        EXPECTED   ng/mg creat    Source of tramadol is a prescription medication.    O-desmethyltramadol and N-desmethyltramadol are expected    metabolites of tramadol. Drug Present not Declared for Prescription Verification   Alprazolam                     104          UNEXPECTED ng/mg creat   Alpha-hydroxyalprazolam        268          UNEXPECTED ng/mg creat    Source of alprazolam is a scheduled prescription medication.    Alpha-hydroxyalprazolam is an expected metabolite of alprazolam. Drug Absent but Declared for Prescription Verification   Hydrocodone                    Not Detected UNEXPECTED ng/mg creat ==================================================================== Test                      Result    Flag   Units      Ref Range   Creatinine              25               mg/dL      >=20 ==================================================================== Declared Medications:  The flagging and interpretation on this report are based on the  following declared medications.  Unexpected results may arise  from  inaccuracies in the declared medications.  **Note: The testing scope of this panel includes these medications:  Clonazepam (Klonopin)  Hydrocodone (Norco)  Tramadol (Ultram)  **Note: The testing scope of this panel does not include following  reported medications:  Acetaminophen (Norco)  Albuterol  Atropine (Lomotil)  Baclofen (Lioresal)  Diphenoxylate (Lomotil)  Ezetimibe (Zetia)  Fluticasone (Flonase)  Fluticasone (Flovent)  Furosemide (Lasix)  Montelukast (Singulair)  Omeprazole (Prilosec)  Potassium (Klor-Con)  Promethazine (Phenergan)  Risperidone (Risperdal)  Trazodone (Desyrel)  Zolpidem (Ambien) ==================================================================== For clinical consultation, please call [phone] ====================================================================    UDS interpretation: Compliant          Medication Assessment Form: Reviewed.  Patient indicates being compliant with therapy Treatment compliance: Compliant Risk Assessment Profile: Aberrant behavior: See prior evaluations. None observed or detected today Comorbid factors increasing risk of overdose: See prior notes. No additional risks detected today Risk of substance use disorder (SUD): Low  ORT Scoring interpretation table:  Score <3 = Low Risk for SUD  Score between 4-7 = Moderate Risk for SUD  Score >8 = High Risk for Opioid Abuse   Risk Mitigation Strategies:  Patient Counseling: Covered Patient-Prescriber Agreement (PPA): Present and active  Notification to other healthcare providers: Done  Pharmacologic Plan: No change in therapy, at this time.             Laboratory Chemistry  Inflammation Markers (CRP: Acute Phase) (ESR: Chronic Phase) Lab Results  Component Value Date   LATICACIDVEN 1.1 05/11/2015                         Rheumatology Markers No results found for: RF, ANA, Therisa Doyne, [facility]                      Renal Function  Markers Lab Results  Component Value Date   BUN 11 05/14/2015   CREATININE 0.72 05/14/2015   GFRAA >60 05/14/2015   GFRNONAA >60 05/14/2015                              Hepatic Function Markers Lab Results  Component Value Date   AST 170 (H) 05/14/2015   ALT 148 (H) 05/14/2015   ALBUMIN 3.3 (L) 05/14/2015   ALKPHOS 70 05/14/2015   AMMONIA 12 05/11/2015                        Electrolytes Lab Results  Component Value Date   NA 138 05/14/2015   K 3.7 05/14/2015   CL 105 05/14/2015   CALCIUM 8.6 (L) 05/14/2015   MG 2.0 05/11/2015                        Neuropathy Markers No results found for: VITAMINB12, FOLATE, HGBA1C, HIV                      Bone Pathology Markers No results found for: VD25OH, VD125OH2TOT, [id number], [id number], 25OHVITD1, 25OHVITD2, 25OHVITD3, TESTOFREE, TESTOSTERONE                       Coagulation Parameters Lab Results  Component Value Date   PLT 223 05/11/2015                        Cardiovascular Markers Lab Results  Component Value Date   CKTOTAL 5,683 (H) 05/14/2015   TROPONINI <0.03 05/10/2015   HGB 12.1 05/11/2015   HCT 36.1 05/11/2015                         CA Markers No results found for: CEA, CA125, LABCA2                      Note: Lab results reviewed.  Recent Diagnostic Imaging Results  DG Chest 2 View CLINICAL DATA:  Nonproductive cough.  EXAM: CHEST - 2 VIEW  COMPARISON:  Radiographs of July 18, 2007.  FINDINGS: The heart size and mediastinal  contours are within normal limits. Both lungs are clear. No pneumothorax or pleural effusion is noted. The visualized skeletal structures are unremarkable.  IMPRESSION: No active cardiopulmonary disease.  Electronically Signed   By: Marijo Conception, M.D.   On: 01/17/2018 14:08  Complexity Note: Imaging results reviewed. Results shared with Ms. Beougher, using [employer].                         Meds   Current Outpatient Medications:  .  baclofen  (LIORESAL) 10 MG tablet, Take 1 tablet (10 mg total) by mouth 3 (three) times daily., Disp: 90 tablet, Rfl: 1 .  clonazePAM (KLONOPIN) 0.5 MG tablet, Take 0.25 mg by mouth daily. , Disp: , Rfl:  .  diphenoxylate-atropine (LOMOTIL) 2.5-0.025 MG per tablet, Take 1 tablet by mouth 4 (four) times daily as needed for diarrhea or loose stools., Disp: , Rfl:  .  FLOVENT HFA 110 MCG/ACT inhaler, Inhale 2 puffs into the lungs at bedtime. , Disp: , Rfl:  .  fluticasone (FLONASE) 50 MCG/ACT nasal spray, Place 1 spray into both nostrils 2 (two) times daily., Disp: , Rfl:  .  furosemide (LASIX) 40 MG tablet, Take 1 tablet by mouth as needed. , Disp: , Rfl:  .  KLOR-CON 10 10 MEQ tablet, Take 10 mEq by mouth as needed. , Disp: , Rfl: 5 .  montelukast (SINGULAIR) 10 MG tablet, Take 10 mg by mouth at bedtime., Disp: , Rfl:  .  omeprazole (PRILOSEC) 40 MG capsule, Take 1 capsule by mouth as needed., Disp: , Rfl:  .  [START ON 02/01/2018] oxyCODONE-acetaminophen (PERCOCET) 5-325 MG tablet, Take 1 tablet by mouth 2 (two) times daily. Do not take after 7 pm, Disp: 60 tablet, Rfl: 0 .  PROAIR HFA 108 (90 BASE) MCG/ACT inhaler, Inhale 2 puffs into the lungs every 4 (four) hours as needed., Disp: , Rfl:  .  promethazine (PHENERGAN) 25 MG tablet, Take 50 mg by mouth as needed. , Disp: , Rfl:  .  risperiDONE (RISPERDAL) 1 MG tablet, Take 1 tablet (1 mg total) by mouth at bedtime., Disp: 30 tablet, Rfl: 2 .  traZODone (DESYREL) 50 MG tablet, Take 1 tablet by mouth daily., Disp: , Rfl:  .  ZETIA 10 MG tablet, Take 10 mg by mouth daily., Disp: , Rfl: 5 .  zolpidem (AMBIEN) 10 MG tablet, Take 10 mg by mouth at bedtime as needed for sleep., Disp: , Rfl:  .  [START ON 03/03/2018] oxyCODONE-acetaminophen (PERCOCET) 5-325 MG tablet, Take 1 tablet by mouth 2 (two) times daily., Disp: 120 tablet, Rfl: 0  ROS  Constitutional: Denies any fever or chills Gastrointestinal: No reported hemesis, hematochezia, vomiting, or acute GI  distress Musculoskeletal: Denies any acute onset joint swelling, redness, loss of ROM, or weakness Neurological: No reported episodes of acute onset apraxia, aphasia, dysarthria, agnosia, amnesia, paralysis, loss of coordination, or loss of consciousness  Allergies  Ms. Samarin is allergic to penicillins; sulfa antibiotics; amitriptyline; bupropion; doxepin; moxifloxacin; and penicillin g.  PFSH  Drug: Ms. Mcmorris  reports that she does not use drugs. Alcohol:  reports that she does not drink alcohol. Tobacco:  reports that she has been smoking cigarettes.  She started smoking about 22 years ago. She has been smoking about 0.25 packs per day. She has never used smokeless tobacco. Medical:  has a past medical history of Anxiety, Asthma, Bursitis of hip bilateral, Hyperlipidemia, PTSD (post-traumatic stress disorder), and Scoliosis. Surgical: Ms.  Keadle  has a past surgical history that includes Cesarean section; Endometrial ablation; Abdominal hysterectomy; Nose surgery; and Cholecystectomy. Family: family history includes Alcohol abuse in her father; Arthritis in her mother; Asthma in her mother; Depression in her mother; Diabetes in her sister; Heart attack in her father; Hypertension in her sister; Obesity in her sister.  Constitutional Exam  General appearance: Well nourished, well developed, and well hydrated. In no apparent acute distress Vitals:   01/29/18 1336 01/29/18 1339  BP:  111/87  Pulse: 83   Resp: 16   Temp: 98.4 F (36.9 C)   SpO2: 97%   Weight: 156 lb (70.8 kg)   Height: 5' 7"  (1.702 m)    BMI Assessment: Estimated body mass index is 24.43 kg/m as calculated from the following:   Height as of this encounter: 5' 7"  (1.702 m).   Weight as of this encounter: 156 lb (70.8 kg). Psych/Mental status: Alert, oriented x 3 (person, place, & time)       Eyes: PERLA Respiratory: No evidence of acute respiratory distress  Thoracic Spine Area Exam  Skin & Axial Inspection:  No masses, redness, or swelling Alignment: Asymmetric Functional ROM: Unrestricted ROM Stability: No instability detected Muscle Tone/Strength: Functionally intact. No obvious neuro-muscular anomalies detected. Sensory (Neurological): Unimpaired Muscle strength & Tone: No palpable anomalies  Lumbar Spine Area Exam  Skin & Axial Inspection: No masses, redness, or swelling Alignment: Scoliosis detected Functional ROM: Unrestricted ROM      Stability: No instability detected Muscle Tone/Strength: Functionally intact. No obvious neuro-muscular anomalies detected. Sensory (Neurological): Unimpaired Palpation: Complains of area being tender to palpation       Provocative Tests: Lumbar Hyperextension and rotation test: Negative       Lumbar Lateral bending test: Negative       Patrick's Maneuver: evaluation deferred today                    Gait & Posture Assessment  Ambulation: Unassisted Gait: Relatively normal for age and body habitus Posture: WNL   Lower Extremity Exam    Side: Right lower extremity  Side: Left lower extremity  Skin & Extremity Inspection: Skin color, temperature, and hair growth are WNL. No peripheral edema or cyanosis. No masses, redness, swelling, asymmetry, or associated skin lesions. No contractures.  Skin & Extremity Inspection: Skin color, temperature, and hair growth are WNL. No peripheral edema or cyanosis. No masses, redness, swelling, asymmetry, or associated skin lesions. No contractures.  Functional ROM: Unrestricted ROM          Functional ROM: Unrestricted ROM          Muscle Tone/Strength: Functionally intact. No obvious neuro-muscular anomalies detected.  Muscle Tone/Strength: Functionally intact. No obvious neuro-muscular anomalies detected.  Sensory (Neurological): Unimpaired  Sensory (Neurological): Unimpaired  Palpation: No palpable anomalies  Palpation: No palpable anomalies   Assessment  Primary Diagnosis & Pertinent Problem List: The primary  encounter diagnosis was Congenital scoliosis. Diagnoses of Chronic right-sided low back pain with right-sided sciatica, Chronic hip pain, right, and Chronic pain syndrome were also pertinent to this visit.  Status Diagnosis  Controlled Controlled Controlled 1. Congenital scoliosis   2. Chronic right-sided low back pain with right-sided sciatica   3. Chronic hip pain, right   4. Chronic pain syndrome     Problems updated and reviewed during this visit: Problem  Chronic Pain Syndrome  Chronic Hip Pain, Right  Chronic Right-Sided Low Back Pain With Right-Sided Sciatica  Chronic Nausea  Overview:  Due to PTSD.  Has tried many other regimens and meds but Phenergan works best, uses about once/day   Gastroesophageal Reflux Disease With Esophagitis  Trigger Finger of Right Hand  Irritable Bowel Syndrome With Both Constipation and Diarrhea  Chronic Allergic Rhinitis Due to Animal Hair and Dander  Congenital Scoliosis  High Cholesterol  Moderate Persistent Asthma Without Complication  Overweight (Bmi 25.0-29.9)  Tobacco Use Disorder  Insomnia Due to Other Mental Disorder ([place])  Post-Traumatic Stress Disorder   Plan of Care  Pharmacotherapy (Medications Ordered): Meds ordered this encounter  Medications  . oxyCODONE-acetaminophen (PERCOCET) 5-325 MG tablet    Sig: Take 1 tablet by mouth 2 (two) times daily. Do not take after 7 pm    Dispense:  60 tablet    Refill:  0    Do not fill until:02/01/2018 to last until: 03/03/2018    Order Specific Question:   Supervising Provider    Answer:   Milinda Pointer [phone]  . baclofen (LIORESAL) 10 MG tablet    Sig: Take 1 tablet (10 mg total) by mouth 3 (three) times daily.    Dispense:  90 tablet    Refill:  1    Order Specific Question:   Supervising Provider    Answer:   Milinda Pointer [phone]  . oxyCODONE-acetaminophen (PERCOCET) 5-325 MG tablet    Sig: Take 1 tablet by mouth 2 (two) times daily.    Dispense:  120 tablet     Refill:  0    Do not place this medication, or any other prescription from our practice, on "Automatic Refill". Patient may have prescription filled one day early if pharmacy is closed on scheduled refill date. Do not fill until: 03/03/2018 To last until:04/02/2018    Order Specific Question:   Supervising Provider    Answer:   Molli Barrows [[id number]]   New Prescriptions   OXYCODONE-ACETAMINOPHEN (PERCOCET) 5-325 MG TABLET    Take 1 tablet by mouth 2 (two) times daily.   Medications administered today: [name] had no medications administered during this visit. Lab-work, procedure(s), and/or referral(s): No orders of the defined types were placed in this encounter.  Imaging and/or referral(s): None  Interventional therapies: Planned, scheduled, and/or pending:   Not at this time.    Provider-requested follow-up: Return in about 2 months (around 03/31/2018).  Future Appointments  Date Time Provider [place]  02/07/2018 12:00 PM [facility]  03/31/2018 11:30 AM Vevelyn Francois, NP [facility] ([facility] None   Primary Care Physician: Barbaraann Boys, MD Location: [facility] Note by: Vevelyn Francois NP Date: 01/29/2018; Time: 5:51 PM  Pain Score Disclaimer: We use the NRS-11 scale. This is a self-reported, subjective measurement of pain severity with only modest accuracy. It is used primarily to identify changes within a particular patient. It must be understood that outpatient pain scales are significantly less accurate that those used for research, where they can be applied under ideal controlled circumstances with minimal exposure to variables. In reality, the score is likely to be a combination of pain intensity and pain affect, where pain affect describes the degree of emotional arousal or changes in action readiness caused by the sensory experience of pain. Factors such as social and work situation, setting, emotional state, anxiety levels,  expectation, and prior pain experience may influence pain perception and show large inter-individual differences that may also be affected by time variables.  Patient instructions provided during this appointment: Patient Instructions  ____________________________________________________________________________________________  Medication Rules  Applies to: All patients receiving prescriptions (written or electronic).  Pharmacy of record: Pharmacy where electronic prescriptions will be sent. If written prescriptions are taken to a different pharmacy, please inform the nursing staff. The pharmacy listed in the electronic medical record should be the one where you would like electronic prescriptions to be sent.  Prescription refills: Only during scheduled appointments. Applies to both, written and electronic prescriptions.  NOTE: The following applies primarily to controlled substances (Opioid* Pain Medications).   Patient's responsibilities: 1. Pain Pills: Bring all pain pills to every appointment (except for procedure appointments). 2. Pill Bottles: Bring pills in original pharmacy bottle. Always bring newest bottle. Bring bottle, even if empty. 3. Medication refills: You are responsible for knowing and keeping track of what medications you need refilled. The day before your appointment, write a list of all prescriptions that need to be refilled. Bring that list to your appointment and give it to the admitting nurse. Prescriptions will be written only during appointments. If you forget a medication, it will not be "Called in", "Faxed", or "electronically sent". You will need to get another appointment to get these prescribed. 4. Prescription Accuracy: You are responsible for carefully inspecting your prescriptions before leaving our office. Have the discharge nurse carefully go over each prescription with you, before taking them home. Make sure that your name is accurately spelled, that your  address is correct. Check the name and dose of your medication to make sure it is accurate. Check the number of pills, and the written instructions to make sure they are clear and accurate. Make sure that you are given enough medication to last until your next medication refill appointment. 5. Taking Medication: Take medication as prescribed. Never take more pills than instructed. Never take medication more frequently than prescribed. Taking less pills or less frequently is permitted and encouraged, when it comes to controlled substances (written prescriptions).  6. Inform other Doctors: Always inform, all of your healthcare providers, of all the medications you take. 7. Pain Medication from other Providers: You are not allowed to accept any additional pain medication from any other Doctor or Healthcare provider. There are two exceptions to this rule. (see below) In the event that you require additional pain medication, you are responsible for notifying us, as stated below. 8. Medication Agreement: You are responsible for carefully reading and following our Medication Agreement. This must be signed before receiving any prescriptions from our practice. Safely store a copy of your signed Agreement. Violations to the Agreement will result in no further prescriptions. (Additional copies of our Medication Agreement are available upon request.) 9. Laws, Rules, & Regulations: All patients are expected to follow all Federal and [employer], [employer], Rules, [employer]. Ignorance of the Laws does not constitute a valid excuse. The use of any illegal substances is prohibited. 10. Adopted CDC guidelines & recommendations: Target dosing levels will be at or below 60 MME/day. Use of benzodiazepines** is not recommended.  Exceptions: There are only two exceptions to the rule of not receiving pain medications from other Healthcare Providers. 1. Exception #1 (Emergencies): In the event of an emergency (i.e.: accident  requiring emergency care), you are allowed to receive additional pain medication. However, you are responsible for: As soon as you are able, call our office [phone], at any time of the day or night, and leave a message stating your name, the date and nature of the emergency, and the name and dose of the medication prescribed. In  the event that your call is answered by a member of our staff, make sure to document and save the date, time, and the name of the person that took your information.  2. Exception #2 (Planned Surgery): In the event that you are scheduled by another doctor or dentist to have any type of surgery or procedure, you are allowed (for a period no longer than 30 days), to receive additional pain medication, for the acute post-op pain. However, in this case, you are responsible for picking up a copy of our "Post-op Pain Management for Surgeons" handout, and giving it to your surgeon or dentist. This document is available at our office, and does not require an appointment to obtain it. Simply go to our office during business hours (Monday-Thursday from 8:00 AM to 4:00 PM) (Friday 8:00 AM to 12:00 Noon) or if you have a scheduled appointment with [place], prior to your surgery, and ask for it by name. In addition, you will need to provide [place] with your name, name of your surgeon, type of surgery, and date of procedure or surgery.  *Opioid medications include: morphine, codeine, oxycodone, oxymorphone, hydrocodone, hydromorphone, meperidine, tramadol, tapentadol, buprenorphine, fentanyl, methadone. **Benzodiazepine medications include: diazepam (Valium), alprazolam (Xanax), clonazepam (Klonopine), lorazepam (Ativan), clorazepate (Tranxene), chlordiazepoxide (Librium), estazolam (Prosom), oxazepam (Serax), temazepam (Restoril), triazolam (Halcion) (Last updated: 12/19/2017) ____________________________________________________________________________________________  A prescription for Baclofen  was sent to your pharmacy. You were given 2 prescriptions for Percocet.

## 2018-02-19 ENCOUNTER — Ambulatory Visit
Admission: RE | Admit: 2018-02-19 | Discharge: 2018-02-19 | Disposition: A | Discharge status: Home / Self Care | Payer: Medicare Other | Source: Ambulatory Visit | Attending: Pediatrics | Admitting: Pediatrics

## 2018-02-19 DIAGNOSIS — Z1231 Encounter for screening mammogram for malignant neoplasm of breast: Secondary | ICD-10-CM | POA: Insufficient documentation

## 2018-02-22 ENCOUNTER — Other Ambulatory Visit: Payer: Self-pay | Admitting: Internal Medicine

## 2018-02-24 NOTE — Telephone Encounter (Signed)
Last appt 12-17

## 2018-02-25 NOTE — Telephone Encounter (Signed)
No longer under my care

## 2018-02-26 ENCOUNTER — Other Ambulatory Visit: Payer: Self-pay | Admitting: Internal Medicine

## 2018-03-22 ENCOUNTER — Other Ambulatory Visit: Payer: Self-pay | Admitting: Internal Medicine

## 2018-03-24 ENCOUNTER — Encounter: Payer: Self-pay | Admitting: Obstetrics and Gynecology

## 2018-03-24 ENCOUNTER — Ambulatory Visit (INDEPENDENT_AMBULATORY_CARE_PROVIDER_SITE_OTHER): Payer: Medicare Other | Admitting: Obstetrics and Gynecology

## 2018-03-24 VITALS — BP 120/80 | Ht 67.0 in | Wt 157.0 lb

## 2018-03-24 DIAGNOSIS — N809 Endometriosis, unspecified: Secondary | ICD-10-CM

## 2018-03-24 DIAGNOSIS — R1031 Right lower quadrant pain: Secondary | ICD-10-CM

## 2018-03-24 MED ORDER — ESTRADIOL 0.1 MG/GM VA CREA: TOPICAL_CREAM | VAGINAL | 0 refills | Status: DC
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
## 2018-03-25 LAB — ESTRADIOL: Estradiol: <5 pg/mL

## 2018-03-25 LAB — FOLLICLE STIMULATING HORMONE: FSH: 104.9 m[IU]/mL

## 2018-03-25 NOTE — Telephone Encounter (Signed)
Pt received MyChart msg and left msg on triage stating she was concerned about the lab result for her estrogen being very low. Pt wants to know if she should be worried prior to her appt and ultrasound next week. Please advise via MyChart or cb# [phone]. Thank you.

## 2018-03-25 NOTE — Telephone Encounter (Signed)
No it should be low she's menopausal

## 2018-03-25 NOTE — Telephone Encounter (Signed)
Pt aware and appreciative  

## 2018-03-31 ENCOUNTER — Telehealth: Payer: Self-pay | Admitting: *Deleted

## 2018-03-31 ENCOUNTER — Encounter: Payer: Medicare Other | Admitting: Nurse Practitioner

## 2018-03-31 NOTE — Telephone Encounter (Signed)
Patient advised she should keep her appointment.

## 2018-03-31 NOTE — Telephone Encounter (Signed)
She is a pt of Dr [name] . He requires 2 month Fu. I refilled based on this.  She needs to follow as scheduled.

## 2018-04-01 ENCOUNTER — Ambulatory Visit (INDEPENDENT_AMBULATORY_CARE_PROVIDER_SITE_OTHER): Payer: Medicare Other | Admitting: Obstetrics and Gynecology

## 2018-04-01 ENCOUNTER — Encounter: Payer: Self-pay | Admitting: Obstetrics and Gynecology

## 2018-04-01 ENCOUNTER — Ambulatory Visit (INDEPENDENT_AMBULATORY_CARE_PROVIDER_SITE_OTHER): Payer: Medicare Other

## 2018-04-01 VITALS — BP 112/78 | HR 88 | Ht 67.0 in | Wt 157.0 lb

## 2018-04-01 DIAGNOSIS — R1031 Right lower quadrant pain: Secondary | ICD-10-CM | POA: Diagnosis not present

## 2018-04-01 NOTE — Progress Notes (Signed)
Gynecology Ultrasound Follow Up  Chief Complaint:  Chief Complaint  Patient presents with  . Follow-up    GYN U/S     History of Present Illness: Patient is a 57 y.o. female who presents today for ultrasound evaluation of chronic pelvic pain.  Ultrasound demonstrates the following findgins Adnexa: no masses visualized Uterus: Surgically absent Additional: no free fluid  Review of Systems: Review of Systems  Constitutional: Negative.   Gastrointestinal: Positive for abdominal pain. Negative for blood in stool, constipation, diarrhea, melena, nausea and vomiting.    Past Medical History:  Past Medical History:  Diagnosis Date  . Anxiety   . Asthma   . Bursitis of hip bilateral   . Hyperlipidemia   . PTSD (post-traumatic stress disorder)   . Scoliosis     Past Surgical History:  Past Surgical History:  Procedure Laterality Date  . ABDOMINAL HYSTERECTOMY    . CESAREAN SECTION    . CHOLECYSTECTOMY    . ENDOMETRIAL ABLATION     x3  . NOSE SURGERY      Gynecologic History:  No LMP recorded. Patient has had a hysterectomy. Contraception: status post hysterectomy   Family History:  Family History  Problem Relation Age of Onset  . Asthma Mother   . Arthritis Mother   . Depression Mother   . Heart attack Father   . Alcohol abuse Father   . Hypertension Sister   . Diabetes Sister   . Obesity Sister   . Breast cancer Maternal Aunt        Great aunt  . Endometriosis Daughter   . Endometriosis Daughter   . Heart failure Neg Hx     Social History:  Social History   Socioeconomic History  . Marital status: Married    Spouse name: Not on file  . Number of children: Not on file  . Years of education: Not on file  . Highest education level: Not on file  Occupational History  . Not on file  Social Needs  . Financial resource strain: Not on file  . Food insecurity:    Worry: Not on file    Inability: Not on file  . Transportation needs:    Medical:  Not on file    Non-medical: Not on file  Tobacco Use  . Smoking status: Current Every Day Smoker    Packs/day: 0.25    Types: Cigarettes    Start date: 08/18/1995  . Smokeless tobacco: Never Used  Substance and Sexual Activity  . Alcohol use: No    Alcohol/week: 0.0 - 1.2 oz    Comment: rare  . Drug use: No  . Sexual activity: Yes    Birth control/protection: None  Lifestyle  . Physical activity:    Days per week: Not on file    Minutes per session: Not on file  . Stress: Not on file  Relationships  . Social connections:    Talks on phone: Not on file    Gets together: Not on file    Attends religious service: Not on file    Active member of club or organization: Not on file    Attends meetings of clubs or organizations: Not on file    Relationship status: Not on file  . Intimate partner violence:    Fear of current or ex partner: Not on file    Emotionally abused: Not on file    Physically abused: Not on file    Forced sexual activity: Not on file  Other Topics Concern  . Not on file  Social History Narrative  . Not on file    Allergies:  Allergies  Allergen Reactions  . Penicillins Anaphylaxis    Any "cillins"  . Sulfa Antibiotics Anaphylaxis  . Amitriptyline     Can not take with other medications  Can not take with other medications  Can not take with other medications   . Bupropion     Can not take with other combination medications  Can not take with other combination medications  Can not take with other combination medications   . Doxepin     Can not take with other medications  Can not take with other medications  Can not take with other medications   . Moxifloxacin Other (See Comments)  . Penicillin G Rash    ALL CILLINS    Medications: Prior to Admission medications   Medication Sig Start Date End Date Taking? Authorizing Provider  baclofen (LIORESAL) 10 MG tablet Take 1 tablet (10 mg total) by mouth 3 (three) times daily. 01/29/18 02/28/18   Vevelyn Francois, NP  clonazePAM (KLONOPIN) 0.5 MG tablet Take 0.25 mg by mouth daily.     [provider]  diphenoxylate-atropine (LOMOTIL) 2.5-0.025 MG per tablet Take 1 tablet by mouth 4 (four) times daily as needed for diarrhea or loose stools.    [provider]  estradiol (ESTRACE VAGINAL) 0.1 MG/GM vaginal cream 1 gram vaginally nightly at bedtime for 2 weeks,  then 1 gram vaginally twice weekly 03/24/18   Malachy Mood, MD  FLOVENT HFA 110 MCG/ACT inhaler Inhale 2 puffs into the lungs at bedtime.  11/26/15   [provider]  fluticasone (FLONASE) 50 MCG/ACT nasal spray Place 1 spray into both nostrils 2 (two) times daily.    [provider]  furosemide (LASIX) 40 MG tablet Take 1 tablet by mouth as needed.     [provider]  ipratropium (ATROVENT) 0.06 % nasal spray Place into the nose. 02/20/18 02/20/19  [provider]  KLOR-CON 10 10 MEQ tablet Take 10 mEq by mouth as needed.  07/02/15   [provider]  montelukast (SINGULAIR) 10 MG tablet Take 10 mg by mouth at bedtime.    [provider]  omeprazole (PRILOSEC) 40 MG capsule Take 1 capsule by mouth as needed. 05/21/17 05/21/18  [provider]  oxyCODONE-acetaminophen (PERCOCET) 5-325 MG tablet Take 1 tablet by mouth 2 (two) times daily. 03/03/18 04/02/18  Vevelyn Francois, NP  PROAIR HFA 108 (90 BASE) MCG/ACT inhaler Inhale 2 puffs into the lungs every 4 (four) hours as needed.    [provider]  promethazine (PHENERGAN) 25 MG tablet Take 50 mg by mouth as needed.  08/16/15   [provider]  risperiDONE (RISPERDAL) 1 MG tablet Take 1 tablet (1 mg total) by mouth at bedtime. Patient not taking: Reported on 03/24/2018 01/14/17   Rainey Pines, MD  traZODone (DESYREL) 50 MG tablet Take 1 tablet by mouth daily. 09/10/17 09/06/18  [provider]  ZETIA 10 MG tablet Take 10 mg by mouth daily. 08/02/15   [provider]  zolpidem  (AMBIEN) 10 MG tablet Take 10 mg by mouth at bedtime as needed for sleep.    [provider]    Physical Exam Vitals: Blood pressure 112/78, pulse 88, height 5\' 7"  (1.702 m), weight 157 lb (71.2 kg).  General: NAD HEENT: normocephalic, anicteric Pulmonary: No increased work of breathing Extremities: no edema, erythema, or tenderness Neurologic:  Grossly intact, normal gait Psychiatric: mood appropriate, affect full  US Transvaginal Non-ob  Result Date: 04/01/2018 Patient Name: Tami Hopkins DOB: 05/11/1961 MRN: [id number] ULTRASOUND REPORT Location: [facility] Date of Service: 04/01/2018 Indications:Rt lower Q pain Findings: The uterus is not seen (hysterectomy) Vaginal cuff is seen Right Ovary is not seen d/t filled bowel with hard stool Left Ovary is not seen (Lt oophorectomy) Survey of the adnexa demonstrates no adnexal masses. There is no free fluid in the cul de sac. Impression: 1. The uterus is  not seen (hysterectomy) Vaginal cuff is seen Right Ovary is not seen d/t filled bowel with hard stool Left Ovary is not seen (Lt oophorectomy) Recommendations: 1.Clinical correlation with the patient's History and Physical Exam. Abeer Alsammarraie RDMS  Images reviewed.  Normal GYN study without visualized pathology.    Malachy Mood, MD, [facility], [facility] 04/01/2018, 5:56 PM     Assessment: 57 y.o. [id number] chronic pelvic/abdominal pain Plan: Problem List Items Addressed This Visit    None    Visit Diagnoses    Right lower quadrant abdominal pain    -  Primary   Relevant Orders   CT ABDOMEN PELVIS W CONTRAST      1) Abdominal pain - no visualized gyn pathology.  Even though pain is reminiscent of prior bout of endometriosis laboratory evaluation clearly shows patient to be in menopause.  We discussed that there is regression not progression of endometriosis once menopause is reached.  I would continue premarin cream for vulvovaginal  atrophy and urethral prolapse.  Follow up in 3 months to assess response.  Will obtain CT abdomen and pelvis to rule out other etiologies.  There was a significant stool burden noted on ultrasound today, patient reports regular daily bowl movement.  She is on chronic opoid treatment and we discussed it implications in bowl motility.  Discussed Amitiza.  At present patient opts to increase fiber intake by adding benafiber to her daily regimen.  2) A total of 15 minutes were spent in face-to-face contact with the patient during this encounter with over half of that time devoted to counseling and coordination of care.  3) Return in about 3 months (around 07/02/2018) for medication follow up.   Malachy Mood, MD, [facility], [facility] 04/01/2018, 5:55 PM

## 2018-04-02 ENCOUNTER — Other Ambulatory Visit: Payer: Self-pay | Admitting: Obstetrics and Gynecology

## 2018-04-02 ENCOUNTER — Telehealth: Payer: Self-pay | Admitting: Obstetrics and Gynecology

## 2018-04-02 MED ORDER — LUBIPROSTONE 24 MCG PO CAPS: 24.0000 ug | ORAL_CAPSULE | Freq: Two times a day (BID) | ORAL | 3 refills | Status: DC

## 2018-04-02 NOTE — Telephone Encounter (Signed)
Please advise 

## 2018-04-02 NOTE — Telephone Encounter (Signed)
Has been sent.

## 2018-04-02 NOTE — Telephone Encounter (Signed)
Patient seen yesterday and declined Rx but now she would like sent in, cannot remember what med was called but was to help with bowels.  Preferred pharmacy [facility] [address].

## 2018-04-02 NOTE — Telephone Encounter (Signed)
Patient is aware she is scheduled for CT on Tuesday, 04/08/18 @ 8:00am at [facility], must arrive @ 7:45am at the [facility] registration desk, nothing to eat or drink 4 hours prior to test, must pick up a prep kit no later than Monday.  Patient said yesterday she was told she was impacted, Dr. Georgianne Fick offered medication which she declined, but has changed her mind and would like the medication called in. Patient wants Dr. Georgianne Fick to know what she has tried already: Last night 2 laxatives and 2 stool softeners. This morning she went to the bathroom, but very little. She as used two enemas this morning, drank an epsom salt solution, added benefiber to her coffee, and took anoter 2 laxatives and 3 stool softeners about 20 min ago. The patient said she will not go to the hospital for this, and wants to take care of this at home.

## 2018-04-04 ENCOUNTER — Encounter (INDEPENDENT_AMBULATORY_CARE_PROVIDER_SITE_OTHER): Payer: Self-pay

## 2018-04-08 ENCOUNTER — Ambulatory Visit
Admission: RE | Admit: 2018-04-08 | Discharge: 2018-04-08 | Disposition: A | Discharge status: Home / Self Care | Payer: Medicare Other | Source: Ambulatory Visit | Attending: Obstetrics and Gynecology | Admitting: Obstetrics and Gynecology

## 2018-04-08 DIAGNOSIS — N200 Calculus of kidney: Secondary | ICD-10-CM | POA: Insufficient documentation

## 2018-04-08 DIAGNOSIS — Z90710 Acquired absence of both cervix and uterus: Secondary | ICD-10-CM | POA: Diagnosis not present

## 2018-04-08 DIAGNOSIS — Z90721 Acquired absence of ovaries, unilateral: Secondary | ICD-10-CM | POA: Diagnosis not present

## 2018-04-08 DIAGNOSIS — Z9049 Acquired absence of other specified parts of digestive tract: Secondary | ICD-10-CM | POA: Diagnosis not present

## 2018-04-08 DIAGNOSIS — R1031 Right lower quadrant pain: Secondary | ICD-10-CM

## 2018-04-08 MED ORDER — IOPAMIDOL (ISOVUE-300) INJECTION 61%
100.0000 mL | Freq: Once | INTRAVENOUS | Status: AC | PRN
  Administered 2018-04-08: 100 mL via INTRAVENOUS

## 2018-04-09 ENCOUNTER — Encounter: Payer: Self-pay | Admitting: Anesthesiology

## 2018-04-09 ENCOUNTER — Other Ambulatory Visit: Payer: Self-pay

## 2018-04-09 ENCOUNTER — Ambulatory Visit: Payer: Medicare Other | Attending: Anesthesiology | Admitting: Anesthesiology

## 2018-04-09 VITALS — BP 121/91 | HR 87 | Temp 98.6°F | Resp 18 | Ht 67.0 in | Wt 155.0 lb

## 2018-04-09 DIAGNOSIS — Q675 Congenital deformity of spine: Secondary | ICD-10-CM

## 2018-04-09 DIAGNOSIS — Z79891 Long term (current) use of opiate analgesic: Secondary | ICD-10-CM | POA: Diagnosis not present

## 2018-04-09 DIAGNOSIS — M79644 Pain in right finger(s): Secondary | ICD-10-CM | POA: Insufficient documentation

## 2018-04-09 DIAGNOSIS — G8929 Other chronic pain: Secondary | ICD-10-CM | POA: Diagnosis not present

## 2018-04-09 DIAGNOSIS — M4120 Other idiopathic scoliosis, site unspecified: Secondary | ICD-10-CM | POA: Diagnosis not present

## 2018-04-09 DIAGNOSIS — K5903 Drug induced constipation: Secondary | ICD-10-CM | POA: Diagnosis not present

## 2018-04-09 DIAGNOSIS — M5442 Lumbago with sciatica, left side: Secondary | ICD-10-CM | POA: Insufficient documentation

## 2018-04-09 DIAGNOSIS — M79609 Pain in unspecified limb: Secondary | ICD-10-CM

## 2018-04-09 DIAGNOSIS — M4126 Other idiopathic scoliosis, lumbar region: Secondary | ICD-10-CM | POA: Insufficient documentation

## 2018-04-09 DIAGNOSIS — M5431 Sciatica, right side: Secondary | ICD-10-CM

## 2018-04-09 DIAGNOSIS — M79641 Pain in right hand: Secondary | ICD-10-CM | POA: Insufficient documentation

## 2018-04-09 DIAGNOSIS — G894 Chronic pain syndrome: Secondary | ICD-10-CM

## 2018-04-09 DIAGNOSIS — M542 Cervicalgia: Secondary | ICD-10-CM | POA: Diagnosis present

## 2018-04-09 DIAGNOSIS — M5441 Lumbago with sciatica, right side: Secondary | ICD-10-CM | POA: Diagnosis not present

## 2018-04-09 DIAGNOSIS — K5909 Other constipation: Secondary | ICD-10-CM | POA: Diagnosis not present

## 2018-04-09 DIAGNOSIS — M25512 Pain in left shoulder: Secondary | ICD-10-CM | POA: Diagnosis not present

## 2018-04-09 DIAGNOSIS — M5136 Other intervertebral disc degeneration, lumbar region: Secondary | ICD-10-CM

## 2018-04-09 DIAGNOSIS — Z79899 Other long term (current) drug therapy: Secondary | ICD-10-CM | POA: Insufficient documentation

## 2018-04-09 DIAGNOSIS — M25551 Pain in right hip: Secondary | ICD-10-CM | POA: Diagnosis not present

## 2018-04-09 DIAGNOSIS — M51369 Other intervertebral disc degeneration, lumbar region without mention of lumbar back pain or lower extremity pain: Secondary | ICD-10-CM

## 2018-04-09 DIAGNOSIS — F1190 Opioid use, unspecified, uncomplicated: Secondary | ICD-10-CM

## 2018-04-09 DIAGNOSIS — M5432 Sciatica, left side: Secondary | ICD-10-CM

## 2018-04-09 MED ORDER — OXYCODONE-ACETAMINOPHEN 5-325 MG PO TABS: 1.0000 | ORAL_TABLET | Freq: Two times a day (BID) | ORAL | 0 refills | Status: DC

## 2018-04-09 MED ORDER — ROPIVACAINE HCL 2 MG/ML IJ SOLN
10.0000 mL | Freq: Once | INTRAMUSCULAR | Status: AC
  Administered 2018-04-09: 10 mL via EPIDURAL

## 2018-04-09 MED ORDER — DEXAMETHASONE SODIUM PHOSPHATE 10 MG/ML IJ SOLN
10.0000 mg | Freq: Once | INTRAMUSCULAR | Status: AC
  Administered 2018-04-09: 10 mg

## 2018-04-09 MED ORDER — ROPIVACAINE HCL 2 MG/ML IJ SOLN
INTRAMUSCULAR | Status: AC
  Filled 2018-04-09: qty 10

## 2018-04-09 MED ORDER — BACLOFEN 10 MG PO TABS: 10.0000 mg | ORAL_TABLET | Freq: Three times a day (TID) | ORAL | 3 refills | Status: DC

## 2018-04-09 MED ORDER — DEXAMETHASONE SODIUM PHOSPHATE 10 MG/ML IJ SOLN
INTRAMUSCULAR | Status: AC
  Filled 2018-04-09: qty 1

## 2018-04-09 NOTE — Patient Instructions (Signed)
You were given 2 prescriptions for Percocet today. A prescription for Baclofen was sent to your pharmacy.

## 2018-04-09 NOTE — Progress Notes (Signed)
Nursing Pain Medication Assessment:  Safety precautions to be maintained throughout the outpatient stay will include: orient to surroundings, keep bed in low position, maintain call bell within reach at all times, provide assistance with transfer out of bed and ambulation.  Medication Inspection Compliance: Pill count conducted under aseptic conditions, in front of the patient. Neither the pills nor the bottle was removed from the patient's sight at any time. Once count was completed pills were immediately returned to the patient in their original bottle.  Medication: Oxycodone/APAP Pill/Patch Count: 49 of 120 pills remain Pill/Patch Appearance: Markings consistent with prescribed medication Bottle Appearance: Standard pharmacy container. Clearly labeled. Filled Date: 05/13 / 2018 Last Medication intake:  Yesterday

## 2018-04-10 ENCOUNTER — Telehealth: Payer: Self-pay

## 2018-04-10 NOTE — Telephone Encounter (Signed)
Post procedure phone call. Patient states she is doing good.  

## 2018-04-10 NOTE — Progress Notes (Signed)
Subjective:  Patient ID: Tami Hopkins, female    DOB: 05-03-1961  Age: 57 y.o. MRN: [id number]  CC: Back Pain (lower, right and left); Hand Pain (right); and Shoulder Pain (bilateral)   Procedure: 2 trigger point proximal PIP joint right index finger  HPI Tami Hopkins presents for reevaluation.  She was last seen 2 months ago and has had some persistent pain in the cervical neck region and low back however she presents today with fairly severe unremitting pain around the right hand at the right index finger at the PIP joint.  Describes a pain that is worse with motion and aggravated by some recent activity.  She was doing some work with requirements for excessive squeezing the right hand and this is aggravated some pain in the right index finger.  Pain is quite severe and unremitting.  She is tried ice and conservative therapy and medication management along with anti-inflammatories without significant success.  She has no problems with swelling or recent fevers or chills.  She has had this problem in the past and has received previous injection therapy for it with good result.  In regards to her neck pain she still having some bilateral trapezius pain that is present despite efforts of physical therapy stretching strengthening activity.  The pain is persistent and requires her muscle relaxants to get relief.  She also has persistent low back pain which is unremitting as well and despite injection therapy this continues.  Based on her narcotic assessment sheet she continues to derive good functional lifestyle improvement with the medications.  Otherwise she is in her usual state of health with no changes in lower extremity strength or function or bowel bladder function.  Outpatient Medications Prior to Visit  Medication Sig Dispense Refill  . clonazePAM (KLONOPIN) 0.5 MG tablet Take 0.25 mg by mouth daily.     . diphenoxylate-atropine (LOMOTIL) 2.5-0.025 MG per tablet Take 1 tablet by mouth 4  (four) times daily as needed for diarrhea or loose stools.    Marland Kitchen estradiol (ESTRACE VAGINAL) 0.1 MG/GM vaginal cream 1 gram vaginally nightly at bedtime for 2 weeks,  then 1 gram vaginally twice weekly 42.5 g 0  . FLOVENT HFA 110 MCG/ACT inhaler Inhale 2 puffs into the lungs at bedtime.     . fluticasone (FLONASE) 50 MCG/ACT nasal spray Place 1 spray into both nostrils 2 (two) times daily.    . furosemide (LASIX) 40 MG tablet Take 1 tablet by mouth as needed.     Marland Kitchen ipratropium (ATROVENT) 0.06 % nasal spray Place into the nose.    Marland Kitchen KLOR-CON 10 10 MEQ tablet Take 10 mEq by mouth as needed.   5  . lubiprostone (AMITIZA) 24 MCG capsule Take 1 capsule (24 mcg total) by mouth 2 (two) times daily with a meal. 60 capsule 3  . montelukast (SINGULAIR) 10 MG tablet Take 10 mg by mouth at bedtime.    Marland Kitchen omeprazole (PRILOSEC) 40 MG capsule Take 1 capsule by mouth as needed.    Marland Kitchen PROAIR HFA 108 (90 BASE) MCG/ACT inhaler Inhale 2 puffs into the lungs every 4 (four) hours as needed.    . promethazine (PHENERGAN) 25 MG tablet Take 50 mg by mouth as needed.     . traZODone (DESYREL) 50 MG tablet Take 1 tablet by mouth daily.    Marland Kitchen zolpidem (AMBIEN) 10 MG tablet Take 10 mg by mouth at bedtime as needed for sleep.    Marland Kitchen oxyCODONE-acetaminophen (PERCOCET/ROXICET) 5-325 MG tablet Take by  mouth 2 (two) times daily.    . risperiDONE (RISPERDAL) 1 MG tablet Take 1 tablet (1 mg total) by mouth at bedtime. (Patient not taking: Reported on 03/24/2018) 30 tablet 2  . ZETIA 10 MG tablet Take 10 mg by mouth daily.  5  . baclofen (LIORESAL) 10 MG tablet Take 1 tablet (10 mg total) by mouth 3 (three) times daily. 90 tablet 1   No facility-administered medications prior to visit.     Review of Systems CNS: No confusion or sedation Cardiac: No angina or palpitations GI: No abdominal pain or constipation Constitutional: No nausea vomiting fevers or chills  Objective:  BP (!) 121/91   Pulse 87   Temp 98.6 F (37 C) (Oral)    Resp 18   Ht 5\' 7"  (1.702 m)   Wt 155 lb (70.3 kg)   SpO2 100%   BMI 24.28 kg/m    BP Readings from Last 3 Encounters:  04/09/18 (!) 121/91  04/01/18 112/78  03/24/18 120/80     Wt Readings from Last 3 Encounters:  04/09/18 155 lb (70.3 kg)  04/01/18 157 lb (71.2 kg)  03/24/18 157 lb (71.2 kg)     Physical Exam Pt is alert and oriented PERRL EOMI HEART IS RRR no murmur or rub LCTA no wheezing or rales MUSCULOSKELETAL reveals tenderness at the base of the PIP joint right index finger.  This tender in the medial and lateral aspects.  There is no evidence of any infection or swelling or erythema.  Her range of motion is good and muscle tone is good her grip strength is intact bilaterally and symmetric bicep tricep strength is intact as well and she does have 2 trigger points in the bilateral trapezius.  Labs  No results found for: HGBA1C Lab Results  Component Value Date   CREATININE 0.72 05/14/2015    -------------------------------------------------------------------------------------------------------------------- Lab Results  Component Value Date   WBC 12.0 (H) 05/11/2015   HGB 12.1 05/11/2015   HCT 36.1 05/11/2015   PLT 223 05/11/2015   GLUCOSE 118 (H) 05/14/2015   ALT 148 (H) 05/14/2015   AST 170 (H) 05/14/2015   NA 138 05/14/2015   K 3.7 05/14/2015   CL 105 05/14/2015   CREATININE 0.72 05/14/2015   BUN 11 05/14/2015   CO2 25 05/14/2015   TSH 0.912 05/11/2015    --------------------------------------------------------------------------------------------------------------------- Ct Abdomen Pelvis W Contrast  Result Date: 04/08/2018 CLINICAL DATA:  57 year old female with right lower quadrant pain for 2 years. History of endometriosis. Post hysterectomy and removal of 1 ovary. Chronic constipation. No history of cancer. Prior cholecystectomy. Initial encounter. EXAM: CT ABDOMEN AND PELVIS WITH CONTRAST TECHNIQUE: Multidetector CT imaging of the abdomen and  pelvis was performed using the standard protocol following bolus administration of intravenous contrast. CONTRAST:  157mL ISOVUE-300 IOPAMIDOL (ISOVUE-300) INJECTION 61% COMPARISON:  05/16/2012 CT. FINDINGS: Lower chest: No worrisome lung base abnormality. Heart size within normal limits. Hepatobiliary: No worrisome hepatic lesion.  Post cholecystectomy. Pancreas: No worrisome pancreatic lesion. Tiny areas of interspersed fat. No pancreatic inflammation. Spleen: No splenic mass or enlargement. Adrenals/Urinary Tract: No obstructing stone or hydronephrosis. Right lower pole 2 mm nonobstructing stone. Bilateral renal low-density rounded structures, larger one which is a cyst although others too small to adequately characterize (although statistically likely cysts). No adrenal mass. Contracted noncontrast filled urinary bladder without gross abnormality. Stomach/Bowel: No extraluminal bowel inflammatory process. Specifically, no inflammation surrounds the appendix or terminal ileum. No endometrial implant identified. No gastric or small bowel abnormality noted.  Vascular/Lymphatic: No aortic aneurysm or large vessel occlusion. Scattered normal size lymph nodes without adenopathy. Reproductive: Post hysterectomy. Right ovary remains in place without enlargement. Phleboliths of gonadal veins bilaterally incidentally noted. Other: No free air or bowel containing hernia. Musculoskeletal: Dextroscoliosis lumbar spine with superimposed degenerative changes. IMPRESSION: No cause of right lower quadrant pain identified. Specifically, no bowel inflammatory process or endometrial implant identified. Post hysterectomy and left oophorectomy. Right ovary does not appear enlarged. No obstructing renal/ureteral calculi or hydronephrosis. Right lower pole 2 mm nonobstructing renal calculi. Bilateral renal low-density rounded structures, larger one which is a cyst although others too small to adequately characterize (although  statistically likely cysts). Post cholecystectomy. 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 Electronically Signed   By: Genia Del M.D.   On: 04/08/2018 08:53     Assessment & Plan:   Tami was seen today for back pain, hand pain and shoulder pain.  Diagnoses and all orders for this visit:  Congenital scoliosis  Chronic right-sided low back pain with right-sided sciatica  Chronic hip pain, right  Chronic pain syndrome  DDD (degenerative disc disease), lumbar  Bilateral sciatica  Scoliosis (and kyphoscoliosis), idiopathic  Chronic, continuous use of opioids  Drug-induced constipation  Trigger point of extremity  Cervicalgia -     INJECT TRIGGER POINT, 1 OR 2  Other orders -     baclofen (LIORESAL) 10 MG tablet; Take 1 tablet (10 mg total) by mouth 3 (three) times daily. -     Discontinue: oxyCODONE-acetaminophen (PERCOCET/ROXICET) 5-325 MG tablet; Take 1 tablet by mouth 2 (two) times daily. -     oxyCODONE-acetaminophen (PERCOCET/ROXICET) 5-325 MG tablet; Take 1 tablet by mouth 2 (two) times daily. -     ropivacaine (PF) 2 mg/mL (0.2%) (NAROPIN) injection 10 mL -     dexamethasone (DECADRON) injection 10 mg        ----------------------------------------------------------------------------------------------------------------------  Problem List Items Addressed This Visit      Unprioritized   Chronic hip pain, right (Chronic)   Relevant Medications   baclofen (LIORESAL) 10 MG tablet   oxyCODONE-acetaminophen (PERCOCET/ROXICET) 5-325 MG tablet   ropivacaine (PF) 2 mg/mL (0.2%) (NAROPIN) injection 10 mL (Completed)   dexamethasone (DECADRON) injection 10 mg (Completed)   Chronic pain syndrome (Chronic)   Chronic right-sided low back pain with right-sided sciatica (Chronic)   Relevant Medications   baclofen (LIORESAL) 10 MG tablet   oxyCODONE-acetaminophen (PERCOCET/ROXICET) 5-325 MG tablet   dexamethasone (DECADRON) injection 10  mg (Completed)   Congenital scoliosis - Primary (Chronic)    Other Visit Diagnoses    DDD (degenerative disc disease), lumbar       Relevant Medications   baclofen (LIORESAL) 10 MG tablet   oxyCODONE-acetaminophen (PERCOCET/ROXICET) 5-325 MG tablet   dexamethasone (DECADRON) injection 10 mg (Completed)   Bilateral sciatica       Relevant Medications   baclofen (LIORESAL) 10 MG tablet   Scoliosis (and kyphoscoliosis), idiopathic       Chronic, continuous use of opioids       Drug-induced constipation       Trigger point of extremity       Cervicalgia       Relevant Orders   INJECT TRIGGER POINT, 1 OR 2        ----------------------------------------------------------------------------------------------------------------------  1. Congenital scoliosis Continue with the current medication regimen.  We will have her continue with her muscle relaxants and refill on her medications.  She is been compliant with the Percocet usage and she is due  for refills for 713 and 812.  We have reviewed the [facility] practitioner database information and it is appropriate.  2. Chronic right-sided low back pain with right-sided sciatica Tenuous back stretching strengthening exercises once again reviewed today.  3. Chronic hip pain, right   4. Chronic pain syndrome   5. DDD (degenerative disc disease), lumbar   6. Bilateral sciatica   7. Scoliosis (and kyphoscoliosis), idiopathic   8. Chronic, continuous use of opioids As above  9. Drug-induced constipation She has recently been on amitiza a for this.  This is worked well for her.  We will refill this today.  10. Trigger point of extremity As requested by the patient we will proceed with injection therapy for her right finger pain.  Gone over the risks and benefits of the procedure with her in full detail all questions answered.  We have talked about the risk for infection and worsening pain.  We will proceed with this today and  have her return to clinic in 2 months for reevaluation and possible cervical trigger point injection.  11. Cervicalgia As above - INJECT TRIGGER POINT, 1 OR 2    ----------------------------------------------------------------------------------------------------------------------  I am having Tami Hopkins maintain her diphenoxylate-atropine, montelukast, furosemide, fluticasone, PROAIR HFA, ZETIA, KLOR-CON 10, promethazine, FLOVENT HFA, risperiDONE, zolpidem, clonazePAM, omeprazole, traZODone, ipratropium, estradiol, lubiprostone, baclofen, and oxyCODONE-acetaminophen. We administered ropivacaine (PF) 2 mg/mL (0.2%) and dexamethasone.   Meds ordered this encounter  Medications  . baclofen (LIORESAL) 10 MG tablet    Sig: Take 1 tablet (10 mg total) by mouth 3 (three) times daily.    Dispense:  90 tablet    Refill:  3  . DISCONTD: oxyCODONE-acetaminophen (PERCOCET/ROXICET) 5-325 MG tablet    Sig: Take 1 tablet by mouth 2 (two) times daily.    Dispense:  60 tablet    Refill:  0    Do not fill until 9390300...must last for 30 days.  Marland Kitchen oxyCODONE-acetaminophen (PERCOCET/ROXICET) 5-325 MG tablet    Sig: Take 1 tablet by mouth 2 (two) times daily.    Dispense:  60 tablet    Refill:  0    Do not fill until 92330076...must last for 30 days.  . ropivacaine (PF) 2 mg/mL (0.2%) (NAROPIN) injection 10 mL  . dexamethasone (DECADRON) injection 10 mg   Patient's Medications  New Prescriptions   No medications on file  Previous Medications   CLONAZEPAM (KLONOPIN) 0.5 MG TABLET    Take 0.25 mg by mouth daily.    DIPHENOXYLATE-ATROPINE (LOMOTIL) 2.5-0.025 MG PER TABLET    Take 1 tablet by mouth 4 (four) times daily as needed for diarrhea or loose stools.   ESTRADIOL (ESTRACE VAGINAL) 0.1 MG/GM VAGINAL CREAM    1 gram vaginally nightly at bedtime for 2 weeks,  then 1 gram vaginally twice weekly   FLOVENT HFA 110 MCG/ACT INHALER    Inhale 2 puffs into the lungs at bedtime.    FLUTICASONE  (FLONASE) 50 MCG/ACT NASAL SPRAY    Place 1 spray into both nostrils 2 (two) times daily.   FUROSEMIDE (LASIX) 40 MG TABLET    Take 1 tablet by mouth as needed.    IPRATROPIUM (ATROVENT) 0.06 % NASAL SPRAY    Place into the nose.   KLOR-CON 10 10 MEQ TABLET    Take 10 mEq by mouth as needed.    LUBIPROSTONE (AMITIZA) 24 MCG CAPSULE    Take 1 capsule (24 mcg total) by mouth 2 (two) times daily with a meal.  MONTELUKAST (SINGULAIR) 10 MG TABLET    Take 10 mg by mouth at bedtime.   OMEPRAZOLE (PRILOSEC) 40 MG CAPSULE    Take 1 capsule by mouth as needed.   PROAIR HFA 108 (90 BASE) MCG/ACT INHALER    Inhale 2 puffs into the lungs every 4 (four) hours as needed.   PROMETHAZINE (PHENERGAN) 25 MG TABLET    Take 50 mg by mouth as needed.    RISPERIDONE (RISPERDAL) 1 MG TABLET    Take 1 tablet (1 mg total) by mouth at bedtime.   TRAZODONE (DESYREL) 50 MG TABLET    Take 1 tablet by mouth daily.   ZETIA 10 MG TABLET    Take 10 mg by mouth daily.   ZOLPIDEM (AMBIEN) 10 MG TABLET    Take 10 mg by mouth at bedtime as needed for sleep.  Modified Medications   Modified Medication Previous Medication   BACLOFEN (LIORESAL) 10 MG TABLET baclofen (LIORESAL) 10 MG tablet      Take 1 tablet (10 mg total) by mouth 3 (three) times daily.    Take 1 tablet (10 mg total) by mouth 3 (three) times daily.   OXYCODONE-ACETAMINOPHEN (PERCOCET/ROXICET) 5-325 MG TABLET oxyCODONE-acetaminophen (PERCOCET/ROXICET) 5-325 MG tablet      Take 1 tablet by mouth 2 (two) times daily.    Take by mouth 2 (two) times daily.  Discontinued Medications   No medications on file   ----------------------------------------------------------------------------------------------------------------------  Follow-up: Return in about 2 months (around 06/09/2018) for evaluation, procedure.  The right dorsal surface of the hand was prepped with a ChloraPrep x3 and using strict aseptic technique I then injected the medial and lateral aspect of the area  proximal to the PIP joint with a 25-gauge needle and 1 cc of ropivacaine 0.2% and Decadron 2 mg.  This is tolerated without difficulty and she was convalesced discharged home in stable condition for follow-up as mentioned.  Molli Barrows, MD

## 2018-04-15 ENCOUNTER — Telehealth: Payer: Self-pay

## 2018-04-15 NOTE — Telephone Encounter (Signed)
Attempted to call patient, message left. 

## 2018-04-15 NOTE — Telephone Encounter (Signed)
The patient is still having extreme pain in her fingers. She had a shot last Thursday. She wants a nurse to call her and let her know if this is normal.

## 2018-04-16 NOTE — Telephone Encounter (Signed)
Continues to have pain in hand that was injected 6 days ago. Advised that may take 7-10 days to have releif from steroid. She should call if redness, swelling, fever, denies any of these symptoms.

## 2018-04-17 ENCOUNTER — Telehealth: Payer: Self-pay

## 2018-04-17 ENCOUNTER — Other Ambulatory Visit: Payer: Self-pay | Admitting: Obstetrics and Gynecology

## 2018-04-17 MED ORDER — FLUCONAZOLE 150 MG PO TABS: 150.0000 mg | ORAL_TABLET | Freq: Once | ORAL | 0 refills | Status: AC

## 2018-04-17 NOTE — Progress Notes (Signed)
Rx for yeast. RTO if sx persist.

## 2018-04-17 NOTE — Telephone Encounter (Signed)
Pt calling to see if AMS would send in rx for yeast.  She was having sxs before he gave her the estradiol.  She has tried a 3d medication otc.  [phone]

## 2018-04-17 NOTE — Telephone Encounter (Signed)
Rx diflucan eRxd. RTO for further eval if sx persist. May be BV and not yeast.

## 2018-04-17 NOTE — Telephone Encounter (Signed)
Spoke w/pt to inquire about details of s&s. Pt reports she is having vaginal itching/burning, some d/c w/slight odor x3 wks. She used 3 day OTC Suppository yeast treatment which helped only for about 12 hours. [place]

## 2018-04-18 NOTE — Telephone Encounter (Signed)
Pt aware of rx

## 2018-05-05 ENCOUNTER — Telehealth: Payer: Self-pay | Admitting: *Deleted

## 2018-05-05 NOTE — Telephone Encounter (Signed)
Will discuss with Dr. [name].

## 2018-05-07 NOTE — Telephone Encounter (Signed)
Spoke with Dr. [name], is pain deep? Has she seen ortho? Attempted to call patient, message left.

## 2018-05-08 NOTE — Telephone Encounter (Signed)
Attempted to call, message left. 

## 2018-05-08 NOTE — Telephone Encounter (Signed)
Has not seen ortho. Advised to do so before iinjections will be done.

## 2018-05-30 ENCOUNTER — Other Ambulatory Visit: Payer: Self-pay | Admitting: Neurosurgery

## 2018-05-30 DIAGNOSIS — M545 Low back pain, unspecified: Secondary | ICD-10-CM

## 2018-05-30 DIAGNOSIS — G8929 Other chronic pain: Secondary | ICD-10-CM

## 2018-06-11 ENCOUNTER — Ambulatory Visit
Admission: RE | Admit: 2018-06-11 | Discharge: 2018-06-11 | Disposition: A | Discharge status: Home / Self Care | Payer: Medicare Other | Source: Ambulatory Visit | Attending: Neurosurgery | Admitting: Neurosurgery

## 2018-06-11 DIAGNOSIS — M545 Low back pain, unspecified: Secondary | ICD-10-CM

## 2018-06-11 DIAGNOSIS — M47892 Other spondylosis, cervical region: Secondary | ICD-10-CM | POA: Insufficient documentation

## 2018-06-11 DIAGNOSIS — M47896 Other spondylosis, lumbar region: Secondary | ICD-10-CM | POA: Diagnosis not present

## 2018-06-11 DIAGNOSIS — G8929 Other chronic pain: Secondary | ICD-10-CM | POA: Insufficient documentation

## 2018-06-18 ENCOUNTER — Ambulatory Visit: Payer: Medicare Other | Admitting: Anesthesiology

## 2018-06-26 DIAGNOSIS — R438 Other disturbances of smell and taste: Secondary | ICD-10-CM | POA: Insufficient documentation

## 2018-07-02 ENCOUNTER — Encounter: Payer: Self-pay | Admitting: Anesthesiology

## 2018-07-02 ENCOUNTER — Ambulatory Visit: Payer: Medicare Other | Attending: Anesthesiology | Admitting: Anesthesiology

## 2018-07-02 ENCOUNTER — Other Ambulatory Visit: Payer: Self-pay

## 2018-07-02 VITALS — BP 108/86 | HR 78 | Temp 98.5°F | Resp 18 | Ht 67.0 in | Wt 153.0 lb

## 2018-07-02 DIAGNOSIS — M5441 Lumbago with sciatica, right side: Secondary | ICD-10-CM | POA: Diagnosis not present

## 2018-07-02 DIAGNOSIS — Q675 Congenital deformity of spine: Secondary | ICD-10-CM | POA: Diagnosis not present

## 2018-07-02 DIAGNOSIS — F1190 Opioid use, unspecified, uncomplicated: Secondary | ICD-10-CM

## 2018-07-02 DIAGNOSIS — M25551 Pain in right hip: Secondary | ICD-10-CM

## 2018-07-02 DIAGNOSIS — G8929 Other chronic pain: Secondary | ICD-10-CM

## 2018-07-02 DIAGNOSIS — G894 Chronic pain syndrome: Secondary | ICD-10-CM | POA: Insufficient documentation

## 2018-07-02 DIAGNOSIS — Z79899 Other long term (current) drug therapy: Secondary | ICD-10-CM | POA: Diagnosis not present

## 2018-07-02 DIAGNOSIS — Z79891 Long term (current) use of opiate analgesic: Secondary | ICD-10-CM | POA: Insufficient documentation

## 2018-07-02 DIAGNOSIS — M542 Cervicalgia: Secondary | ICD-10-CM

## 2018-07-02 DIAGNOSIS — Z760 Encounter for issue of repeat prescription: Secondary | ICD-10-CM | POA: Diagnosis not present

## 2018-07-02 DIAGNOSIS — M5136 Other intervertebral disc degeneration, lumbar region: Secondary | ICD-10-CM

## 2018-07-02 DIAGNOSIS — M25511 Pain in right shoulder: Secondary | ICD-10-CM | POA: Insufficient documentation

## 2018-07-02 MED ORDER — OXYCODONE-ACETAMINOPHEN 5-325 MG PO TABS: 1.0000 | ORAL_TABLET | Freq: Two times a day (BID) | ORAL | 0 refills | Status: DC

## 2018-07-02 MED ORDER — ROPIVACAINE HCL 2 MG/ML IJ SOLN
10.0000 mL | Freq: Once | INTRAMUSCULAR | Status: DC
  Filled 2018-07-02: qty 10

## 2018-07-02 MED ORDER — DEXAMETHASONE SODIUM PHOSPHATE 10 MG/ML IJ SOLN
10.0000 mg | Freq: Once | INTRAMUSCULAR | Status: AC
  Administered 2018-07-02: 10 mg
  Filled 2018-07-02: qty 1

## 2018-07-02 MED ORDER — LIDOCAINE HCL (PF) 1 % IJ SOLN
INTRAMUSCULAR | Status: AC
  Filled 2018-07-02: qty 5

## 2018-07-02 MED ORDER — BACLOFEN 10 MG PO TABS: 10.0000 mg | ORAL_TABLET | Freq: Three times a day (TID) | ORAL | 2 refills | Status: DC

## 2018-07-02 NOTE — Progress Notes (Signed)
Subjective:  Patient ID: Tami Hopkins, female    DOB: Jul 14, 1961  Age: 57 y.o. MRN: [id number]  CC: Medication Refill and Procedure   Procedure: Bilateral trapezius muscle trigger point injection x2 for total of 4  HPI Tami Hopkins presents for reevaluation.  She was last seen in June and had PIP trigger point injections done which has given her approximate 50% relief in her finger pain.  At this point she is having significant bilateral trapezius pain.  In the past she is had trigger point injections for this and has had a recurrence and recent aggravation of her neck and shoulder pain.  The pain that she has in the neck radiates into the bilateral trapezius and into the bilateral shoulders.  She is doing stretching strengthening exercises but these have not given her significant relief.  She is currently taking occasional Klonopin in addition to baclofen 3 times a day for diffuse body muscle spasm and and chronic pain syndrome and taking oxycodone twice a day for her pain.  She is also followed by Dr. Cari Caraway who does periodic lumbar x-ray to evaluate her scoliosis and chronic low back pain.  Based on her narcotic assessment sheet she continues to derive good functional lifestyle improvement with her medications.  No untoward side effects are noted and no diverting or illicit use is noted.  Outpatient Medications Prior to Visit  Medication Sig Dispense Refill  . clonazePAM (KLONOPIN) 0.5 MG tablet Take 0.25 mg by mouth daily.     . diphenoxylate-atropine (LOMOTIL) 2.5-0.025 MG per tablet Take 1 tablet by mouth 4 (four) times daily as needed for diarrhea or loose stools.    Marland Kitchen estradiol (ESTRACE VAGINAL) 0.1 MG/GM vaginal cream 1 gram vaginally nightly at bedtime for 2 weeks,  then 1 gram vaginally twice weekly 42.5 g 0  . FLOVENT HFA 110 MCG/ACT inhaler Inhale 2 puffs into the lungs at bedtime.     . fluticasone (FLONASE) 50 MCG/ACT nasal spray Place 1 spray into both nostrils 2 (two)  times daily.    . furosemide (LASIX) 40 MG tablet Take 1 tablet by mouth as needed.     Marland Kitchen ipratropium (ATROVENT) 0.06 % nasal spray Place into the nose.    Marland Kitchen KLOR-CON 10 10 MEQ tablet Take 10 mEq by mouth as needed.   5  . lubiprostone (AMITIZA) 24 MCG capsule Take 1 capsule (24 mcg total) by mouth 2 (two) times daily with a meal. 60 capsule 3  . montelukast (SINGULAIR) 10 MG tablet Take 10 mg by mouth at bedtime.    Marland Kitchen PROAIR HFA 108 (90 BASE) MCG/ACT inhaler Inhale 2 puffs into the lungs every 4 (four) hours as needed.    . promethazine (PHENERGAN) 25 MG tablet Take 50 mg by mouth as needed.     . risperiDONE (RISPERDAL) 1 MG tablet Take 1 tablet (1 mg total) by mouth at bedtime. (Patient not taking: Reported on 03/24/2018) 30 tablet 2  . traZODone (DESYREL) 50 MG tablet Take 1 tablet by mouth daily.    Marland Kitchen ZETIA 10 MG tablet Take 10 mg by mouth daily.  5  . zolpidem (AMBIEN) 10 MG tablet Take 10 mg by mouth at bedtime as needed for sleep.    . baclofen (LIORESAL) 10 MG tablet Take 1 tablet (10 mg total) by mouth 3 (three) times daily. 90 tablet 3  . oxyCODONE-acetaminophen (PERCOCET/ROXICET) 5-325 MG tablet Take 1 tablet by mouth 2 (two) times daily. 60 tablet 0   No facility-administered  medications prior to visit.     Review of Systems CNS: No confusion or sedation Cardiac: No angina or palpitations GI: No abdominal pain or constipation Constitutional: No nausea vomiting fevers or chills  Objective:  BP 115/76   Pulse 75   Temp 98.5 F (36.9 C) (Oral)   Resp 18   Ht 5\' 7"  (1.702 m)   Wt 153 lb (69.4 kg)   SpO2 96%   BMI 23.96 kg/m    BP Readings from Last 3 Encounters:  07/02/18 115/76  04/09/18 (!) 121/91  04/01/18 112/78     Wt Readings from Last 3 Encounters:  07/02/18 153 lb (69.4 kg)  04/09/18 155 lb (70.3 kg)  04/01/18 157 lb (71.2 kg)     Physical Exam Pt is alert and oriented PERRL EOMI HEART IS RRR no murmur or rub LCTA no wheezing or  rales MUSCULOSKELETAL he has 2 trigger points in the mid body trapezius both left and right.  She has good range of motion at the glenohumeral joint bilaterally but has some pain with this.  Her muscle tone and bulk to the upper extremities is intact and appropriate.  Labs  No results found for: HGBA1C Lab Results  Component Value Date   CREATININE 0.72 05/14/2015    -------------------------------------------------------------------------------------------------------------------- Lab Results  Component Value Date   WBC 12.0 (H) 05/11/2015   HGB 12.1 05/11/2015   HCT 36.1 05/11/2015   PLT 223 05/11/2015   GLUCOSE 118 (H) 05/14/2015   ALT 148 (H) 05/14/2015   AST 170 (H) 05/14/2015   NA 138 05/14/2015   K 3.7 05/14/2015   CL 105 05/14/2015   CREATININE 0.72 05/14/2015   BUN 11 05/14/2015   CO2 25 05/14/2015   TSH 0.912 05/11/2015    --------------------------------------------------------------------------------------------------------------------- Dg Scoliosis Eval Complete Spine 2 Or 3 Views  Result Date: 06/11/2018 CLINICAL DATA:  Chronic low back pain.  Scoliosis. EXAM: DG SCOLIOSIS EVAL COMPLETE SPINE 2-3V COMPARISON:  None. FINDINGS: Anterolisthesis C3-4 and C4-5. Disc degeneration and spurring at C5-6. Thoracic spine mild degenerative change in the lower thoracic spine. Lumbar dextroscoliosis at L1 measuring 39 degrees. Disc degeneration and spurring on the left at T12-L1, L1-2, L2-3, L3-4. Negative for fracture in the spine. IMPRESSION: 39 degrees of dextroscoliosis lumbar spine with degenerative change on the convexity of the scoliosis. Cervical spondylosis most prominent at C5-6. Electronically Signed   By: Franchot Gallo M.D.   On: 06/11/2018 10:14     Assessment & Plan:   Gearline was seen today for medication refill and procedure.  Diagnoses and all orders for this visit:  Congenital scoliosis  Chronic right-sided low back pain with right-sided  sciatica  Chronic hip pain, right  Chronic pain syndrome  DDD (degenerative disc disease), lumbar  Chronic, continuous use of opioids  Cervicalgia  Other orders -     Discontinue: oxyCODONE-acetaminophen (PERCOCET/ROXICET) 5-325 MG tablet; Take 1 tablet by mouth 2 (two) times daily. -     oxyCODONE-acetaminophen (PERCOCET/ROXICET) 5-325 MG tablet; Take 1 tablet by mouth 2 (two) times daily. -     baclofen (LIORESAL) 10 MG tablet; Take 1 tablet (10 mg total) by mouth 3 (three) times daily. -     dexamethasone (DECADRON) injection 10 mg -     ropivacaine (PF) 2 mg/mL (0.2%) (NAROPIN) injection 10 mL        ----------------------------------------------------------------------------------------------------------------------  Problem List Items Addressed This Visit      Unprioritized   Chronic hip pain, right (Chronic)   Relevant  Medications   oxyCODONE-acetaminophen (PERCOCET/ROXICET) 5-325 MG tablet   baclofen (LIORESAL) 10 MG tablet   dexamethasone (DECADRON) injection 10 mg (Completed)   ropivacaine (PF) 2 mg/mL (0.2%) (NAROPIN) injection 10 mL   Chronic pain syndrome (Chronic)   Chronic right-sided low back pain with right-sided sciatica (Chronic)   Relevant Medications   oxyCODONE-acetaminophen (PERCOCET/ROXICET) 5-325 MG tablet   baclofen (LIORESAL) 10 MG tablet   dexamethasone (DECADRON) injection 10 mg (Completed)   Congenital scoliosis - Primary (Chronic)    Other Visit Diagnoses    DDD (degenerative disc disease), lumbar       Relevant Medications   oxyCODONE-acetaminophen (PERCOCET/ROXICET) 5-325 MG tablet   baclofen (LIORESAL) 10 MG tablet   dexamethasone (DECADRON) injection 10 mg (Completed)   Chronic, continuous use of opioids       Cervicalgia            ----------------------------------------------------------------------------------------------------------------------  1. Congenital scoliosis Continue follow-up with Dr. Cari Caraway and continue  core stretching strengthening exercises.  She has been able to restart her biking which has helped with her low back pain and overall mental affect.  I think this is a positive.  I want her to continue doing this as tolerated.  2. Chronic right-sided low back pain with right-sided sciatica Continue core stretching strengthening  3. Chronic hip pain, right As above  4. Chronic pain syndrome As above  5. DDD (degenerative disc disease), lumbar As above  6. Chronic, continuous use of opioids Refill her medications today.  This will be for October 12 and September 12 on her oxycodone.  We have reviewed the [facility] practitioner database information and it is appropriate.  7. Cervicalgia Proceed with trigger point injection to the bilateral trapezius trigger points as previously documented.  We have gone over the risks and benefits of this with her in full detail all questions answered with return to clinic expected in 2 months.    ----------------------------------------------------------------------------------------------------------------------  I am having Tami Hopkins maintain her diphenoxylate-atropine, montelukast, furosemide, fluticasone, PROAIR HFA, ZETIA, KLOR-CON 10, promethazine, FLOVENT HFA, risperiDONE, zolpidem, clonazePAM, traZODone, ipratropium, estradiol, lubiprostone, oxyCODONE-acetaminophen, and baclofen. We administered dexamethasone.   Meds ordered this encounter  Medications  . DISCONTD: oxyCODONE-acetaminophen (PERCOCET/ROXICET) 5-325 MG tablet    Sig: Take 1 tablet by mouth 2 (two) times daily.    Dispense:  60 tablet    Refill:  0    Do not fill until [id number]...must last for 30 days.  Marland Kitchen oxyCODONE-acetaminophen (PERCOCET/ROXICET) 5-325 MG tablet    Sig: Take 1 tablet by mouth 2 (two) times daily.    Dispense:  60 tablet    Refill:  0    Do not fill until [id number]...must last for 30 days.  . baclofen (LIORESAL) 10 MG tablet    Sig: Take 1 tablet  (10 mg total) by mouth 3 (three) times daily.    Dispense:  90 tablet    Refill:  2  . dexamethasone (DECADRON) injection 10 mg  . ropivacaine (PF) 2 mg/mL (0.2%) (NAROPIN) injection 10 mL   Patient's Medications  New Prescriptions   No medications on file  Previous Medications   CLONAZEPAM (KLONOPIN) 0.5 MG TABLET    Take 0.25 mg by mouth daily.    DIPHENOXYLATE-ATROPINE (LOMOTIL) 2.5-0.025 MG PER TABLET    Take 1 tablet by mouth 4 (four) times daily as needed for diarrhea or loose stools.   ESTRADIOL (ESTRACE VAGINAL) 0.1 MG/GM VAGINAL CREAM    1 gram vaginally nightly at bedtime for 2 weeks,  then 1 gram vaginally twice weekly   FLOVENT HFA 110 MCG/ACT INHALER    Inhale 2 puffs into the lungs at bedtime.    FLUTICASONE (FLONASE) 50 MCG/ACT NASAL SPRAY    Place 1 spray into both nostrils 2 (two) times daily.   FUROSEMIDE (LASIX) 40 MG TABLET    Take 1 tablet by mouth as needed.    IPRATROPIUM (ATROVENT) 0.06 % NASAL SPRAY    Place into the nose.   KLOR-CON 10 10 MEQ TABLET    Take 10 mEq by mouth as needed.    LUBIPROSTONE (AMITIZA) 24 MCG CAPSULE    Take 1 capsule (24 mcg total) by mouth 2 (two) times daily with a meal.   MONTELUKAST (SINGULAIR) 10 MG TABLET    Take 10 mg by mouth at bedtime.   PROAIR HFA 108 (90 BASE) MCG/ACT INHALER    Inhale 2 puffs into the lungs every 4 (four) hours as needed.   PROMETHAZINE (PHENERGAN) 25 MG TABLET    Take 50 mg by mouth as needed.    RISPERIDONE (RISPERDAL) 1 MG TABLET    Take 1 tablet (1 mg total) by mouth at bedtime.   TRAZODONE (DESYREL) 50 MG TABLET    Take 1 tablet by mouth daily.   ZETIA 10 MG TABLET    Take 10 mg by mouth daily.   ZOLPIDEM (AMBIEN) 10 MG TABLET    Take 10 mg by mouth at bedtime as needed for sleep.  Modified Medications   Modified Medication Previous Medication   BACLOFEN (LIORESAL) 10 MG TABLET baclofen (LIORESAL) 10 MG tablet      Take 1 tablet (10 mg total) by mouth 3 (three) times daily.    Take 1 tablet (10 mg  total) by mouth 3 (three) times daily.   OXYCODONE-ACETAMINOPHEN (PERCOCET/ROXICET) 5-325 MG TABLET oxyCODONE-acetaminophen (PERCOCET/ROXICET) 5-325 MG tablet      Take 1 tablet by mouth 2 (two) times daily.    Take 1 tablet by mouth 2 (two) times daily.  Discontinued Medications   No medications on file   ----------------------------------------------------------------------------------------------------------------------  Follow-up: Return for evaluation, med refill.  Trigger point injection: The area overlying the aforementioned trigger points were prepped with alcohol. They were then injected with a 25-gauge needle with 2 cc of ropivacaine 0.2% and Decadron 2 mg at each site after negative aspiration for heme. This was performed after informed consent was obtained and risks and benefits reviewed. She tolerated this procedure without difficulty and was convalesced and discharged to home in stable condition for follow-up as mentioned.  @Inocente Krach  [name], MD@  Molli Barrows, MD

## 2018-07-02 NOTE — Patient Instructions (Signed)
You have been given 2 prescriptions for Oxycodone-acetaminophen to last until 09/02/2018

## 2018-07-02 NOTE — Progress Notes (Signed)
Nursing Pain Medication Assessment:  Safety precautions to be maintained throughout the outpatient stay will include: orient to surroundings, keep bed in low position, maintain call bell within reach at all times, provide assistance with transfer out of bed and ambulation.  Medication Inspection Compliance: Pill count conducted under aseptic conditions, in front of the patient. Neither the pills nor the bottle was removed from the patient's sight at any time. Once count was completed pills were immediately returned to the patient in their original bottle.  Medication: Oxycodone/APAP Pill/Patch Count: 8 of 60 pills remain Pill/Patch Appearance: Markings consistent with prescribed medication Bottle Appearance: Standard pharmacy container. Clearly labeled. Filled Date: 08 / 13 / 2019 Last Medication intake:  Today

## 2018-07-03 ENCOUNTER — Ambulatory Visit: Payer: Medicare Other | Admitting: Obstetrics and Gynecology

## 2018-07-17 ENCOUNTER — Ambulatory Visit: Payer: Medicare Other | Admitting: Obstetrics and Gynecology

## 2018-07-21 ENCOUNTER — Telehealth: Payer: Self-pay | Admitting: Anesthesiology

## 2018-07-21 NOTE — Telephone Encounter (Signed)
Patient states she would like to get a back brace ordered. She is going to call [facility] and have them fax over the form to order this. They told her she needs Rx, Dx, and Notes. Please let patient know if she can get this from Dr. [name]

## 2018-07-24 ENCOUNTER — Telehealth: Payer: Self-pay

## 2018-07-24 ENCOUNTER — Encounter (INDEPENDENT_AMBULATORY_CARE_PROVIDER_SITE_OTHER): Payer: Medicare Other | Admitting: Vascular Surgery

## 2018-07-24 NOTE — Telephone Encounter (Signed)
Patient instructed to call the medical supply and get them to resend form.

## 2018-07-24 NOTE — Telephone Encounter (Signed)
Pt left voice mail wanting to know if Dr [name] would fax Dr Beaulah Corin, RX for back brace to [facility].

## 2018-07-28 ENCOUNTER — Telehealth: Payer: Self-pay | Admitting: Anesthesiology

## 2018-07-28 NOTE — Telephone Encounter (Signed)
Patient states per vmail 07-28-18 at 10:13, [facility] needs a Script written by Dr. [name] for a back brace before they can get one for patient. Please let her know if this can be done.

## 2018-09-01 ENCOUNTER — Encounter: Payer: Self-pay | Admitting: Nurse Practitioner

## 2018-09-01 ENCOUNTER — Ambulatory Visit: Payer: Medicare Other | Attending: Nurse Practitioner | Admitting: Nurse Practitioner

## 2018-09-01 ENCOUNTER — Other Ambulatory Visit: Payer: Self-pay

## 2018-09-01 VITALS — BP 117/84 | HR 82 | Temp 98.6°F | Ht 67.0 in | Wt 145.0 lb

## 2018-09-01 DIAGNOSIS — J449 Chronic obstructive pulmonary disease, unspecified: Secondary | ICD-10-CM | POA: Insufficient documentation

## 2018-09-01 DIAGNOSIS — M25551 Pain in right hip: Secondary | ICD-10-CM | POA: Diagnosis not present

## 2018-09-01 DIAGNOSIS — F5105 Insomnia due to other mental disorder: Secondary | ICD-10-CM | POA: Insufficient documentation

## 2018-09-01 DIAGNOSIS — M5441 Lumbago with sciatica, right side: Secondary | ICD-10-CM | POA: Diagnosis not present

## 2018-09-01 DIAGNOSIS — Z5181 Encounter for therapeutic drug level monitoring: Secondary | ICD-10-CM | POA: Diagnosis present

## 2018-09-01 DIAGNOSIS — G8929 Other chronic pain: Secondary | ICD-10-CM

## 2018-09-01 DIAGNOSIS — M6530 Trigger finger, unspecified finger: Secondary | ICD-10-CM | POA: Diagnosis not present

## 2018-09-01 DIAGNOSIS — G894 Chronic pain syndrome: Secondary | ICD-10-CM | POA: Insufficient documentation

## 2018-09-01 DIAGNOSIS — Z79899 Other long term (current) drug therapy: Secondary | ICD-10-CM | POA: Diagnosis not present

## 2018-09-01 DIAGNOSIS — Z79891 Long term (current) use of opiate analgesic: Secondary | ICD-10-CM | POA: Diagnosis not present

## 2018-09-01 DIAGNOSIS — E7800 Pure hypercholesterolemia, unspecified: Secondary | ICD-10-CM | POA: Insufficient documentation

## 2018-09-01 DIAGNOSIS — K219 Gastro-esophageal reflux disease without esophagitis: Secondary | ICD-10-CM | POA: Insufficient documentation

## 2018-09-01 DIAGNOSIS — Z720 Tobacco use: Secondary | ICD-10-CM | POA: Diagnosis not present

## 2018-09-01 DIAGNOSIS — Q675 Congenital deformity of spine: Secondary | ICD-10-CM | POA: Diagnosis not present

## 2018-09-01 DIAGNOSIS — F4310 Post-traumatic stress disorder, unspecified: Secondary | ICD-10-CM | POA: Insufficient documentation

## 2018-09-01 MED ORDER — OXYCODONE-ACETAMINOPHEN 5-325 MG PO TABS: 1.0000 | ORAL_TABLET | Freq: Two times a day (BID) | ORAL | 0 refills | Status: DC

## 2018-09-01 MED ORDER — BACLOFEN 10 MG PO TABS: 10.0000 mg | ORAL_TABLET | Freq: Three times a day (TID) | ORAL | 0 refills | Status: DC

## 2018-09-01 NOTE — Patient Instructions (Addendum)
baclofen (LIORESAL) 10 MG tablet and oxyCODONE-acetaminophen (PERCOCET/ROXICET) 5-325 MG tablet sent to your pharmacy and readu for pick up.Oxycodone to last for one month.  ____________________________________________________________________________________________  Medication Rules  Applies to: All patients receiving prescriptions (written or electronic).  Pharmacy of record: Pharmacy where electronic prescriptions will be sent. If written prescriptions are taken to a different pharmacy, please inform the nursing staff. The pharmacy listed in the electronic medical record should be the one where you would like electronic prescriptions to be sent.  Prescription refills: Only during scheduled appointments. Applies to both, written and electronic prescriptions.  NOTE: The following applies primarily to controlled substances (Opioid* Pain Medications).   Patient's responsibilities: 1. Pain Pills: Bring all pain pills to every appointment (except for procedure appointments). 2. Pill Bottles: Bring pills in original pharmacy bottle. Always bring newest bottle. Bring bottle, even if empty. 3. Medication refills: You are responsible for knowing and keeping track of what medications you need refilled. The day before your appointment, write a list of all prescriptions that need to be refilled. Bring that list to your appointment and give it to the admitting nurse. Prescriptions will be written only during appointments. If you forget a medication, it will not be "Called in", "Faxed", or "electronically sent". You will need to get another appointment to get these prescribed. 4. Prescription Accuracy: You are responsible for carefully inspecting your prescriptions before leaving our office. Have the discharge nurse carefully go over each prescription with you, before taking them home. Make sure that your name is accurately spelled, that your address is correct. Check the name and dose of your medication to  make sure it is accurate. Check the number of pills, and the written instructions to make sure they are clear and accurate. Make sure that you are given enough medication to last until your next medication refill appointment. 5. Taking Medication: Take medication as prescribed. Never take more pills than instructed. Never take medication more frequently than prescribed. Taking less pills or less frequently is permitted and encouraged, when it comes to controlled substances (written prescriptions).  6. Inform other Doctors: Always inform, all of your healthcare providers, of all the medications you take. 7. Pain Medication from other Providers: You are not allowed to accept any additional pain medication from any other Doctor or Healthcare provider. There are two exceptions to this rule. (see below) In the event that you require additional pain medication, you are responsible for notifying us, as stated below. 8. Medication Agreement: You are responsible for carefully reading and following our Medication Agreement. This must be signed before receiving any prescriptions from our practice. Safely store a copy of your signed Agreement. Violations to the Agreement will result in no further prescriptions. (Additional copies of our Medication Agreement are available upon request.) 9. Laws, Rules, & Regulations: All patients are expected to follow all Federal and [employer], [employer], Rules, [employer]. Ignorance of the Laws does not constitute a valid excuse. The use of any illegal substances is prohibited. 10. Adopted CDC guidelines & recommendations: Target dosing levels will be at or below 60 MME/day. Use of benzodiazepines** is not recommended.  Exceptions: There are only two exceptions to the rule of not receiving pain medications from other Healthcare Providers. 1. Exception #1 (Emergencies): In the event of an emergency (i.e.: accident requiring emergency care), you are allowed to receive additional pain  medication. However, you are responsible for: As soon as you are able, call our office [phone], at any time of  the day or night, and leave a message stating your name, the date and nature of the emergency, and the name and dose of the medication prescribed. In the event that your call is answered by a member of our staff, make sure to document and save the date, time, and the name of the person that took your information.  2. Exception #2 (Planned Surgery): In the event that you are scheduled by another doctor or dentist to have any type of surgery or procedure, you are allowed (for a period no longer than 30 days), to receive additional pain medication, for the acute post-op pain. However, in this case, you are responsible for picking up a copy of our "Post-op Pain Management for Surgeons" handout, and giving it to your surgeon or dentist. This document is available at our office, and does not require an appointment to obtain it. Simply go to our office during business hours (Monday-Thursday from 8:00 AM to 4:00 PM) (Friday 8:00 AM to 12:00 Noon) or if you have a scheduled appointment with [place], prior to your surgery, and ask for it by name. In addition, you will need to provide [place] with your name, name of your surgeon, type of surgery, and date of procedure or surgery.  *Opioid medications include: morphine, codeine, oxycodone, oxymorphone, hydrocodone, hydromorphone, meperidine, tramadol, tapentadol, buprenorphine, fentanyl, methadone. **Benzodiazepine medications include: diazepam (Valium), alprazolam (Xanax), clonazepam (Klonopine), lorazepam (Ativan), clorazepate (Tranxene), chlordiazepoxide (Librium), estazolam (Prosom), oxazepam (Serax), temazepam (Restoril), triazolam (Halcion) (Last updated:  12/19/2017) ____________________________________________________________________________________________    ______________________________________________________________________________________________  Specialty Pain Scale  Introduction:  There are significant differences in how pain is reported. The word pain usually refers to physical pain, but it is also a common synonym of suffering. The medical community uses a scale from 0 (zero) to 10 (ten) to report pain level. Zero (0) is described as "no pain", while ten (10) is described as "the worse pain you can imagine". The problem with this scale is that physical pain is reported along with suffering. Suffering refers to mental pain, or more often yet it refers to any unpleasant feeling, emotion or aversion associated with the perception of harm or threat of harm. It is the psychological component of pain.  Pain Specialists prefer to separate the two components. The pain scale used by this practice is the Verbal Numerical Rating Scale (VNRS-11). This scale is for the physical pain only. DO NOT INCLUDE how your pain psychologically affects you. This scale is for adults 56 years of age and older. It has 11 (eleven) levels. The 1st level is 0/10. This means: "right now, I have no pain". In the context of pain management, it also means: "right now, my physical pain is under control with the current therapy".  General Information:  The scale should reflect your current level of pain. Unless you are specifically asked for the level of your worst pain, or your average pain. If you are asked for one of these two, then it should be understood that it is over the past 24 hours.  Levels 1 (one) through 5 (five) are described below, and can be treated as an outpatient. Ambulatory pain management facilities such as ours are more than adequate to treat these levels. Levels 6 (six) through 10 (ten) are also described below, however, these must be treated as a  hospitalized patient. While levels 6 (six) and 7 (seven) may be evaluated at an urgent care facility, levels 8 (eight) through 10 (ten) constitute medical emergencies and as such,  they belong in a hospital's emergency department. When having these levels (as described below), do not come to our office. Our facility is not equipped to manage these levels. Go directly to an urgent care facility or an emergency department to be evaluated.  Definitions:  Activities of Daily Living (ADL): Activities of daily living (ADL or ADLs) is a term used in healthcare to refer to people's daily self-care activities. Health professionals often use a person's ability or inability to perform ADLs as a measurement of their functional status, particularly in regard to people post injury, with disabilities and the elderly. There are two ADL levels: Basic and Instrumental. Basic Activities of Daily Living (BADL  or BADLs) consist of self-care tasks that include: Bathing and showering; personal hygiene and grooming (including brushing/combing/styling hair); dressing; Toilet hygiene (getting to the toilet, cleaning oneself, and getting back up); eating and self-feeding (not including cooking or chewing and swallowing); functional mobility, often referred to as "transferring", as measured by the ability to walk, get in and out of bed, and get into and out of a chair; the broader definition (moving from one place to another while performing activities) is useful for people with different physical abilities who are still able to get around independently. Basic ADLs include the things many people do when they get up in the morning and get ready to go out of the house: get out of bed, go to the toilet, bathe, dress, groom, and eat. On the average, loss of function typically follows a particular order. Hygiene is the first to go, followed by loss of toilet use and locomotion. The last to go is the ability to eat. When there is only one  remaining area in which the person is independent, there is a 62.9% chance that it is eating and only a 3.5% chance that it is hygiene. Instrumental Activities of Daily Living (IADL or IADLs) are not necessary for fundamental functioning, but they let an individual live independently in a community. IADL consist of tasks that include: cleaning and maintaining the house; home establishment and maintenance; care of others (including selecting and supervising caregivers); care of pets; child rearing; managing money; managing financials (investments, etc.); meal preparation and cleanup; shopping for groceries and necessities; moving within the community; safety procedures and emergency responses; health management and maintenance (taking prescribed medications); and using the telephone or other form of communication.  Instructions:  Most patients tend to report their pain as a combination of two factors, their physical pain and their psychosocial pain. This last one is also known as "suffering" and it is reflection of how physical pain affects you socially and psychologically. From now on, report them separately.  From this point on, when asked to report your pain level, report only your physical pain. Use the following table for reference.  Pain Clinic Pain Levels (0-5/10)  Pain Level Score  Description  No Pain 0   Mild pain 1 Nagging, annoying, but does not interfere with basic activities of daily living (ADL). Patients are able to eat, bathe, get dressed, toileting (being able to get on and off the toilet and perform personal hygiene functions), transfer (move in and out of bed or a chair without assistance), and maintain continence (able to control bladder and bowel functions). Blood pressure and heart rate are unaffected. A normal heart rate for a healthy adult ranges from 60 to 100 bpm (beats per minute).   Mild to moderate pain 2 Noticeable and distracting. Impossible to hide from other  people. More  frequent flare-ups. Still possible to adapt and function close to normal. It can be very annoying and may have occasional stronger flare-ups. With discipline, patients may get used to it and adapt.   Moderate pain 3 Interferes significantly with activities of daily living (ADL). It becomes difficult to feed, bathe, get dressed, get on and off the toilet or to perform personal hygiene functions. Difficult to get in and out of bed or a chair without assistance. Very distracting. With effort, it can be ignored when deeply involved in activities.   Moderately severe pain 4 Impossible to ignore for more than a few minutes. With effort, patients may still be able to manage work or participate in some social activities. Very difficult to concentrate. Signs of autonomic nervous system discharge are evident: dilated pupils (mydriasis); mild sweating (diaphoresis); sleep interference. Heart rate becomes elevated (>115 bpm). Diastolic blood pressure (lower number) rises above 100 mmHg. Patients find relief in laying down and not moving.   Severe pain 5 Intense and extremely unpleasant. Associated with frowning face and frequent crying. Pain overwhelms the senses.  Ability to do any activity or maintain social relationships becomes significantly limited. Conversation becomes difficult. Pacing back and forth is common, as getting into a comfortable position is nearly impossible. Pain wakes you up from deep sleep. Physical signs will be obvious: pupillary dilation; increased sweating; goosebumps; brisk reflexes; cold, clammy hands and feet; nausea, vomiting or dry heaves; loss of appetite; significant sleep disturbance with inability to fall asleep or to remain asleep. When persistent, significant weight loss is observed due to the complete loss of appetite and sleep deprivation.  Blood pressure and heart rate becomes significantly elevated. Caution: If elevated blood pressure triggers a pounding headache associated with  blurred vision, then the patient should immediately seek attention at an urgent or emergency care unit, as these may be signs of an impending stroke.    Emergency Department Pain Levels (6-10/10)  Emergency Room Pain 6 Severely limiting. Requires emergency care and should not be seen or managed at an outpatient pain management facility. Communication becomes difficult and requires great effort. Assistance to reach the emergency department may be required. Facial flushing and profuse sweating along with potentially dangerous increases in heart rate and blood pressure will be evident.   Distressing pain 7 Self-care is very difficult. Assistance is required to transport, or use restroom. Assistance to reach the emergency department will be required. Tasks requiring coordination, such as bathing and getting dressed become very difficult.   Disabling pain 8 Self-care is no longer possible. At this level, pain is disabling. The individual is unable to do even the most "basic" activities such as walking, eating, bathing, dressing, transferring to a bed, or toileting. Fine motor skills are lost. It is difficult to think clearly.   Incapacitating pain 9 Pain becomes incapacitating. Thought processing is no longer possible. Difficult to remember your own name. Control of movement and coordination are lost.   The worst pain imaginable 10 At this level, most patients pass out from pain. When this level is reached, collapse of the autonomic nervous system occurs, leading to a sudden drop in blood pressure and heart rate. This in turn results in a temporary and dramatic drop in blood flow to the brain, leading to a loss of consciousness. Fainting is one of the body's self defense mechanisms. Passing out puts the brain in a calmed state and causes it to shut down for a while, in order to begin  the healing process.    Summary: 1. Refer to this scale when providing [place] with your pain level. 2. Be accurate and careful  when reporting your pain level. This will help with your care. 3. Over-reporting your pain level will lead to loss of credibility. 4. Even a level of 1/10 means that there is pain and will be treated at our facility. 5. High, inaccurate reporting will be documented as "Symptom Exaggeration", leading to loss of credibility and suspicions of possible secondary gains such as obtaining more narcotics, or wanting to appear disabled, for fraudulent reasons. 6. Only pain levels of 5 or below will be seen at our facility. 7. Pain levels of 6 and above will be sent to the Emergency Department and the appointment cancelled. ______________________________________________________________________________________________

## 2018-09-01 NOTE — Progress Notes (Signed)
Nursing Pain Medication Assessment:  Safety precautions to be maintained throughout the outpatient stay will include: orient to surroundings, keep bed in low position, maintain call bell within reach at all times, provide assistance with transfer out of bed and ambulation.  Medication Inspection Compliance: Pill count conducted under aseptic conditions, in front of the patient. Neither the pills nor the bottle was removed from the patient's sight at any time. Once count was completed pills were immediately returned to the patient in their original bottle.  Medication: Oxycodone/APAP Pill/Patch Count: 3 of 60 pills remain Pill/Patch Appearance: Markings consistent with prescribed medication Bottle Appearance: Standard pharmacy container. Clearly labeled. Filled Date: 10 / 12 / 2019 Last Medication intake:  Today

## 2018-09-01 NOTE — Progress Notes (Signed)
Patient's Name: Tami Hopkins  MRN: [id number]  Referring Provider: Barbaraann Boys, MD  DOB: 1961-05-26  PCP: Barbaraann Boys, MD  DOS: 09/01/2018  Note by: Vevelyn Francois NP  Service setting: Ambulatory outpatient  Specialty: Interventional Pain Management  Location: [facility] (AMB) Pain Management Facility    Patient type: Established    Primary Reason(s) for Visit: Encounter for prescription drug management & post-procedure evaluation of chronic illness with mild to moderate exacerbation(Level of risk: moderate) CC: Medication Refill  HPI  Tami Hopkins is a 57 y.o. year old, female patient, who comes today for a post-procedure evaluation and medication management. She has Rhabdomyolysis; Encephalopathy acute; Delirium, drug-induced ([place]); Posttraumatic stress disorder; Chronic obstructive pulmonary disease ([place]); Endometriosis; 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); Neurosis, posttraumatic; Chronic allergic rhinitis due to animal hair and dander; Chronic hip pain, right; Chronic nausea; Chronic right-sided low back pain with right-sided sciatica; Congenital scoliosis; Gastroesophageal reflux disease with esophagitis; High cholesterol; Irritable bowel syndrome with both constipation and diarrhea; Moderate persistent asthma without complication; Overweight (BMI 25.0-29.9); Tobacco use disorder; Trigger finger of right hand; Post-traumatic stress disorder; Chronic pain syndrome; and Bad taste in mouth on their problem list. Her primarily concern today is the Medication Refill  Pain Assessment: Location: Lower, Left, Right Back Radiating: Radiates down front of both legs; right leg in worse. Traveling pain in hips and groin area.  Onset: More than a month ago Duration: Chronic pain Quality: Constant, Shooting Severity: 9 /10 (subjective, self-reported pain score)  Note: Reported level is compatible with observation. Clinically the patient looks like a 0/10       Information on the proper use  of the pain scale provided to the patient today. When using our objective Pain Scale, levels between 6 and 10/10 are said to belong in an emergency room, as it progressively worsens from a 6/10, described as severely limiting, requiring emergency care not usually available at an outpatient pain management facility. At a 6/10 level, communication becomes difficult and requires great effort. Assistance to reach the emergency department may be required. Facial flushing and profuse sweating along with potentially dangerous increases in heart rate and blood pressure will be evident. Effect on ADL: Very limiting  Timing: Constant Modifying factors: Denies. Oxycodone not helping much right now  BP: 117/84  HR: 82  Tami Hopkins was last seen on 07/28/2018 for a procedure. During today's appointment we reviewed Tami Hopkins post-procedure results, as well as her outpatient medication regimen. She admits that she has stabbling pain in her right leg. She denies any weakness in her legs. She admits that when the sharp pan hits that she can not more from that spot. She is wanting to increase her medication. She has been seen by neurosurgery for her scoliosis however is not interested in any surgery.  She admits that she was instructed that she could have no more injections at this time.  Further details on both, my assessment(s), as well as the proposed treatment plan, please see below.  Controlled Substance Pharmacotherapy Assessment REMS (Risk Evaluation and Mitigation Strategy)  Analgesic: Oxycodone/acetaminophen 5/325 mg twice daily MME/day: 15 mg/day.  Janne Napoleon, RN  09/01/2018  9:30 AM  Sign at close encounter Nursing Pain Medication Assessment:  Safety precautions to be maintained throughout the outpatient stay will include: orient to surroundings, keep bed in low position, maintain call bell within reach at all times, provide assistance with transfer out of bed and ambulation.  Medication  Inspection Compliance: Pill count conducted  under aseptic conditions, in front of the patient. Neither the pills nor the bottle was removed from the patient's sight at any time. Once count was completed pills were immediately returned to the patient in their original bottle.  Medication: Oxycodone/APAP Pill/Patch Count: 3 of 60 pills remain Pill/Patch Appearance: Markings consistent with prescribed medication Bottle Appearance: Standard pharmacy container. Clearly labeled. Filled Date: 10 / 12 / 2019 Last Medication intake:  Today   Pharmacokinetics: Liberation and absorption (onset of action): WNL Distribution (time to peak effect): WNL Metabolism and excretion (duration of action): WNL         Pharmacodynamics: Desired effects: Analgesia: Tami Hopkins reports >50% benefit. Functional ability: Patient reports that medication allows her to accomplish basic ADLs Clinically meaningful improvement in function (CMIF): Sustained CMIF goals met Perceived effectiveness: Described as relatively effective, allowing for increase in activities of daily living (ADL) Undesirable effects: Side-effects or Adverse reactions: None reported Monitoring: [place] PMP: Online review of the past 40-monthperiod conducted. Compliant with practice rules and regulations Last UDS on record: Summary  Date Value Ref Range Status  12/19/2017 FINAL  Final    Comment:    ==================================================================== TOXASSURE SELECT 13 (MW) ==================================================================== Test                             Result       Flag       Units Drug Present and Declared for Prescription Verification   7-aminoclonazepam              136          EXPECTED   ng/mg creat    7-aminoclonazepam is an expected metabolite of clonazepam. Source    of clonazepam is a scheduled prescription medication.   Tramadol                       14916        EXPECTED   ng/mg creat    O-Desmethyltramadol            17512        EXPECTED   ng/mg creat   N-Desmethyltramadol            18688        EXPECTED   ng/mg creat    Source of tramadol is a prescription medication.    O-desmethyltramadol and N-desmethyltramadol are expected    metabolites of tramadol. Drug Present not Declared for Prescription Verification   Alprazolam                     104          UNEXPECTED ng/mg creat   Alpha-hydroxyalprazolam        268          UNEXPECTED ng/mg creat    Source of alprazolam is a scheduled prescription medication.    Alpha-hydroxyalprazolam is an expected metabolite of alprazolam. Drug Absent but Declared for Prescription Verification   Hydrocodone                    Not Detected UNEXPECTED ng/mg creat ==================================================================== Test                      Result    Flag   Units      Ref Range   Creatinine  25               mg/dL      >=20 ==================================================================== Declared Medications:  The flagging and interpretation on this report are based on the  following declared medications.  Unexpected results may arise from  inaccuracies in the declared medications.  **Note: The testing scope of this panel includes these medications:  Clonazepam (Klonopin)  Hydrocodone (Norco)  Tramadol (Ultram)  **Note: The testing scope of this panel does not include following  reported medications:  Acetaminophen (Norco)  Albuterol  Atropine (Lomotil)  Baclofen (Lioresal)  Diphenoxylate (Lomotil)  Ezetimibe (Zetia)  Fluticasone (Flonase)  Fluticasone (Flovent)  Furosemide (Lasix)  Montelukast (Singulair)  Omeprazole (Prilosec)  Potassium (Klor-Con)  Promethazine (Phenergan)  Risperidone (Risperdal)  Trazodone (Desyrel)  Zolpidem (Ambien) ==================================================================== For clinical consultation, please call [phone]. ====================================================================    UDS interpretation: Compliant          Medication Assessment Form: Reviewed. Patient indicates being compliant with therapy Treatment compliance: Compliant Risk Assessment Profile: Aberrant behavior: See prior evaluations. None observed or detected today Comorbid factors increasing risk of overdose: See prior notes. No additional risks detected today Opioid risk tool (ORT) (Total Score): 0 Personal History of Substance Abuse (SUD-Substance use disorder):  Alcohol: Negative  Illegal Drugs: Negative  Rx Drugs: Negative  ORT Risk Level calculation: Low Risk Risk of substance use disorder (SUD): Low Opioid Risk Tool - 09/01/18 0926      Family History of Substance Abuse   Alcohol  Negative    Illegal Drugs  Negative    Rx Drugs  Negative      Personal History of Substance Abuse   Alcohol  Negative    Illegal Drugs  Negative    Rx Drugs  Negative      Age   Age between 56-45 years   No      History of Preadolescent Sexual Abuse   History of Preadolescent Sexual Abuse  Negative or Female      Psychological Disease   Psychological Disease  Negative   PTSD, Agorophobia, anxiety    Depression  Negative      Total Score   Opioid Risk Tool Scoring  0    Opioid Risk Interpretation  Low Risk      ORT Scoring interpretation table:  Score <3 = Low Risk for SUD  Score between 4-7 = Moderate Risk for SUD  Score >8 = High Risk for Opioid Abuse   Risk Mitigation Strategies:  Patient Counseling: Covered Patient-Prescriber Agreement (PPA): Present and active  Notification to other healthcare providers: Done  Pharmacologic Plan: No change in therapy, at this time.             Post-Procedure Assessment  07/02/2018 procedure: Trigger point injections Pre-procedure pain score:  8/10 Post-procedure pain score: 0/10         Influential Factors: BMI: 22.71 kg/m Intra-procedural challenges: None  observed.         Assessment challenges: None detected.              Reported side-effects: None.        Post-procedural adverse reactions or complications: None reported         Sedation: Please see nurses note. When no sedatives are used, the analgesic levels obtained are directly associated to the effectiveness of the local anesthetics. However, when sedation is provided, the level of analgesia obtained during the initial 1 hour following the intervention, is believed to be the result  of a combination of factors. These factors may include, but are not limited to: 1. The effectiveness of the local anesthetics used. 2. The effects of the analgesic(s) and/or anxiolytic(s) used. 3. The degree of discomfort experienced by the patient at the time of the procedure. 4. The patients ability and reliability in recalling and recording the events. 5. The presence and influence of possible secondary gains and/or psychosocial factors. Reported result: Relief experienced during the 1st hour after the procedure: 100 % (Ultra-Short Term Relief)            Interpretative annotation: Clinically appropriate result. Analgesia during this period is likely to be Local Anesthetic and/or IV Sedative (Analgesic/Anxiolytic) related.          Effects of local anesthetic: The analgesic effects attained during this period are directly associated to the localized infiltration of local anesthetics and therefore cary significant diagnostic value as to the etiological location, or anatomical origin, of the pain. Expected duration of relief is directly dependent on the pharmacodynamics of the local anesthetic used. Long-acting (4-6 hours) anesthetics used.  Reported result: Relief during the next 4 to 6 hour after the procedure: 100 % (Short-Term Relief)            Interpretative annotation: Clinically appropriate result. Analgesia during this period is likely to be Local Anesthetic-related.          Long-term benefit: Defined as  the period of time past the expected duration of local anesthetics (1 hour for short-acting and 4-6 hours for long-acting). With the possible exception of prolonged sympathetic blockade from the local anesthetics, benefits during this period are typically attributed to, or associated with, other factors such as analgesic sensory neuropraxia, antiinflammatory effects, or beneficial biochemical changes provided by agents other than the local anesthetics.  Reported result: Extended relief following procedure: 100 % (Long-Term Relief)            Interpretative annotation: Clinically possible results. Good relief. No permanent benefit expected. Inflammation plays a part in the etiology to the pain.          Current benefits: Defined as reported results that persistent at this point in time.   Analgesia: 75-100 % Tami Hopkins reports improvement of axial symptoms. Function: Tami Hopkins reports improvement in function ROM: Tami Hopkins reports improvement in ROM Interpretative annotation: Ongoing benefit.    Effective diagnostic intervention.          Interpretation: Results would suggest a successful diagnostic intervention.                  Plan:  Please see "Plan of Care" for details.                Laboratory Chemistry  Inflammation Markers (CRP: Acute Phase) (ESR: Chronic Phase) Lab Results  Component Value Date   LATICACIDVEN 1.1 05/11/2015                         Rheumatology Markers No results found for: RF, ANA, LABURIC, URICUR, LYMEIGGIGMAB, LYMEABIGMQN, HLAB27                      Renal Function Markers Lab Results  Component Value Date   BUN 11 05/14/2015   CREATININE 0.72 05/14/2015   GFRAA >60 05/14/2015   GFRNONAA >60 05/14/2015  Hepatic Function Markers Lab Results  Component Value Date   AST 170 (H) 05/14/2015   ALT 148 (H) 05/14/2015   ALBUMIN 3.3 (L) 05/14/2015   ALKPHOS 70 05/14/2015   AMMONIA 12 05/11/2015                         Electrolytes Lab Results  Component Value Date   NA 138 05/14/2015   K 3.7 05/14/2015   CL 105 05/14/2015   CALCIUM 8.6 (L) 05/14/2015   MG 2.0 05/11/2015                        Neuropathy Markers No results found for: VITAMINB12, FOLATE, HGBA1C, HIV                      CNS Tests No results found for: COLORCSF, APPEARCSF, RBCCOUNTCSF, WBCCSF, POLYSCSF, LYMPHSCSF, EOSCSF, PROTEINCSF, GLUCCSF, JCVIRUS, CSFOLI, IGGCSF                      Bone Pathology Markers No results found for: VD25OH, [id number], GY6599JT7, SV7793JQ3, 25OHVITD1, 25OHVITD2, 25OHVITD3, TESTOFREE, TESTOSTERONE                       Coagulation Parameters Lab Results  Component Value Date   PLT 223 05/11/2015                        Cardiovascular Markers Lab Results  Component Value Date   CKTOTAL 5,683 (H) 05/14/2015   TROPONINI <0.03 05/10/2015   HGB 12.1 05/11/2015   HCT 36.1 05/11/2015                         CA Markers No results found for: CEA, CA125, LABCA2                      Note: Lab results reviewed.  Recent Diagnostic Imaging Results  DG SCOLIOSIS EVAL COMPLETE SPINE 2 OR 3 VIEWS CLINICAL DATA:  Chronic low back pain.  Scoliosis.  EXAM: DG SCOLIOSIS EVAL COMPLETE SPINE 2-3V  COMPARISON:  None.  FINDINGS: Anterolisthesis C3-4 and C4-5. Disc degeneration and spurring at C5-6.  Thoracic spine mild degenerative change in the lower thoracic spine.  Lumbar dextroscoliosis at L1 measuring 39 degrees. Disc degeneration and spurring on the left at T12-L1, L1-2, L2-3, L3-4.  Negative for fracture in the spine.  IMPRESSION: 39 degrees of dextroscoliosis lumbar spine with degenerative change on the convexity of the scoliosis.  Cervical spondylosis most prominent at C5-6.  Electronically Signed   By: Franchot Gallo M.D.   On: 06/11/2018 10:14  Complexity Note: Imaging results reviewed. Results shared with Tami Hopkins, using [employer].                         Meds    Current Outpatient Medications:  .  baclofen (LIORESAL) 10 MG tablet, Take 1 tablet (10 mg total) by mouth 3 (three) times daily., Disp: 90 tablet, Rfl: 0 .  clonazePAM (KLONOPIN) 0.5 MG tablet, Take 0.25 mg by mouth daily. , Disp: , Rfl:  .  diphenoxylate-atropine (LOMOTIL) 2.5-0.025 MG per tablet, Take 1 tablet by mouth 4 (four) times daily as needed for diarrhea or loose stools., Disp: , Rfl:  .  estradiol (ESTRACE VAGINAL) 0.1 MG/GM vaginal cream, 1 gram  vaginally nightly at bedtime for 2 weeks,  then 1 gram vaginally twice weekly (Patient not taking: Reported on 09/01/2018), Disp: 42.5 g, Rfl: 0 .  FLOVENT HFA 110 MCG/ACT inhaler, Inhale 2 puffs into the lungs at bedtime. , Disp: , Rfl:  .  fluticasone (FLONASE) 50 MCG/ACT nasal spray, Place 1 spray into both nostrils 2 (two) times daily., Disp: , Rfl:  .  furosemide (LASIX) 40 MG tablet, Take 1 tablet by mouth as needed. , Disp: , Rfl:  .  ipratropium (ATROVENT) 0.06 % nasal spray, Place into the nose., Disp: , Rfl:  .  KLOR-CON 10 10 MEQ tablet, Take 10 mEq by mouth as needed. , Disp: , Rfl: 5 .  lubiprostone (AMITIZA) 24 MCG capsule, Take 1 capsule (24 mcg total) by mouth 2 (two) times daily with a meal., Disp: 60 capsule, Rfl: 3 .  montelukast (SINGULAIR) 10 MG tablet, Take 10 mg by mouth at bedtime., Disp: , Rfl:  .  oxyCODONE-acetaminophen (PERCOCET/ROXICET) 5-325 MG tablet, Take 1 tablet by mouth 2 (two) times daily., Disp: 60 tablet, Rfl: 0 .  PROAIR HFA 108 (90 BASE) MCG/ACT inhaler, Inhale 2 puffs into the lungs every 4 (four) hours as needed., Disp: , Rfl:  .  promethazine (PHENERGAN) 25 MG tablet, Take 50 mg by mouth as needed. , Disp: , Rfl:  .  risperiDONE (RISPERDAL) 1 MG tablet, Take 1 tablet (1 mg total) by mouth at bedtime. (Patient not taking: Reported on 03/24/2018), Disp: 30 tablet, Rfl: 2 .  traZODone (DESYREL) 50 MG tablet, Take 1 tablet by mouth daily., Disp: , Rfl:  .  ZETIA 10 MG tablet, Take 10 mg by mouth daily.,  Disp: , Rfl: 5 .  zolpidem (AMBIEN) 10 MG tablet, Take 10 mg by mouth at bedtime as needed for sleep., Disp: , Rfl:   ROS  Constitutional: Denies any fever or chills Gastrointestinal: No reported hemesis, hematochezia, vomiting, or acute GI distress Musculoskeletal: Denies any acute onset joint swelling, redness, loss of ROM, or weakness Neurological: No reported episodes of acute onset apraxia, aphasia, dysarthria, agnosia, amnesia, paralysis, loss of coordination, or loss of consciousness  Allergies  Tami Hopkins is allergic to penicillins; sulfa antibiotics; amitriptyline; bupropion; doxepin; moxifloxacin; and penicillin g.  PFSH  Drug: Tami Hopkins  reports that she does not use drugs. Alcohol:  reports that she does not drink alcohol. Tobacco:  reports that she has been smoking cigarettes. She started smoking about 23 years ago. She has been smoking about 0.25 packs per day. She has never used smokeless tobacco. Medical:  has a past medical history of Anxiety, Asthma, Bursitis of hip bilateral, Hyperlipidemia, PTSD (post-traumatic stress disorder), and Scoliosis. Surgical: Tami Hopkins  has a past surgical history that includes Cesarean section; Endometrial ablation; Abdominal hysterectomy; Nose surgery; and Cholecystectomy. Family: family history includes Alcohol abuse in her father; Arthritis in her mother; Asthma in her mother; Breast cancer in her maternal aunt; Depression in her mother; Diabetes in her sister; Endometriosis in her daughter and daughter; Heart attack in her father; Hypertension in her sister; Obesity in her sister.  Constitutional Exam  General appearance: Well nourished, well developed, and well hydrated. In no apparent acute distress Vitals:   09/01/18 0919  BP: 117/84  Pulse: 82  Temp: 98.6 F (37 C)  SpO2: 99%  Weight: 145 lb (65.8 kg)  Height: _0  (1.702 m)   BMI Assessment: Estimated body mass index is 22.71 kg/m as calculated from the following:  Height as of this encounter: _0  (1.702 m).   Weight as of this encounter: 145 lb (65.8 kg).  BMI interpretation table: BMI level Category Range association with higher incidence of chronic pain  <18 kg/m2 Underweight   18.5-24.9 kg/m2 Ideal body weight   25-29.9 kg/m2 Overweight Increased incidence by 20%  30-34.9 kg/m2 Obese (Class I) Increased incidence by 68%  35-39.9 kg/m2 Severe obesity (Class II) Increased incidence by 136%  >40 kg/m2 Extreme obesity (Class III) Increased incidence by 254%   Patient's current BMI Ideal Body weight  Body mass index is 22.71 kg/m. Ideal body weight: 61.6 kg (135 lb 12.9 oz) Adjusted ideal body weight: 63.3 kg (139 lb 7.7 oz)   BMI Readings from Last 4 Encounters:  09/01/18 22.71 kg/m  07/02/18 23.96 kg/m  04/09/18 24.28 kg/m  04/01/18 24.59 kg/m   Wt Readings from Last 4 Encounters:  09/01/18 145 lb (65.8 kg)  07/02/18 153 lb (69.4 kg)  04/09/18 155 lb (70.3 kg)  04/01/18 157 lb (71.2 kg)  Psych/Mental status: Alert, oriented x 3 (person, place, & time)       Eyes: PERLA Respiratory: No evidence of acute respiratory distress  Thoracic Spine Area Exam  Skin & Axial Inspection: No masses, redness, or swelling Alignment: Asymmetric Functional ROM: Unrestricted ROM Stability: No instability detected Muscle Tone/Strength: Functionally intact. No obvious neuro-muscular anomalies detected. Sensory (Neurological): Unimpaired Muscle strength & Tone: No palpable anomalies  Lumbar Spine Area Exam  Skin & Axial Inspection: No masses, redness, or swelling Alignment: Scoliosis detected Functional ROM: Unrestricted ROM       Stability: No instability detected Muscle Tone/Strength: Functionally intact. No obvious neuro-muscular anomalies detected. Sensory (Neurological): Unimpaired Palpation: No palpable anomalies         Gait & Posture Assessment  Ambulation: Unassisted Gait: Relatively normal for age and body habitus Posture: WNL    Lower Extremity Exam    Side: Right lower extremity  Side: Left lower extremity  Stability: No instability observed          Stability: No instability observed          Skin & Extremity Inspection: Skin color, temperature, and hair growth are WNL. No peripheral edema or cyanosis. No masses, redness, swelling, asymmetry, or associated skin lesions. No contractures.  Skin & Extremity Inspection: Skin color, temperature, and hair growth are WNL. No peripheral edema or cyanosis. No masses, redness, swelling, asymmetry, or associated skin lesions. No contractures.  Functional ROM: Unrestricted ROM                  Functional ROM: Unrestricted ROM                  Muscle Tone/Strength: Functionally intact. No obvious neuro-muscular anomalies detected.  Muscle Tone/Strength: Functionally intact. No obvious neuro-muscular anomalies detected.  Sensory (Neurological): Unimpaired  Sensory (Neurological): Unimpaired  Palpation: No palpable anomalies  Palpation: No palpable anomalies   Assessment  Primary Diagnosis & Pertinent Problem List: The primary encounter diagnosis was Chronic right-sided low back pain with right-sided sciatica. Diagnoses of Congenital scoliosis, Chronic hip pain, right, Chronic pain syndrome, and Long term prescription opiate use were also pertinent to this visit.  Status Diagnosis  Controlled Controlled Controlled 1. Chronic right-sided low back pain with right-sided sciatica   2. Congenital scoliosis   3. Chronic hip pain, right   4. Chronic pain syndrome   5. Long term prescription opiate use     Problems updated and reviewed during this  visit: Problem  Bad Taste in Mouth   Plan of Care  Pharmacotherapy (Medications Ordered): Meds ordered this encounter  Medications  . oxyCODONE-acetaminophen (PERCOCET/ROXICET) 5-325 MG tablet    Sig: Take 1 tablet by mouth 2 (two) times daily.    Dispense:  60 tablet    Refill:  0    Do not add this medication to the electronic  "Automatic Refill" notification system. Patient may have prescription filled one day early if pharmacy is closed on scheduled refill date.    Order Specific Question:   Supervising Provider    Answer:   Milinda Pointer [phone]  . baclofen (LIORESAL) 10 MG tablet    Sig: Take 1 tablet (10 mg total) by mouth 3 (three) times daily.    Dispense:  90 tablet    Refill:  0    Order Specific Question:   Supervising Provider    Answer:   Milinda Pointer [phone]   New Prescriptions   No medications on file   Medications administered today: Tami Hopkins had no medications administered during this visit. Lab-work, procedure(s), and/or referral(s): Orders Placed This Encounter  Procedures  . ToxASSURE Select 13 (MW), Urine   Imaging and/or referral(s): None  Interventional therapies: Planned, scheduled, and/or pending:   Not at this time.   Provider-requested follow-up: Return in about 4 weeks (around 09/29/2018) for w/ Dr. [name] medication adjustment.  Future Appointments  Date Time Provider [place]  09/02/2018 10:00 AM Stegmayer, Janalyn Harder, PA-C [facility] None  09/23/2018  2:00 PM [name] [place] Filbert, MD [facility] [place] None   Primary Care Physician: Barbaraann Boys, MD Location: [facility] Note by: Vevelyn Francois NP Date: 09/01/2018; Time: 1:43 PM  Pain Score Disclaimer: We use the NRS-11 scale. This is a self-reported, subjective measurement of pain severity with only modest accuracy. It is used primarily to identify changes within a particular patient. It must be understood that outpatient pain scales are significantly less accurate that those used for research, where they can be applied under ideal controlled circumstances with minimal exposure to variables. In reality, the score is likely to be a combination of pain intensity and pain affect, where pain affect describes the degree of emotional arousal or changes in action readiness caused by  the sensory experience of pain. Factors such as social and work situation, setting, emotional state, anxiety levels, expectation, and prior pain experience may influence pain perception and show large inter-individual differences that may also be affected by time variables.  Patient instructions provided during this appointment: Patient Instructions   baclofen (LIORESAL) 10 MG tablet and oxyCODONE-acetaminophen (PERCOCET/ROXICET) 5-325 MG tablet sent to your pharmacy and readu for pick up.Oxycodone to last for one month.  ____________________________________________________________________________________________  Medication Rules  Applies to: All patients receiving prescriptions (written or electronic).  Pharmacy of record: Pharmacy where electronic prescriptions will be sent. If written prescriptions are taken to a different pharmacy, please inform the nursing staff. The pharmacy listed in the electronic medical record should be the one where you would like electronic prescriptions to be sent.  Prescription refills: Only during scheduled appointments. Applies to both, written and electronic prescriptions.  NOTE: The following applies primarily to controlled substances (Opioid* Pain Medications).   Patient's responsibilities: 1. Pain Pills: Bring all pain pills to every appointment (except for procedure appointments). 2. Pill Bottles: Bring pills in original pharmacy bottle. Always bring newest bottle. Bring bottle, even if empty. 3. Medication refills: You are responsible for knowing and keeping track of  what medications you need refilled. The day before your appointment, write a list of all prescriptions that need to be refilled. Bring that list to your appointment and give it to the admitting nurse. Prescriptions will be written only during appointments. If you forget a medication, it will not be "Called in", "Faxed", or "electronically sent". You will need to get another appointment to get  these prescribed. 4. Prescription Accuracy: You are responsible for carefully inspecting your prescriptions before leaving our office. Have the discharge nurse carefully go over each prescription with you, before taking them home. Make sure that your name is accurately spelled, that your address is correct. Check the name and dose of your medication to make sure it is accurate. Check the number of pills, and the written instructions to make sure they are clear and accurate. Make sure that you are given enough medication to last until your next medication refill appointment. 5. Taking Medication: Take medication as prescribed. Never take more pills than instructed. Never take medication more frequently than prescribed. Taking less pills or less frequently is permitted and encouraged, when it comes to controlled substances (written prescriptions).  6. Inform other Doctors: Always inform, all of your healthcare providers, of all the medications you take. 7. Pain Medication from other Providers: You are not allowed to accept any additional pain medication from any other Doctor or Healthcare provider. There are two exceptions to this rule. (see below) In the event that you require additional pain medication, you are responsible for notifying us, as stated below. 8. Medication Agreement: You are responsible for carefully reading and following our Medication Agreement. This must be signed before receiving any prescriptions from our practice. Safely store a copy of your signed Agreement. Violations to the Agreement will result in no further prescriptions. (Additional copies of our Medication Agreement are available upon request.) 9. Laws, Rules, & Regulations: All patients are expected to follow all Federal and [employer], [employer], Rules, [employer]. Ignorance of the Laws does not constitute a valid excuse. The use of any illegal substances is prohibited. 10. Adopted CDC guidelines & recommendations: Target  dosing levels will be at or below 60 MME/day. Use of benzodiazepines** is not recommended.  Exceptions: There are only two exceptions to the rule of not receiving pain medications from other Healthcare Providers. 1. Exception #1 (Emergencies): In the event of an emergency (i.e.: accident requiring emergency care), you are allowed to receive additional pain medication. However, you are responsible for: As soon as you are able, call our office [phone], at any time of the day or night, and leave a message stating your name, the date and nature of the emergency, and the name and dose of the medication prescribed. In the event that your call is answered by a member of our staff, make sure to document and save the date, time, and the name of the person that took your information.  2. Exception #2 (Planned Surgery): In the event that you are scheduled by another doctor or dentist to have any type of surgery or procedure, you are allowed (for a period no longer than 30 days), to receive additional pain medication, for the acute post-op pain. However, in this case, you are responsible for picking up a copy of our "Post-op Pain Management for Surgeons" handout, and giving it to your surgeon or dentist. This document is available at our office, and does not require an appointment to obtain it. Simply go to our office during business hours (Monday-Thursday  from 8:00 AM to 4:00 PM) (Friday 8:00 AM to 12:00 Noon) or if you have a scheduled appointment with [place], prior to your surgery, and ask for it by name. In addition, you will need to provide [place] with your name, name of your surgeon, type of surgery, and date of procedure or surgery.  *Opioid medications include: morphine, codeine, oxycodone, oxymorphone, hydrocodone, hydromorphone, meperidine, tramadol, tapentadol, buprenorphine, fentanyl, methadone. **Benzodiazepine medications include: diazepam (Valium), alprazolam (Xanax), clonazepam (Klonopine), lorazepam  (Ativan), clorazepate (Tranxene), chlordiazepoxide (Librium), estazolam (Prosom), oxazepam (Serax), temazepam (Restoril), triazolam (Halcion) (Last updated: 12/19/2017) ____________________________________________________________________________________________    ______________________________________________________________________________________________  Specialty Pain Scale  Introduction:  There are significant differences in how pain is reported. The word pain usually refers to physical pain, but it is also a common synonym of suffering. The medical community uses a scale from 0 (zero) to 10 (ten) to report pain level. Zero (0) is described as "no pain", while ten (10) is described as "the worse pain you can imagine". The problem with this scale is that physical pain is reported along with suffering. Suffering refers to mental pain, or more often yet it refers to any unpleasant feeling, emotion or aversion associated with the perception of harm or threat of harm. It is the psychological component of pain.  Pain Specialists prefer to separate the two components. The pain scale used by this practice is the Verbal Numerical Rating Scale (VNRS-11). This scale is for the physical pain only. DO NOT INCLUDE how your pain psychologically affects you. This scale is for adults 54 years of age and older. It has 11 (eleven) levels. The 1st level is 0/10. This means: "right now, I have no pain". In the context of pain management, it also means: "right now, my physical pain is under control with the current therapy".  General Information:  The scale should reflect your current level of pain. Unless you are specifically asked for the level of your worst pain, or your average pain. If you are asked for one of these two, then it should be understood that it is over the past 24 hours.  Levels 1 (one) through 5 (five) are described below, and can be treated as an outpatient. Ambulatory pain management facilities  such as ours are more than adequate to treat these levels. Levels 6 (six) through 10 (ten) are also described below, however, these must be treated as a hospitalized patient. While levels 6 (six) and 7 (seven) may be evaluated at an urgent care facility, levels 8 (eight) through 10 (ten) constitute medical emergencies and as such, they belong in a hospital's emergency department. When having these levels (as described below), do not come to our office. Our facility is not equipped to manage these levels. Go directly to an urgent care facility or an emergency department to be evaluated.  Definitions:  Activities of Daily Living (ADL): Activities of daily living (ADL or ADLs) is a term used in healthcare to refer to people's daily self-care activities. Health professionals often use a person's ability or inability to perform ADLs as a measurement of their functional status, particularly in regard to people post injury, with disabilities and the elderly. There are two ADL levels: Basic and Instrumental. Basic Activities of Daily Living (BADL  or BADLs) consist of self-care tasks that include: Bathing and showering; personal hygiene and grooming (including brushing/combing/styling hair); dressing; Toilet hygiene (getting to the toilet, cleaning oneself, and getting back up); eating and self-feeding (not including cooking or chewing and swallowing); functional mobility,  often referred to as "transferring", as measured by the ability to walk, get in and out of bed, and get into and out of a chair; the broader definition (moving from one place to another while performing activities) is useful for people with different physical abilities who are still able to get around independently. Basic ADLs include the things many people do when they get up in the morning and get ready to go out of the house: get out of bed, go to the toilet, bathe, dress, groom, and eat. On the average, loss of function typically follows a  particular order. Hygiene is the first to go, followed by loss of toilet use and locomotion. The last to go is the ability to eat. When there is only one remaining area in which the person is independent, there is a 62.9% chance that it is eating and only a 3.5% chance that it is hygiene. Instrumental Activities of Daily Living (IADL or IADLs) are not necessary for fundamental functioning, but they let an individual live independently in a community. IADL consist of tasks that include: cleaning and maintaining the house; home establishment and maintenance; care of others (including selecting and supervising caregivers); care of pets; child rearing; managing money; managing financials (investments, etc.); meal preparation and cleanup; shopping for groceries and necessities; moving within the community; safety procedures and emergency responses; health management and maintenance (taking prescribed medications); and using the telephone or other form of communication.  Instructions:  Most patients tend to report their pain as a combination of two factors, their physical pain and their psychosocial pain. This last one is also known as "suffering" and it is reflection of how physical pain affects you socially and psychologically. From now on, report them separately.  From this point on, when asked to report your pain level, report only your physical pain. Use the following table for reference.  Pain Clinic Pain Levels (0-5/10)  Pain Level Score  Description  No Pain 0   Mild pain 1 Nagging, annoying, but does not interfere with basic activities of daily living (ADL). Patients are able to eat, bathe, get dressed, toileting (being able to get on and off the toilet and perform personal hygiene functions), transfer (move in and out of bed or a chair without assistance), and maintain continence (able to control bladder and bowel functions). Blood pressure and heart rate are unaffected. A normal heart rate for a  healthy adult ranges from 60 to 100 bpm (beats per minute).   Mild to moderate pain 2 Noticeable and distracting. Impossible to hide from other people. More frequent flare-ups. Still possible to adapt and function close to normal. It can be very annoying and may have occasional stronger flare-ups. With discipline, patients may get used to it and adapt.   Moderate pain 3 Interferes significantly with activities of daily living (ADL). It becomes difficult to feed, bathe, get dressed, get on and off the toilet or to perform personal hygiene functions. Difficult to get in and out of bed or a chair without assistance. Very distracting. With effort, it can be ignored when deeply involved in activities.   Moderately severe pain 4 Impossible to ignore for more than a few minutes. With effort, patients may still be able to manage work or participate in some social activities. Very difficult to concentrate. Signs of autonomic nervous system discharge are evident: dilated pupils (mydriasis); mild sweating (diaphoresis); sleep interference. Heart rate becomes elevated (>115 bpm). Diastolic blood pressure (lower number) rises above 100 mmHg.  Patients find relief in laying down and not moving.   Severe pain 5 Intense and extremely unpleasant. Associated with frowning face and frequent crying. Pain overwhelms the senses.  Ability to do any activity or maintain social relationships becomes significantly limited. Conversation becomes difficult. Pacing back and forth is common, as getting into a comfortable position is nearly impossible. Pain wakes you up from deep sleep. Physical signs will be obvious: pupillary dilation; increased sweating; goosebumps; brisk reflexes; cold, clammy hands and feet; nausea, vomiting or dry heaves; loss of appetite; significant sleep disturbance with inability to fall asleep or to remain asleep. When persistent, significant weight loss is observed due to the complete loss of appetite and sleep  deprivation.  Blood pressure and heart rate becomes significantly elevated. Caution: If elevated blood pressure triggers a pounding headache associated with blurred vision, then the patient should immediately seek attention at an urgent or emergency care unit, as these may be signs of an impending stroke.    Emergency Department Pain Levels (6-10/10)  Emergency Room Pain 6 Severely limiting. Requires emergency care and should not be seen or managed at an outpatient pain management facility. Communication becomes difficult and requires great effort. Assistance to reach the emergency department may be required. Facial flushing and profuse sweating along with potentially dangerous increases in heart rate and blood pressure will be evident.   Distressing pain 7 Self-care is very difficult. Assistance is required to transport, or use restroom. Assistance to reach the emergency department will be required. Tasks requiring coordination, such as bathing and getting dressed become very difficult.   Disabling pain 8 Self-care is no longer possible. At this level, pain is disabling. The individual is unable to do even the most "basic" activities such as walking, eating, bathing, dressing, transferring to a bed, or toileting. Fine motor skills are lost. It is difficult to think clearly.   Incapacitating pain 9 Pain becomes incapacitating. Thought processing is no longer possible. Difficult to remember your own name. Control of movement and coordination are lost.   The worst pain imaginable 10 At this level, most patients pass out from pain. When this level is reached, collapse of the autonomic nervous system occurs, leading to a sudden drop in blood pressure and heart rate. This in turn results in a temporary and dramatic drop in blood flow to the brain, leading to a loss of consciousness. Fainting is one of the body's self defense mechanisms. Passing out puts the brain in a calmed state and causes it to shut down  for a while, in order to begin the healing process.    Summary: 1. Refer to this scale when providing [place] with your pain level. 2. Be accurate and careful when reporting your pain level. This will help with your care. 3. Over-reporting your pain level will lead to loss of credibility. 4. Even a level of 1/10 means that there is pain and will be treated at our facility. 5. High, inaccurate reporting will be documented as "Symptom Exaggeration", leading to loss of credibility and suspicions of possible secondary gains such as obtaining more narcotics, or wanting to appear disabled, for fraudulent reasons. 6. Only pain levels of 5 or below will be seen at our facility. 7. Pain levels of 6 and above will be sent to the Emergency Department and the appointment cancelled. ______________________________________________________________________________________________

## 2018-09-02 ENCOUNTER — Encounter (INDEPENDENT_AMBULATORY_CARE_PROVIDER_SITE_OTHER): Payer: Medicare Other | Admitting: Vascular Surgery

## 2018-09-05 LAB — TOXASSURE SELECT 13 (MW), URINE

## 2018-09-23 ENCOUNTER — Encounter: Payer: Self-pay | Admitting: Anesthesiology

## 2018-09-23 ENCOUNTER — Other Ambulatory Visit: Payer: Self-pay

## 2018-09-23 ENCOUNTER — Ambulatory Visit: Payer: Medicare Other | Attending: Anesthesiology | Admitting: Anesthesiology

## 2018-09-23 VITALS — BP 113/83 | HR 87 | Temp 98.3°F | Resp 18 | Ht 67.0 in | Wt 144.0 lb

## 2018-09-23 DIAGNOSIS — Z760 Encounter for issue of repeat prescription: Secondary | ICD-10-CM | POA: Insufficient documentation

## 2018-09-23 DIAGNOSIS — Z79891 Long term (current) use of opiate analgesic: Secondary | ICD-10-CM | POA: Diagnosis not present

## 2018-09-23 DIAGNOSIS — Z79899 Other long term (current) drug therapy: Secondary | ICD-10-CM | POA: Insufficient documentation

## 2018-09-23 DIAGNOSIS — M5441 Lumbago with sciatica, right side: Secondary | ICD-10-CM

## 2018-09-23 DIAGNOSIS — Q675 Congenital deformity of spine: Secondary | ICD-10-CM | POA: Insufficient documentation

## 2018-09-23 DIAGNOSIS — G894 Chronic pain syndrome: Secondary | ICD-10-CM | POA: Diagnosis not present

## 2018-09-23 DIAGNOSIS — M25551 Pain in right hip: Secondary | ICD-10-CM

## 2018-09-23 DIAGNOSIS — M5136 Other intervertebral disc degeneration, lumbar region: Secondary | ICD-10-CM | POA: Diagnosis not present

## 2018-09-23 DIAGNOSIS — G8929 Other chronic pain: Secondary | ICD-10-CM

## 2018-09-23 DIAGNOSIS — M50322 Other cervical disc degeneration at C5-C6 level: Secondary | ICD-10-CM | POA: Insufficient documentation

## 2018-09-23 DIAGNOSIS — F1190 Opioid use, unspecified, uncomplicated: Secondary | ICD-10-CM

## 2018-09-23 MED ORDER — OXYCODONE-ACETAMINOPHEN 5-325 MG PO TABS: 1.0000 | ORAL_TABLET | Freq: Three times a day (TID) | ORAL | 0 refills | Status: DC

## 2018-09-23 MED ORDER — BACLOFEN 10 MG PO TABS: 10.0000 mg | ORAL_TABLET | Freq: Three times a day (TID) | ORAL | 0 refills | Status: DC

## 2018-09-23 NOTE — Addendum Note (Signed)
Addended by: Landis Martins on: 09/23/2018 02:42 PM   Modules accepted: Orders

## 2018-09-23 NOTE — Progress Notes (Signed)
Subjective:  Patient ID: Tami Hopkins, female    DOB: 1961/07/06  Age: 57 y.o. MRN: [id number]  CC: Medication Refill (Oxycodone )   Procedure: None  HPI [name] presents for reevaluation.  She was last seen several weeks ago and continues to have some lower back pain hip pain and periodic neck pain of the same quality characteristic distribution.  She has been seen by Dr. Cari Caraway in the past and has been found to be a nonsurgical candidate as of last January.  The quality of the pain has been similar in nature with no significant changes noted at this time.  She is taking her medications as prescribed and these are working well for her.  She takes Vicodin 5 mg tablets twice a day and this generally keeps her pain under good control but over the past month to 2 months she has have been having more breakthrough pain.  Otherwise no other changes are reported.  She is still getting some occasional radicular pain going into the legs as previously reported.  She has had epidural steroid injections for these in the past.  They have helped significantly.  She is failed conservative therapy with [facility] therapy modalities alone.  Outpatient Medications Prior to Visit  Medication Sig Dispense Refill  . clonazePAM (KLONOPIN) 0.5 MG tablet Take 0.25 mg by mouth daily.     . diphenoxylate-atropine (LOMOTIL) 2.5-0.025 MG per tablet Take 1 tablet by mouth 4 (four) times daily as needed for diarrhea or loose stools.    Marland Kitchen FLOVENT HFA 110 MCG/ACT inhaler Inhale 2 puffs into the lungs at bedtime.     . fluticasone (FLONASE) 50 MCG/ACT nasal spray Place 1 spray into both nostrils 2 (two) times daily.    Marland Kitchen ipratropium (ATROVENT) 0.06 % nasal spray Place into the nose.    Marland Kitchen KLOR-CON 10 10 MEQ tablet Take 10 mEq by mouth as needed.   5  . lubiprostone (AMITIZA) 24 MCG capsule Take 1 capsule (24 mcg total) by mouth 2 (two) times daily with a meal. 60 capsule 3  . montelukast (SINGULAIR) 10 MG tablet  Take 10 mg by mouth at bedtime.    Marland Kitchen PROAIR HFA 108 (90 BASE) MCG/ACT inhaler Inhale 2 puffs into the lungs every 4 (four) hours as needed.    . promethazine (PHENERGAN) 25 MG tablet Take 50 mg by mouth as needed.     . risperiDONE (RISPERDAL) 1 MG tablet Take 1 tablet (1 mg total) by mouth at bedtime. 30 tablet 2  . zolpidem (AMBIEN) 10 MG tablet Take 10 mg by mouth at bedtime as needed for sleep.    . baclofen (LIORESAL) 10 MG tablet Take 1 tablet (10 mg total) by mouth 3 (three) times daily. 90 tablet 0  . oxyCODONE-acetaminophen (PERCOCET/ROXICET) 5-325 MG tablet Take 1 tablet by mouth 2 (two) times daily. 60 tablet 0  . estradiol (ESTRACE VAGINAL) 0.1 MG/GM vaginal cream 1 gram vaginally nightly at bedtime for 2 weeks,  then 1 gram vaginally twice weekly (Patient not taking: Reported on 09/23/2018) 42.5 g 0  . furosemide (LASIX) 40 MG tablet Take 1 tablet by mouth as needed.     Marland Kitchen ZETIA 10 MG tablet Take 10 mg by mouth daily.  5   No facility-administered medications prior to visit.     Review of Systems CNS: No confusion or sedation Cardiac: No angina or palpitations GI: No abdominal pain or constipation Constitutional: No nausea vomiting fevers or chills  Objective:  BP  113/83   Pulse 87   Temp 98.3 F (36.8 C)   Resp 18   Ht 5\' 7"  (1.702 m)   Wt 144 lb (65.3 kg)   SpO2 97%   BMI 22.55 kg/m    BP Readings from Last 3 Encounters:  09/23/18 113/83  09/01/18 117/84  07/02/18 108/86     Wt Readings from Last 3 Encounters:  09/23/18 144 lb (65.3 kg)  09/01/18 145 lb (65.8 kg)  07/02/18 153 lb (69.4 kg)     Physical Exam Pt is alert and oriented PERRL EOMI HEART IS RRR no murmur or rub LCTA no wheezing or rales MUSCULOSKELETAL reveals good muscle tone and bulk to the lower extremities.  She does have a positive straight leg raise right side negative right and she does experience more groin and hip pain than on previous examination.  Labs  No results found for:  HGBA1C Lab Results  Component Value Date   CREATININE 0.72 05/14/2015    -------------------------------------------------------------------------------------------------------------------- Lab Results  Component Value Date   WBC 12.0 (H) 05/11/2015   HGB 12.1 05/11/2015   HCT 36.1 05/11/2015   PLT 223 05/11/2015   GLUCOSE 118 (H) 05/14/2015   ALT 148 (H) 05/14/2015   AST 170 (H) 05/14/2015   NA 138 05/14/2015   K 3.7 05/14/2015   CL 105 05/14/2015   CREATININE 0.72 05/14/2015   BUN 11 05/14/2015   CO2 25 05/14/2015   TSH 0.912 05/11/2015    --------------------------------------------------------------------------------------------------------------------- Dg Scoliosis Eval Complete Spine 2 Or 3 Views  Result Date: 06/11/2018 CLINICAL DATA:  Chronic low back pain.  Scoliosis. EXAM: DG SCOLIOSIS EVAL COMPLETE SPINE 2-3V COMPARISON:  None. FINDINGS: Anterolisthesis C3-4 and C4-5. Disc degeneration and spurring at C5-6. Thoracic spine mild degenerative change in the lower thoracic spine. Lumbar dextroscoliosis at L1 measuring 39 degrees. Disc degeneration and spurring on the left at T12-L1, L1-2, L2-3, L3-4. Negative for fracture in the spine. IMPRESSION: 39 degrees of dextroscoliosis lumbar spine with degenerative change on the convexity of the scoliosis. Cervical spondylosis most prominent at C5-6. Electronically Signed   By: Franchot Gallo M.D.   On: 06/11/2018 10:14     Assessment & Plan:   Allison was seen today for medication refill.  Diagnoses and all orders for this visit:  Chronic right-sided low back pain with right-sided sciatica -     Lumbar Epidural Injection; Future  Congenital scoliosis  Chronic hip pain, right -     Lumbar Epidural Injection; Future  Chronic pain syndrome  DDD (degenerative disc disease), lumbar -     Lumbar Epidural Injection; Future  Chronic, continuous use of opioids  Other orders -     Discontinue: oxyCODONE-acetaminophen  (PERCOCET/ROXICET) 5-325 MG tablet; Take 1 tablet by mouth 3 (three) times daily. -     oxyCODONE-acetaminophen (PERCOCET/ROXICET) 5-325 MG tablet; Take 1 tablet by mouth 3 (three) times daily. -     baclofen (LIORESAL) 10 MG tablet; Take 1 tablet (10 mg total) by mouth 3 (three) times daily.        ----------------------------------------------------------------------------------------------------------------------  Problem List Items Addressed This Visit      Unprioritized   Chronic hip pain, right (Chronic)   Relevant Medications   oxyCODONE-acetaminophen (PERCOCET/ROXICET) 5-325 MG tablet   baclofen (LIORESAL) 10 MG tablet   Other Relevant Orders   Lumbar Epidural Injection   Chronic pain syndrome (Chronic)   Chronic right-sided low back pain with right-sided sciatica - Primary (Chronic)   Relevant Medications   oxyCODONE-acetaminophen (  PERCOCET/ROXICET) 5-325 MG tablet   baclofen (LIORESAL) 10 MG tablet   Other Relevant Orders   Lumbar Epidural Injection   Congenital scoliosis (Chronic)    Other Visit Diagnoses    DDD (degenerative disc disease), lumbar       Relevant Medications   oxyCODONE-acetaminophen (PERCOCET/ROXICET) 5-325 MG tablet   baclofen (LIORESAL) 10 MG tablet   Other Relevant Orders   Lumbar Epidural Injection   Chronic, continuous use of opioids            ----------------------------------------------------------------------------------------------------------------------  1. Chronic right-sided low back pain with right-sided sciatica She is getting a bit more sciatica-like symptoms and in the past she has had epidural injections.  These were done at L5-S1 however I think that a 3 4 approach might be more effective to help with some of the bilateral hip pain that she is experiencing.  Unfortunately she has failed conservative therapy and is a nonsurgical candidate.  We have gone over the risks and benefits of this and will consider this in 2 months at  her next visit. - Lumbar Epidural Injection; Future  2. Congenital scoliosis As above  3. Chronic hip pain, right As above - Lumbar Epidural Injection; Future  4. Chronic pain syndrome We will keep her on her current regimen today with refills given for December 11 and January 10 of next year.  I have reviewed the [facility] practitioner database information and it is appropriate.  5. DDD (degenerative disc disease), lumbar As above - Lumbar Epidural Injection; Future  6. Chronic, continuous use of opioids As above    ----------------------------------------------------------------------------------------------------------------------  I am having Tami Hopkins maintain her diphenoxylate-atropine, montelukast, furosemide, fluticasone, PROAIR HFA, ZETIA, KLOR-CON 10, promethazine, FLOVENT HFA, risperiDONE, zolpidem, clonazePAM, ipratropium, estradiol, lubiprostone, oxyCODONE-acetaminophen, and baclofen.   Meds ordered this encounter  Medications  . DISCONTD: oxyCODONE-acetaminophen (PERCOCET/ROXICET) 5-325 MG tablet    Sig: Take 1 tablet by mouth 3 (three) times daily.    Dispense:  75 tablet    Refill:  0    Do not add this medication to the electronic "Automatic Refill" notification system. Do not fill until [id number]  . oxyCODONE-acetaminophen (PERCOCET/ROXICET) 5-325 MG tablet    Sig: Take 1 tablet by mouth 3 (three) times daily.    Dispense:  75 tablet    Refill:  0    Do not add this medication to the electronic "Automatic Refill" notification system. Do not fill until [id number]  . baclofen (LIORESAL) 10 MG tablet    Sig: Take 1 tablet (10 mg total) by mouth 3 (three) times daily.    Dispense:  90 tablet    Refill:  0   Patient's Medications  New Prescriptions   No medications on file  Previous Medications   CLONAZEPAM (KLONOPIN) 0.5 MG TABLET    Take 0.25 mg by mouth daily.    DIPHENOXYLATE-ATROPINE (LOMOTIL) 2.5-0.025 MG PER TABLET    Take 1 tablet by mouth  4 (four) times daily as needed for diarrhea or loose stools.   ESTRADIOL (ESTRACE VAGINAL) 0.1 MG/GM VAGINAL CREAM    1 gram vaginally nightly at bedtime for 2 weeks,  then 1 gram vaginally twice weekly   FLOVENT HFA 110 MCG/ACT INHALER    Inhale 2 puffs into the lungs at bedtime.    FLUTICASONE (FLONASE) 50 MCG/ACT NASAL SPRAY    Place 1 spray into both nostrils 2 (two) times daily.   FUROSEMIDE (LASIX) 40 MG TABLET    Take 1 tablet by mouth as  needed.    IPRATROPIUM (ATROVENT) 0.06 % NASAL SPRAY    Place into the nose.   KLOR-CON 10 10 MEQ TABLET    Take 10 mEq by mouth as needed.    LUBIPROSTONE (AMITIZA) 24 MCG CAPSULE    Take 1 capsule (24 mcg total) by mouth 2 (two) times daily with a meal.   MONTELUKAST (SINGULAIR) 10 MG TABLET    Take 10 mg by mouth at bedtime.   PROAIR HFA 108 (90 BASE) MCG/ACT INHALER    Inhale 2 puffs into the lungs every 4 (four) hours as needed.   PROMETHAZINE (PHENERGAN) 25 MG TABLET    Take 50 mg by mouth as needed.    RISPERIDONE (RISPERDAL) 1 MG TABLET    Take 1 tablet (1 mg total) by mouth at bedtime.   ZETIA 10 MG TABLET    Take 10 mg by mouth daily.   ZOLPIDEM (AMBIEN) 10 MG TABLET    Take 10 mg by mouth at bedtime as needed for sleep.  Modified Medications   Modified Medication Previous Medication   BACLOFEN (LIORESAL) 10 MG TABLET baclofen (LIORESAL) 10 MG tablet      Take 1 tablet (10 mg total) by mouth 3 (three) times daily.    Take 1 tablet (10 mg total) by mouth 3 (three) times daily.   OXYCODONE-ACETAMINOPHEN (PERCOCET/ROXICET) 5-325 MG TABLET oxyCODONE-acetaminophen (PERCOCET/ROXICET) 5-325 MG tablet      Take 1 tablet by mouth 3 (three) times daily.    Take 1 tablet by mouth 2 (two) times daily.  Discontinued Medications   No medications on file   ----------------------------------------------------------------------------------------------------------------------  Follow-up: Return in about 2 months (around 11/24/2018) for evaluation, procedure.     Molli Barrows, MD

## 2018-09-23 NOTE — Progress Notes (Signed)
Nursing Pain Medication Assessment:  Safety precautions to be maintained throughout the outpatient stay will include: orient to surroundings, keep bed in low position, maintain call bell within reach at all times, provide assistance with transfer out of bed and ambulation.  Medication Inspection Compliance: Pill count conducted under aseptic conditions, in front of the patient. Neither the pills nor the bottle was removed from the patient's sight at any time. Once count was completed pills were immediately returned to the patient in their original bottle.  Medication: Oxycodone/APAP Pill/Patch Count: 16 of 60 pills remain Pill/Patch Appearance: Markings consistent with prescribed medication Bottle Appearance: Standard pharmacy container. Clearly labeled. Filled Date: 57 / 11 / 2019 Last Medication intake:  Today

## 2018-09-23 NOTE — Patient Instructions (Addendum)
You have been given 2 prescriptions for Oxycodone-acetamin. To last until 11/30/2018.   ____________________________________________________________________________________________  Preparing for your procedure (without sedation)  Instructions: . Oral Intake: Do not eat or drink anything for at least 3 hours prior to your procedure. . Transportation: Unless otherwise stated by your physician, you may drive yourself after the procedure. . Blood Pressure Medicine: Take your blood pressure medicine with a sip of water the morning of the procedure. . Blood thinners: Notify our staff if you are taking any blood thinners. Depending on which one you take, there will be specific instructions on how and when to stop it. . Diabetics on insulin: Notify the staff so that you can be scheduled 1st case in the morning. If your diabetes requires high dose insulin, take only  of your normal insulin dose the morning of the procedure and notify the staff that you have done so. . Preventing infections: Shower with an antibacterial soap the morning of your procedure.  . Build-up your immune system: Take 1000 mg of Vitamin C with every meal (3 times a day) the day prior to your procedure. Marland Kitchen Antibiotics: Inform the staff if you have a condition or reason that requires you to take antibiotics before dental procedures. . Pregnancy: If you are pregnant, call and cancel the procedure. . Sickness: If you have a cold, fever, or any active infections, call and cancel the procedure. . Arrival: You must be in the facility at least 30 minutes prior to your scheduled procedure. . Children: Do not bring any children with you. . Dress appropriately: Bring dark clothing that you would not mind if they get stained. . Valuables: Do not bring any jewelry or valuables.  Procedure appointments are reserved for interventional treatments only. Marland Kitchen No Prescription Refills. . No medication changes will be discussed during procedure  appointments. . No disability issues will be discussed.  Reasons to call and reschedule or cancel your procedure: (Following these recommendations will minimize the risk of a serious complication.) . Surgeries: Avoid having procedures within 2 weeks of any surgery. (Avoid for 2 weeks before or after any surgery). . Flu Shots: Avoid having procedures within 2 weeks of a flu shots or . (Avoid for 2 weeks before or after immunizations). . Barium: Avoid having a procedure within 7-10 days after having had a radiological study involving the use of radiological contrast. (Myelograms, Barium swallow or enema study). . Heart attacks: Avoid any elective procedures or surgeries for the initial 6 months after a "Myocardial Infarction" (Heart Attack). . Blood thinners: It is imperative that you stop these medications before procedures. Let us know if you if you take any blood thinner.  . Infection: Avoid procedures during or within two weeks of an infection (including chest colds or gastrointestinal problems). Symptoms associated with infections include: Localized redness, fever, chills, night sweats or profuse sweating, burning sensation when voiding, cough, congestion, stuffiness, runny nose, sore throat, diarrhea, nausea, vomiting, cold or Flu symptoms, recent or current infections. It is specially important if the infection is over the area that we intend to treat. Marland Kitchen Heart and lung problems: Symptoms that may suggest an active cardiopulmonary problem include: cough, chest pain, breathing difficulties or shortness of breath, dizziness, ankle swelling, uncontrolled high or unusually low blood pressure, and/or palpitations. If you are experiencing any of these symptoms, cancel your procedure and contact your primary care physician for an evaluation.  Remember:  Regular Business hours are:  Monday to Thursday 8:00 AM to 4:00 PM  Provider's Schedule: Milinda Pointer, MD:  Procedure days: Tuesday and Thursday  7:30 AM to 4:00 PM  Gillis Santa, MD:  Procedure days: Monday and Wednesday 7:30 AM to 4:00 PM ____________________________________________________________________________________________  Epidural Steroid Injection Patient Information  Description: The epidural space surrounds the nerves as they exit the spinal cord.  In some patients, the nerves can be compressed and inflamed by a bulging disc or a tight spinal canal (spinal stenosis).  By injecting steroids into the epidural space, we can bring irritated nerves into direct contact with a potentially helpful medication.  These steroids act directly on the irritated nerves and can reduce swelling and inflammation which often leads to decreased pain.  Epidural steroids may be injected anywhere along the spine and from the neck to the low back depending upon the location of your pain.   After numbing the skin with local anesthetic (like Novocaine), a small needle is passed into the epidural space slowly.  You may experience a sensation of pressure while this is being done.  The entire block usually last less than 10 minutes.  Conditions which may be treated by epidural steroids:   Low back and leg pain  Neck and arm pain  Spinal stenosis  Post-laminectomy syndrome  Herpes zoster (shingles) pain  Pain from compression fractures  Preparation for the injection:  1. Do not eat any solid food or dairy products within 8 hours of your appointment.  2. You may drink clear liquids up to 3 hours before appointment.  Clear liquids include water, black coffee, juice or soda.  No milk or cream please. 3. You may take your regular medication, including pain medications, with a sip of water before your appointment  Diabetics should hold regular insulin (if taken separately) and take 1/2 normal NPH dos the morning of the procedure.  Carry some sugar containing items with you to your appointment. 4. A driver must accompany you and be prepared to drive  you home after your procedure.  5. Bring all your current medications with your. 6. An IV may be inserted and sedation may be given at the discretion of the physician.   7. A blood pressure cuff, EKG and other monitors will often be applied during the procedure.  Some patients may need to have extra oxygen administered for a short period. 8. You will be asked to provide medical information, including your allergies, prior to the procedure.  We must know immediately if you are taking blood thinners (like Coumadin/Warfarin)  Or if you are allergic to IV iodine contrast (dye). We must know if you could possible be pregnant.  Possible side-effects:  Bleeding from needle site  Infection (rare, may require surgery)  Nerve injury (rare)  Numbness & tingling (temporary)  Difficulty urinating (rare, temporary)  Spinal headache ( a headache worse with upright posture)  Light -headedness (temporary)  Pain at injection site (several days)  Decreased blood pressure (temporary)  Weakness in arm/leg (temporary)  Pressure sensation in back/neck (temporary)  Call if you experience:  Fever/chills associated with headache or increased back/neck pain.  Headache worsened by an upright position.  New onset weakness or numbness of an extremity below the injection site  Hives or difficulty breathing (go to the emergency room)  Inflammation or drainage at the infection site  Severe back/neck pain  Any new symptoms which are concerning to you  Please note:  Although the local anesthetic injected can often make your back or neck feel good for several hours after the  injection, the pain will likely return.  It takes 3-7 days for steroids to work in the epidural space.  You may not notice any pain relief for at least that one week.  If effective, we will often do a series of three injections spaced 3-6 weeks apart to maximally decrease your pain.  After the initial series, we generally will wait  several months before considering a repeat injection of the same type.  If you have any questions, please call [phone] [facility]

## 2018-09-29 ENCOUNTER — Encounter (INDEPENDENT_AMBULATORY_CARE_PROVIDER_SITE_OTHER): Payer: Medicare Other | Admitting: Vascular Surgery

## 2018-09-29 ENCOUNTER — Other Ambulatory Visit: Payer: Self-pay | Admitting: Internal Medicine

## 2018-11-26 ENCOUNTER — Encounter: Payer: Self-pay | Admitting: Nurse Practitioner

## 2018-11-26 ENCOUNTER — Ambulatory Visit: Payer: Medicare HMO | Attending: Nurse Practitioner | Admitting: Nurse Practitioner

## 2018-11-26 VITALS — BP 105/77 | HR 86 | Temp 98.9°F | Resp 16 | Ht 67.0 in | Wt 149.0 lb

## 2018-11-26 DIAGNOSIS — M545 Low back pain, unspecified: Secondary | ICD-10-CM

## 2018-11-26 DIAGNOSIS — M542 Cervicalgia: Secondary | ICD-10-CM | POA: Insufficient documentation

## 2018-11-26 DIAGNOSIS — M5441 Lumbago with sciatica, right side: Secondary | ICD-10-CM | POA: Diagnosis present

## 2018-11-26 DIAGNOSIS — M51369 Other intervertebral disc degeneration, lumbar region without mention of lumbar back pain or lower extremity pain: Secondary | ICD-10-CM

## 2018-11-26 DIAGNOSIS — G894 Chronic pain syndrome: Secondary | ICD-10-CM | POA: Diagnosis present

## 2018-11-26 DIAGNOSIS — M25551 Pain in right hip: Secondary | ICD-10-CM | POA: Diagnosis present

## 2018-11-26 DIAGNOSIS — M5136 Other intervertebral disc degeneration, lumbar region: Secondary | ICD-10-CM | POA: Insufficient documentation

## 2018-11-26 DIAGNOSIS — G8929 Other chronic pain: Secondary | ICD-10-CM | POA: Diagnosis present

## 2018-11-26 MED ORDER — BACLOFEN 10 MG PO TABS: 10.0000 mg | ORAL_TABLET | Freq: Three times a day (TID) | ORAL | 1 refills | Status: DC

## 2018-11-26 MED ORDER — GABAPENTIN 300 MG PO CAPS: 300.0000 mg | ORAL_CAPSULE | Freq: Four times a day (QID) | ORAL | 1 refills | Status: DC

## 2018-11-26 MED ORDER — OXYCODONE-ACETAMINOPHEN 5-325 MG PO TABS: 1.0000 | ORAL_TABLET | Freq: Three times a day (TID) | ORAL | 0 refills | Status: DC | PRN

## 2018-11-26 MED ORDER — OXYCODONE-ACETAMINOPHEN 5-325 MG PO TABS: 1.0000 | ORAL_TABLET | Freq: Three times a day (TID) | ORAL | 0 refills | Status: AC | PRN

## 2018-11-26 NOTE — Progress Notes (Signed)
Nursing Pain Medication Assessment:  Safety precautions to be maintained throughout the outpatient stay will include: orient to surroundings, keep bed in low position, maintain call bell within reach at all times, provide assistance with transfer out of bed and ambulation.  Medication Inspection Compliance: Pill count conducted under aseptic conditions, in front of the patient. Neither the pills nor the bottle was removed from the patient's sight at any time. Once count was completed pills were immediately returned to the patient in their original bottle.  Medication: Oxycodone/APAP Pill/Patch Count: 18 of 75 pills remain Pill/Patch Appearance: Markings consistent with prescribed medication Bottle Appearance: Standard pharmacy container. Clearly labeled. Filled Date: 01 / 11 / 2020 Last Medication intake:  Yesterday

## 2018-11-26 NOTE — Progress Notes (Signed)
Patient's Name: Tami Hopkins  MRN: [id number]  Referring Provider: Barbaraann Boys, MD  DOB: 1961/03/30  PCP: Barbaraann Boys, MD  DOS: 11/26/2018  Note by: Vevelyn Francois NP  Service setting: Ambulatory outpatient  Specialty: Interventional Pain Management  Location: [facility] (AMB) Pain Management Facility    Patient type: Established    Primary Reason(s) for Visit: Encounter for prescription drug management. (Level of risk: moderate)  CC: Back Pain (mid thoracic to lumbar ); Shoulder Pain (bilateral); and Hip Pain (bilateral bursitis )  HPI  Tami Hopkins is a 58 y.o. year old, female patient, who comes today for a medication management evaluation. She has Rhabdomyolysis; Encephalopathy acute; Delirium, drug-induced ([place]); Posttraumatic stress disorder; Chronic obstructive pulmonary disease ([place]); Endometriosis; 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); Neurosis, posttraumatic; Chronic allergic rhinitis due to animal hair and dander; Chronic hip pain, right; Chronic nausea; Chronic right-sided low back pain with right-sided sciatica; Congenital scoliosis; Gastroesophageal reflux disease with esophagitis; High cholesterol; Irritable bowel syndrome with both constipation and diarrhea; Moderate persistent asthma without complication; Overweight (BMI 25.0-29.9); Tobacco use disorder; Trigger finger of right hand; Post-traumatic stress disorder; Chronic pain syndrome; Bad taste in mouth; DDD (degenerative disc disease), lumbar; and Cervicalgia on their problem list. Her primarily concern today is the Back Pain (mid thoracic to lumbar ); Shoulder Pain (bilateral); and Hip Pain (bilateral bursitis )  Pain Assessment: Location: Mid, Lower, Left, Right Back Radiating: back pain goes into hips and legs  Onset: More than a month ago Duration: Chronic pain Quality: Spasm, Sharp, Burning, Constant(lower back is sometimes burning and sometimes sharp, nauseating pain at times it feels like the back is catching  and when the pain goes down into the leg, the leg gives out and feels like she may fall ) Severity: 8 /10 (subjective, self-reported pain score)  Note: Reported level is compatible with observation. Clinically the patient looks like a 2/10 A 2/10 is viewed as "Mild to Moderate" and described as noticeable and distracting. Impossible to hide from other people. More frequent flare-ups. Still possible to adapt and function close to normal. It can be very annoying and may have occasional stronger flare-ups. With discipline, patients may get used to it and adapt. Information on the proper use of the pain scale provided to the patient today. When using our objective Pain Scale, levels between 6 and 10/10 are said to belong in an emergency room, as it progressively worsens from a 6/10, described as severely limiting, requiring emergency care not usually available at an outpatient pain management facility. At a 6/10 level, communication becomes difficult and requires great effort. Assistance to reach the emergency department may be required. Facial flushing and profuse sweating along with potentially dangerous increases in heart rate and blood pressure will be evident. Effect on ADL: keeps going and pushes through but activity is still limited.  Timing: Constant Modifying factors: manipulations and stretching, medications BP: 105/77  HR: 86  Tami Hopkins was last scheduled for an appointment on 09/01/2018 for medication management. During today's appointment we reviewed Tami Hopkins's chronic pain status, as well as her outpatient medication regimen.  She is currently taking gabapentin 100 mg nightly.  She gets this from her psychiatrist for anxiety.  He does not feel like this current dose is effective.  She does have burning type pains that goes down into her legs.  She admits that she has used more the gabapentin on occasions.  She is unable to use it at night because it causes her  heart rate to increase but is  okay during the day.  She would like to have this adjusted.  The patient  reports no history of drug use. Her body mass index is 23.34 kg/m.  Further details on both, my assessment(s), as well as the proposed treatment plan, please see below.  Controlled Substance Pharmacotherapy Assessment REMS (Risk Evaluation and Mitigation Strategy)  Analgesic: Oxycodone/acetaminophen 5/325 1 tablet up to 3 times daily MME/day: 22.5 mg/day.  Janett Billow, RN  11/26/2018  9:54 AM  Sign when Signing Visit Nursing Pain Medication Assessment:  Safety precautions to be maintained throughout the outpatient stay will include: orient to surroundings, keep bed in low position, maintain call bell within reach at all times, provide assistance with transfer out of bed and ambulation.  Medication Inspection Compliance: Pill count conducted under aseptic conditions, in front of the patient. Neither the pills nor the bottle was removed from the patient's sight at any time. Once count was completed pills were immediately returned to the patient in their original bottle.  Medication: Oxycodone/APAP Pill/Patch Count: 18 of 75 pills remain Pill/Patch Appearance: Markings consistent with prescribed medication Bottle Appearance: Standard pharmacy container. Clearly labeled. Filled Date: 01 / 11 / 2020 Last Medication intake:  Yesterday   Pharmacokinetics: Liberation and absorption (onset of action): WNL Distribution (time to peak effect): WNL Metabolism and excretion (duration of action): WNL         Pharmacodynamics: Desired effects: Analgesia: Tami Hopkins reports >50% benefit. Functional ability: Patient reports that medication allows her to accomplish basic ADLs Clinically meaningful improvement in function (CMIF): Sustained CMIF goals met Perceived effectiveness: Described as relatively effective, allowing for increase in activities of daily living (ADL) Undesirable effects: Side-effects or Adverse  reactions: None reported Monitoring: [place] PMP: Online review of the past 57-monthperiod conducted. Compliant with practice rules and regulations Last UDS on record: Summary  Date Value Ref Range Status  09/01/2018 FINAL  Final    Comment:    ==================================================================== TOXASSURE SELECT 13 (MW) ==================================================================== Test                             Result       Flag       Units Drug Present and Declared for Prescription Verification   7-aminoclonazepam              167          EXPECTED   ng/mg creat    7-aminoclonazepam is an expected metabolite of clonazepam. Source    of clonazepam is a scheduled prescription medication.   Oxymorphone                    151          EXPECTED   ng/mg creat   Noroxycodone                   819          EXPECTED   ng/mg creat    Oxymorphone and noroxycodone are expected metabolites of    oxycodone. Sources of oxycodone are scheduled prescription    medications. Oxymorphone is also available as a scheduled    prescription medication. Drug Present not Declared for Prescription Verification   Alpha-hydroxyalprazolam        95           UNEXPECTED ng/mg creat    Alpha-hydroxyalprazolam is an expected metabolite of alprazolam.  Source of alprazolam is a scheduled prescription medication. Drug Absent but Declared for Prescription Verification   Oxycodone                      Not Detected UNEXPECTED ng/mg creat    Oxycodone is almost always present in patients taking this drug    consistently.  Absence of oxycodone could be due to lapse of time    since the last dose or unusual pharmacokinetics (rapid    metabolism). ==================================================================== Test                      Result    Flag   Units      Ref Range   Creatinine              43               mg/dL       >=20 ==================================================================== Declared Medications:  The flagging and interpretation on this report are based on the  following declared medications.  Unexpected results may arise from  inaccuracies in the declared medications.  **Note: The testing scope of this panel includes these medications:  Clonazepam  Oxycodone (Percocet)  **Note: The testing scope of this panel does not include following  reported medications:  Acetaminophen (Percocet)  Albuterol  Atropine  Baclofen  Diphenhydramine  Estradiol (Estrace)  Ezetimibe (Zetia)  Fluticasone  Fluticasone (Flonase)  Furosemide  Ipratropium  Lubiprostone (Amitiza)  Montelukast  Potassium  Promethazine  Risperidone (Risperdal)  Trazodone  Zolpidem ==================================================================== For clinical consultation, please call [phone]. ====================================================================    UDS interpretation: Compliant          Medication Assessment Form: Reviewed. Patient indicates being compliant with therapy Treatment compliance: Compliant Risk Assessment Profile: Aberrant behavior: See prior evaluations. None observed or detected today Comorbid factors increasing risk of overdose: See prior notes. No additional risks detected today Opioid risk tool (ORT) (Total Score): (P) 3 Personal History of Substance Abuse (SUD-Substance use disorder):  Alcohol: (P) Negative  Illegal Drugs: (P) Negative  Rx Drugs: (P) Negative  ORT Risk Level calculation: (P) Low Risk Risk of substance use disorder (SUD): Moderate Opioid Risk Tool - 11/26/18 1045      Family History of Substance Abuse   Alcohol  Negative  (Pended)     Illegal Drugs  Negative  (Pended)     Rx Drugs  Negative  (Pended)       Personal History of Substance Abuse   Alcohol  Negative  (Pended)     Illegal Drugs  Negative  (Pended)     Rx Drugs  Negative  (Pended)        Psychological Disease   Psychological Disease  Positive  (Pended)     ADD  Negative  (Pended)     OCD  Negative  (Pended)     Bipolar  Negative  (Pended)     Schizophrenia  Negative  (Pended)     Depression  Positive  (Pended)    PTSD, anxiety, panic attacks     Total Score   Opioid Risk Tool Scoring  3  (Pended)     Opioid Risk Interpretation  Low Risk  (Pended)       ORT Scoring interpretation table:  Score <3 = Low Risk for SUD  Score between 4-7 = Moderate Risk for SUD  Score >8 = High Risk for Opioid Abuse   Risk Mitigation Strategies:  Patient Counseling: Covered  Patient-Prescriber Agreement (PPA): Present and active  Notification to other healthcare providers: Done  Pharmacologic Plan: No change in therapy, at this time.             Laboratory Chemistry  Inflammation Markers (CRP: Acute Phase) (ESR: Chronic Phase) Lab Results  Component Value Date   LATICACIDVEN 1.1 05/11/2015                         Rheumatology Markers No results found for: RF, ANA, LABURIC, URICUR, LYMEIGGIGMAB, LYMEABIGMQN, HLAB27                      Renal Function Markers Lab Results  Component Value Date   BUN 11 05/14/2015   CREATININE 0.72 05/14/2015   GFRAA >60 05/14/2015   GFRNONAA >60 05/14/2015                             Hepatic Function Markers Lab Results  Component Value Date   AST 170 (H) 05/14/2015   ALT 148 (H) 05/14/2015   ALBUMIN 3.3 (L) 05/14/2015   ALKPHOS 70 05/14/2015   AMMONIA 12 05/11/2015                        Electrolytes Lab Results  Component Value Date   NA 138 05/14/2015   K 3.7 05/14/2015   CL 105 05/14/2015   CALCIUM 8.6 (L) 05/14/2015   MG 2.0 05/11/2015                        Neuropathy Markers No results found for: VITAMINB12, FOLATE, HGBA1C, HIV                      CNS Tests No results found for: COLORCSF, APPEARCSF, RBCCOUNTCSF, WBCCSF, POLYSCSF, LYMPHSCSF, EOSCSF, PROTEINCSF, GLUCCSF, JCVIRUS, CSFOLI, IGGCSF                       Bone Pathology Markers No results found for: VD25OH, [id number], [id number], [id number], 25OHVITD1, 25OHVITD2, 25OHVITD3, TESTOFREE, TESTOSTERONE                       Coagulation Parameters Lab Results  Component Value Date   PLT 223 05/11/2015                        Cardiovascular Markers Lab Results  Component Value Date   CKTOTAL 5,683 (H) 05/14/2015   TROPONINI <0.03 05/10/2015   HGB 12.1 05/11/2015   HCT 36.1 05/11/2015                         CA Markers No results found for: CEA, CA125, LABCA2                      Endocrine Markers Lab Results  Component Value Date   TSH 0.912 05/11/2015                        Note: Lab results reviewed.  Recent Diagnostic Imaging Results  DG SCOLIOSIS EVAL COMPLETE SPINE 2 OR 3 VIEWS CLINICAL DATA:  Chronic low back pain.  Scoliosis.  EXAM: DG SCOLIOSIS EVAL COMPLETE SPINE 2-3V  COMPARISON:  None.  FINDINGS: Anterolisthesis C3-4 and C4-5. Disc degeneration  and spurring at C5-6.  Thoracic spine mild degenerative change in the lower thoracic spine.  Lumbar dextroscoliosis at L1 measuring 39 degrees. Disc degeneration and spurring on the left at T12-L1, L1-2, L2-3, L3-4.  Negative for fracture in the spine.  IMPRESSION: 39 degrees of dextroscoliosis lumbar spine with degenerative change on the convexity of the scoliosis.  Cervical spondylosis most prominent at C5-6.  Electronically Signed   By: Franchot Gallo M.D.   On: 06/11/2018 10:14  Complexity Note: Imaging results reviewed. Results shared with Tami Hopkins, using [employer].                         Meds   Current Outpatient Medications:  .  clonazePAM (KLONOPIN) 0.5 MG tablet, Take 0.25 mg by mouth daily. , Disp: , Rfl:  .  diphenoxylate-atropine (LOMOTIL) 2.5-0.025 MG per tablet, Take 1 tablet by mouth 4 (four) times daily as needed for diarrhea or loose stools., Disp: , Rfl:  .  FLOVENT HFA 110 MCG/ACT inhaler, Inhale 2 puffs into the lungs at  bedtime. , Disp: , Rfl:  .  fluticasone (FLONASE) 50 MCG/ACT nasal spray, Place 1 spray into both nostrils 2 (two) times daily., Disp: , Rfl:  .  furosemide (LASIX) 40 MG tablet, Take 1 tablet by mouth as needed. , Disp: , Rfl:  .  ipratropium (ATROVENT) 0.06 % nasal spray, Place into the nose., Disp: , Rfl:  .  KLOR-CON 10 10 MEQ tablet, Take 10 mEq by mouth as needed. , Disp: , Rfl: 5 .  montelukast (SINGULAIR) 10 MG tablet, Take 10 mg by mouth at bedtime., Disp: , Rfl:  .  PROAIR HFA 108 (90 BASE) MCG/ACT inhaler, Inhale 2 puffs into the lungs every 4 (four) hours as needed., Disp: , Rfl:  .  promethazine (PHENERGAN) 25 MG tablet, Take 50 mg by mouth as needed. , Disp: , Rfl:  .  risperiDONE (RISPERDAL) 1 MG tablet, Take 1 tablet (1 mg total) by mouth at bedtime., Disp: 30 tablet, Rfl: 2 .  zolpidem (AMBIEN) 10 MG tablet, Take 10 mg by mouth at bedtime as needed for sleep., Disp: , Rfl:  .  baclofen (LIORESAL) 10 MG tablet, Take 1 tablet (10 mg total) by mouth 3 (three) times daily., Disp: 90 tablet, Rfl: 1 .  gabapentin (NEURONTIN) 300 MG capsule, Take 1 capsule (300 mg total) by mouth 4 (four) times daily., Disp: 120 capsule, Rfl: 1 .  [START ON 12/31/2018] oxyCODONE-acetaminophen (PERCOCET/ROXICET) 5-325 MG tablet, Take 1 tablet by mouth 3 (three) times daily as needed for up to 30 days for severe pain., Disp: 75 tablet, Rfl: 0 .  [START ON 12/01/2018] oxyCODONE-acetaminophen (PERCOCET/ROXICET) 5-325 MG tablet, Take 1 tablet by mouth 3 (three) times daily as needed for up to 30 days for severe pain., Disp: 75 tablet, Rfl: 0  ROS  Constitutional: Denies any fever or chills Gastrointestinal: No reported hemesis, hematochezia, vomiting, or acute GI distress Musculoskeletal: Denies any acute onset joint swelling, redness, loss of ROM, or weakness Neurological: No reported episodes of acute onset apraxia, aphasia, dysarthria, agnosia, amnesia, paralysis, loss of coordination, or loss of  consciousness  Allergies  Tami Hopkins is allergic to penicillins; sulfa antibiotics; amitriptyline; bupropion; doxepin; moxifloxacin; and penicillin g.  PFSH  Drug: Tami Hopkins  reports no history of drug use. Alcohol:  reports no history of alcohol use. Tobacco:  reports that she has been smoking cigarettes. She started smoking about 23 years ago. She  has been smoking about 0.25 packs per day. She has never used smokeless tobacco. Medical:  has a past medical history of Anxiety, Asthma, Bursitis of hip bilateral, Hyperlipidemia, PTSD (post-traumatic stress disorder), and Scoliosis. Surgical: Tami Hopkins  has a past surgical history that includes Cesarean section; Endometrial ablation; Abdominal hysterectomy; Nose surgery; and Cholecystectomy. Family: family history includes Alcohol abuse in her father; Arthritis in her mother; Asthma in her mother; Breast cancer in her maternal aunt; Depression in her mother; Diabetes in her sister; Endometriosis in her daughter and daughter; Heart attack in her father; Hypertension in her sister; Obesity in her sister.  Constitutional Exam  General appearance: Well nourished, well developed, and well hydrated. In no apparent acute distress Vitals:   11/26/18 0939  BP: 105/77  Pulse: 86  Resp: 16  Temp: 98.9 F (37.2 C)  TempSrc: Oral  SpO2: 98%  Weight: 149 lb (67.6 kg)  Height: 5' 7"  (1.702 m)  Psych/Mental status: Alert, oriented x 3 (person, place, & time)       Eyes: PERLA Respiratory: No evidence of acute respiratory distress  Cervical Spine Area Exam  Skin & Axial Inspection: No masses, redness, edema, swelling, or associated skin lesions Alignment: Symmetrical Functional ROM: Unrestricted ROM      Stability: No instability detected Muscle Tone/Strength: Functionally intact. No obvious neuro-muscular anomalies detected. Sensory (Neurological): Unimpaired Palpation: No palpable anomalies              Upper Extremity (UE) Exam     Side: Right upper extremity  Side: Left upper extremity  Skin & Extremity Inspection: Skin color, temperature, and hair growth are WNL. No peripheral edema or cyanosis. No masses, redness, swelling, asymmetry, or associated skin lesions. No contractures.  Skin & Extremity Inspection: Skin color, temperature, and hair growth are WNL. No peripheral edema or cyanosis. No masses, redness, swelling, asymmetry, or associated skin lesions. No contractures.  Functional ROM: Unrestricted ROM          Functional ROM: Unrestricted ROM          Muscle Tone/Strength: Functionally intact. No obvious neuro-muscular anomalies detected.  Muscle Tone/Strength: Functionally intact. No obvious neuro-muscular anomalies detected.  Sensory (Neurological): Unimpaired          Sensory (Neurological): Unimpaired          Palpation: No palpable anomalies              Palpation: No palpable anomalies                   Thoracic Spine Area Exam  Skin & Axial Inspection: No masses, redness, or swelling Alignment: Symmetrical Functional ROM: Unrestricted ROM Stability: No instability detected Muscle Tone/Strength: Functionally intact. No obvious neuro-muscular anomalies detected. Sensory (Neurological): Unimpaired Muscle strength & Tone: No palpable anomalies  Lumbar Spine Area Exam  Skin & Axial Inspection: No masses, redness, or swelling Alignment: Symmetrical Functional ROM: Unrestricted ROM       Stability: No instability detected Muscle Tone/Strength: Functionally intact. No obvious neuro-muscular anomalies detected. Sensory (Neurological): Unimpaired Palpation: No palpable anomalies       Provocative Tests: Hyperextension/rotation test: deferred today       Lumbar quadrant test (Kemp's test): deferred today       Lateral bending test: deferred today       Patrick's Maneuver: deferred today                    Gait & Posture Assessment  Ambulation: Unassisted Gait: Antalgic  Posture: Antalgic   Lower  Extremity Exam    Side: Right lower extremity  Side: Left lower extremity  Stability: No instability observed          Stability: No instability observed          Skin & Extremity Inspection: Skin color, temperature, and hair growth are WNL. No peripheral edema or cyanosis. No masses, redness, swelling, asymmetry, or associated skin lesions. No contractures.  Skin & Extremity Inspection: Skin color, temperature, and hair growth are WNL. No peripheral edema or cyanosis. No masses, redness, swelling, asymmetry, or associated skin lesions. No contractures.  Functional ROM: Unrestricted ROM                  Functional ROM: Unrestricted ROM                  Muscle Tone/Strength: Functionally intact. No obvious neuro-muscular anomalies detected.  Muscle Tone/Strength: Functionally intact. No obvious neuro-muscular anomalies detected.  Sensory (Neurological): Dermatomal pain pattern        Sensory (Neurological): Unimpaired            Palpation: No palpable anomalies  Palpation: No palpable anomalies   Assessment  Primary Diagnosis & Pertinent Problem List: The primary encounter diagnosis was Chronic hip pain, right. Diagnoses of Chronic right-sided low back pain with right-sided sciatica, Chronic pain syndrome, DDD (degenerative disc disease), lumbar, Cervicalgia, and Back pain at L4-L5 level were also pertinent to this visit.  Status Diagnosis  Controlled Worsening Controlled 1. Chronic hip pain, right   2. Chronic right-sided low back pain with right-sided sciatica   3. Chronic pain syndrome   4. DDD (degenerative disc disease), lumbar   5. Cervicalgia   6. Back pain at L4-L5 level     Problems updated and reviewed during this visit: Problem  Ddd (Degenerative Disc Disease), Lumbar  Cervicalgia   Plan of Care  Pharmacotherapy (Medications Ordered): Meds ordered this encounter  Medications  . baclofen (LIORESAL) 10 MG tablet    Sig: Take 1 tablet (10 mg total) by mouth 3 (three) times  daily.    Dispense:  90 tablet    Refill:  1    Order Specific Question:   Supervising Provider    Answer:   Molli Barrows [[id number]]  . oxyCODONE-acetaminophen (PERCOCET/ROXICET) 5-325 MG tablet    Sig: Take 1 tablet by mouth 3 (three) times daily as needed for up to 30 days for severe pain.    Dispense:  75 tablet    Refill:  0    Do not place this medication, or any other prescription from our practice, on "Automatic Refill". Patient may have prescription filled one day early if pharmacy is closed on scheduled refill date.    Order Specific Question:   Supervising Provider    Answer:   Molli Barrows [[id number]]  . oxyCODONE-acetaminophen (PERCOCET/ROXICET) 5-325 MG tablet    Sig: Take 1 tablet by mouth 3 (three) times daily as needed for up to 30 days for severe pain.    Dispense:  75 tablet    Refill:  0    Do not place this medication, or any other prescription from our practice, on "Automatic Refill". Patient may have prescription filled one day early if pharmacy is closed on scheduled refill date.    Order Specific Question:   Supervising Provider    Answer:   Molli Barrows [[id number]]  . gabapentin (NEURONTIN) 300 MG capsule    Sig: Take 1  capsule (300 mg total) by mouth 4 (four) times daily.    Dispense:  120 capsule    Refill:  1    Do not place this medication, or any other prescription from our practice, on "Automatic Refill". Patient may have prescription filled one day early if pharmacy is closed on scheduled refill date.    Order Specific Question:   Supervising Provider    Answer:   Molli Barrows [[id number]]   New Prescriptions   GABAPENTIN (NEURONTIN) 300 MG CAPSULE    Take 1 capsule (300 mg total) by mouth 4 (four) times daily.   Medications administered today: [name] had no medications administered during this visit. Lab-work, procedure(s), and/or referral(s): No orders of the defined types were placed in this encounter.  Imaging and/or  referral(s): None  Interventional therapies: Planned, scheduled, and/or pending:   Right sided lumbar epidural steroid injection  Provider-requested follow-up: Return in about 2 months (around 01/25/2019) for MedMgmt, Appointment As Scheduled, w/ Dr. [name].  Future Appointments  Date Time Provider [place]  12/18/2018 12:00 PM Molli Barrows, MD [facility] None  01/19/2019  1:15 PM [name] Alvina Filbert, MD [facility] None   Primary Care Physician: Barbaraann Boys, MD Location: [facility] Management [facility] Note by: Vevelyn Francois NP Date: 11/26/2018; Time: 3:51 PM  Pain Score Disclaimer: We use the NRS-11 scale. This is a self-reported, subjective measurement of pain severity with only modest accuracy. It is used primarily to identify changes within a particular patient. It must be understood that outpatient pain scales are significantly less accurate that those used for research, where they can be applied under ideal controlled circumstances with minimal exposure to variables. In reality, the score is likely to be a combination of pain intensity and pain affect, where pain affect describes the degree of emotional arousal or changes in action readiness caused by the sensory experience of pain. Factors such as social and work situation, setting, emotional state, anxiety levels, expectation, and prior pain experience may influence pain perception and show large inter-individual differences that may also be affected by time variables.  Patient instructions provided during this appointment: Patient Instructions    Baclofen and and gabapentin 300 mg escribed to pharmacy  Oxycodone - apap 5-325 mg x 2 months escribed to begin filling on 12/01/18 and 12/31/18

## 2018-11-26 NOTE — Patient Instructions (Addendum)
   Baclofen and and gabapentin 300 mg escribed to pharmacy  Oxycodone - apap 5-325 mg x 2 months escribed to begin filling on 12/01/18 and 12/31/18

## 2018-11-27 ENCOUNTER — Ambulatory Visit: Payer: Medicare Other | Admitting: Anesthesiology

## 2018-12-18 ENCOUNTER — Ambulatory Visit: Payer: Medicare HMO | Admitting: Anesthesiology

## 2018-12-20 ENCOUNTER — Other Ambulatory Visit: Payer: Self-pay | Admitting: Nurse Practitioner

## 2019-01-19 ENCOUNTER — Encounter: Payer: Medicare HMO | Admitting: Anesthesiology

## 2019-01-19 ENCOUNTER — Ambulatory Visit: Payer: Medicare HMO | Attending: Anesthesiology | Admitting: Anesthesiology

## 2019-01-19 ENCOUNTER — Encounter: Payer: Self-pay | Admitting: Anesthesiology

## 2019-01-19 ENCOUNTER — Other Ambulatory Visit: Payer: Self-pay

## 2019-01-19 DIAGNOSIS — M542 Cervicalgia: Secondary | ICD-10-CM

## 2019-01-19 DIAGNOSIS — M25551 Pain in right hip: Secondary | ICD-10-CM

## 2019-01-19 DIAGNOSIS — F1190 Opioid use, unspecified, uncomplicated: Secondary | ICD-10-CM

## 2019-01-19 DIAGNOSIS — Q675 Congenital deformity of spine: Secondary | ICD-10-CM

## 2019-01-19 DIAGNOSIS — M5441 Lumbago with sciatica, right side: Secondary | ICD-10-CM | POA: Diagnosis not present

## 2019-01-19 DIAGNOSIS — G8929 Other chronic pain: Secondary | ICD-10-CM

## 2019-01-19 DIAGNOSIS — F1394 Sedative, hypnotic or anxiolytic use, unspecified with sedative, hypnotic or anxiolytic-induced mood disorder: Secondary | ICD-10-CM

## 2019-01-19 DIAGNOSIS — M5432 Sciatica, left side: Secondary | ICD-10-CM

## 2019-01-19 DIAGNOSIS — M4697 Unspecified inflammatory spondylopathy, lumbosacral region: Secondary | ICD-10-CM

## 2019-01-19 DIAGNOSIS — G894 Chronic pain syndrome: Secondary | ICD-10-CM | POA: Diagnosis not present

## 2019-01-19 DIAGNOSIS — M5431 Sciatica, right side: Secondary | ICD-10-CM

## 2019-01-19 DIAGNOSIS — M4120 Other idiopathic scoliosis, site unspecified: Secondary | ICD-10-CM

## 2019-01-19 DIAGNOSIS — M5136 Other intervertebral disc degeneration, lumbar region: Secondary | ICD-10-CM | POA: Diagnosis not present

## 2019-01-19 MED ORDER — OXYCODONE-ACETAMINOPHEN 5-325 MG PO TABS: 1.0000 | ORAL_TABLET | Freq: Three times a day (TID) | ORAL | 0 refills | Status: DC | PRN

## 2019-01-19 MED ORDER — BACLOFEN 10 MG PO TABS: 10.0000 mg | ORAL_TABLET | Freq: Three times a day (TID) | ORAL | 3 refills | Status: DC

## 2019-01-19 MED ORDER — GABAPENTIN 300 MG PO CAPS: 300.0000 mg | ORAL_CAPSULE | Freq: Four times a day (QID) | ORAL | 1 refills | Status: DC

## 2019-01-19 NOTE — Progress Notes (Signed)
Virtual Visit via Telephone Note  I connected with Tami Hopkins on 01/19/19 at  1:15 PM EDT by telephone and verified that I am speaking with the correct person using two identifiers.   I discussed the limitations, risks, security and privacy concerns of performing an evaluation and management service by telephone and the availability of in person appointments. I also discussed with the patient that there may be a patient responsible charge related to this service. The patient expressed understanding and agreed to proceed.   History of Present Illness: I spoke to Tami Hopkins today over phone regarding her chronic pain.  The quality characteristic distribution of her low back pain has been stable in nature she still having significant lumbar pain with radiating pain into the bilateral calves and feet.  This is essentially been unchanged in quality characteristic or severity and she is taking her opioid medications in addition to the Neurontin and baclofen as scheduled.  She continues to have spasming in the low back and is quiring about a possible injection at her next visit.  Based on the conversation and her previous narcotic assessment sheet she is still doing well with her medications with good relief and no side effects noted.    Observations/Objective:   Assessment and Plan: 1. Chronic hip pain, right   2. Chronic right-sided low back pain with right-sided sciatica   3. Chronic pain syndrome   4. DDD (degenerative disc disease), lumbar   5. Cervicalgia   6. Congenital scoliosis   7. Chronic, continuous use of opioids   8. Bilateral sciatica   9. Scoliosis (and kyphoscoliosis), idiopathic   As above.  We have reviewed the [facility] practitioner database information and we will refill her opioids for April 10 and May 10.  We we will schedule her for return to clinic in 2 months and a trigger point injection at that time.   Follow Up Instructions:    I discussed the  assessment and treatment plan with the patient. The patient was provided an opportunity to ask questions and all were answered. The patient agreed with the plan and demonstrated an understanding of the instructions.   The patient was advised to call back or seek an in-person evaluation if the symptoms worsen or if the condition fails to improve as anticipated.  I provided 20 minutes of non-face-to-face time during this encounter.   Molli Barrows, MD

## 2019-03-25 ENCOUNTER — Ambulatory Visit: Payer: Medicare HMO | Attending: Anesthesiology | Admitting: Anesthesiology

## 2019-03-25 ENCOUNTER — Encounter: Payer: Self-pay | Admitting: Anesthesiology

## 2019-03-25 ENCOUNTER — Other Ambulatory Visit: Payer: Self-pay

## 2019-03-25 DIAGNOSIS — M5441 Lumbago with sciatica, right side: Secondary | ICD-10-CM | POA: Diagnosis not present

## 2019-03-25 DIAGNOSIS — F1190 Opioid use, unspecified, uncomplicated: Secondary | ICD-10-CM

## 2019-03-25 DIAGNOSIS — Q675 Congenital deformity of spine: Secondary | ICD-10-CM | POA: Diagnosis not present

## 2019-03-25 DIAGNOSIS — G894 Chronic pain syndrome: Secondary | ICD-10-CM

## 2019-03-25 DIAGNOSIS — M5432 Sciatica, left side: Secondary | ICD-10-CM

## 2019-03-25 DIAGNOSIS — M5431 Sciatica, right side: Secondary | ICD-10-CM

## 2019-03-25 DIAGNOSIS — G8929 Other chronic pain: Secondary | ICD-10-CM

## 2019-03-25 DIAGNOSIS — M4120 Other idiopathic scoliosis, site unspecified: Secondary | ICD-10-CM

## 2019-03-25 DIAGNOSIS — M25551 Pain in right hip: Secondary | ICD-10-CM

## 2019-03-25 DIAGNOSIS — M542 Cervicalgia: Secondary | ICD-10-CM

## 2019-03-25 MED ORDER — GABAPENTIN 300 MG PO CAPS: 300.0000 mg | ORAL_CAPSULE | Freq: Four times a day (QID) | ORAL | 3 refills | Status: DC

## 2019-03-25 MED ORDER — OXYCODONE-ACETAMINOPHEN 5-325 MG PO TABS: 1.0000 | ORAL_TABLET | Freq: Three times a day (TID) | ORAL | 0 refills | Status: DC | PRN

## 2019-03-25 MED ORDER — BACLOFEN 10 MG PO TABS: 10.0000 mg | ORAL_TABLET | Freq: Three times a day (TID) | ORAL | 3 refills | Status: DC

## 2019-03-25 MED ORDER — OXYCODONE-ACETAMINOPHEN 5-325 MG PO TABS: 1.0000 | ORAL_TABLET | Freq: Three times a day (TID) | ORAL | 0 refills | Status: AC | PRN

## 2019-03-25 NOTE — Progress Notes (Signed)
Virtual Visit via Telephone Note  I connected with Tami Hopkins on 03/25/19 at 12:30 PM EDT by telephone and verified that I am speaking with the correct person using two identifiers.  Location: Patient: Home Provider: Pain control center   I discussed the limitations, risks, security and privacy concerns of performing an evaluation and management service by telephone and the availability of in person appointments. I also discussed with the patient that there may be a patient responsible charge related to this service. The patient expressed understanding and agreed to proceed.   History of Present Illness: I spoke with Tami Hopkins today regarding her care.  We were unable to obtain a video voice conferencing and spoke via the telephone.  She mentions that she is having some increased radicular pain in the lower calves.  She continues to have chronic low back pain and neck pain of the same quality characteristic and distribution as previously documented.  This pain syndrome has been stable and she is still taking her medications as prescribed.  She is using gabapentin 3 times a day and baclofen for muscle spasms.  She is also using her opioids effectively.  These seem to be working well for her and she continues to derive good functional benefit from the medications.  She is doing stretching exercises but this is ineffective.  In the past she has had epidural steroids and these have been ineffective as well.  Otherwise she is in her usual state of health this point.   Observations/Objective:  Current Outpatient Medications:  .  baclofen (LIORESAL) 10 MG tablet, Take 1 tablet (10 mg total) by mouth 3 (three) times daily for 30 days., Disp: 90 tablet, Rfl: 3 .  clonazePAM (KLONOPIN) 0.5 MG tablet, Take 0.25 mg by mouth daily. , Disp: , Rfl:  .  diphenoxylate-atropine (LOMOTIL) 2.5-0.025 MG per tablet, Take 1 tablet by mouth 4 (four) times daily as needed for diarrhea or loose stools., Disp: ,  Rfl:  .  FLOVENT HFA 110 MCG/ACT inhaler, Inhale 2 puffs into the lungs at bedtime. , Disp: , Rfl:  .  fluticasone (FLONASE) 50 MCG/ACT nasal spray, Place 1 spray into both nostrils 2 (two) times daily., Disp: , Rfl:  .  furosemide (LASIX) 40 MG tablet, Take 1 tablet by mouth as needed. , Disp: , Rfl:  .  gabapentin (NEURONTIN) 300 MG capsule, Take 1 capsule (300 mg total) by mouth 4 (four) times daily., Disp: 120 capsule, Rfl: 1 .  ipratropium (ATROVENT) 0.06 % nasal spray, Place into the nose., Disp: , Rfl:  .  KLOR-CON 10 10 MEQ tablet, Take 10 mEq by mouth as needed. , Disp: , Rfl: 5 .  montelukast (SINGULAIR) 10 MG tablet, Take 10 mg by mouth at bedtime., Disp: , Rfl:  .  oxyCODONE-acetaminophen (PERCOCET/ROXICET) 5-325 MG tablet, Take 1 tablet by mouth 3 (three) times daily as needed for up to 30 days for severe pain., Disp: 75 tablet, Rfl: 0 .  PROAIR HFA 108 (90 BASE) MCG/ACT inhaler, Inhale 2 puffs into the lungs every 4 (four) hours as needed., Disp: , Rfl:  .  promethazine (PHENERGAN) 25 MG tablet, Take 50 mg by mouth as needed. , Disp: , Rfl:  .  risperiDONE (RISPERDAL) 1 MG tablet, Take 1 tablet (1 mg total) by mouth at bedtime., Disp: 30 tablet, Rfl: 2 .  zolpidem (AMBIEN) 10 MG tablet, Take 10 mg by mouth at bedtime as needed for sleep., Disp: , Rfl:    Assessment and Plan:  1. Chronic hip pain, right   2. Chronic right-sided low back pain with right-sided sciatica   3. Congenital scoliosis   4. Cervicalgia   5. Chronic pain syndrome   6. Chronic, continuous use of opioids   7. Bilateral sciatica   8. Scoliosis (and kyphoscoliosis), idiopathic   Based on our discussion today and upon review of the [facility] practitioner database information I am going to refill her medications for the Neurontin and baclofen and opioids.  The opioids will be dated for June 9 and July 9 and I am requesting her for a return to clinic in 2 months.  She is to continue follow-up with her primary  care physicians for her baseline medical care.  Follow Up Instructions:    I discussed the assessment and treatment plan with the patient. The patient was provided an opportunity to ask questions and all were answered. The patient agreed with the plan and demonstrated an understanding of the instructions.   The patient was advised to call back or seek an in-person evaluation if the symptoms worsen or if the condition fails to improve as anticipated.  I provided 30 minutes of non-face-to-face time during this encounter.   Molli Barrows, MD

## 2019-04-02 ENCOUNTER — Other Ambulatory Visit: Payer: Self-pay | Admitting: Medical Oncology

## 2019-04-02 DIAGNOSIS — Z1231 Encounter for screening mammogram for malignant neoplasm of breast: Secondary | ICD-10-CM

## 2019-04-20 ENCOUNTER — Ambulatory Visit: Payer: Medicare HMO | Attending: Anesthesiology | Admitting: Anesthesiology

## 2019-04-20 ENCOUNTER — Encounter: Payer: Self-pay | Admitting: Anesthesiology

## 2019-04-20 ENCOUNTER — Other Ambulatory Visit: Payer: Self-pay

## 2019-04-20 DIAGNOSIS — M4697 Unspecified inflammatory spondylopathy, lumbosacral region: Secondary | ICD-10-CM

## 2019-04-20 DIAGNOSIS — G894 Chronic pain syndrome: Secondary | ICD-10-CM | POA: Diagnosis not present

## 2019-04-20 DIAGNOSIS — G8929 Other chronic pain: Secondary | ICD-10-CM

## 2019-04-20 DIAGNOSIS — M5136 Other intervertebral disc degeneration, lumbar region: Secondary | ICD-10-CM | POA: Diagnosis not present

## 2019-04-20 DIAGNOSIS — M5441 Lumbago with sciatica, right side: Secondary | ICD-10-CM

## 2019-04-20 DIAGNOSIS — M25551 Pain in right hip: Secondary | ICD-10-CM | POA: Diagnosis not present

## 2019-04-20 MED ORDER — OXYCODONE-ACETAMINOPHEN 5-325 MG PO TABS: 1.0000 | ORAL_TABLET | Freq: Three times a day (TID) | ORAL | 0 refills | Status: DC | PRN

## 2019-04-20 MED ORDER — PREGABALIN 75 MG PO CAPS: 75.0000 mg | ORAL_CAPSULE | Freq: Two times a day (BID) | ORAL | 3 refills | Status: DC

## 2019-04-20 NOTE — Progress Notes (Signed)
Virtual Visit via Video Note  I connected with [name] on 04/20/19 at  3:15 PM EDT by a video enabled telemedicine application and verified that I am speaking with the correct person using two identifiers.  Location: Patient: Home Provider: Pain control center   I discussed the limitations of evaluation and management by telemedicine and the availability of in person appointments. The patient expressed understanding and agreed to proceed.  History of Present Illness: I spoke today with Ms. Enneking regarding her chronic low back pain and body pain.  This was with video virtual voice conversation and she appears to be doing well.  She has been taking her medications as prescribed and the opioid medications are working well for her.  She is having no side effects and continues to derive good functional lifestyle improvement with them.  This is been stable.  She also has been on gabapentin in the past however it did cause some swelling symptoms for her.  She has tried Lyrica and this seems to work better for her giving her better relief and no swelling symptoms.  Otherwise she is in her usual state of health today and reports doing well with her medication management for her chronic pain.    Observations/Objective: Current Outpatient Medications:  .  baclofen (LIORESAL) 10 MG tablet, Take 1 tablet (10 mg total) by mouth 3 (three) times daily for 30 days., Disp: 90 tablet, Rfl: 3 .  clonazePAM (KLONOPIN) 0.5 MG tablet, Take 0.25 mg by mouth daily. , Disp: , Rfl:  .  diphenoxylate-atropine (LOMOTIL) 2.5-0.025 MG per tablet, Take 1 tablet by mouth 4 (four) times daily as needed for diarrhea or loose stools., Disp: , Rfl:  .  FLOVENT HFA 110 MCG/ACT inhaler, Inhale 2 puffs into the lungs at bedtime. , Disp: , Rfl:  .  fluticasone (FLONASE) 50 MCG/ACT nasal spray, Place 1 spray into both nostrils 2 (two) times daily., Disp: , Rfl:  .  furosemide (LASIX) 40 MG tablet, Take 1 tablet by mouth as  needed. , Disp: , Rfl:  .  gabapentin (NEURONTIN) 300 MG capsule, Take 1 capsule (300 mg total) by mouth 4 (four) times daily for 30 days., Disp: 120 capsule, Rfl: 3 .  ipratropium (ATROVENT) 0.06 % nasal spray, Place into the nose., Disp: , Rfl:  .  KLOR-CON 10 10 MEQ tablet, Take 10 mEq by mouth as needed. , Disp: , Rfl: 5 .  montelukast (SINGULAIR) 10 MG tablet, Take 10 mg by mouth at bedtime., Disp: , Rfl:  .  [START ON 05/30/2019] oxyCODONE-acetaminophen (PERCOCET) 5-325 MG tablet, Take 1 tablet by mouth every 8 (eight) hours as needed for up to 30 days for severe pain., Disp: 75 tablet, Rfl: 0 .  oxyCODONE-acetaminophen (PERCOCET/ROXICET) 5-325 MG tablet, Take 1 tablet by mouth 3 (three) times daily as needed for up to 30 days for severe pain., Disp: 75 tablet, Rfl: 0 .  pregabalin (LYRICA) 75 MG capsule, Take 1 capsule (75 mg total) by mouth 2 (two) times daily for 30 days., Disp: 60 capsule, Rfl: 3 .  PROAIR HFA 108 (90 BASE) MCG/ACT inhaler, Inhale 2 puffs into the lungs every 4 (four) hours as needed., Disp: , Rfl:  .  promethazine (PHENERGAN) 25 MG tablet, Take 50 mg by mouth as needed. , Disp: , Rfl:  .  risperiDONE (RISPERDAL) 1 MG tablet, Take 1 tablet (1 mg total) by mouth at bedtime., Disp: 30 tablet, Rfl: 2 .  zolpidem (AMBIEN) 10 MG tablet, Take 10  mg by mouth at bedtime as needed for sleep., Disp: , Rfl:   Assessment and Plan: 1. Chronic pain syndrome   2. Chronic hip pain, right   3. 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)   4. DDD (degenerative disc disease), lumbar   5. Chronic right-sided low back pain with right-sided sciatica   Based on our discussion today and upon review of the [facility] practitioner database information I am going to refill her medicines.  She currently has an outstanding July 9 prescription for her Percocet and I am going to refill the Percocet for August 8 with a 29-month return to clinic for evaluation.  In the meantime I  am going to have her discontinue her Neurontin and start Lyrica at 75 mg twice a day.  She is instructed to contact us should she have any problems with her pain management and continue follow-up with her primary care physician for her baseline medical care.  Follow Up Instructions:    I discussed the assessment and treatment plan with the patient. The patient was provided an opportunity to ask questions and all were answered. The patient agreed with the plan and demonstrated an understanding of the instructions.   The patient was advised to call back or seek an in-person evaluation if the symptoms worsen or if the condition fails to improve as anticipated.  I provided 30 minutes of non-face-to-face time during this encounter.   Molli Barrows, MD

## 2019-04-21 ENCOUNTER — Telehealth: Payer: Self-pay | Admitting: Anesthesiology

## 2019-04-21 NOTE — Telephone Encounter (Signed)
Patient states Pharmacy told her Lyrica needs PA

## 2019-05-27 IMAGING — CR DG SCOLIOSIS EVAL COMPLETE SPINE 2-3V
2 series · 8 of 8 positions shown · non-contrast
Comparison: None.

CLINICAL DATA: Chronic lower back pain.

EXAM:
DG SCOLIOSIS EVAL COMPLETE SPINE 2-3V

[Series 1: whole body ap · 0.14mm/px · 4 of 4 slices shown]
[im 1/4]
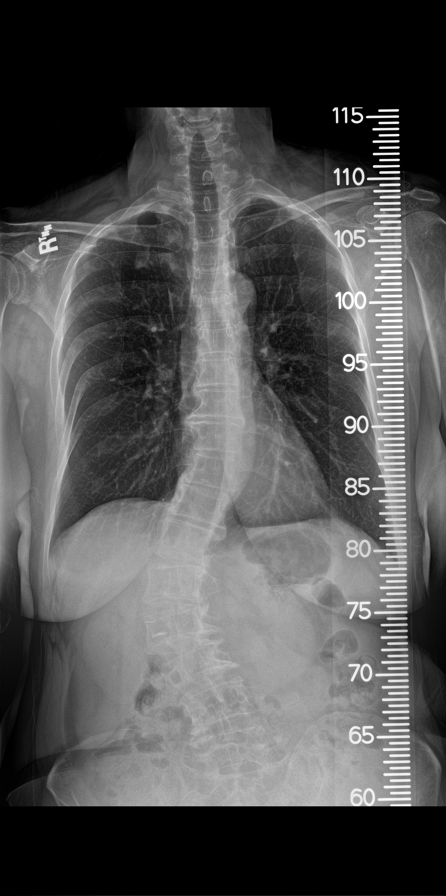
[im 2/4]
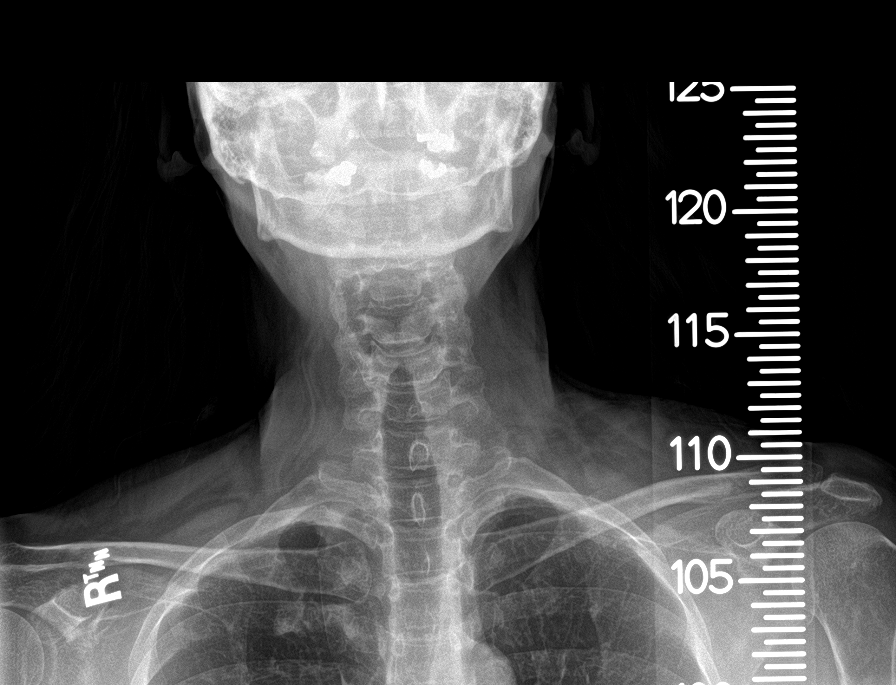
[im 3/4]
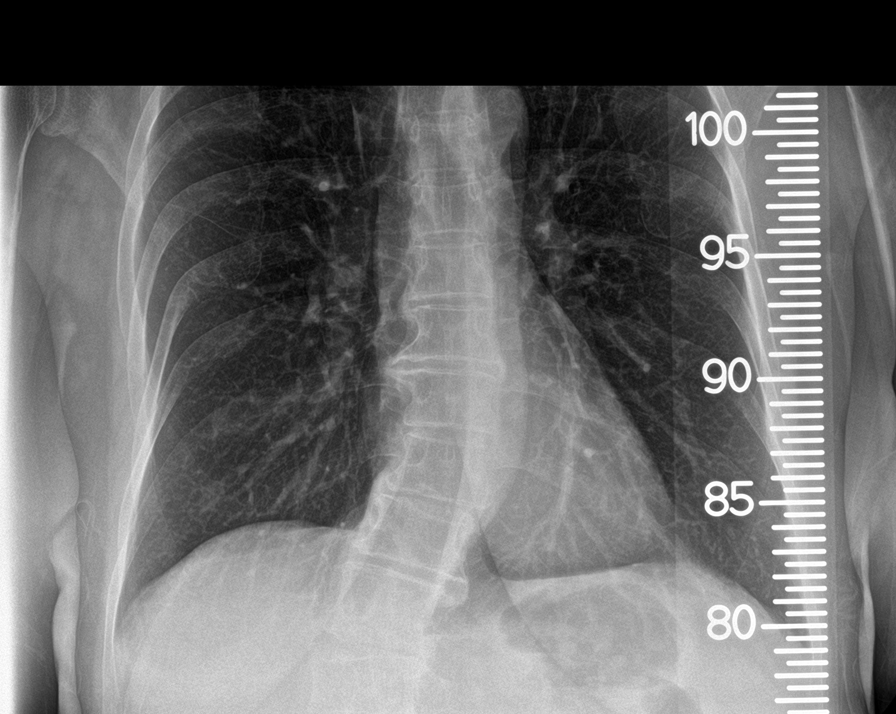
[im 4/4]
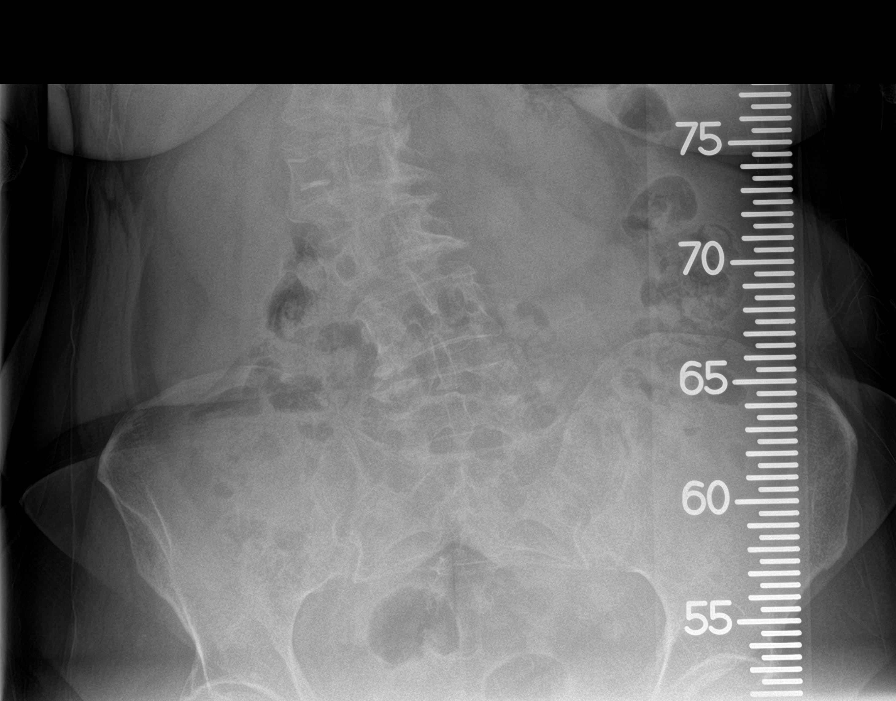

[Series 2: whole body lat · 0.14mm/px · 4 of 4 slices shown]
[im 1/4]
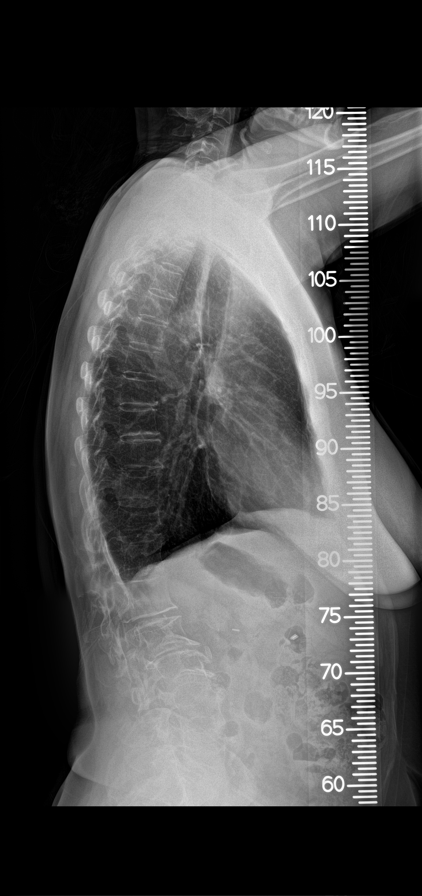
[im 2/4]
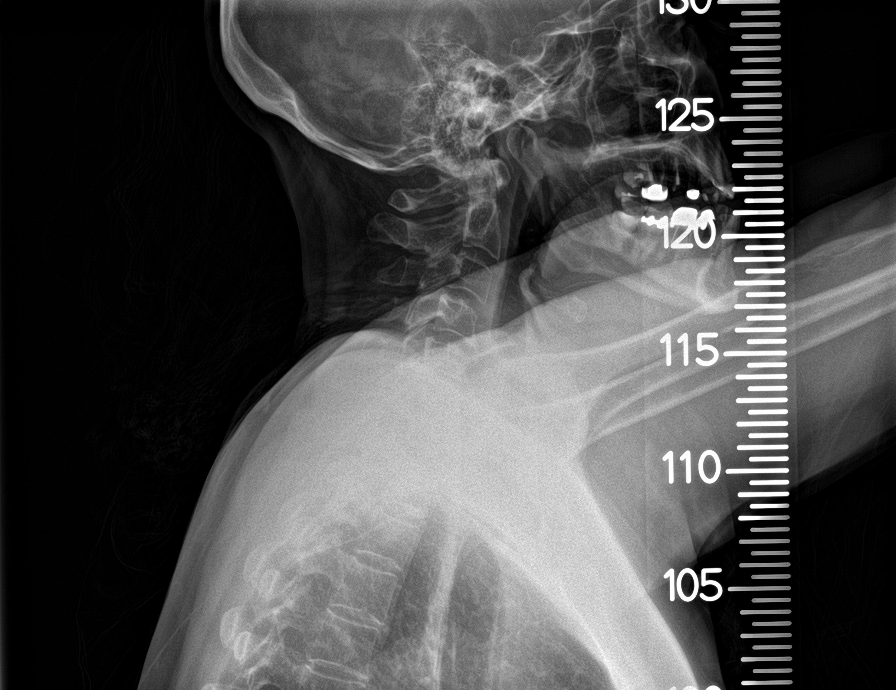
[im 3/4]
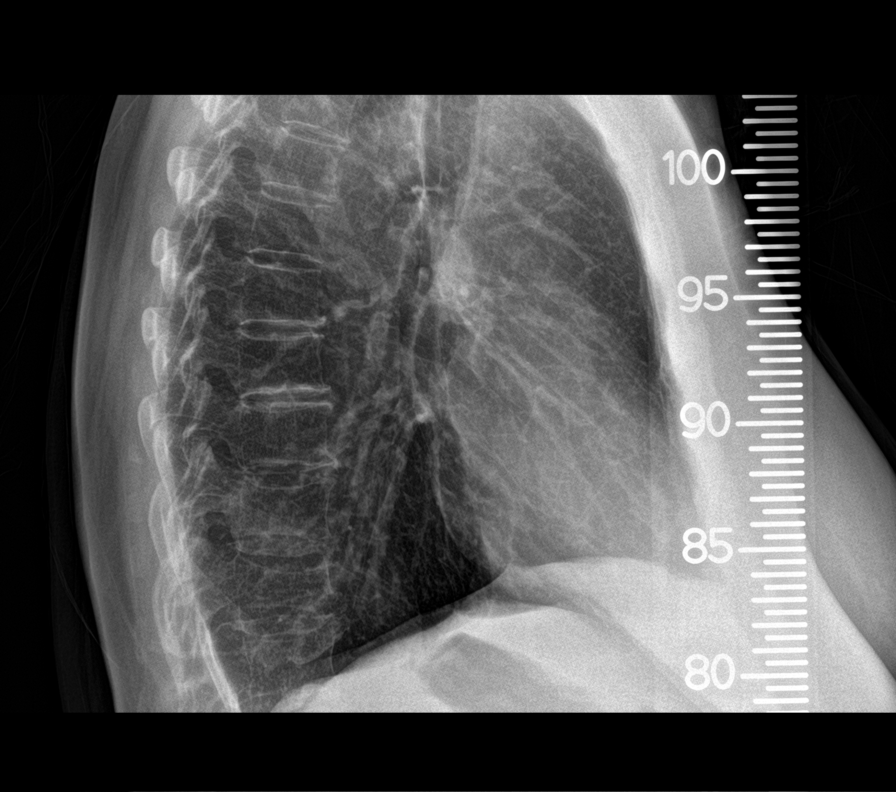
[im 4/4]
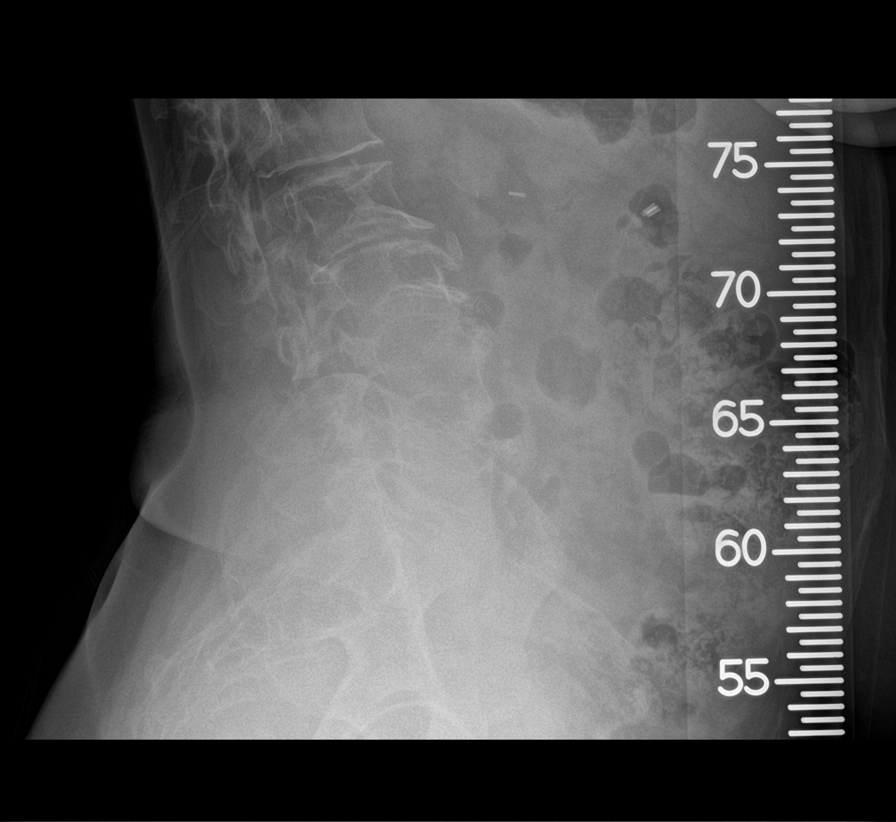

[8 of 8 positions shown; findings below may reference images not displayed]

FINDINGS: 40 degrees of dextroscoliosis is seen involving the lower thoracic
and lumbar spine, centered at the L1 level. No fracture or
spondylolisthesis is noted. Multilevel degenerative disc disease is
noted in the lumbar spine.
IMPRESSION: 40 degrees of dextroscoliosis is seen involving the lower thoracic
and lumbar spine.

## 2019-06-17 ENCOUNTER — Ambulatory Visit: Payer: Medicare HMO | Attending: Anesthesiology | Admitting: Anesthesiology

## 2019-06-17 ENCOUNTER — Encounter: Payer: Self-pay | Admitting: Anesthesiology

## 2019-06-17 ENCOUNTER — Other Ambulatory Visit: Payer: Self-pay

## 2019-06-17 DIAGNOSIS — M4697 Unspecified inflammatory spondylopathy, lumbosacral region: Secondary | ICD-10-CM | POA: Diagnosis not present

## 2019-06-17 DIAGNOSIS — G894 Chronic pain syndrome: Secondary | ICD-10-CM

## 2019-06-17 DIAGNOSIS — G8929 Other chronic pain: Secondary | ICD-10-CM

## 2019-06-17 DIAGNOSIS — M5136 Other intervertebral disc degeneration, lumbar region: Secondary | ICD-10-CM

## 2019-06-17 DIAGNOSIS — M5441 Lumbago with sciatica, right side: Secondary | ICD-10-CM

## 2019-06-17 DIAGNOSIS — M25551 Pain in right hip: Secondary | ICD-10-CM

## 2019-06-17 DIAGNOSIS — M542 Cervicalgia: Secondary | ICD-10-CM

## 2019-06-17 DIAGNOSIS — Q675 Congenital deformity of spine: Secondary | ICD-10-CM

## 2019-06-17 DIAGNOSIS — F1190 Opioid use, unspecified, uncomplicated: Secondary | ICD-10-CM

## 2019-06-17 MED ORDER — GABAPENTIN 300 MG PO CAPS: 300.0000 mg | ORAL_CAPSULE | Freq: Four times a day (QID) | ORAL | 3 refills | Status: DC

## 2019-06-17 MED ORDER — OXYCODONE-ACETAMINOPHEN 5-325 MG PO TABS: 1.0000 | ORAL_TABLET | Freq: Three times a day (TID) | ORAL | 0 refills | Status: AC | PRN

## 2019-06-17 MED ORDER — OXYCODONE-ACETAMINOPHEN 5-325 MG PO TABS: 1.0000 | ORAL_TABLET | Freq: Three times a day (TID) | ORAL | 0 refills | Status: DC | PRN

## 2019-06-17 NOTE — Progress Notes (Signed)
Virtual Visit via Video Note  I connected with Tami Hopkins on 06/17/19 at  1:15 PM EDT by a video enabled telemedicine application and verified that I am speaking with the correct person using two identifiers.  Location: Patient: Home Provider: Pain control center   I discussed the limitations of evaluation and management by telemedicine and the availability of in person appointments. The patient expressed understanding and agreed to proceed.  History of Present Illness: I spoke with Tami Hopkins today regarding her low back pain and hip pain.  The quality characteristic and distribution of been stable in nature.  She has been taking her opioid medications effectively and these continue to give her good relief.  She denies any problems with the medications nor side effects.  Based on our discussion she continues derive good functional lifestyle improvement with them.  The areas of pain are similar to what she is previously experienced.  She recently changed over to Lyrica but does not like this.  She would like to go back on the Neurontin.  In the past she has received epidural injections periodically for pain relief and is complaining of more right calf pain and radicular symptoms but is scared to come into the clinic secondary to the COVID crisis.    Observations/Objective: Current Outpatient Medications:  .  baclofen (LIORESAL) 10 MG tablet, Take 1 tablet (10 mg total) by mouth 3 (three) times daily for 30 days., Disp: 90 tablet, Rfl: 3 .  clonazePAM (KLONOPIN) 0.5 MG tablet, Take 0.25 mg by mouth daily. , Disp: , Rfl:  .  diphenoxylate-atropine (LOMOTIL) 2.5-0.025 MG per tablet, Take 1 tablet by mouth 4 (four) times daily as needed for diarrhea or loose stools., Disp: , Rfl:  .  FLOVENT HFA 110 MCG/ACT inhaler, Inhale 2 puffs into the lungs at bedtime. , Disp: , Rfl:  .  fluticasone (FLONASE) 50 MCG/ACT nasal spray, Place 1 spray into both nostrils 2 (two) times daily., Disp: , Rfl:  .   furosemide (LASIX) 40 MG tablet, Take 1 tablet by mouth as needed. , Disp: , Rfl:  .  gabapentin (NEURONTIN) 300 MG capsule, Take 1 capsule (300 mg total) by mouth 4 (four) times daily., Disp: 120 capsule, Rfl: 3 .  ipratropium (ATROVENT) 0.06 % nasal spray, Place into the nose., Disp: , Rfl:  .  KLOR-CON 10 10 MEQ tablet, Take 10 mEq by mouth as needed. , Disp: , Rfl: 5 .  montelukast (SINGULAIR) 10 MG tablet, Take 10 mg by mouth at bedtime., Disp: , Rfl:  .  [START ON 07/01/2019] oxyCODONE-acetaminophen (PERCOCET) 5-325 MG tablet, Take 1 tablet by mouth every 8 (eight) hours as needed for severe pain., Disp: 75 tablet, Rfl: 0 .  [START ON 07/31/2019] oxyCODONE-acetaminophen (PERCOCET) 5-325 MG tablet, Take 1 tablet by mouth every 8 (eight) hours as needed for severe pain., Disp: 75 tablet, Rfl: 0 .  pregabalin (LYRICA) 75 MG capsule, Take 1 capsule (75 mg total) by mouth 2 (two) times daily for 30 days., Disp: 60 capsule, Rfl: 3 .  PROAIR HFA 108 (90 BASE) MCG/ACT inhaler, Inhale 2 puffs into the lungs every 4 (four) hours as needed., Disp: , Rfl:  .  promethazine (PHENERGAN) 25 MG tablet, Take 50 mg by mouth as needed. , Disp: , Rfl:  .  risperiDONE (RISPERDAL) 1 MG tablet, Take 1 tablet (1 mg total) by mouth at bedtime., Disp: 30 tablet, Rfl: 2 .  zolpidem (AMBIEN) 10 MG tablet, Take 10 mg by mouth at  bedtime as needed for sleep., Disp: , Rfl:    Assessment and Plan: 1. Chronic pain syndrome   2. Chronic hip pain, right   3. 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 DDD (degenerative disc disease), lumbar   5. Chronic right-sided low back pain with right-sided sciatica   6. Congenital scoliosis   7. Cervicalgia   8. Chronic, continuous use of opioids   Based on our discussion today and upon review of the [place] practitioner database information I am going to refill her Percocet for September 9 and October 9.  We had a discussion about utilizing these and  potential interaction with Lyrica or Neurontin.  I have asked that she stop the Lyrica and I have refilled her Neurontin for 4 times daily dosing.  Should she have any problems with the medication she is been instructed to contact us at the pain control center.  Furthermore she is instructed to follow-up with her primary care physicians for baseline medical care.  We have talked about issues regarding stretching strengthening and physical therapy despite the COVID crisis and I am scheduling her for 78-month return to clinic.   Follow Up Instructions:    I discussed the assessment and treatment plan with the patient. The patient was provided an opportunity to ask questions and all were answered. The patient agreed with the plan and demonstrated an understanding of the instructions.   The patient was advised to call back or seek an in-person evaluation if the symptoms worsen or if the condition fails to improve as anticipated.  I provided 30 minutes of non-face-to-face time during this encounter.   Molli Barrows, MD

## 2019-07-24 ENCOUNTER — Ambulatory Visit
Admission: RE | Admit: 2019-07-24 | Discharge: 2019-07-24 | Disposition: A | Discharge status: Home / Self Care | Payer: Medicare HMO | Source: Ambulatory Visit | Attending: Medical Oncology | Admitting: Medical Oncology

## 2019-07-24 DIAGNOSIS — Z1231 Encounter for screening mammogram for malignant neoplasm of breast: Secondary | ICD-10-CM | POA: Diagnosis not present

## 2019-08-11 ENCOUNTER — Ambulatory Visit: Payer: Medicare HMO | Attending: Anesthesiology | Admitting: Anesthesiology

## 2019-08-11 ENCOUNTER — Other Ambulatory Visit: Payer: Self-pay

## 2019-08-11 DIAGNOSIS — Q675 Congenital deformity of spine: Secondary | ICD-10-CM

## 2019-08-11 DIAGNOSIS — G8929 Other chronic pain: Secondary | ICD-10-CM

## 2019-08-11 DIAGNOSIS — M25551 Pain in right hip: Secondary | ICD-10-CM | POA: Diagnosis not present

## 2019-08-11 DIAGNOSIS — M5432 Sciatica, left side: Secondary | ICD-10-CM

## 2019-08-11 DIAGNOSIS — M542 Cervicalgia: Secondary | ICD-10-CM

## 2019-08-11 DIAGNOSIS — G894 Chronic pain syndrome: Secondary | ICD-10-CM | POA: Diagnosis not present

## 2019-08-11 DIAGNOSIS — M5431 Sciatica, right side: Secondary | ICD-10-CM

## 2019-08-11 DIAGNOSIS — M5441 Lumbago with sciatica, right side: Secondary | ICD-10-CM

## 2019-08-11 DIAGNOSIS — F1190 Opioid use, unspecified, uncomplicated: Secondary | ICD-10-CM

## 2019-08-11 DIAGNOSIS — M51369 Other intervertebral disc degeneration, lumbar region without mention of lumbar back pain or lower extremity pain: Secondary | ICD-10-CM

## 2019-08-11 DIAGNOSIS — M5136 Other intervertebral disc degeneration, lumbar region: Secondary | ICD-10-CM

## 2019-08-11 MED ORDER — OXYCODONE-ACETAMINOPHEN 5-325 MG PO TABS: 1.0000 | ORAL_TABLET | Freq: Three times a day (TID) | ORAL | 0 refills | Status: DC | PRN

## 2019-08-11 NOTE — Progress Notes (Signed)
Virtual Visit via Video Note  I connected with Tami Hopkins on 08/11/19 at  1:45 PM EDT by a video enabled telemedicine application and verified that I am speaking with the correct person using two identifiers.  Location: Patient: Home Provider: Pain control center   I discussed the limitations of evaluation and management by telemedicine and the availability of in person appointments. The patient expressed understanding and agreed to proceed.  History of Present Illness: I spoke with Tami Hopkins today regarding her recurrent low back pain and leg pain.  She has been having some social issues with her housing and unfortunately has been unable to sleep in her normal space and has been sleeping on a couch which has caused some worsening of her low back pain.  This runs from the low back with a cramping gnawing nature down into the legs.  No change in lower extremity strength or function is noted.  Her bowel bladder function has been stable she has been ambulatory and trying to exercise but this has been limited because of the Covid crisis.  She has been taking her medications as prescribed and these continue to work well for her.  She is taking her baclofen and Neurontin as scheduled as well.  She states that in the past she has had epidurals but these worsened her back pain.  She feels that when she gets back to her usual home circumstances and can sleep in her bed, things will be better.  Otherwise she is in her usual state of health.   Observations/Objective:  Current Outpatient Medications:  .  baclofen (LIORESAL) 10 MG tablet, Take 1 tablet (10 mg total) by mouth 3 (three) times daily for 30 days., Disp: 90 tablet, Rfl: 3 .  clonazePAM (KLONOPIN) 0.5 MG tablet, Take 0.25 mg by mouth daily. , Disp: , Rfl:  .  diphenoxylate-atropine (LOMOTIL) 2.5-0.025 MG per tablet, Take 1 tablet by mouth 4 (four) times daily as needed for diarrhea or loose stools., Disp: , Rfl:  .  FLOVENT HFA 110 MCG/ACT  inhaler, Inhale 2 puffs into the lungs at bedtime. , Disp: , Rfl:  .  fluticasone (FLONASE) 50 MCG/ACT nasal spray, Place 1 spray into both nostrils 2 (two) times daily., Disp: , Rfl:  .  furosemide (LASIX) 40 MG tablet, Take 1 tablet by mouth as needed. , Disp: , Rfl:  .  gabapentin (NEURONTIN) 300 MG capsule, Take 1 capsule (300 mg total) by mouth 4 (four) times daily., Disp: 120 capsule, Rfl: 3 .  ipratropium (ATROVENT) 0.06 % nasal spray, Place into the nose., Disp: , Rfl:  .  KLOR-CON 10 10 MEQ tablet, Take 10 mEq by mouth as needed. , Disp: , Rfl: 5 .  montelukast (SINGULAIR) 10 MG tablet, Take 10 mg by mouth at bedtime., Disp: , Rfl:  .  [START ON 08/30/2019] oxyCODONE-acetaminophen (PERCOCET) 5-325 MG tablet, Take 1 tablet by mouth every 8 (eight) hours as needed for severe pain., Disp: 75 tablet, Rfl: 0 .  [START ON 09/29/2019] oxyCODONE-acetaminophen (PERCOCET) 5-325 MG tablet, Take 1 tablet by mouth every 8 (eight) hours as needed for moderate pain or severe pain., Disp: 75 tablet, Rfl: 0 .  PROAIR HFA 108 (90 BASE) MCG/ACT inhaler, Inhale 2 puffs into the lungs every 4 (four) hours as needed., Disp: , Rfl:  .  promethazine (PHENERGAN) 25 MG tablet, Take 50 mg by mouth as needed. , Disp: , Rfl:  .  risperiDONE (RISPERDAL) 1 MG tablet, Take 1 tablet (1 mg  total) by mouth at bedtime., Disp: 30 tablet, Rfl: 2 .  zolpidem (AMBIEN) 10 MG tablet, Take 10 mg by mouth at bedtime as needed for sleep., Disp: , Rfl:   Assessment and Plan: 1. Chronic pain syndrome   2. Chronic hip pain, right   3. Congenital scoliosis   4. Chronic right-sided low back pain with right-sided sciatica   5. DDD (degenerative disc disease), lumbar   6. Cervicalgia   7. Chronic, continuous use of opioids   8. Bilateral sciatica    Based on our discussion today and upon review of the [place] practitioner database information going to refill her medications for the next 2 months dated for November 8 and December  8.  I want her to continue with her muscle relaxants and Neurontin and I am scheduling her for return to clinic in 2 months.  I have given her the option to report to the clinic in 1 month for trigger point injections and further evaluation if she is not feeling better in the next month.  Should she have any changes or problems with her medication she is instructed to contact the pain control center.  I also want her to follow-up with her primary care physicians for her baseline medical care.  Follow Up Instructions:    I discussed the assessment and treatment plan with the patient. The patient was provided an opportunity to ask questions and all were answered. The patient agreed with the plan and demonstrated an understanding of the instructions.   The patient was advised to call back or seek an in-person evaluation if the symptoms worsen or if the condition fails to improve as anticipated.  I provided 30 minutes of non-face-to-face time during this encounter.   Molli Barrows, MD

## 2019-08-14 ENCOUNTER — Telehealth: Payer: Self-pay | Admitting: *Deleted

## 2019-08-14 NOTE — Telephone Encounter (Signed)
She needs to make an appointment and discuss all of these medication changes with Dr [name].  Please schedule her an appointment.

## 2019-08-20 ENCOUNTER — Other Ambulatory Visit: Payer: Self-pay

## 2019-08-20 ENCOUNTER — Encounter: Payer: Self-pay | Admitting: Anesthesiology

## 2019-08-20 ENCOUNTER — Ambulatory Visit: Payer: Medicare HMO | Attending: Anesthesiology | Admitting: Anesthesiology

## 2019-08-20 DIAGNOSIS — M542 Cervicalgia: Secondary | ICD-10-CM

## 2019-08-20 DIAGNOSIS — G8929 Other chronic pain: Secondary | ICD-10-CM

## 2019-08-20 DIAGNOSIS — M25551 Pain in right hip: Secondary | ICD-10-CM

## 2019-08-20 DIAGNOSIS — M4120 Other idiopathic scoliosis, site unspecified: Secondary | ICD-10-CM

## 2019-08-20 DIAGNOSIS — M549 Dorsalgia, unspecified: Secondary | ICD-10-CM

## 2019-08-20 DIAGNOSIS — M5432 Sciatica, left side: Secondary | ICD-10-CM

## 2019-08-20 DIAGNOSIS — M5431 Sciatica, right side: Secondary | ICD-10-CM

## 2019-08-20 DIAGNOSIS — Q675 Congenital deformity of spine: Secondary | ICD-10-CM

## 2019-08-20 DIAGNOSIS — M5136 Other intervertebral disc degeneration, lumbar region: Secondary | ICD-10-CM

## 2019-08-20 DIAGNOSIS — G894 Chronic pain syndrome: Secondary | ICD-10-CM

## 2019-08-20 DIAGNOSIS — F1190 Opioid use, unspecified, uncomplicated: Secondary | ICD-10-CM

## 2019-08-20 DIAGNOSIS — M5441 Lumbago with sciatica, right side: Secondary | ICD-10-CM

## 2019-08-20 MED ORDER — TOPIRAMATE 25 MG PO TABS: 25.0000 mg | ORAL_TABLET | Freq: Two times a day (BID) | ORAL | 0 refills | Status: DC

## 2019-09-12 ENCOUNTER — Other Ambulatory Visit: Payer: Self-pay | Admitting: Anesthesiology

## 2019-09-22 ENCOUNTER — Other Ambulatory Visit: Payer: Self-pay | Admitting: Anesthesiology

## 2019-09-23 ENCOUNTER — Other Ambulatory Visit: Payer: Self-pay | Admitting: Anesthesiology

## 2019-09-23 NOTE — Telephone Encounter (Signed)
Blanch Media, Did you personally speak with the patient? A fax or a e-scribe request is not acceptable. We must have contact with the patient. Please advise.   Anderson Malta

## 2019-09-23 NOTE — Telephone Encounter (Signed)
Pt called requesting an update on her baclofen. Please return her call.                                                  Thanks

## 2019-09-23 NOTE — Telephone Encounter (Signed)
Dr. [name] didn't call in her baclofen last time. The pharmacy has faxed a request over. She needs it filled.

## 2019-09-25 ENCOUNTER — Telehealth: Payer: Self-pay | Admitting: Anesthesiology

## 2019-09-25 NOTE — Telephone Encounter (Signed)
Will resend message to Dr [name]. Not sure when this will be done.

## 2019-09-25 NOTE — Telephone Encounter (Signed)
Patient lvmail check to see when Dr. [name] was going to send her baclofen script in, it was not ordered on her last appt.

## 2019-09-28 ENCOUNTER — Encounter: Payer: Self-pay | Admitting: Anesthesiology

## 2019-09-28 ENCOUNTER — Ambulatory Visit: Payer: Medicaid Other | Attending: Anesthesiology | Admitting: Anesthesiology

## 2019-09-28 ENCOUNTER — Other Ambulatory Visit: Payer: Self-pay

## 2019-09-28 DIAGNOSIS — M542 Cervicalgia: Secondary | ICD-10-CM

## 2019-09-28 DIAGNOSIS — G894 Chronic pain syndrome: Secondary | ICD-10-CM | POA: Diagnosis not present

## 2019-09-28 DIAGNOSIS — M4120 Other idiopathic scoliosis, site unspecified: Secondary | ICD-10-CM

## 2019-09-28 DIAGNOSIS — M5432 Sciatica, left side: Secondary | ICD-10-CM

## 2019-09-28 DIAGNOSIS — Z79891 Long term (current) use of opiate analgesic: Secondary | ICD-10-CM | POA: Diagnosis not present

## 2019-09-28 DIAGNOSIS — M5431 Sciatica, right side: Secondary | ICD-10-CM

## 2019-09-28 DIAGNOSIS — F1190 Opioid use, unspecified, uncomplicated: Secondary | ICD-10-CM

## 2019-09-28 DIAGNOSIS — Q675 Congenital deformity of spine: Secondary | ICD-10-CM

## 2019-09-28 MED ORDER — BACLOFEN 10 MG PO TABS: 10.0000 mg | ORAL_TABLET | Freq: Two times a day (BID) | ORAL | 3 refills | Status: DC

## 2019-09-28 MED ORDER — OXYCODONE-ACETAMINOPHEN 5-325 MG PO TABS: 1.0000 | ORAL_TABLET | Freq: Three times a day (TID) | ORAL | 0 refills | Status: AC | PRN

## 2019-09-28 NOTE — Progress Notes (Signed)
Virtual Visit via Video Note  I connected with Tami Hopkins on 09/28/19 at  2:00 PM EST by a video enabled telemedicine application and verified that I am speaking with the correct person using two identifiers.  Location: Patient: Home Provider: Pain control center   I discussed the limitations of evaluation and management by telemedicine and the availability of in person appointments. The patient expressed understanding and agreed to proceed.  History of Present Illness: I was able to reach Tami Hopkins for her virtual conference today via Administrator.  She reports that she has done well with her opioid therapy.  This continues to give her good relief of her low back pain and neck pain.  No significant change in the quality characteristic or distribution of this are noted.  She reports that her upper and lower extremity strength are stable.  She still having the same back pain that she is experienced for a long period of time and is taking her medications as prescribed.  She just had her medicines refilled recently and is due for a January refill.  She also did better with muscle spasm when she was taking her baclofen as scheduled.  No other changes are reported and she continues to derive good functional lifestyle provement with her medications.    Observations/Objective: Current Outpatient Medications:  .  baclofen (LIORESAL) 10 MG tablet, Take 1 tablet (10 mg total) by mouth 2 (two) times daily., Disp: 60 tablet, Rfl: 3 .  clonazePAM (KLONOPIN) 0.5 MG tablet, Take 0.25 mg by mouth daily. , Disp: , Rfl:  .  diphenoxylate-atropine (LOMOTIL) 2.5-0.025 MG per tablet, Take 1 tablet by mouth 4 (four) times daily as needed for diarrhea or loose stools., Disp: , Rfl:  .  FLOVENT HFA 110 MCG/ACT inhaler, Inhale 2 puffs into the lungs at bedtime. , Disp: , Rfl:  .  fluticasone (FLONASE) 50 MCG/ACT nasal spray, Place 1 spray into both nostrils 2 (two) times daily., Disp: , Rfl:  .   furosemide (LASIX) 40 MG tablet, Take 1 tablet by mouth as needed. , Disp: , Rfl:  .  ipratropium (ATROVENT) 0.06 % nasal spray, Place into the nose., Disp: , Rfl:  .  KLOR-CON 10 10 MEQ tablet, Take 10 mEq by mouth as needed. , Disp: , Rfl: 5 .  montelukast (SINGULAIR) 10 MG tablet, Take 10 mg by mouth at bedtime., Disp: , Rfl:  .  oxyCODONE-acetaminophen (PERCOCET) 5-325 MG tablet, Take 1 tablet by mouth every 8 (eight) hours as needed for moderate pain or severe pain., Disp: 75 tablet, Rfl: 0 .  [START ON 10/29/2019] oxyCODONE-acetaminophen (PERCOCET) 5-325 MG tablet, Take 1 tablet by mouth every 8 (eight) hours as needed for severe pain., Disp: 75 tablet, Rfl: 0 .  PROAIR HFA 108 (90 BASE) MCG/ACT inhaler, Inhale 2 puffs into the lungs every 4 (four) hours as needed., Disp: , Rfl:  .  promethazine (PHENERGAN) 25 MG tablet, Take 50 mg by mouth as needed. , Disp: , Rfl:  .  risperiDONE (RISPERDAL) 1 MG tablet, Take 1 tablet (1 mg total) by mouth at bedtime., Disp: 30 tablet, Rfl: 2 .  topiramate (TOPAMAX) 25 MG tablet, Take 1 tablet (25 mg total) by mouth 2 (two) times daily., Disp: 60 tablet, Rfl: 0 .  zolpidem (AMBIEN) 10 MG tablet, Take 10 mg by mouth at bedtime as needed for sleep., Disp: , Rfl:    Assessment and Plan:  1. Chronic pain syndrome   2. Chronic, continuous use of  opioids   3. Scoliosis (and kyphoscoliosis), idiopathic   4. Cervicalgia   5. Congenital scoliosis   6. Bilateral sciatica   Based on our discussion and review today I am going to refill her medications upon review of the [facility] practitioner database information.  This will be for the January 7 refill date.  I will schedule her for return to clinic in approximately 2 months.  I want her to continue with her back stretching strengthening exercises as reviewed again today.  I will refill her baclofen as this was working well for her and giving her good relief of both her cervical neck spasm which is occasional and  her low back muscle spasm.  This medication has worked well for her in the past and this combination seems to be effective.  She desires to avoid any type of interventional therapy which has been offered to her. Follow Up Instructions:    I discussed the assessment and treatment plan with the patient. The patient was provided an opportunity to ask questions and all were answered. The patient agreed with the plan and demonstrated an understanding of the instructions.   The patient was advised to call back or seek an in-person evaluation if the symptoms worsen or if the condition fails to improve as anticipated.  I provided 30 minutes of non-face-to-face time during this encounter.   Molli Barrows, MD

## 2019-09-28 NOTE — Telephone Encounter (Signed)
Will discuss today.ja

## 2019-09-29 ENCOUNTER — Telehealth: Payer: Self-pay | Admitting: Anesthesiology

## 2019-09-29 NOTE — Telephone Encounter (Signed)
I called [facility], pt has not yet filled the script dated today, 09-29-19. The last one she filled was 08-31-19, for 75 pills.  The script she can fill today is for 75 pills, not 90 as stated in the message.

## 2019-09-29 NOTE — Telephone Encounter (Signed)
Patients daughter called stating her mom picked up script and only received 60 pills instead of 90. Please check and let patient know what she needs to do

## 2019-09-30 ENCOUNTER — Telehealth: Payer: Self-pay | Admitting: Anesthesiology

## 2019-09-30 NOTE — Telephone Encounter (Signed)
Needs to be tid for quantity of 90. That is what was written in the past. Will confirm with Dr. [name] tomorrow and fix script if ordered. Patient to call and check back with [place] after 2 tomorrow 10/01/19.

## 2019-09-30 NOTE — Telephone Encounter (Signed)
Patient states her medications are wrong and needs to talk with someone asap

## 2019-10-01 ENCOUNTER — Telehealth: Payer: Self-pay | Admitting: Anesthesiology

## 2019-10-01 NOTE — Telephone Encounter (Signed)
Per Dr. [name] , will keep Baclofen to BID dosing. Patient called and informed.

## 2019-10-01 NOTE — Telephone Encounter (Signed)
Patient Tami Hopkins 10-01-19 stating she was returning Jennifer's call. 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 She has been ill and having a hard time keeping up with things. Would like to speak with Anderson Malta again if possible.

## 2019-10-26 ENCOUNTER — Telehealth: Payer: Self-pay | Admitting: Anesthesiology

## 2019-10-26 NOTE — Telephone Encounter (Signed)
[facility] [place] Please change in chart. She has upcoming appt on the 7th.

## 2019-10-26 NOTE — Telephone Encounter (Signed)
[facility]

## 2019-10-29 ENCOUNTER — Ambulatory Visit: Payer: Medicare HMO | Attending: Anesthesiology | Admitting: Anesthesiology

## 2019-10-29 ENCOUNTER — Other Ambulatory Visit: Payer: Self-pay

## 2019-10-29 DIAGNOSIS — M4120 Other idiopathic scoliosis, site unspecified: Secondary | ICD-10-CM | POA: Diagnosis not present

## 2019-10-29 DIAGNOSIS — G894 Chronic pain syndrome: Secondary | ICD-10-CM | POA: Diagnosis not present

## 2019-10-29 DIAGNOSIS — F1190 Opioid use, unspecified, uncomplicated: Secondary | ICD-10-CM | POA: Diagnosis not present

## 2019-10-29 DIAGNOSIS — M5432 Sciatica, left side: Secondary | ICD-10-CM

## 2019-10-29 DIAGNOSIS — M5136 Other intervertebral disc degeneration, lumbar region: Secondary | ICD-10-CM

## 2019-10-29 DIAGNOSIS — M5431 Sciatica, right side: Secondary | ICD-10-CM

## 2019-10-29 DIAGNOSIS — M542 Cervicalgia: Secondary | ICD-10-CM

## 2019-10-29 DIAGNOSIS — Q675 Congenital deformity of spine: Secondary | ICD-10-CM

## 2019-10-29 DIAGNOSIS — M549 Dorsalgia, unspecified: Secondary | ICD-10-CM

## 2019-10-29 DIAGNOSIS — M51369 Other intervertebral disc degeneration, lumbar region without mention of lumbar back pain or lower extremity pain: Secondary | ICD-10-CM

## 2019-10-29 MED ORDER — OXYCODONE-ACETAMINOPHEN 5-325 MG PO TABS: 1.0000 | ORAL_TABLET | Freq: Three times a day (TID) | ORAL | 0 refills | Status: DC | PRN

## 2019-10-29 NOTE — Progress Notes (Signed)
Virtual Visit via Video Note  I connected with Tami Hopkins on 10/29/19 at 11:45 AM EST by a video enabled telemedicine application and verified that I am speaking with the correct person using two identifiers.  Location: Patient: Home Provider: Pain control center   I discussed the limitations of evaluation and management by telemedicine and the availability of in person appointments. The patient expressed understanding and agreed to proceed.  History of Present Illness: I spoke with Tami Hopkins today via video for her [employer].  She reports that her back pain has been reasonably stable compared to baseline.  She still generally has pain throughout the day but it is well controlled with her current regimen of opioid.  She takes these 2-3 times a day.  She has noticed that the Neurontin she takes tends to cause her to have some water retention to the lower extremities.  She is currently touching base with her primary care physicians regarding initiating a diuretic to help with this fluid retention.  She denies any problems with shortness of breath or fatigue.  Otherwise she is in her usual state of health and the quality characteristic and distribution of the low back pain have been stable in nature.  She is trying to be active but is restricted because of the Covid crisis and she is also had some problems with her living arrangements.  She has been borderline homeless for a few months.  This seems to be stabilizing she reports.    Observations/Objective:  Current Outpatient Medications:  .  baclofen (LIORESAL) 10 MG tablet, Take 1 tablet (10 mg total) by mouth 2 (two) times daily., Disp: 60 tablet, Rfl: 3 .  clonazePAM (KLONOPIN) 0.5 MG tablet, Take 0.25 mg by mouth daily. , Disp: , Rfl:  .  diphenoxylate-atropine (LOMOTIL) 2.5-0.025 MG per tablet, Take 1 tablet by mouth 4 (four) times daily as needed for diarrhea or loose stools., Disp: , Rfl:  .  FLOVENT HFA 110 MCG/ACT inhaler,  Inhale 2 puffs into the lungs at bedtime. , Disp: , Rfl:  .  fluticasone (FLONASE) 50 MCG/ACT nasal spray, Place 1 spray into both nostrils 2 (two) times daily., Disp: , Rfl:  .  furosemide (LASIX) 40 MG tablet, Take 1 tablet by mouth as needed. , Disp: , Rfl:  .  ipratropium (ATROVENT) 0.06 % nasal spray, Place into the nose., Disp: , Rfl:  .  KLOR-CON 10 10 MEQ tablet, Take 10 mEq by mouth as needed. , Disp: , Rfl: 5 .  montelukast (SINGULAIR) 10 MG tablet, Take 10 mg by mouth at bedtime., Disp: , Rfl:  .  oxyCODONE-acetaminophen (PERCOCET) 5-325 MG tablet, Take 1 tablet by mouth every 8 (eight) hours as needed for severe pain., Disp: 75 tablet, Rfl: 0 .  [START ON 11/28/2019] oxyCODONE-acetaminophen (PERCOCET) 5-325 MG tablet, Take 1 tablet by mouth every 8 (eight) hours as needed for moderate pain or severe pain., Disp: 75 tablet, Rfl: 0 .  PROAIR HFA 108 (90 BASE) MCG/ACT inhaler, Inhale 2 puffs into the lungs every 4 (four) hours as needed., Disp: , Rfl:  .  promethazine (PHENERGAN) 25 MG tablet, Take 50 mg by mouth as needed. , Disp: , Rfl:  .  risperiDONE (RISPERDAL) 1 MG tablet, Take 1 tablet (1 mg total) by mouth at bedtime., Disp: 30 tablet, Rfl: 2 .  topiramate (TOPAMAX) 25 MG tablet, Take 1 tablet (25 mg total) by mouth 2 (two) times daily., Disp: 60 tablet, Rfl: 0 .  zolpidem (AMBIEN)  10 MG tablet, Take 10 mg by mouth at bedtime as needed for sleep., Disp: , Rfl:   Assessment and Plan: 1. Chronic pain syndrome   2. Chronic, continuous use of opioids   3. Scoliosis (and kyphoscoliosis), idiopathic   4. Cervicalgia   5. Congenital scoliosis   6. Bilateral sciatica   7. DDD (degenerative disc disease), lumbar   8. Musculoskeletal back pain   At present she appears to be managing well with her current regimen.  I want her to continue with her opioids and we will renew her prescription for February 6 and her January 7 is currently at pharmacy ready for pickup.  We will have her return  to clinic in 2 months.  I encouraged her to follow-up with her primary care physicians for her diuretic management and to further manage her fluid retention.  If this is not better upon initiating the diuretic I encouraged her to discontinue the Neurontin.  We will have her return to clinic in 2 months for reevaluation and she is to contact us should she have any problems with her pain management or medication management for her pain.  Continue follow-up with her primary care physicians as noted.  Follow Up Instructions:    I discussed the assessment and treatment plan with the patient. The patient was provided an opportunity to ask questions and all were answered. The patient agreed with the plan and demonstrated an understanding of the instructions.   The patient was advised to call back or seek an in-person evaluation if the symptoms worsen or if the condition fails to improve as anticipated.  I provided 30 minutes of non-face-to-face time during this encounter.   Molli Barrows, MD

## 2019-11-09 IMAGING — MG MM DIGITAL SCREENING BILAT W/ TOMO W/ CAD
8 series · 8 of 24 positions shown · non-contrast
Comparison: Previous exam(s).

CLINICAL DATA: Screening.

EXAM:
DIGITAL SCREENING BILATERAL MAMMOGRAM WITH TOMO AND CAD

[R CC synth-2D]
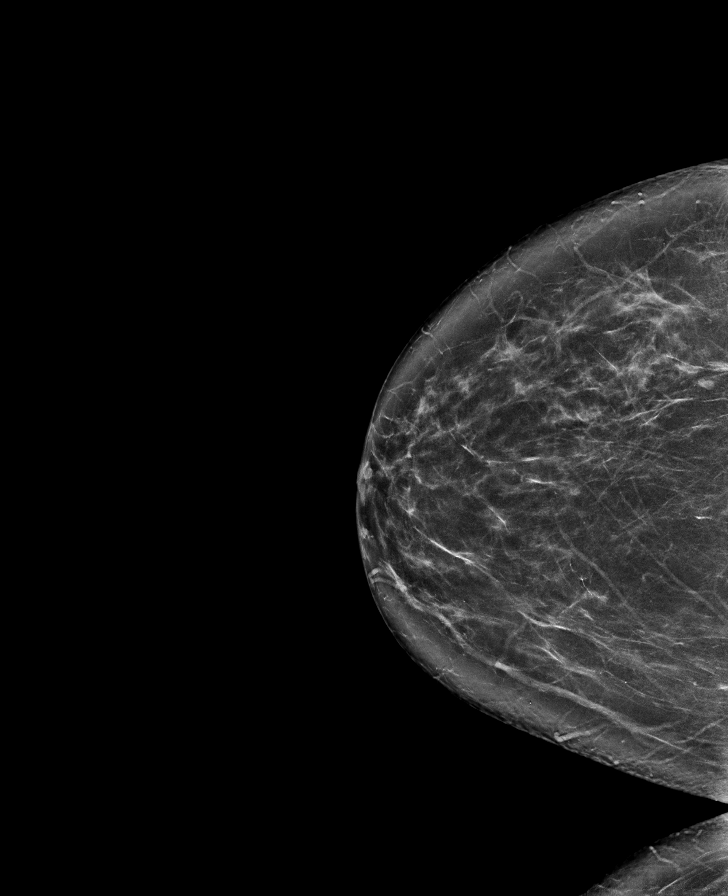

[L CC synth-2D]
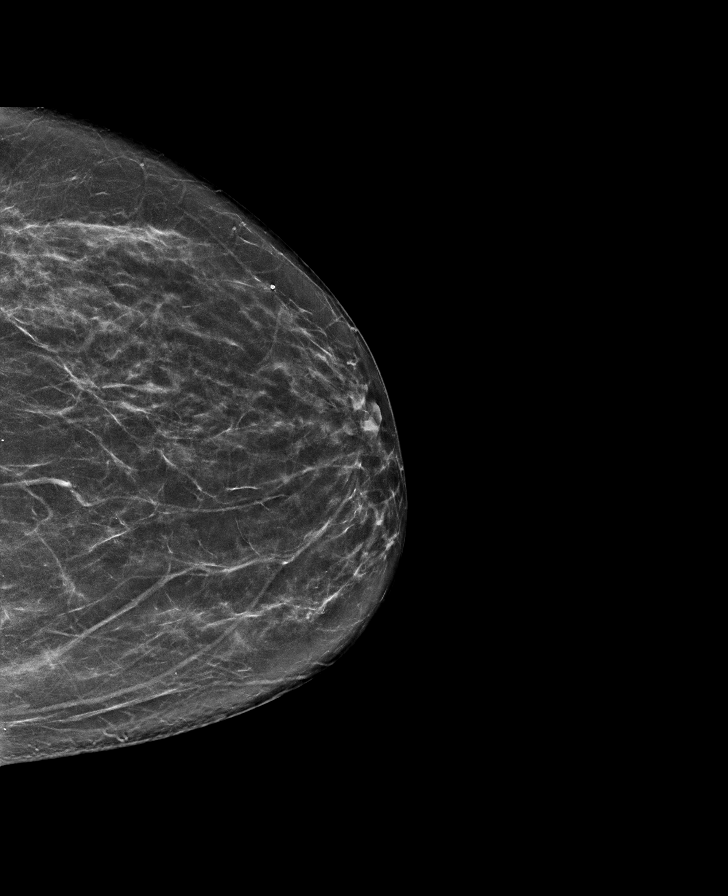

[R MLO synth-2D]
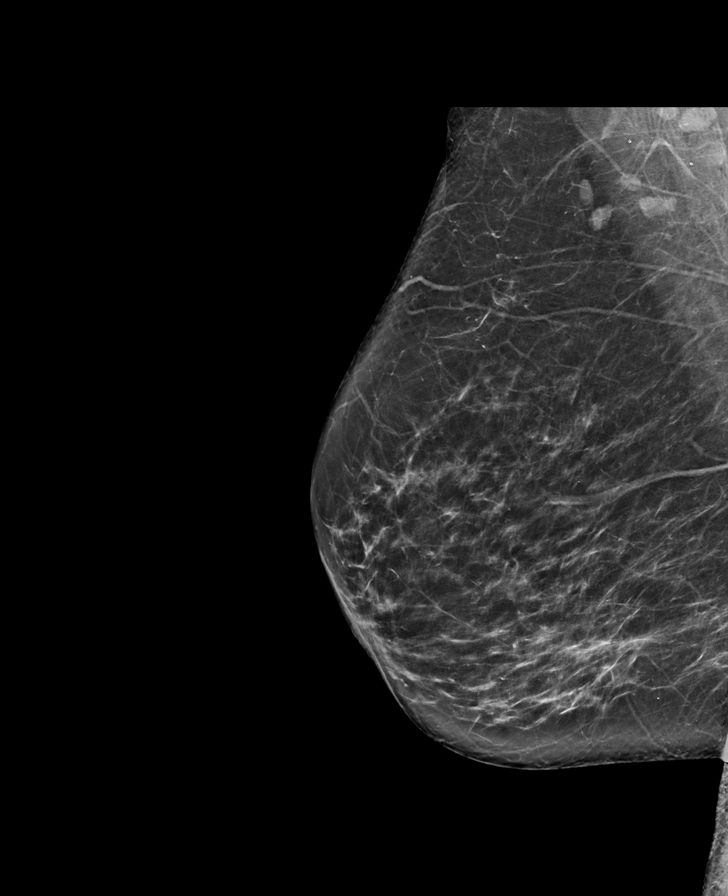

[L MLO synth-2D]
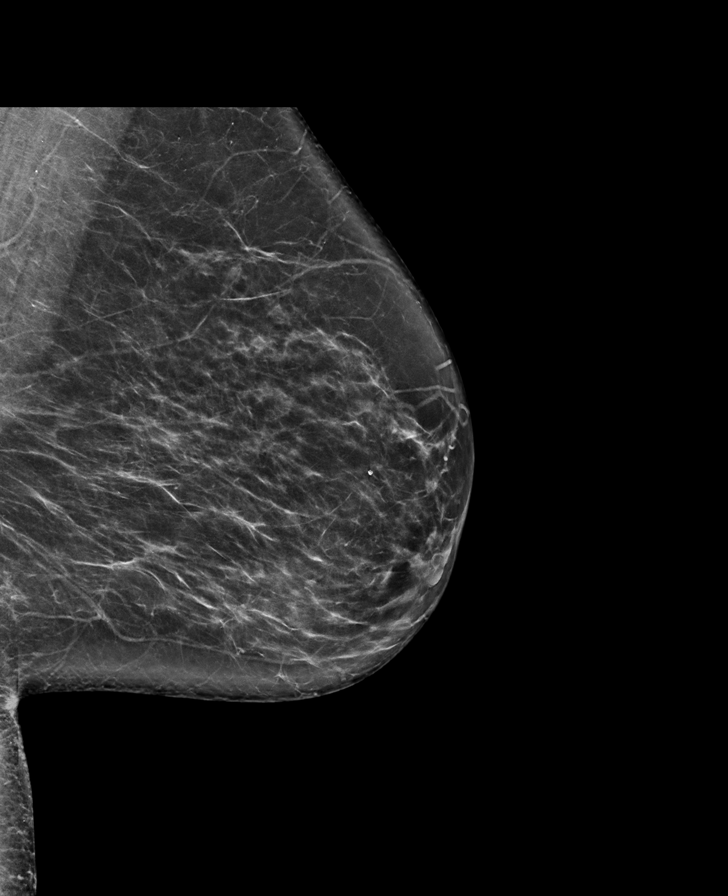

[R CC tomo · tomo slice 43/86.0]
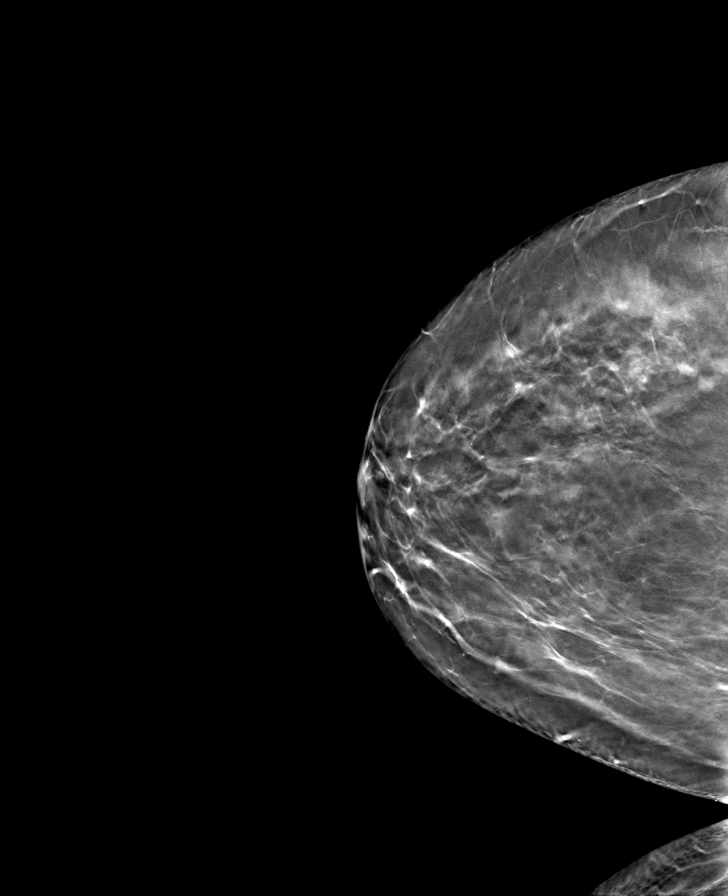

[L CC tomo · tomo slice 37/73.0]
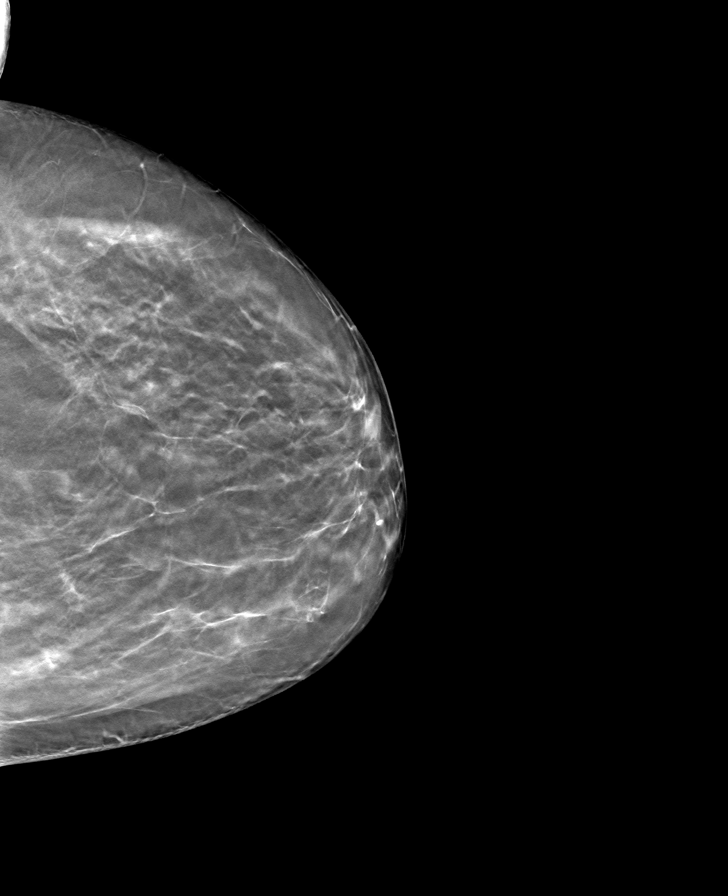

[L MLO tomo · tomo slice 43/84.0]
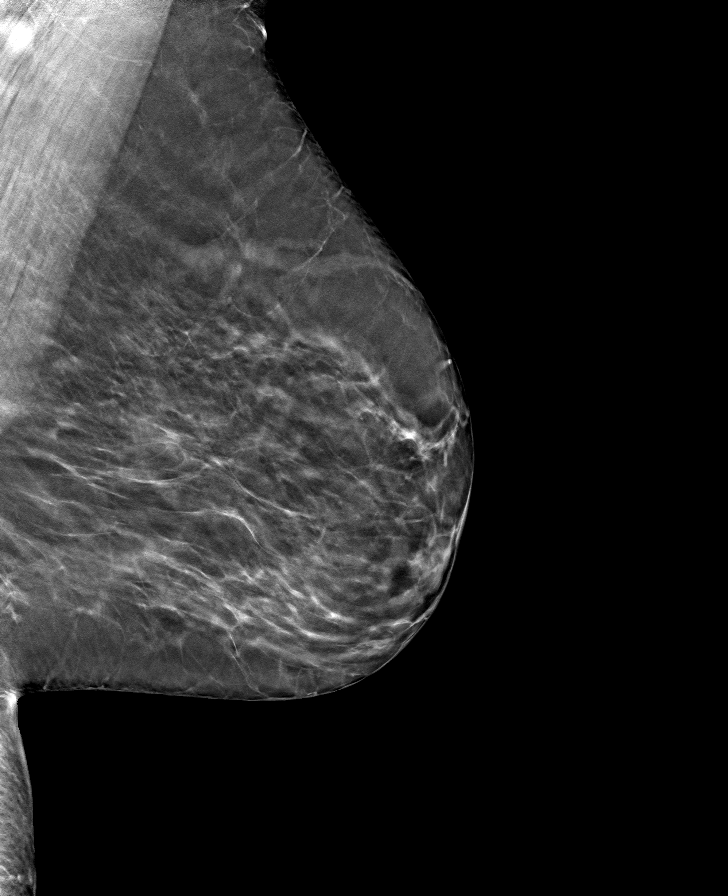

[R MLO tomo · tomo slice 43/84.0]
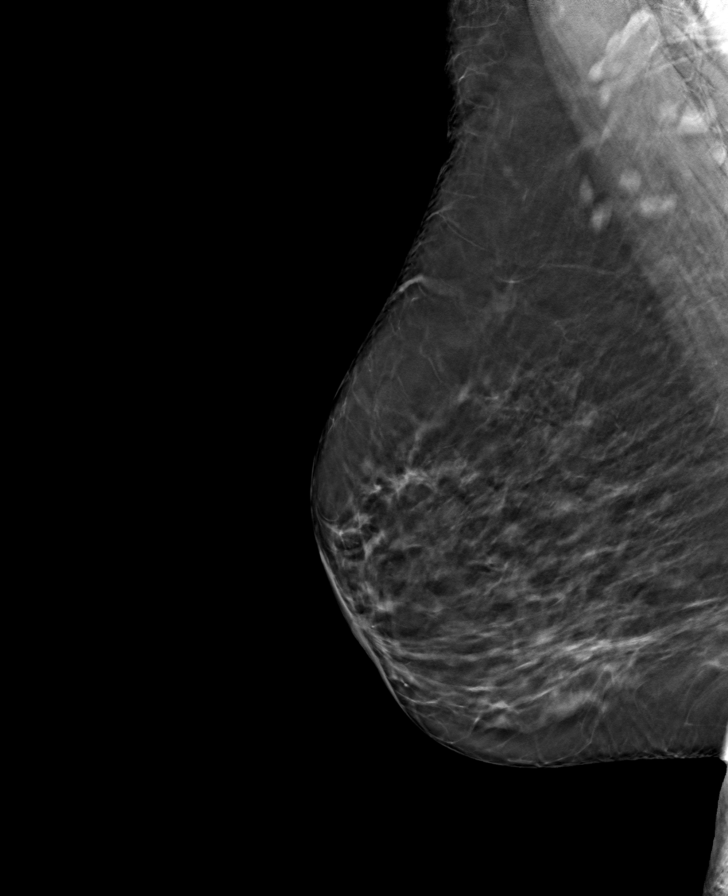

[8 of 24 positions shown; findings below may reference images not displayed]

ACR Breast Density Category b: There are scattered areas of
fibroglandular density.
FINDINGS: There are no findings suspicious for malignancy. Images were
processed with CAD.
IMPRESSION: No mammographic evidence of malignancy. A result letter of this
screening mammogram will be mailed directly to the patient.

RECOMMENDATION:
Screening mammogram in one year. (Code:[id number])

BI-RADS CATEGORY  1: Negative.

## 2019-12-09 ENCOUNTER — Telehealth: Payer: Self-pay | Admitting: *Deleted

## 2019-12-09 NOTE — Telephone Encounter (Signed)
done

## 2019-12-24 ENCOUNTER — Encounter: Payer: Self-pay | Admitting: Anesthesiology

## 2019-12-24 ENCOUNTER — Other Ambulatory Visit: Payer: Self-pay

## 2019-12-24 ENCOUNTER — Ambulatory Visit: Payer: Medicare HMO | Attending: Anesthesiology | Admitting: Anesthesiology

## 2019-12-24 DIAGNOSIS — G894 Chronic pain syndrome: Secondary | ICD-10-CM

## 2019-12-24 DIAGNOSIS — Z79891 Long term (current) use of opiate analgesic: Secondary | ICD-10-CM | POA: Diagnosis not present

## 2019-12-24 DIAGNOSIS — M5431 Sciatica, right side: Secondary | ICD-10-CM

## 2019-12-24 DIAGNOSIS — M542 Cervicalgia: Secondary | ICD-10-CM | POA: Diagnosis not present

## 2019-12-24 DIAGNOSIS — G8929 Other chronic pain: Secondary | ICD-10-CM

## 2019-12-24 DIAGNOSIS — M25551 Pain in right hip: Secondary | ICD-10-CM

## 2019-12-24 DIAGNOSIS — M549 Dorsalgia, unspecified: Secondary | ICD-10-CM

## 2019-12-24 DIAGNOSIS — M5136 Other intervertebral disc degeneration, lumbar region: Secondary | ICD-10-CM

## 2019-12-24 DIAGNOSIS — F1190 Opioid use, unspecified, uncomplicated: Secondary | ICD-10-CM

## 2019-12-24 DIAGNOSIS — Q675 Congenital deformity of spine: Secondary | ICD-10-CM

## 2019-12-24 DIAGNOSIS — M5432 Sciatica, left side: Secondary | ICD-10-CM

## 2019-12-24 DIAGNOSIS — M4120 Other idiopathic scoliosis, site unspecified: Secondary | ICD-10-CM | POA: Diagnosis not present

## 2019-12-24 MED ORDER — BACLOFEN 10 MG PO TABS: 10.0000 mg | ORAL_TABLET | Freq: Two times a day (BID) | ORAL | 3 refills | Status: DC

## 2019-12-24 MED ORDER — OXYCODONE-ACETAMINOPHEN 5-325 MG PO TABS: 1.0000 | ORAL_TABLET | Freq: Three times a day (TID) | ORAL | 0 refills | Status: DC | PRN

## 2019-12-24 MED ORDER — OXYCODONE-ACETAMINOPHEN 5-325 MG PO TABS: 1.0000 | ORAL_TABLET | Freq: Three times a day (TID) | ORAL | 0 refills | Status: AC | PRN

## 2019-12-24 MED ORDER — GABAPENTIN 300 MG PO CAPS: 300.0000 mg | ORAL_CAPSULE | Freq: Three times a day (TID) | ORAL | 3 refills | Status: DC

## 2019-12-25 NOTE — Progress Notes (Signed)
Virtual Visit via Video Note  I connected with Tami Hopkins on 12/25/19 at  1:30 PM EST by a video enabled telemedicine application and verified that I am speaking with the correct person using two identifiers.  Location: Patient: Home Provider: Pain control center   I discussed the limitations of evaluation and management by telemedicine and the availability of in person appointments. The patient expressed understanding and agreed to proceed.  History of Present Illness: I spoke with Tami Hopkins via Administrator today.  She reports that her pain in her back is been stable in nature but she has experienced an increase in her neck pain.  She is still having difficulty with her home situation and has been living on friends couches she reports.  This has caused a significant increase in some of her neck pain.  She is getting some pain in the left and right posterior trapezius muscles and some numbness and tingling affecting the right and left shoulder.  She reports that she has had this in the past but it is more severe as of late.  No weakness in the upper extremities is noted.  Furthermore in the past she has had relief with some physical therapy exercises.  Her lower extremity strength and function is good.  She is taking her medications as prescribed and these work well for her.  She is out of baclofen she reports.  Otherwise her her situation is stable    Observations/Objective:  Current Outpatient Medications:  .  baclofen (LIORESAL) 10 MG tablet, Take 1 tablet (10 mg total) by mouth 2 (two) times daily., Disp: 60 tablet, Rfl: 3 .  clonazePAM (KLONOPIN) 0.5 MG tablet, Take 0.25 mg by mouth daily. , Disp: , Rfl:  .  diphenoxylate-atropine (LOMOTIL) 2.5-0.025 MG per tablet, Take 1 tablet by mouth 4 (four) times daily as needed for diarrhea or loose stools., Disp: , Rfl:  .  FLOVENT HFA 110 MCG/ACT inhaler, Inhale 2 puffs into the lungs at bedtime. , Disp: , Rfl:  .  fluticasone (FLONASE) 50  MCG/ACT nasal spray, Place 1 spray into both nostrils 2 (two) times daily., Disp: , Rfl:  .  furosemide (LASIX) 40 MG tablet, Take 1 tablet by mouth as needed. , Disp: , Rfl:  .  gabapentin (NEURONTIN) 300 MG capsule, Take 1 capsule (300 mg total) by mouth 3 (three) times daily., Disp: 90 capsule, Rfl: 3 .  ipratropium (ATROVENT) 0.06 % nasal spray, Place into the nose., Disp: , Rfl:  .  KLOR-CON 10 10 MEQ tablet, Take 10 mEq by mouth as needed. , Disp: , Rfl: 5 .  montelukast (SINGULAIR) 10 MG tablet, Take 10 mg by mouth at bedtime., Disp: , Rfl:  .  [START ON 12/29/2019] oxyCODONE-acetaminophen (PERCOCET) 5-325 MG tablet, Take 1 tablet by mouth every 8 (eight) hours as needed for moderate pain or severe pain., Disp: 75 tablet, Rfl: 0 .  [START ON 01/28/2020] oxyCODONE-acetaminophen (PERCOCET) 5-325 MG tablet, Take 1 tablet by mouth every 8 (eight) hours as needed for moderate pain or severe pain., Disp: 75 tablet, Rfl: 0 .  PROAIR HFA 108 (90 BASE) MCG/ACT inhaler, Inhale 2 puffs into the lungs every 4 (four) hours as needed., Disp: , Rfl:  .  promethazine (PHENERGAN) 25 MG tablet, Take 50 mg by mouth as needed. , Disp: , Rfl:  .  risperiDONE (RISPERDAL) 1 MG tablet, Take 1 tablet (1 mg total) by mouth at bedtime., Disp: 30 tablet, Rfl: 2 .  topiramate (TOPAMAX) 25 MG  tablet, Take 1 tablet (25 mg total) by mouth 2 (two) times daily., Disp: 60 tablet, Rfl: 0 .  zolpidem (AMBIEN) 10 MG tablet, Take 10 mg by mouth at bedtime as needed for sleep., Disp: , Rfl:   Assessment and Plan:  1. Chronic pain syndrome   2. Chronic, continuous use of opioids   3. Scoliosis (and kyphoscoliosis), idiopathic   4. Cervicalgia   5. Congenital scoliosis   6. Bilateral sciatica   7. DDD (degenerative disc disease), lumbar   8. Musculoskeletal back pain   9. Chronic hip pain, right   Based on review today and upon review of the [facility] practitioner database information when to refill her medications for March  9 and April 8.  I furthermore will refill her baclofen as this is worked well for some of the muscle spasms.  We have talked about some options regarding stretching for the upper extremities and the need for inline positioning for sleep and her neck.  If the pain is not better within the next month she is instructed to contact the pain control center and we may look at a trigger point injection for the spasming.  We will also refill her Neurontin which does help with some of the neuralgic pain that she experiences in the arms and legs.  This is helped her in the past with no side effects reported.  Want her to continue follow-up with her primary care physicians for baseline medical care with a return to clinic in 2 months. Follow Up Instructions:    I discussed the assessment and treatment plan with the patient. The patient was provided an opportunity to ask questions and all were answered. The patient agreed with the plan and demonstrated an understanding of the instructions.   The patient was advised to call back or seek an in-person evaluation if the symptoms worsen or if the condition fails to improve as anticipated.  I provided 30 minutes of non-face-to-face time during this encounter.   Molli Barrows, MD

## 2020-02-25 ENCOUNTER — Other Ambulatory Visit: Payer: Self-pay

## 2020-02-25 ENCOUNTER — Encounter: Payer: Self-pay | Admitting: Anesthesiology

## 2020-02-25 ENCOUNTER — Ambulatory Visit: Payer: Medicare HMO | Attending: Anesthesiology | Admitting: Anesthesiology

## 2020-02-25 DIAGNOSIS — F1190 Opioid use, unspecified, uncomplicated: Secondary | ICD-10-CM

## 2020-02-25 DIAGNOSIS — M549 Dorsalgia, unspecified: Secondary | ICD-10-CM

## 2020-02-25 DIAGNOSIS — M5136 Other intervertebral disc degeneration, lumbar region: Secondary | ICD-10-CM

## 2020-02-25 DIAGNOSIS — M25551 Pain in right hip: Secondary | ICD-10-CM

## 2020-02-25 DIAGNOSIS — G894 Chronic pain syndrome: Secondary | ICD-10-CM

## 2020-02-25 DIAGNOSIS — M5431 Sciatica, right side: Secondary | ICD-10-CM

## 2020-02-25 DIAGNOSIS — G8929 Other chronic pain: Secondary | ICD-10-CM

## 2020-02-25 DIAGNOSIS — M4120 Other idiopathic scoliosis, site unspecified: Secondary | ICD-10-CM | POA: Diagnosis not present

## 2020-02-25 DIAGNOSIS — M542 Cervicalgia: Secondary | ICD-10-CM | POA: Diagnosis not present

## 2020-02-25 DIAGNOSIS — M5432 Sciatica, left side: Secondary | ICD-10-CM

## 2020-02-25 MED ORDER — PREGABALIN 150 MG PO CAPS: 150.0000 mg | ORAL_CAPSULE | Freq: Two times a day (BID) | ORAL | 1 refills | Status: DC

## 2020-02-25 MED ORDER — OXYCODONE-ACETAMINOPHEN 5-325 MG PO TABS: 1.0000 | ORAL_TABLET | Freq: Three times a day (TID) | ORAL | 0 refills | Status: AC | PRN

## 2020-02-25 MED ORDER — OXYCODONE-ACETAMINOPHEN 5-325 MG PO TABS: 1.0000 | ORAL_TABLET | Freq: Three times a day (TID) | ORAL | 0 refills | Status: DC | PRN

## 2020-02-25 NOTE — Progress Notes (Signed)
Virtual Visit via Telephone Note  I connected with Tami Hopkins on 02/25/20 at  1:30 PM EDT by telephone and verified that I am speaking with the correct person using two identifiers.  Location: Patient: Home Provider: Pain control center   I discussed the limitations, risks, security and privacy concerns of performing an evaluation and management service by telephone and the availability of in person appointments. I also discussed with the patient that there may be a patient responsible charge related to this service. The patient expressed understanding and agreed to proceed.   History of Present Illness: I spoke with Tami Hopkins via telephone as she was unable to do the video portion of the virtual conference.  She reports that she continues to have a lot of low back pain and diffuse body pain similar to what she has experienced in the past.  Unfortunately this pain has been quite recalcitrant but stable in nature.  She also states that the opioid medication she takes help with the pain but she still has some breakthrough quality.  She is having a lot of low back spasming type pain as of recent.  She also reports that she has been on gabapentin with limited success but has also been on Lyrica and this was generally more effective for her.  She would like to switch.  No change in bowel or bladder function or lower extremity strength or function is noted at this time.  We did review her 2018 MRI results today over the telephone as well.  Otherwise her symptom complex is stable in nature she is tolerating her opioid medications without difficulty.  She denies any diverting or illicit use and maintains that she continues to derive good functional lifestyle improvement with them per our conversation.  No side effects are reported  Observations/Objective:  Current Outpatient Medications:  .  baclofen (LIORESAL) 10 MG tablet, Take 1 tablet (10 mg total) by mouth 2 (two) times daily., Disp: 60  tablet, Rfl: 3 .  clonazePAM (KLONOPIN) 0.5 MG tablet, Take 0.25 mg by mouth daily. , Disp: , Rfl:  .  diphenoxylate-atropine (LOMOTIL) 2.5-0.025 MG per tablet, Take 1 tablet by mouth 4 (four) times daily as needed for diarrhea or loose stools., Disp: , Rfl:  .  FLOVENT HFA 110 MCG/ACT inhaler, Inhale 2 puffs into the lungs at bedtime. , Disp: , Rfl:  .  fluticasone (FLONASE) 50 MCG/ACT nasal spray, Place 1 spray into both nostrils 2 (two) times daily., Disp: , Rfl:  .  furosemide (LASIX) 40 MG tablet, Take 1 tablet by mouth as needed. , Disp: , Rfl:  .  gabapentin (NEURONTIN) 300 MG capsule, Take 1 capsule (300 mg total) by mouth 3 (three) times daily., Disp: 90 capsule, Rfl: 3 .  ipratropium (ATROVENT) 0.06 % nasal spray, Place into the nose., Disp: , Rfl:  .  KLOR-CON 10 10 MEQ tablet, Take 10 mEq by mouth as needed. , Disp: , Rfl: 5 .  montelukast (SINGULAIR) 10 MG tablet, Take 10 mg by mouth at bedtime., Disp: , Rfl:  .  [START ON 02/28/2020] oxyCODONE-acetaminophen (PERCOCET) 5-325 MG tablet, Take 1 tablet by mouth every 8 (eight) hours as needed for moderate pain or severe pain., Disp: 75 tablet, Rfl: 0 .  [START ON 03/29/2020] oxyCODONE-acetaminophen (PERCOCET) 5-325 MG tablet, Take 1 tablet by mouth every 8 (eight) hours as needed for severe pain., Disp: 75 tablet, Rfl: 0 .  pregabalin (LYRICA) 150 MG capsule, Take 1 capsule (150 mg total) by mouth  2 (two) times daily., Disp: 60 capsule, Rfl: 1 .  PROAIR HFA 108 (90 BASE) MCG/ACT inhaler, Inhale 2 puffs into the lungs every 4 (four) hours as needed., Disp: , Rfl:  .  promethazine (PHENERGAN) 25 MG tablet, Take 50 mg by mouth as needed. , Disp: , Rfl:  .  risperiDONE (RISPERDAL) 1 MG tablet, Take 1 tablet (1 mg total) by mouth at bedtime., Disp: 30 tablet, Rfl: 2 .  topiramate (TOPAMAX) 25 MG tablet, Take 1 tablet (25 mg total) by mouth 2 (two) times daily., Disp: 60 tablet, Rfl: 0 .  zolpidem (AMBIEN) 10 MG tablet, Take 10 mg by mouth at  bedtime as needed for sleep., Disp: , Rfl:   Assessment and Plan:  1. Chronic pain syndrome   2. Chronic, continuous use of opioids   3. Scoliosis (and kyphoscoliosis), idiopathic   4. Cervicalgia   5. Bilateral sciatica   6. DDD (degenerative disc disease), lumbar   7. Musculoskeletal back pain   8. Chronic hip pain, right   Based on the discussion today I think it is appropriate to refill her medicines and this will be done for May 9 and June 8.  This will be for her opioids.  I have reviewed the [facility] practitioner database and it is appropriate.  Furthermore I want her to discontinue the gabapentin we will start her on Lyrica at 150 mg tablets twice a day.  We talked about the potential side effects of these medications and she understands them and will contact us should she have any problems.  In the meantime want her to continue with stretching strengthening exercises as prescribed.  She is also to continue follow-up with her primary care physicians for baseline medical care and we will schedule her for a 1 month return to clinic for further evaluation of some of the musculoskeletal pain in the low back. Follow Up Instructions:    I discussed the assessment and treatment plan with the patient. The patient was provided an opportunity to ask questions and all were answered. The patient agreed with the plan and demonstrated an understanding of the instructions.   The patient was advised to call back or seek an in-person evaluation if the symptoms worsen or if the condition fails to improve as anticipated.  I provided 30 minutes of non-face-to-face time during this encounter.   Molli Barrows, MD

## 2020-02-29 ENCOUNTER — Telehealth: Payer: Self-pay | Admitting: Anesthesiology

## 2020-02-29 NOTE — Telephone Encounter (Signed)
Patient lvmail stating her Lyrica needs PA.

## 2020-03-01 ENCOUNTER — Telehealth: Payer: Self-pay | Admitting: Anesthesiology

## 2020-03-01 NOTE — Telephone Encounter (Signed)
I called [facility], they told me Pregabalin is preferred and does not need a PA. [facility] called [facility], they again told me Pregabalin does need PA. I called patient and asked her to call Medicaid to see what needs to be done.

## 2020-03-01 NOTE — Telephone Encounter (Signed)
Patient called to see if the PA has been sent in for her medications. Pharmacy still says no on filling her meds

## 2020-03-07 ENCOUNTER — Telehealth: Payer: Self-pay

## 2020-03-07 NOTE — Telephone Encounter (Signed)
PA was sent to [facility] and should have been done through Medicare part D. I did a phone PA  with Medicare and should have a faxed answer by 03/10/20. Please see PA form in PA notebook for details (in margin) for follow up if needed.

## 2020-03-07 NOTE — Telephone Encounter (Signed)
The pharmacy states they need prior auth for lyrica. She said she has been going back and forth about it.

## 2020-03-30 ENCOUNTER — Encounter: Payer: Medicare HMO | Admitting: Anesthesiology

## 2020-04-07 ENCOUNTER — Encounter: Payer: Self-pay | Admitting: Anesthesiology

## 2020-04-07 ENCOUNTER — Ambulatory Visit: Payer: Medicare HMO | Attending: Anesthesiology | Admitting: Anesthesiology

## 2020-04-07 ENCOUNTER — Other Ambulatory Visit: Payer: Self-pay

## 2020-04-07 VITALS — BP 123/102 | HR 86 | Temp 97.9°F | Resp 20 | Ht 67.0 in | Wt 171.0 lb

## 2020-04-07 DIAGNOSIS — G894 Chronic pain syndrome: Secondary | ICD-10-CM

## 2020-04-07 DIAGNOSIS — M51369 Other intervertebral disc degeneration, lumbar region without mention of lumbar back pain or lower extremity pain: Secondary | ICD-10-CM

## 2020-04-07 DIAGNOSIS — F1190 Opioid use, unspecified, uncomplicated: Secondary | ICD-10-CM

## 2020-04-07 DIAGNOSIS — M5431 Sciatica, right side: Secondary | ICD-10-CM

## 2020-04-07 DIAGNOSIS — M25551 Pain in right hip: Secondary | ICD-10-CM | POA: Diagnosis present

## 2020-04-07 DIAGNOSIS — M5136 Other intervertebral disc degeneration, lumbar region: Secondary | ICD-10-CM | POA: Diagnosis present

## 2020-04-07 DIAGNOSIS — M5441 Lumbago with sciatica, right side: Secondary | ICD-10-CM | POA: Insufficient documentation

## 2020-04-07 DIAGNOSIS — M5432 Sciatica, left side: Secondary | ICD-10-CM | POA: Diagnosis present

## 2020-04-07 DIAGNOSIS — M542 Cervicalgia: Secondary | ICD-10-CM | POA: Diagnosis not present

## 2020-04-07 DIAGNOSIS — Q675 Congenital deformity of spine: Secondary | ICD-10-CM | POA: Diagnosis present

## 2020-04-07 DIAGNOSIS — G8929 Other chronic pain: Secondary | ICD-10-CM

## 2020-04-07 DIAGNOSIS — M4120 Other idiopathic scoliosis, site unspecified: Secondary | ICD-10-CM

## 2020-04-07 DIAGNOSIS — M549 Dorsalgia, unspecified: Secondary | ICD-10-CM

## 2020-04-07 MED ORDER — BACLOFEN 10 MG PO TABS: 10.0000 mg | ORAL_TABLET | Freq: Two times a day (BID) | ORAL | 3 refills | Status: DC

## 2020-04-07 MED ORDER — OXYCODONE-ACETAMINOPHEN 5-325 MG PO TABS: 1.0000 | ORAL_TABLET | Freq: Three times a day (TID) | ORAL | 0 refills | Status: DC | PRN

## 2020-04-07 MED ORDER — OXYCODONE-ACETAMINOPHEN 5-325 MG PO TABS: 1.0000 | ORAL_TABLET | Freq: Three times a day (TID) | ORAL | 0 refills | Status: AC | PRN

## 2020-04-07 MED ORDER — PREGABALIN 150 MG PO CAPS: 150.0000 mg | ORAL_CAPSULE | Freq: Two times a day (BID) | ORAL | 3 refills | Status: DC

## 2020-04-07 NOTE — Progress Notes (Signed)
Subjective:  Patient ID: Tami Hopkins, female    DOB: [birth year]  Age: 59 y.o. MRN: [id number]  CC: Back Pain (entire)   Procedure: None  HPI Nisha Dhami presents for reevaluation.  She continues to complain of pain in the bilateral shoulders thoracic and lumbar region.  This pain is not unusual for her and has been chronic in nature.  She has spasming in these associated areas and right now it seems as if the neck pain is her primary problem.  She denies any change in upper extremity strength or function.  Despite taking baclofen and Lyrica in addition to her opioid medication she continues to have breakthrough pain.  No change in sensorimotor function or bowel bladder function are noted at this time.  She reports that otherwise she is in her usual state of health.  She also presents with good relief with the opioid medications when she takes them.  She maintains that they enable her to sleep better at night and continue functioning with a seminormal life.  She denies any side effects of the medications based on the narcotic assessment sheet.  Outpatient Medications Prior to Visit  Medication Sig Dispense Refill  . diphenoxylate-atropine (LOMOTIL) 2.5-0.025 MG per tablet Take 1 tablet by mouth 4 (four) times daily as needed for diarrhea or loose stools.    Marland Kitchen FLOVENT HFA 110 MCG/ACT inhaler Inhale 2 puffs into the lungs at bedtime.     . fluticasone (FLONASE) 50 MCG/ACT nasal spray Place 1 spray into both nostrils 2 (two) times daily.    . furosemide (LASIX) 40 MG tablet Take 1 tablet by mouth as needed.     Marland Kitchen KLOR-CON 10 10 MEQ tablet Take 10 mEq by mouth as needed.   5  . montelukast (SINGULAIR) 10 MG tablet Take 10 mg by mouth at bedtime.    . pantoprazole (PROTONIX) 40 MG tablet Take by mouth.    Marland Kitchen PROAIR HFA 108 (90 BASE) MCG/ACT inhaler Inhale 2 puffs into the lungs every 4 (four) hours as needed.    . promethazine (PHENERGAN) 25 MG tablet Take 50 mg by mouth as needed.     .  zolpidem (AMBIEN) 10 MG tablet Take 10 mg by mouth at bedtime as needed for sleep.    . clonazePAM (KLONOPIN) 0.5 MG tablet Take 0.25 mg by mouth daily.     . montelukast (SINGULAIR) 10 MG tablet Take 1 tablet by mouth 1 day or 1 dose.    . oxyCODONE-acetaminophen (PERCOCET) 5-325 MG tablet Take 1 tablet by mouth every 8 (eight) hours as needed for severe pain. 75 tablet 0  . clonazePAM (KLONOPIN) 1 MG tablet Take 1 mg by mouth at bedtime as needed.    . gabapentin (NEURONTIN) 300 MG capsule Take 1 capsule (300 mg total) by mouth 3 (three) times daily. 90 capsule 3  . ipratropium (ATROVENT) 0.06 % nasal spray Place into the nose.    . risperiDONE (RISPERDAL) 1 MG tablet Take 1 tablet (1 mg total) by mouth at bedtime. (Patient not taking: Reported on 04/07/2020) 30 tablet 2  . topiramate (TOPAMAX) 25 MG tablet Take 1 tablet (25 mg total) by mouth 2 (two) times daily. 60 tablet 0  . baclofen (LIORESAL) 10 MG tablet Take 1 tablet (10 mg total) by mouth 2 (two) times daily. 60 tablet 3  . pregabalin (LYRICA) 150 MG capsule Take 1 capsule (150 mg total) by mouth 2 (two) times daily. 60 capsule 1   No facility-administered medications  prior to visit.    Review of Systems CNS: No confusion or sedation Cardiac: No angina or palpitations GI: No abdominal pain or constipation Constitutional: No nausea vomiting fevers or chills  Objective:  BP (!) 123/102 (BP Location: Right Arm, Patient Position: Sitting, Cuff Size: Normal)   Pulse 86   Temp 97.9 F (36.6 C) (Oral)   Resp 20   Ht 5\' 7"  (1.702 m)   Wt 171 lb (77.6 kg)   SpO2 98%   BMI 26.78 kg/m    BP Readings from Last 3 Encounters:  04/07/20 (!) 123/102  11/26/18 105/77  09/23/18 113/83     Wt Readings from Last 3 Encounters:  04/07/20 171 lb (77.6 kg)  11/26/18 149 lb (67.6 kg)  09/23/18 144 lb (65.3 kg)     Physical Exam Pt is alert and oriented PERRL EOMI HEART IS RRR no murmur or rub LCTA no wheezing or  rales MUSCULOSKELETAL reveals 2 trigger points in the bilateral mid body trapezius muscle some tenderness in the mid thoracic region lumbar region as well but no trigger points noted.  She does have a significant right side L2 scoliotic curve.  She is ambulating with a mildly antalgic gait but her muscle tone and bulk to the lower extremities is at baseline and she is ambulating with minimal difficulty.  Labs  No results found for: HGBA1C Lab Results  Component Value Date   CREATININE 0.72 05/14/2015    -------------------------------------------------------------------------------------------------------------------- Lab Results  Component Value Date   WBC 12.0 (H) 05/11/2015   HGB 12.1 05/11/2015   HCT 36.1 05/11/2015   PLT 223 05/11/2015   GLUCOSE 118 (H) 05/14/2015   ALT 148 (H) 05/14/2015   AST 170 (H) 05/14/2015   NA 138 05/14/2015   K 3.7 05/14/2015   CL 105 05/14/2015   CREATININE 0.72 05/14/2015   BUN 11 05/14/2015   CO2 25 05/14/2015   TSH 0.912 05/11/2015    --------------------------------------------------------------------------------------------------------------------- MM 3D SCREEN BREAST BILATERAL  Result Date: 07/24/2019 CLINICAL DATA:  Screening. EXAM: DIGITAL SCREENING BILATERAL MAMMOGRAM WITH TOMO AND CAD COMPARISON:  Previous exam(s). ACR Breast Density Category b: There are scattered areas of fibroglandular density. FINDINGS: There are no findings suspicious for malignancy. Images were processed with CAD. IMPRESSION: No mammographic evidence of malignancy. A result letter of this screening mammogram will be mailed directly to the patient. RECOMMENDATION: Screening mammogram in one year. (Code:[id number]-B-01Y) BI-RADS CATEGORY  1: Negative. Electronically Signed   By: Everlean Alstrom M.D.   On: 07/24/2019 13:26     Assessment & Plan:   Bryona was seen today for back pain.  Diagnoses and all orders for this visit:  Chronic, continuous use of opioids -      ToxASSURE Select 13 (MW), Urine  Chronic pain syndrome -     POCT Urine drug screen -     ToxASSURE Select 13 (MW), Urine  Other orders -     oxyCODONE-acetaminophen (PERCOCET) 5-325 MG tablet; Take 1 tablet by mouth every 8 (eight) hours as needed for severe pain. -     oxyCODONE-acetaminophen (PERCOCET) 5-325 MG tablet; Take 1 tablet by mouth every 8 (eight) hours as needed for moderate pain or severe pain. -     baclofen (LIORESAL) 10 MG tablet; Take 1 tablet (10 mg total) by mouth 2 (two) times daily. -     pregabalin (LYRICA) 150 MG capsule; Take 1 capsule (150 mg total) by mouth 2 (two) times daily.        ----------------------------------------------------------------------------------------------------------------------  Problem List Items Addressed This Visit      Unprioritized   Chronic pain syndrome (Chronic)   Relevant Medications   clonazePAM (KLONOPIN) 1 MG tablet   oxyCODONE-acetaminophen (PERCOCET) 5-325 MG tablet (Start on 04/29/2020)   oxyCODONE-acetaminophen (PERCOCET) 5-325 MG tablet (Start on 05/29/2020)   baclofen (LIORESAL) 10 MG tablet   pregabalin (LYRICA) 150 MG capsule (Start on 04/29/2020)   Other Relevant Orders   POCT Urine drug screen   ToxASSURE Select 13 (MW), Urine    Other Visit Diagnoses    Chronic, continuous use of opioids    -  Primary   Relevant Orders   ToxASSURE Select 13 (MW), Urine        ----------------------------------------------------------------------------------------------------------------------  1. Chronic pain syndrome I have reviewed the [facility] practitioner database information and it is appropriate for refill today.  Refills will be dated for July 9 and August 8.  She continues to get good relief with the medications though no more than 4 to 6 hours before she has recurrence of similar pain.  However she feels that without them her condition would be much worse. - POCT Urine drug screen - ToxASSURE Select 13  (MW), Urine  2. Chronic, continuous use of opioids As above. - ToxASSURE Select 13 (MW), Urine  1. Chronic, continuous use of opioids   2. Chronic pain syndrome   3. Cervicalgia   4. Bilateral sciatica   5. DDD (degenerative disc disease), lumbar   6. Scoliosis (and kyphoscoliosis), idiopathic   7. Musculoskeletal back pain   8. Chronic hip pain, right   9. Congenital scoliosis   10. Chronic right-sided low back pain with right-sided sciatica   Based on the above findings I think it is appropriate to continue her current pain medication regimen.  I have given her an opportunity to proceed with a trigger point injection today but she desires to defer.  I have encouraged her to apply a TENS unit to the cervical and lumbar region to see if this could help and we have gone over this today as well we have also talked about some stretching strengthening exercises that I think could be of benefit.  We will schedule her for a 39-month return to clinic.  ----------------------------------------------------------------------------------------------------------------------  I am having Tami Hopkins start on oxyCODONE-acetaminophen. I am also having her maintain her diphenoxylate-atropine, montelukast, furosemide, fluticasone, ProAir HFA, Klor-Con 10, promethazine, Flovent HFA, risperiDONE, zolpidem, ipratropium, topiramate, gabapentin, clonazePAM, pantoprazole, oxyCODONE-acetaminophen, baclofen, and pregabalin.   Meds ordered this encounter  Medications  . oxyCODONE-acetaminophen (PERCOCET) 5-325 MG tablet    Sig: Take 1 tablet by mouth every 8 (eight) hours as needed for severe pain.    Dispense:  75 tablet    Refill:  0  . oxyCODONE-acetaminophen (PERCOCET) 5-325 MG tablet    Sig: Take 1 tablet by mouth every 8 (eight) hours as needed for moderate pain or severe pain.    Dispense:  75 tablet    Refill:  0  . baclofen (LIORESAL) 10 MG tablet    Sig: Take 1 tablet (10 mg total) by mouth 2  (two) times daily.    Dispense:  60 tablet    Refill:  3  . pregabalin (LYRICA) 150 MG capsule    Sig: Take 1 capsule (150 mg total) by mouth 2 (two) times daily.    Dispense:  60 capsule    Refill:  3   Patient's Medications  New Prescriptions   OXYCODONE-ACETAMINOPHEN (PERCOCET) 5-325 MG TABLET  Take 1 tablet by mouth every 8 (eight) hours as needed for moderate pain or severe pain.  Previous Medications   CLONAZEPAM (KLONOPIN) 1 MG TABLET    Take 1 mg by mouth at bedtime as needed.   DIPHENOXYLATE-ATROPINE (LOMOTIL) 2.5-0.025 MG PER TABLET    Take 1 tablet by mouth 4 (four) times daily as needed for diarrhea or loose stools.   FLOVENT HFA 110 MCG/ACT INHALER    Inhale 2 puffs into the lungs at bedtime.    FLUTICASONE (FLONASE) 50 MCG/ACT NASAL SPRAY    Place 1 spray into both nostrils 2 (two) times daily.   FUROSEMIDE (LASIX) 40 MG TABLET    Take 1 tablet by mouth as needed.    GABAPENTIN (NEURONTIN) 300 MG CAPSULE    Take 1 capsule (300 mg total) by mouth 3 (three) times daily.   IPRATROPIUM (ATROVENT) 0.06 % NASAL SPRAY    Place into the nose.   KLOR-CON 10 10 MEQ TABLET    Take 10 mEq by mouth as needed.    MONTELUKAST (SINGULAIR) 10 MG TABLET    Take 10 mg by mouth at bedtime.   PANTOPRAZOLE (PROTONIX) 40 MG TABLET    Take by mouth.   PROAIR HFA 108 (90 BASE) MCG/ACT INHALER    Inhale 2 puffs into the lungs every 4 (four) hours as needed.   PROMETHAZINE (PHENERGAN) 25 MG TABLET    Take 50 mg by mouth as needed.    RISPERIDONE (RISPERDAL) 1 MG TABLET    Take 1 tablet (1 mg total) by mouth at bedtime.   TOPIRAMATE (TOPAMAX) 25 MG TABLET    Take 1 tablet (25 mg total) by mouth 2 (two) times daily.   ZOLPIDEM (AMBIEN) 10 MG TABLET    Take 10 mg by mouth at bedtime as needed for sleep.  Modified Medications   Modified Medication Previous Medication   BACLOFEN (LIORESAL) 10 MG TABLET baclofen (LIORESAL) 10 MG tablet      Take 1 tablet (10 mg total) by mouth 2 (two) times daily.     Take 1 tablet (10 mg total) by mouth 2 (two) times daily.   OXYCODONE-ACETAMINOPHEN (PERCOCET) 5-325 MG TABLET oxyCODONE-acetaminophen (PERCOCET) 5-325 MG tablet      Take 1 tablet by mouth every 8 (eight) hours as needed for severe pain.    Take 1 tablet by mouth every 8 (eight) hours as needed for severe pain.   PREGABALIN (LYRICA) 150 MG CAPSULE pregabalin (LYRICA) 150 MG capsule      Take 1 capsule (150 mg total) by mouth 2 (two) times daily.    Take 1 capsule (150 mg total) by mouth 2 (two) times daily.  Discontinued Medications   CLONAZEPAM (KLONOPIN) 0.5 MG TABLET    Take 0.25 mg by mouth daily.    MONTELUKAST (SINGULAIR) 10 MG TABLET    Take 1 tablet by mouth 1 day or 1 dose.   ----------------------------------------------------------------------------------------------------------------------  Follow-up: Return in about 2 months (around 06/07/2020) for evaluation, med refill.    Molli Barrows, MD

## 2020-04-07 NOTE — Progress Notes (Signed)
Safety precautions to be maintained throughout the outpatient stay will include: orient to surroundings, keep bed in low position, maintain call bell within reach at all times, provide assistance with transfer out of bed and ambulation.   States diagnosed with agoraphobia. Has gotten worse. Has been homeless since October. Stress is causing triggers. Also has PTSD.  Nursing Pain Medication Assessment:  Safety precautions to be maintained throughout the outpatient stay will include: orient to surroundings, keep bed in low position, maintain call bell within reach at all times, provide assistance with transfer out of bed and ambulation.  Medication Inspection Compliance: Pill count conducted under aseptic conditions, in front of the patient. Neither the pills nor the bottle was removed from the patient's sight at any time. Once count was completed pills were immediately returned to the patient in their original bottle.  Medication: Oxycodone/APAP Pill/Patch Count: 60 of 75 patches remain Pill/Patch Appearance: Markings consistent with prescribed medication Bottle Appearance: Standard pharmacy container. Clearly labeled. Filled Date: 05 / 09 /2021 Last Medication intake:  Today

## 2020-04-12 LAB — TOXASSURE SELECT 13 (MW), URINE

## 2020-06-01 ENCOUNTER — Other Ambulatory Visit: Payer: Self-pay

## 2020-06-01 ENCOUNTER — Ambulatory Visit: Payer: Medicare HMO | Attending: Anesthesiology | Admitting: Anesthesiology

## 2020-06-01 ENCOUNTER — Encounter: Payer: Self-pay | Admitting: Anesthesiology

## 2020-06-01 DIAGNOSIS — M5431 Sciatica, right side: Secondary | ICD-10-CM

## 2020-06-01 DIAGNOSIS — M25551 Pain in right hip: Secondary | ICD-10-CM

## 2020-06-01 DIAGNOSIS — M5136 Other intervertebral disc degeneration, lumbar region: Secondary | ICD-10-CM

## 2020-06-01 DIAGNOSIS — Q675 Congenital deformity of spine: Secondary | ICD-10-CM

## 2020-06-01 DIAGNOSIS — G894 Chronic pain syndrome: Secondary | ICD-10-CM

## 2020-06-01 DIAGNOSIS — M5432 Sciatica, left side: Secondary | ICD-10-CM

## 2020-06-01 DIAGNOSIS — M542 Cervicalgia: Secondary | ICD-10-CM | POA: Diagnosis not present

## 2020-06-01 DIAGNOSIS — F1190 Opioid use, unspecified, uncomplicated: Secondary | ICD-10-CM | POA: Diagnosis not present

## 2020-06-01 DIAGNOSIS — M549 Dorsalgia, unspecified: Secondary | ICD-10-CM

## 2020-06-01 DIAGNOSIS — M4120 Other idiopathic scoliosis, site unspecified: Secondary | ICD-10-CM

## 2020-06-01 DIAGNOSIS — G8929 Other chronic pain: Secondary | ICD-10-CM

## 2020-06-01 MED ORDER — OXYCODONE-ACETAMINOPHEN 5-325 MG PO TABS: 1.0000 | ORAL_TABLET | Freq: Three times a day (TID) | ORAL | 0 refills | Status: DC | PRN

## 2020-06-01 MED ORDER — OXYCODONE-ACETAMINOPHEN 5-325 MG PO TABS: 1.0000 | ORAL_TABLET | Freq: Three times a day (TID) | ORAL | 0 refills | Status: AC | PRN

## 2020-06-01 MED ORDER — GABAPENTIN 300 MG PO CAPS: 300.0000 mg | ORAL_CAPSULE | Freq: Three times a day (TID) | ORAL | 3 refills | Status: DC

## 2020-06-01 MED ORDER — BACLOFEN 10 MG PO TABS: 10.0000 mg | ORAL_TABLET | Freq: Two times a day (BID) | ORAL | 3 refills | Status: DC

## 2020-06-01 NOTE — Progress Notes (Signed)
Virtual Visit via Telephone Note  I connected with Tami Hopkins on 06/01/20 at  1:30 PM EDT by telephone and verified that I am speaking with the correct person using two identifiers.  Location: Patient: Home Provider: Pain control center   I discussed the limitations, risks, security and privacy concerns of performing an evaluation and management service by telephone and the availability of in person appointments. I also discussed with the patient that there may be a patient responsible charge related to this service. The patient expressed understanding and agreed to proceed.   History of Present Illness: I spoke with Tami Hopkins via telephone.  She is not able to do the video portion of the virtual conference but she reports that she has been doing reasonably well.  The quality characteristic and distribution of her low back pain and leg pain have been stable in nature with no reported changes.  She continues to take her baclofen on a 3 times daily basis to help with muscle spasms and her opioids are working well for her pain control.  She denies any diverting or illicit use with the medications or other problems with the medications and no side effects are noted.  The medications continue to give her good relief and keep her active.  Fortunately she has found some remedy with her home situation and this has become more stable which is helping with her overall management as well.  No change in lower extremity strength or function or bowel bladder function is noted at this time.  Otherwise she is doing well her UDS ins June was appropriate    Observations/Objective:  Current Outpatient Medications:  .  baclofen (LIORESAL) 10 MG tablet, Take 1 tablet (10 mg total) by mouth 2 (two) times daily., Disp: 60 tablet, Rfl: 3 .  clonazePAM (KLONOPIN) 1 MG tablet, Take 1 mg by mouth at bedtime as needed., Disp: , Rfl:  .  diphenoxylate-atropine (LOMOTIL) 2.5-0.025 MG per tablet, Take 1 tablet by mouth 4  (four) times daily as needed for diarrhea or loose stools., Disp: , Rfl:  .  FLOVENT HFA 110 MCG/ACT inhaler, Inhale 2 puffs into the lungs at bedtime. , Disp: , Rfl:  .  fluticasone (FLONASE) 50 MCG/ACT nasal spray, Place 1 spray into both nostrils 2 (two) times daily., Disp: , Rfl:  .  furosemide (LASIX) 40 MG tablet, Take 1 tablet by mouth as needed. , Disp: , Rfl:  .  gabapentin (NEURONTIN) 300 MG capsule, Take 1 capsule (300 mg total) by mouth 3 (three) times daily., Disp: 90 capsule, Rfl: 3 .  ipratropium (ATROVENT) 0.06 % nasal spray, Place into the nose., Disp: , Rfl:  .  KLOR-CON 10 10 MEQ tablet, Take 10 mEq by mouth as needed. , Disp: , Rfl: 5 .  montelukast (SINGULAIR) 10 MG tablet, Take 10 mg by mouth at bedtime., Disp: , Rfl:  .  [START ON 06/29/2020] oxyCODONE-acetaminophen (PERCOCET) 5-325 MG tablet, Take 1 tablet by mouth every 8 (eight) hours as needed for moderate pain or severe pain., Disp: 75 tablet, Rfl: 0 .  [START ON 07/29/2020] oxyCODONE-acetaminophen (PERCOCET) 5-325 MG tablet, Take 1 tablet by mouth every 8 (eight) hours as needed for moderate pain or severe pain., Disp: 75 tablet, Rfl: 0 .  pantoprazole (PROTONIX) 40 MG tablet, Take by mouth., Disp: , Rfl:  .  pregabalin (LYRICA) 150 MG capsule, Take 1 capsule (150 mg total) by mouth 2 (two) times daily., Disp: 60 capsule, Rfl: 3 .  PROAIR HFA 108 (  90 BASE) MCG/ACT inhaler, Inhale 2 puffs into the lungs every 4 (four) hours as needed., Disp: , Rfl:  .  promethazine (PHENERGAN) 25 MG tablet, Take 50 mg by mouth as needed. , Disp: , Rfl:  .  risperiDONE (RISPERDAL) 1 MG tablet, Take 1 tablet (1 mg total) by mouth at bedtime. (Patient not taking: Reported on 04/07/2020), Disp: 30 tablet, Rfl: 2 .  topiramate (TOPAMAX) 25 MG tablet, Take 1 tablet (25 mg total) by mouth 2 (two) times daily., Disp: 60 tablet, Rfl: 0 .  zolpidem (AMBIEN) 10 MG tablet, Take 10 mg by mouth at bedtime as needed for sleep., Disp: , Rfl:   Assessment  and Plan: 1. Chronic pain syndrome   2. Chronic, continuous use of opioids   3. Cervicalgia   4. Bilateral sciatica   5. DDD (degenerative disc disease), lumbar   6. Scoliosis (and kyphoscoliosis), idiopathic   7. Musculoskeletal back pain   8. Chronic hip pain, right   9. Congenital scoliosis   Based on our discussion today and upon review of the [facility] practitioner database information going to refill her medicines for the next 2 months.  This would be dated for September 8 and October 8.  We will schedule her for return to clinic in 2 months.  I want her to continue follow-up with her primary care physicians for baseline medical care and she has been instructed to contact us should she have any problems with her pain management in the meantime.  Follow Up Instructions:    I discussed the assessment and treatment plan with the patient. The patient was provided an opportunity to ask questions and all were answered. The patient agreed with the plan and demonstrated an understanding of the instructions.   The patient was advised to call back or seek an in-person evaluation if the symptoms worsen or if the condition fails to improve as anticipated.  I provided 30 minutes of non-face-to-face time during this encounter.   Molli Barrows, MD

## 2020-08-16 ENCOUNTER — Ambulatory Visit: Payer: Medicare HMO | Attending: Anesthesiology | Admitting: Anesthesiology

## 2020-08-16 ENCOUNTER — Other Ambulatory Visit: Payer: Self-pay

## 2020-08-16 DIAGNOSIS — M5432 Sciatica, left side: Secondary | ICD-10-CM

## 2020-08-16 DIAGNOSIS — M5431 Sciatica, right side: Secondary | ICD-10-CM

## 2020-08-16 DIAGNOSIS — Q675 Congenital deformity of spine: Secondary | ICD-10-CM

## 2020-08-16 DIAGNOSIS — M542 Cervicalgia: Secondary | ICD-10-CM

## 2020-08-16 DIAGNOSIS — G894 Chronic pain syndrome: Secondary | ICD-10-CM | POA: Diagnosis not present

## 2020-08-16 DIAGNOSIS — F1190 Opioid use, unspecified, uncomplicated: Secondary | ICD-10-CM

## 2020-08-16 DIAGNOSIS — M549 Dorsalgia, unspecified: Secondary | ICD-10-CM

## 2020-08-16 DIAGNOSIS — M51369 Other intervertebral disc degeneration, lumbar region without mention of lumbar back pain or lower extremity pain: Secondary | ICD-10-CM

## 2020-08-16 DIAGNOSIS — G8929 Other chronic pain: Secondary | ICD-10-CM

## 2020-08-16 DIAGNOSIS — M5136 Other intervertebral disc degeneration, lumbar region: Secondary | ICD-10-CM

## 2020-08-16 DIAGNOSIS — M5441 Lumbago with sciatica, right side: Secondary | ICD-10-CM

## 2020-08-16 DIAGNOSIS — M545 Low back pain, unspecified: Secondary | ICD-10-CM

## 2020-08-16 MED ORDER — BACLOFEN 10 MG PO TABS: 10.0000 mg | ORAL_TABLET | Freq: Two times a day (BID) | ORAL | 3 refills | Status: DC

## 2020-08-16 MED ORDER — OXYCODONE-ACETAMINOPHEN 5-325 MG PO TABS: 1.0000 | ORAL_TABLET | Freq: Three times a day (TID) | ORAL | 0 refills | Status: AC | PRN

## 2020-08-16 MED ORDER — GABAPENTIN 300 MG PO CAPS: 300.0000 mg | ORAL_CAPSULE | Freq: Three times a day (TID) | ORAL | 3 refills | Status: DC

## 2020-08-16 MED ORDER — OXYCODONE-ACETAMINOPHEN 5-325 MG PO TABS: 1.0000 | ORAL_TABLET | Freq: Three times a day (TID) | ORAL | 0 refills | Status: DC | PRN

## 2020-08-16 NOTE — Progress Notes (Signed)
Virtual Visit via Telephone Note  I connected with Tami Hopkins on 08/16/20 at  1:30 PM EDT by telephone and verified that I am speaking with the correct person using two identifiers.  Location: Patient: Home Provider: [facility] control center   I discussed the limitations, risks, security and privacy concerns of performing an evaluation and management service by telephone and the availability of in person appointments. I also discussed with the patient that there may be a patient responsible charge related to this service. The patient expressed understanding and agreed to proceed.   History of Present Illness: I spoke with Tami Hopkins via telephone as she was unable to do the video portion of virtual conference and reports that her back pain has been reasonably well controlled with her twice daily to 3 times daily Percocet tablets.  She is also taking her baclofen for muscle relaxation and these are helping in addition to the stretching exercises that she is performing.  She takes her gabapentin and this combination keeps her back pain under decent control.  No change in the quality characteristic and distribution of the pain is noted.  Her leg strength and lower extremity function in addition to bowel bladder function been stable as well.  The area of pain is primarily in the low back with some radiation of the hips and posterior lateral buttocks.  She has had a recent upper respiratory infection is currently on antibiotics for that as well.   Observations/Objective:  Current Outpatient Medications:  .  baclofen (LIORESAL) 10 MG tablet, Take 1 tablet (10 mg total) by mouth 2 (two) times daily., Disp: 60 tablet, Rfl: 3 .  clonazePAM (KLONOPIN) 1 MG tablet, Take 1 mg by mouth at bedtime as needed., Disp: , Rfl:  .  diphenoxylate-atropine (LOMOTIL) 2.5-0.025 MG per tablet, Take 1 tablet by mouth 4 (four) times daily as needed for diarrhea or loose stools., Disp: , Rfl:  .  FLOVENT HFA 110  MCG/ACT inhaler, Inhale 2 puffs into the lungs at bedtime. , Disp: , Rfl:  .  fluticasone (FLONASE) 50 MCG/ACT nasal spray, Place 1 spray into both nostrils 2 (two) times daily., Disp: , Rfl:  .  furosemide (LASIX) 40 MG tablet, Take 1 tablet by mouth as needed. , Disp: , Rfl:  .  gabapentin (NEURONTIN) 300 MG capsule, Take 1 capsule (300 mg total) by mouth 3 (three) times daily., Disp: 90 capsule, Rfl: 3 .  ipratropium (ATROVENT) 0.06 % nasal spray, Place into the nose., Disp: , Rfl:  .  KLOR-CON 10 10 MEQ tablet, Take 10 mEq by mouth as needed. , Disp: , Rfl: 5 .  montelukast (SINGULAIR) 10 MG tablet, Take 10 mg by mouth at bedtime., Disp: , Rfl:  .  [START ON 08/29/2020] oxyCODONE-acetaminophen (PERCOCET) 5-325 MG tablet, Take 1 tablet by mouth every 8 (eight) hours as needed for moderate pain or severe pain., Disp: 75 tablet, Rfl: 0 .  [START ON 09/28/2020] oxyCODONE-acetaminophen (PERCOCET) 5-325 MG tablet, Take 1 tablet by mouth every 8 (eight) hours as needed for moderate pain or severe pain., Disp: 75 tablet, Rfl: 0 .  pantoprazole (PROTONIX) 40 MG tablet, Take by mouth., Disp: , Rfl:  .  pregabalin (LYRICA) 150 MG capsule, Take 1 capsule (150 mg total) by mouth 2 (two) times daily., Disp: 60 capsule, Rfl: 3 .  PROAIR HFA 108 (90 BASE) MCG/ACT inhaler, Inhale 2 puffs into the lungs every 4 (four) hours as needed., Disp: , Rfl:  .  promethazine (PHENERGAN) 25  MG tablet, Take 50 mg by mouth as needed. , Disp: , Rfl:  .  risperiDONE (RISPERDAL) 1 MG tablet, Take 1 tablet (1 mg total) by mouth at bedtime. (Patient not taking: Reported on 04/07/2020), Disp: 30 tablet, Rfl: 2 .  topiramate (TOPAMAX) 25 MG tablet, Take 1 tablet (25 mg total) by mouth 2 (two) times daily., Disp: 60 tablet, Rfl: 0 .  zolpidem (AMBIEN) 10 MG tablet, Take 10 mg by mouth at bedtime as needed for sleep., Disp: , Rfl:   Assessment and Plan: 1. Chronic pain syndrome   2. Chronic, continuous use of opioids   3. Cervicalgia    4. Bilateral sciatica   5. DDD (degenerative disc disease), lumbar   6. Musculoskeletal back pain   7. Congenital scoliosis   8. Chronic right-sided low back pain with right-sided sciatica   9. Back pain at L4-L5 level   I have reviewed the [facility] practitioner database information and it is appropriate for refills these will be dated for November 8 and December 8.  No changes in her pain management protocol will be initiated.  I encouraged her to continue with back stretching strengthening exercises.  Want her to keep with ambulation and we will refill her baclofen and gabapentin with a schedule return to clinic in 2 months.  I encouraged her to follow-up with her primary care physicians especially regarding her upper respiratory infection.  Follow Up Instructions:    I discussed the assessment and treatment plan with the patient. The patient was provided an opportunity to ask questions and all were answered. The patient agreed with the plan and demonstrated an understanding of the instructions.   The patient was advised to call back or seek an in-person evaluation if the symptoms worsen or if the condition fails to improve as anticipated.  I provided 30 minutes of non-face-to-face time during this encounter.   Molli Barrows, MD

## 2020-08-30 ENCOUNTER — Other Ambulatory Visit: Payer: Self-pay | Admitting: Family Medicine

## 2020-08-30 DIAGNOSIS — Z1231 Encounter for screening mammogram for malignant neoplasm of breast: Secondary | ICD-10-CM

## 2020-10-06 ENCOUNTER — Ambulatory Visit
Admission: RE | Admit: 2020-10-06 | Discharge: 2020-10-06 | Disposition: A | Discharge status: Home / Self Care | Payer: Medicare HMO | Source: Ambulatory Visit | Attending: Family Medicine | Admitting: Family Medicine

## 2020-10-06 ENCOUNTER — Other Ambulatory Visit: Payer: Self-pay

## 2020-10-06 DIAGNOSIS — Z1231 Encounter for screening mammogram for malignant neoplasm of breast: Secondary | ICD-10-CM | POA: Diagnosis not present

## 2020-10-10 ENCOUNTER — Ambulatory Visit: Payer: Medicare HMO | Attending: Anesthesiology | Admitting: Anesthesiology

## 2020-10-10 ENCOUNTER — Other Ambulatory Visit: Payer: Self-pay

## 2020-10-11 ENCOUNTER — Encounter: Payer: Self-pay | Admitting: Anesthesiology

## 2020-10-11 ENCOUNTER — Other Ambulatory Visit: Payer: Self-pay

## 2020-10-11 ENCOUNTER — Ambulatory Visit: Payer: Medicare HMO | Attending: Anesthesiology | Admitting: Anesthesiology

## 2020-10-11 ENCOUNTER — Telehealth: Payer: Self-pay

## 2020-10-11 DIAGNOSIS — M5432 Sciatica, left side: Secondary | ICD-10-CM

## 2020-10-11 DIAGNOSIS — M5431 Sciatica, right side: Secondary | ICD-10-CM | POA: Diagnosis not present

## 2020-10-11 DIAGNOSIS — F1190 Opioid use, unspecified, uncomplicated: Secondary | ICD-10-CM

## 2020-10-11 DIAGNOSIS — G8929 Other chronic pain: Secondary | ICD-10-CM

## 2020-10-11 DIAGNOSIS — Q675 Congenital deformity of spine: Secondary | ICD-10-CM

## 2020-10-11 DIAGNOSIS — M25551 Pain in right hip: Secondary | ICD-10-CM

## 2020-10-11 DIAGNOSIS — M5136 Other intervertebral disc degeneration, lumbar region: Secondary | ICD-10-CM

## 2020-10-11 DIAGNOSIS — M549 Dorsalgia, unspecified: Secondary | ICD-10-CM

## 2020-10-11 DIAGNOSIS — M51369 Other intervertebral disc degeneration, lumbar region without mention of lumbar back pain or lower extremity pain: Secondary | ICD-10-CM

## 2020-10-11 DIAGNOSIS — M542 Cervicalgia: Secondary | ICD-10-CM | POA: Diagnosis not present

## 2020-10-11 DIAGNOSIS — G894 Chronic pain syndrome: Secondary | ICD-10-CM

## 2020-10-11 DIAGNOSIS — M545 Low back pain, unspecified: Secondary | ICD-10-CM

## 2020-10-11 DIAGNOSIS — M5441 Lumbago with sciatica, right side: Secondary | ICD-10-CM

## 2020-10-11 MED ORDER — OXYCODONE-ACETAMINOPHEN 5-325 MG PO TABS: 1.0000 | ORAL_TABLET | Freq: Three times a day (TID) | ORAL | 0 refills | Status: DC | PRN

## 2020-10-11 MED ORDER — OXYCODONE-ACETAMINOPHEN 5-325 MG PO TABS
1.0000 | ORAL_TABLET | Freq: Three times a day (TID) | ORAL | 0 refills | Status: AC | PRN
Start: 2020-10-28 — End: 2020-11-27

## 2020-10-11 MED ORDER — BACLOFEN 10 MG PO TABS: 10.0000 mg | ORAL_TABLET | Freq: Two times a day (BID) | ORAL | 3 refills | Status: DC

## 2020-10-11 MED ORDER — GABAPENTIN 300 MG PO CAPS: 300.0000 mg | ORAL_CAPSULE | Freq: Three times a day (TID) | ORAL | 3 refills | Status: DC

## 2020-10-11 NOTE — Progress Notes (Signed)
Virtual Visit via Telephone Note  I connected with Tami Hopkins on 10/11/20 at  3:00 PM EST by telephone and verified that I am speaking with the correct person using two identifiers.  Location: Patient: Home Provider: Pain control center   I discussed the limitations, risks, security and privacy concerns of performing an evaluation and management service by telephone and the availability of in person appointments. I also discussed with the patient that there may be a patient responsible charge related to this service. The patient expressed understanding and agreed to proceed.   History of Present Illness: I spoke with Tami Hopkins today via telephone as she was unable to do the video portion of the virtual conference and she reports that her low back is been stable in nature.  No significant changes are noted but she is taking her opioids as prescribed and these continue to give her good relief.  She is also taking her gabapentin and baclofen twice a day.  The quality characteristic distribution of the pain is been stable in nature with no recent changes reported.  Otherwise she is in her usual state of health.  No changes in lower extremity strength or function or bowel or bladder function is noted.  She is having some deltoid and posterior trapezius muscle spasms and these have been more severe recently.  She is doing her stretching exercises but despite this this continues and she reports that she has had previous injections for this and these have helped.  She would like to do this at her next visit.  She is requesting this in 2 months.   Observations/Objective:  Current Outpatient Medications:  .  baclofen (LIORESAL) 10 MG tablet, Take 1 tablet (10 mg total) by mouth 2 (two) times daily., Disp: 60 tablet, Rfl: 3 .  clonazePAM (KLONOPIN) 1 MG tablet, Take 1 mg by mouth at bedtime as needed., Disp: , Rfl:  .  diphenoxylate-atropine (LOMOTIL) 2.5-0.025 MG per tablet, Take 1 tablet by  mouth 4 (four) times daily as needed for diarrhea or loose stools., Disp: , Rfl:  .  FLOVENT HFA 110 MCG/ACT inhaler, Inhale 2 puffs into the lungs at bedtime. , Disp: , Rfl:  .  fluticasone (FLONASE) 50 MCG/ACT nasal spray, Place 1 spray into both nostrils 2 (two) times daily., Disp: , Rfl:  .  furosemide (LASIX) 40 MG tablet, Take 1 tablet by mouth as needed. , Disp: , Rfl:  .  gabapentin (NEURONTIN) 300 MG capsule, Take 1 capsule (300 mg total) by mouth 3 (three) times daily., Disp: 90 capsule, Rfl: 3 .  ipratropium (ATROVENT) 0.06 % nasal spray, Place into the nose., Disp: , Rfl:  .  KLOR-CON 10 10 MEQ tablet, Take 10 mEq by mouth as needed. , Disp: , Rfl: 5 .  montelukast (SINGULAIR) 10 MG tablet, Take 10 mg by mouth at bedtime., Disp: , Rfl:  .  [START ON 10/28/2020] oxyCODONE-acetaminophen (PERCOCET) 5-325 MG tablet, Take 1 tablet by mouth every 8 (eight) hours as needed for moderate pain or severe pain., Disp: 75 tablet, Rfl: 0 .  [START ON 11/27/2020] oxyCODONE-acetaminophen (PERCOCET) 5-325 MG tablet, Take 1 tablet by mouth every 8 (eight) hours as needed for severe pain., Disp: 75 tablet, Rfl: 0 .  pantoprazole (PROTONIX) 40 MG tablet, Take by mouth., Disp: , Rfl:  .  pregabalin (LYRICA) 150 MG capsule, Take 1 capsule (150 mg total) by mouth 2 (two) times daily., Disp: 60 capsule, Rfl: 3 .  PROAIR HFA 108 (90 BASE)  MCG/ACT inhaler, Inhale 2 puffs into the lungs every 4 (four) hours as needed., Disp: , Rfl:  .  promethazine (PHENERGAN) 25 MG tablet, Take 50 mg by mouth as needed. , Disp: , Rfl:  .  risperiDONE (RISPERDAL) 1 MG tablet, Take 1 tablet (1 mg total) by mouth at bedtime. (Patient not taking: Reported on 04/07/2020), Disp: 30 tablet, Rfl: 2 .  topiramate (TOPAMAX) 25 MG tablet, Take 1 tablet (25 mg total) by mouth 2 (two) times daily., Disp: 60 tablet, Rfl: 0 .  zolpidem (AMBIEN) 10 MG tablet, Take 10 mg by mouth at bedtime as needed for sleep., Disp: , Rfl:   Assessment and  Plan: 1. Chronic pain syndrome   2. Chronic, continuous use of opioids   3. Cervicalgia   4. Bilateral sciatica   5. DDD (degenerative disc disease), lumbar   6. Musculoskeletal back pain   7. Congenital scoliosis   8. Chronic right-sided low back pain with right-sided sciatica   9. Back pain at L4-L5 level   10. Chronic hip pain, right   Based on our discussion today and upon review of the [facility] practitioner database information I think is appropriate for refills today.  These will be dated for the next 2 months for January 7 and February 6.  I have encouraged her to continue with her stretching and strengthening exercises.  Continue core strengthening and walking.  In regards to her neck spasming we have talked previously about special pillows and avoiding certain activities.  I will schedule her for trigger point injections in approximately 2 months if not sooner if needed.  She is instructed to call for an earlier appointment if necessary.  Continue current medication management.  I also encouraged her to continue follow-up with her primary care physicians for baseline medical care.  Follow Up Instructions:    I discussed the assessment and treatment plan with the patient. The patient was provided an opportunity to ask questions and all were answered. The patient agreed with the plan and demonstrated an understanding of the instructions.   The patient was advised to call back or seek an in-person evaluation if the symptoms worsen or if the condition fails to improve as anticipated.  I provided 30 minutes of non-face-to-face time during this encounter.   Molli Barrows, MD

## 2020-10-25 NOTE — Telephone Encounter (Signed)
No message

## 2020-12-07 ENCOUNTER — Other Ambulatory Visit: Payer: Self-pay

## 2020-12-07 ENCOUNTER — Encounter: Payer: Self-pay | Admitting: Anesthesiology

## 2020-12-07 ENCOUNTER — Ambulatory Visit: Payer: Medicare HMO | Attending: Anesthesiology | Admitting: Anesthesiology

## 2020-12-07 DIAGNOSIS — M549 Dorsalgia, unspecified: Secondary | ICD-10-CM

## 2020-12-07 DIAGNOSIS — M5136 Other intervertebral disc degeneration, lumbar region: Secondary | ICD-10-CM

## 2020-12-07 DIAGNOSIS — Q675 Congenital deformity of spine: Secondary | ICD-10-CM

## 2020-12-07 DIAGNOSIS — F1190 Opioid use, unspecified, uncomplicated: Secondary | ICD-10-CM | POA: Diagnosis not present

## 2020-12-07 DIAGNOSIS — M25551 Pain in right hip: Secondary | ICD-10-CM

## 2020-12-07 DIAGNOSIS — M5441 Lumbago with sciatica, right side: Secondary | ICD-10-CM

## 2020-12-07 DIAGNOSIS — M542 Cervicalgia: Secondary | ICD-10-CM | POA: Diagnosis not present

## 2020-12-07 DIAGNOSIS — M545 Low back pain, unspecified: Secondary | ICD-10-CM

## 2020-12-07 DIAGNOSIS — M5431 Sciatica, right side: Secondary | ICD-10-CM

## 2020-12-07 DIAGNOSIS — G8929 Other chronic pain: Secondary | ICD-10-CM

## 2020-12-07 DIAGNOSIS — G894 Chronic pain syndrome: Secondary | ICD-10-CM

## 2020-12-07 DIAGNOSIS — M5432 Sciatica, left side: Secondary | ICD-10-CM

## 2020-12-07 MED ORDER — GABAPENTIN 300 MG PO CAPS: 300.0000 mg | ORAL_CAPSULE | Freq: Three times a day (TID) | ORAL | 3 refills | Status: DC

## 2020-12-07 MED ORDER — OXYCODONE-ACETAMINOPHEN 5-325 MG PO TABS: 1.0000 | ORAL_TABLET | Freq: Three times a day (TID) | ORAL | 0 refills | Status: DC | PRN

## 2020-12-07 MED ORDER — BACLOFEN 10 MG PO TABS: 10.0000 mg | ORAL_TABLET | Freq: Two times a day (BID) | ORAL | 3 refills | Status: DC

## 2020-12-07 MED ORDER — OXYCODONE-ACETAMINOPHEN 5-325 MG PO TABS: 1.0000 | ORAL_TABLET | Freq: Three times a day (TID) | ORAL | 0 refills | Status: AC | PRN

## 2020-12-11 NOTE — Progress Notes (Signed)
Virtual Visit via Telephone Note  I connected with Tami Hopkins on 12/11/20 at  2:00 PM EST by telephone and verified that I am speaking with the correct person using two identifiers.  Location: Patient: Home Provider: Pain control center   I discussed the limitations, risks, security and privacy concerns of performing an evaluation and management service by telephone and the availability of in person appointments. I also discussed with the patient that there may be a patient responsible charge related to this service. The patient expressed understanding and agreed to proceed.   History of Present Illness:   I spoke with Tami Hopkins via telephone.  She was unable to do the video portion of the virtual conference.  She has found better rooming accommodations and is staying with a friend.  This is help with her overall pain management she reports.  She still taking her medications as prescribed and the current regimen is working well for her.  She is getting good control of her pain throughout the day with this regimen.  No diverting or illicit use is noted and the quality characteristic and distribution of the pain is been stable with persistent pain in the low back occasionally in the neck which is worse with activity but consistent with her baseline.  Her strength is been stable with no problems with bowel or bladder function.  No side effects with the medications are reported.  Otherwise she is in her usual state of health. Observations/Objective:  Current Outpatient Medications:  .  [START ON 01/26/2021] oxyCODONE-acetaminophen (PERCOCET) 5-325 MG tablet, Take 1 tablet by mouth every 8 (eight) hours as needed for moderate pain or severe pain., Disp: 75 tablet, Rfl: 0 .  baclofen (LIORESAL) 10 MG tablet, Take 1 tablet (10 mg total) by mouth 2 (two) times daily., Disp: 60 tablet, Rfl: 3 .  clonazePAM (KLONOPIN) 1 MG tablet, Take 1 mg by mouth at bedtime as needed., Disp: , Rfl:  .   diphenoxylate-atropine (LOMOTIL) 2.5-0.025 MG per tablet, Take 1 tablet by mouth 4 (four) times daily as needed for diarrhea or loose stools., Disp: , Rfl:  .  FLOVENT HFA 110 MCG/ACT inhaler, Inhale 2 puffs into the lungs at bedtime. , Disp: , Rfl:  .  fluticasone (FLONASE) 50 MCG/ACT nasal spray, Place 1 spray into both nostrils 2 (two) times daily., Disp: , Rfl:  .  furosemide (LASIX) 40 MG tablet, Take 1 tablet by mouth as needed. , Disp: , Rfl:  .  gabapentin (NEURONTIN) 300 MG capsule, Take 1 capsule (300 mg total) by mouth 3 (three) times daily., Disp: 90 capsule, Rfl: 3 .  ipratropium (ATROVENT) 0.06 % nasal spray, Place into the nose., Disp: , Rfl:  .  KLOR-CON 10 10 MEQ tablet, Take 10 mEq by mouth as needed. , Disp: , Rfl: 5 .  montelukast (SINGULAIR) 10 MG tablet, Take 10 mg by mouth at bedtime., Disp: , Rfl:  .  [START ON 12/27/2020] oxyCODONE-acetaminophen (PERCOCET) 5-325 MG tablet, Take 1 tablet by mouth every 8 (eight) hours as needed for severe pain., Disp: 75 tablet, Rfl: 0 .  pantoprazole (PROTONIX) 40 MG tablet, Take by mouth., Disp: , Rfl:  .  pregabalin (LYRICA) 150 MG capsule, Take 1 capsule (150 mg total) by mouth 2 (two) times daily., Disp: 60 capsule, Rfl: 3 .  PROAIR HFA 108 (90 BASE) MCG/ACT inhaler, Inhale 2 puffs into the lungs every 4 (four) hours as needed., Disp: , Rfl:  .  promethazine (PHENERGAN) 25 MG tablet, Take  50 mg by mouth as needed. , Disp: , Rfl:  .  risperiDONE (RISPERDAL) 1 MG tablet, Take 1 tablet (1 mg total) by mouth at bedtime. (Patient not taking: Reported on 04/07/2020), Disp: 30 tablet, Rfl: 2 .  topiramate (TOPAMAX) 25 MG tablet, Take 1 tablet (25 mg total) by mouth 2 (two) times daily., Disp: 60 tablet, Rfl: 0 .  zolpidem (AMBIEN) 10 MG tablet, Take 10 mg by mouth at bedtime as needed for sleep., Disp: , Rfl:   Assessment and Plan: 1. Chronic pain syndrome   2. Chronic, continuous use of opioids   3. Cervicalgia   4. Bilateral sciatica   5.  DDD (degenerative disc disease), lumbar   6. Musculoskeletal back pain   7. Congenital scoliosis   8. Chronic right-sided low back pain with right-sided sciatica   9. Back pain at L4-L5 level   10. Chronic hip pain, right   Based on our discussion today and upon review of the [facility] practitioner database information it is appropriate to refill her medications this be dated from March 8 and April 7.  She does not desire to proceed with any repeat interventional therapy or epidural injections which she is done in the past.  I want her to continue with her stretching strengthening exercises as reviewed today.  Continue with current medication management and continue follow-up with her primary care physicians for baseline medical care.  No other changes are noted or initiated in her pain management protocol.  We will schedule her for return to clinic in 2 months.  Follow Up Instructions:    I discussed the assessment and treatment plan with the patient. The patient was provided an opportunity to ask questions and all were answered. The patient agreed with the plan and demonstrated an understanding of the instructions.   The patient was advised to call back or seek an in-person evaluation if the symptoms worsen or if the condition fails to improve as anticipated.  I provided 30 minutes of non-face-to-face time during this encounter.   Molli Barrows, MD

## 2021-02-20 ENCOUNTER — Other Ambulatory Visit: Payer: Self-pay

## 2021-02-20 ENCOUNTER — Encounter: Payer: Self-pay | Admitting: Anesthesiology

## 2021-02-20 ENCOUNTER — Ambulatory Visit: Payer: Medicare HMO | Attending: Anesthesiology | Admitting: Anesthesiology

## 2021-02-20 DIAGNOSIS — M542 Cervicalgia: Secondary | ICD-10-CM | POA: Diagnosis not present

## 2021-02-20 DIAGNOSIS — M4697 Unspecified inflammatory spondylopathy, lumbosacral region: Secondary | ICD-10-CM

## 2021-02-20 DIAGNOSIS — F1190 Opioid use, unspecified, uncomplicated: Secondary | ICD-10-CM | POA: Diagnosis not present

## 2021-02-20 DIAGNOSIS — G894 Chronic pain syndrome: Secondary | ICD-10-CM | POA: Diagnosis not present

## 2021-02-20 DIAGNOSIS — M549 Dorsalgia, unspecified: Secondary | ICD-10-CM

## 2021-02-20 DIAGNOSIS — M5431 Sciatica, right side: Secondary | ICD-10-CM

## 2021-02-20 DIAGNOSIS — M5136 Other intervertebral disc degeneration, lumbar region: Secondary | ICD-10-CM

## 2021-02-20 DIAGNOSIS — M5441 Lumbago with sciatica, right side: Secondary | ICD-10-CM

## 2021-02-20 DIAGNOSIS — M25551 Pain in right hip: Secondary | ICD-10-CM

## 2021-02-20 DIAGNOSIS — M545 Low back pain, unspecified: Secondary | ICD-10-CM

## 2021-02-20 DIAGNOSIS — M51369 Other intervertebral disc degeneration, lumbar region without mention of lumbar back pain or lower extremity pain: Secondary | ICD-10-CM

## 2021-02-20 DIAGNOSIS — G8929 Other chronic pain: Secondary | ICD-10-CM

## 2021-02-20 DIAGNOSIS — M5432 Sciatica, left side: Secondary | ICD-10-CM

## 2021-02-20 DIAGNOSIS — Q675 Congenital deformity of spine: Secondary | ICD-10-CM

## 2021-02-20 MED ORDER — BACLOFEN 10 MG PO TABS: 10.0000 mg | ORAL_TABLET | Freq: Two times a day (BID) | ORAL | 3 refills | Status: DC

## 2021-02-20 MED ORDER — GABAPENTIN 300 MG PO CAPS: 300.0000 mg | ORAL_CAPSULE | Freq: Four times a day (QID) | ORAL | 3 refills | Status: DC

## 2021-02-20 MED ORDER — OXYCODONE-ACETAMINOPHEN 5-325 MG PO TABS: 1.0000 | ORAL_TABLET | Freq: Three times a day (TID) | ORAL | 0 refills | Status: AC | PRN

## 2021-02-20 MED ORDER — OXYCODONE-ACETAMINOPHEN 5-325 MG PO TABS
1.0000 | ORAL_TABLET | Freq: Three times a day (TID) | ORAL | 0 refills | Status: AC | PRN
Start: 2021-02-25 — End: 2021-03-27

## 2021-02-20 NOTE — Progress Notes (Signed)
Virtual Visit via Telephone Note  I connected with Tami Hopkins on 02/20/21 at  1:20 PM EDT by telephone and verified that I am speaking with the correct person using two identifiers.  Location: Patient: Home Provider: Pain control center   I discussed the limitations, risks, security and privacy concerns of performing an evaluation and management service by telephone and the availability of in person appointments. I also discussed with the patient that there may be a patient responsible charge related to this service. The patient expressed understanding and agreed to proceed.   History of Present Illness: I spoke with Tami Hopkins today via telephone as she was unable to do the video portion of the virtual conference.  She reports that she has had some more spasming problems in the low back with some radiation into both posterior lateral legs.  She reports that in the past she has had epidural steroid injections but this made her pain worse.  She is inquiring as to possibly increasing her gabapentin at bedtime to help with some of the nerve pain that she has experienced.  She tolerates this medication well and otherwise is doing well with her medication management.  With the opioid medication she generally gets 4 to 6 hours of good relief before she has recurrence of her same quality pain.  The gabapentin gives her good relief as well and this combination has worked for her for a continued period of time.  Unfortunately more conservative therapy was ineffective for her.  No change in the quality or characteristic of the pain is otherwise noted it is just that she is having more spasming as of recent.  She has not been doing her stretching as frequently as in the past.  Bowel bladder functions been stable with no change in lower extremity strength or function.   Observations/Objective:  Current Outpatient Medications:  .  [START ON 03/27/2021] oxyCODONE-acetaminophen (PERCOCET) 5-325 MG tablet,  Take 1 tablet by mouth every 8 (eight) hours as needed for severe pain., Disp: 75 tablet, Rfl: 0 .  baclofen (LIORESAL) 10 MG tablet, Take 1 tablet (10 mg total) by mouth 2 (two) times daily., Disp: 60 tablet, Rfl: 3 .  clonazePAM (KLONOPIN) 1 MG tablet, Take 1 mg by mouth at bedtime as needed., Disp: , Rfl:  .  diphenoxylate-atropine (LOMOTIL) 2.5-0.025 MG per tablet, Take 1 tablet by mouth 4 (four) times daily as needed for diarrhea or loose stools., Disp: , Rfl:  .  FLOVENT HFA 110 MCG/ACT inhaler, Inhale 2 puffs into the lungs at bedtime. , Disp: , Rfl:  .  fluticasone (FLONASE) 50 MCG/ACT nasal spray, Place 1 spray into both nostrils 2 (two) times daily., Disp: , Rfl:  .  furosemide (LASIX) 40 MG tablet, Take 1 tablet by mouth as needed. , Disp: , Rfl:  .  gabapentin (NEURONTIN) 300 MG capsule, Take 1 capsule (300 mg total) by mouth 4 (four) times daily., Disp: 120 capsule, Rfl: 3 .  ipratropium (ATROVENT) 0.06 % nasal spray, Place into the nose., Disp: , Rfl:  .  KLOR-CON 10 10 MEQ tablet, Take 10 mEq by mouth as needed. , Disp: , Rfl: 5 .  montelukast (SINGULAIR) 10 MG tablet, Take 10 mg by mouth at bedtime., Disp: , Rfl:  .  [START ON 02/25/2021] oxyCODONE-acetaminophen (PERCOCET) 5-325 MG tablet, Take 1 tablet by mouth every 8 (eight) hours as needed for moderate pain or severe pain., Disp: 90 tablet, Rfl: 0 .  pantoprazole (PROTONIX) 40 MG tablet, Take  by mouth., Disp: , Rfl:  .  pregabalin (LYRICA) 150 MG capsule, Take 1 capsule (150 mg total) by mouth 2 (two) times daily., Disp: 60 capsule, Rfl: 3 .  PROAIR HFA 108 (90 BASE) MCG/ACT inhaler, Inhale 2 puffs into the lungs every 4 (four) hours as needed., Disp: , Rfl:  .  promethazine (PHENERGAN) 25 MG tablet, Take 50 mg by mouth as needed. , Disp: , Rfl:  .  risperiDONE (RISPERDAL) 1 MG tablet, Take 1 tablet (1 mg total) by mouth at bedtime. (Patient not taking: Reported on 04/07/2020), Disp: 30 tablet, Rfl: 2 .  topiramate (TOPAMAX) 25 MG  tablet, Take 1 tablet (25 mg total) by mouth 2 (two) times daily., Disp: 60 tablet, Rfl: 0 .  zolpidem (AMBIEN) 10 MG tablet, Take 10 mg by mouth at bedtime as needed for sleep., Disp: , Rfl:   Assessment and Plan: 1. Chronic pain syndrome   2. Chronic, continuous use of opioids   3. Cervicalgia   4. Bilateral sciatica   5. DDD (degenerative disc disease), lumbar   6. Musculoskeletal back pain   7. Congenital scoliosis   8. Chronic right-sided low back pain with right-sided sciatica   9. Back pain at L4-L5 level   10. 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  I am going to increase her opioid to 3 times daily dosing for this next month to see if this can help her get over the current hurdle with her low back pain.  I am also going to increase her gabapentin to 2 tablets at bedtime.  This would be for 120 tablets/month.  This regimen is working well for her and she reports no side effects.  Based on our discussion and review she continues to derive good functional lifestyle provement with the medicines with no side effect.  Lastly I will refill her baclofen today.  She is currently working on getting her home health situation stabilized and this has been a trying problem for her.  She refuses any type of interventional therapy secondary to having had problems with a epidural injection in the distant past.  I have encouraged her to continue follow-up with her primary care physician as well as continue with the stretching strengthening exercises as reviewed today.  She is to present for routine UDS sometime in the next 2 to 3 weeks.  She will be scheduled for 9-month return to clinic.  Follow Up Instructions:    I discussed the assessment and treatment plan with the patient. The patient was provided an opportunity to ask questions  and all were answered. The patient agreed with the plan and demonstrated an understanding of the instructions.   The patient was advised to call back or seek an in-person evaluation if the symptoms worsen or if the condition fails to improve as anticipated.  I provided 30 minutes of non-face-to-face time during this encounter.   Molli Barrows, MD

## 2021-05-10 ENCOUNTER — Other Ambulatory Visit: Payer: Self-pay

## 2021-05-10 ENCOUNTER — Ambulatory Visit: Payer: Medicare HMO | Attending: Anesthesiology | Admitting: Anesthesiology

## 2021-05-10 ENCOUNTER — Encounter: Payer: Self-pay | Admitting: Anesthesiology

## 2021-05-10 DIAGNOSIS — M5431 Sciatica, right side: Secondary | ICD-10-CM | POA: Diagnosis not present

## 2021-05-10 DIAGNOSIS — Q675 Congenital deformity of spine: Secondary | ICD-10-CM

## 2021-05-10 DIAGNOSIS — M5136 Other intervertebral disc degeneration, lumbar region: Secondary | ICD-10-CM

## 2021-05-10 DIAGNOSIS — G894 Chronic pain syndrome: Secondary | ICD-10-CM

## 2021-05-10 DIAGNOSIS — F1190 Opioid use, unspecified, uncomplicated: Secondary | ICD-10-CM | POA: Diagnosis not present

## 2021-05-10 DIAGNOSIS — M549 Dorsalgia, unspecified: Secondary | ICD-10-CM

## 2021-05-10 DIAGNOSIS — M545 Low back pain, unspecified: Secondary | ICD-10-CM

## 2021-05-10 DIAGNOSIS — M542 Cervicalgia: Secondary | ICD-10-CM

## 2021-05-10 DIAGNOSIS — M5441 Lumbago with sciatica, right side: Secondary | ICD-10-CM

## 2021-05-10 DIAGNOSIS — M5432 Sciatica, left side: Secondary | ICD-10-CM

## 2021-05-10 DIAGNOSIS — G8929 Other chronic pain: Secondary | ICD-10-CM

## 2021-05-10 MED ORDER — OXYCODONE-ACETAMINOPHEN 5-325 MG PO TABS: 1.0000 | ORAL_TABLET | Freq: Three times a day (TID) | ORAL | 0 refills | Status: DC | PRN

## 2021-05-10 MED ORDER — BACLOFEN 10 MG PO TABS: 10.0000 mg | ORAL_TABLET | Freq: Two times a day (BID) | ORAL | 3 refills | Status: DC

## 2021-05-10 MED ORDER — OXYCODONE-ACETAMINOPHEN 5-325 MG PO TABS: 1.0000 | ORAL_TABLET | Freq: Three times a day (TID) | ORAL | 0 refills | Status: AC | PRN

## 2021-05-10 NOTE — Progress Notes (Signed)
Virtual Visit via Telephone Note  I connected with Tami Hopkins on 05/10/21 at  9:00 AM EDT by telephone and verified that I am speaking with the correct person using two identifiers.  Location: Patient: Home Provider: Pain Center   I discussed the limitations, risks, security and privacy concerns of performing an evaluation and management service by telephone and the availability of in person appointments. I also discussed with the patient that there may be a patient responsible charge related to this service. The patient expressed understanding and agreed to proceed.   History of Present Illness: I spoke with Tami Hopkins today via telephone as she was unable to do the video portion of the virtual conference and she reports that her back pain is doing reasonably well with her current regimen.  She is doing some pelvic strengthening exercises and these have recently aggravated her back and we spoke regarding modifications of this.  Otherwise she is staying active and doing physical therapy and it is keeping her back pain under reasonable control.  Combination of Percocet 2-3 times a day with baclofen and gabapentin enables her to stay active with limited need for additional pain management service.  Her lower extremity strength and function has been at baseline.  The quality characteristic and distribution of her back pain has been stable as well.  Bowel bladder function has been good.  Review of systems: General: No fevers or chills Pulmonary: No shortness of breath or dyspnea Cardiac: No angina or palpitations or lightheadedness GI: No abdominal pain or constipation Psych: No depression    Observations/Objective:  Current Outpatient Medications:    oxyCODONE-acetaminophen (PERCOCET) 5-325 MG tablet, Take 1 tablet by mouth every 8 (eight) hours as needed for moderate pain or severe pain., Disp: 75 tablet, Rfl: 0   [START ON 06/09/2021] oxyCODONE-acetaminophen (PERCOCET) 5-325 MG  tablet, Take 1 tablet by mouth every 8 (eight) hours as needed for moderate pain or severe pain., Disp: 75 tablet, Rfl: 0   baclofen (LIORESAL) 10 MG tablet, Take 1 tablet (10 mg total) by mouth 2 (two) times daily., Disp: 60 tablet, Rfl: 3   clonazePAM (KLONOPIN) 1 MG tablet, Take 1 mg by mouth at bedtime as needed., Disp: , Rfl:    diphenoxylate-atropine (LOMOTIL) 2.5-0.025 MG per tablet, Take 1 tablet by mouth 4 (four) times daily as needed for diarrhea or loose stools., Disp: , Rfl:    FLOVENT HFA 110 MCG/ACT inhaler, Inhale 2 puffs into the lungs at bedtime. , Disp: , Rfl:    fluticasone (FLONASE) 50 MCG/ACT nasal spray, Place 1 spray into both nostrils 2 (two) times daily., Disp: , Rfl:    furosemide (LASIX) 40 MG tablet, Take 1 tablet by mouth as needed. , Disp: , Rfl:    gabapentin (NEURONTIN) 300 MG capsule, Take 1 capsule (300 mg total) by mouth 4 (four) times daily., Disp: 120 capsule, Rfl: 3   ipratropium (ATROVENT) 0.06 % nasal spray, Place into the nose., Disp: , Rfl:    KLOR-CON 10 10 MEQ tablet, Take 10 mEq by mouth as needed. , Disp: , Rfl: 5   montelukast (SINGULAIR) 10 MG tablet, Take 10 mg by mouth at bedtime., Disp: , Rfl:    pantoprazole (PROTONIX) 40 MG tablet, Take by mouth., Disp: , Rfl:    pregabalin (LYRICA) 150 MG capsule, Take 1 capsule (150 mg total) by mouth 2 (two) times daily., Disp: 60 capsule, Rfl: 3   PROAIR HFA 108 (90 BASE) MCG/ACT inhaler, Inhale 2 puffs into the  lungs every 4 (four) hours as needed., Disp: , Rfl:    promethazine (PHENERGAN) 25 MG tablet, Take 50 mg by mouth as needed. , Disp: , Rfl:    risperiDONE (RISPERDAL) 1 MG tablet, Take 1 tablet (1 mg total) by mouth at bedtime. (Patient not taking: Reported on 04/07/2020), Disp: 30 tablet, Rfl: 2   topiramate (TOPAMAX) 25 MG tablet, Take 1 tablet (25 mg total) by mouth 2 (two) times daily., Disp: 60 tablet, Rfl: 0   zolpidem (AMBIEN) 10 MG tablet, Take 10 mg by mouth at bedtime as needed for sleep.,  Disp: , Rfl:    Assessment and Plan:  1. Chronic pain syndrome   2. Chronic, continuous use of opioids   3. Cervicalgia   4. Bilateral sciatica   5. DDD (degenerative disc disease), lumbar   6. Musculoskeletal back pain   7. Congenital scoliosis   8. Chronic right-sided low back pain with right-sided sciatica   9. Back pain at L4-L5 level   Based on our discussion today and upon review of the [facility] practitioner database information I do not believe there is any need to change her current regimen.  Refills will be given for the Percocet July 20 and August 19.  We talked about several different exercises to help with quadratus lumborum stretch and pelvic strengthening in addition to generalized physical therapy regimen.  I encouraged her to continue with walking and we have also talked about dietary control for both she and her ex-husband who may be going for a three-vessel bypass soon.  No other changes will be initiated today.  Continue follow-up with her primary care physicians for baseline medical care. Follow Up Instructions:    I discussed the assessment and treatment plan with the patient. The patient was provided an opportunity to ask questions and all were answered. The patient agreed with the plan and demonstrated an understanding of the instructions.   The patient was advised to call back or seek an in-person evaluation if the symptoms worsen or if the condition fails to improve as anticipated.  I provided 30 minutes of non-face-to-face time during this encounter.   Molli Barrows, MD

## 2021-05-19 LAB — TOXASSURE SELECT 13 (MW), URINE

## 2021-07-05 ENCOUNTER — Ambulatory Visit: Payer: Medicare HMO | Attending: Anesthesiology | Admitting: Anesthesiology

## 2021-07-05 ENCOUNTER — Other Ambulatory Visit: Payer: Self-pay

## 2021-07-05 ENCOUNTER — Encounter: Payer: Self-pay | Admitting: Anesthesiology

## 2021-07-05 DIAGNOSIS — M542 Cervicalgia: Secondary | ICD-10-CM

## 2021-07-05 DIAGNOSIS — M5136 Other intervertebral disc degeneration, lumbar region: Secondary | ICD-10-CM

## 2021-07-05 DIAGNOSIS — M5431 Sciatica, right side: Secondary | ICD-10-CM | POA: Diagnosis not present

## 2021-07-05 DIAGNOSIS — M549 Dorsalgia, unspecified: Secondary | ICD-10-CM

## 2021-07-05 DIAGNOSIS — G8929 Other chronic pain: Secondary | ICD-10-CM

## 2021-07-05 DIAGNOSIS — Q675 Congenital deformity of spine: Secondary | ICD-10-CM

## 2021-07-05 DIAGNOSIS — G894 Chronic pain syndrome: Secondary | ICD-10-CM

## 2021-07-05 DIAGNOSIS — F1190 Opioid use, unspecified, uncomplicated: Secondary | ICD-10-CM | POA: Diagnosis not present

## 2021-07-05 DIAGNOSIS — M5432 Sciatica, left side: Secondary | ICD-10-CM

## 2021-07-05 DIAGNOSIS — M5441 Lumbago with sciatica, right side: Secondary | ICD-10-CM

## 2021-07-05 MED ORDER — OXYCODONE-ACETAMINOPHEN 5-325 MG PO TABS: 1.0000 | ORAL_TABLET | Freq: Three times a day (TID) | ORAL | 0 refills | Status: DC | PRN

## 2021-07-05 MED ORDER — OXYCODONE-ACETAMINOPHEN 5-325 MG PO TABS
1.0000 | ORAL_TABLET | Freq: Three times a day (TID) | ORAL | 0 refills | Status: AC | PRN
Start: 2021-07-08 — End: 2021-08-07

## 2021-07-05 NOTE — Progress Notes (Signed)
Virtual Visit via Telephone Note  I connected with Tami Hopkins on 07/05/21 at  1:00 PM EDT by telephone and verified that I am speaking with the correct person using two identifiers.  Location: Patient: Home Provider: Pain control center   I discussed the limitations, risks, security and privacy concerns of performing an evaluation and management service by telephone and the availability of in person appointments. I also discussed with the patient that there may be a patient responsible charge related to this service. The patient expressed understanding and agreed to proceed.   History of Present Illness: I spoke with Tami Hopkins via telephone today.  We are unable to link for the video portion of this conference but she reports that she is doing reasonably well with her low back pain and leg pain the quality characteristic and distribution of been stable in nature.  She still taking her baclofen as needed for muscle spasms and her opioids for pain control.  This combination is working well for her.  She uses her gabapentin additionally for pain management and this helps with the radicular component of her pain.  She is staying active and doing her exercises as best tolerated.  She reports that she has a puppy and this is helping to keep her active and she is staying mobile.  No problems with lower extremity strength or function or bowel or bladder function are noted.  She has no side effects with the opioid medication management and this has been a chronic medication for her and has worked well.  She denies any diverting or illicit use and reports doing well.  Review of systems: General: No fevers or chills Pulmonary: No shortness of breath or dyspnea Cardiac: No angina or palpitations or lightheadedness GI: No abdominal pain or constipation Psych: No depression    Observations/Objective:   Current Outpatient Medications:    [START ON 08/08/2021] oxyCODONE-acetaminophen (PERCOCET) 5-325 MG  tablet, Take 1 tablet by mouth every 8 (eight) hours as needed for moderate pain or severe pain., Disp: 75 tablet, Rfl: 0   baclofen (LIORESAL) 10 MG tablet, Take 1 tablet (10 mg total) by mouth 2 (two) times daily., Disp: 60 tablet, Rfl: 3   clonazePAM (KLONOPIN) 1 MG tablet, Take 1 mg by mouth at bedtime as needed., Disp: , Rfl:    diphenoxylate-atropine (LOMOTIL) 2.5-0.025 MG per tablet, Take 1 tablet by mouth 4 (four) times daily as needed for diarrhea or loose stools., Disp: , Rfl:    FLOVENT HFA 110 MCG/ACT inhaler, Inhale 2 puffs into the lungs at bedtime. , Disp: , Rfl:    fluticasone (FLONASE) 50 MCG/ACT nasal spray, Place 1 spray into both nostrils 2 (two) times daily., Disp: , Rfl:    furosemide (LASIX) 40 MG tablet, Take 1 tablet by mouth as needed. , Disp: , Rfl:    gabapentin (NEURONTIN) 300 MG capsule, Take 1 capsule (300 mg total) by mouth 4 (four) times daily., Disp: 120 capsule, Rfl: 3   ipratropium (ATROVENT) 0.06 % nasal spray, Place into the nose., Disp: , Rfl:    KLOR-CON 10 10 MEQ tablet, Take 10 mEq by mouth as needed. , Disp: , Rfl: 5   montelukast (SINGULAIR) 10 MG tablet, Take 10 mg by mouth at bedtime., Disp: , Rfl:    [START ON 07/08/2021] oxyCODONE-acetaminophen (PERCOCET) 5-325 MG tablet, Take 1 tablet by mouth every 8 (eight) hours as needed for moderate pain or severe pain., Disp: 75 tablet, Rfl: 0   pantoprazole (PROTONIX) 40 MG tablet,  Take by mouth., Disp: , Rfl:    PROAIR HFA 108 (90 BASE) MCG/ACT inhaler, Inhale 2 puffs into the lungs every 4 (four) hours as needed., Disp: , Rfl:    promethazine (PHENERGAN) 25 MG tablet, Take 50 mg by mouth as needed. , Disp: , Rfl:    risperiDONE (RISPERDAL) 1 MG tablet, Take 1 tablet (1 mg total) by mouth at bedtime. (Patient not taking: Reported on 04/07/2020), Disp: 30 tablet, Rfl: 2   topiramate (TOPAMAX) 25 MG tablet, Take 1 tablet (25 mg total) by mouth 2 (two) times daily., Disp: 60 tablet, Rfl: 0   zolpidem (AMBIEN) 10 MG  tablet, Take 10 mg by mouth at bedtime as needed for sleep., Disp: , Rfl:   Assessment and Plan: 1. Chronic pain syndrome   2. Chronic, continuous use of opioids   3. Cervicalgia   4. Bilateral sciatica   5. DDD (degenerative disc disease), lumbar   6. Musculoskeletal back pain   7. Congenital scoliosis   8. Chronic right-sided low back pain with right-sided sciatica   Based on our discussion today I think is appropriate to keep her on her current medication regimen.  No other changes will be initiated.  Her opioids will be prescribed for September 18 and October 18 with return to clinic scheduled in 2 months.  I want her to continue follow-up with her primary care physicians for baseline medical care.  Follow Up Instructions:    I discussed the assessment and treatment plan with the patient. The patient was provided an opportunity to ask questions and all were answered. The patient agreed with the plan and demonstrated an understanding of the instructions.   The patient was advised to call back or seek an in-person evaluation if the symptoms worsen or if the condition fails to improve as anticipated.  I provided 30 minutes of non-face-to-face time during this encounter.   Molli Barrows, MD

## 2021-08-09 ENCOUNTER — Ambulatory Visit: Payer: Medicare HMO | Attending: Anesthesiology | Admitting: Anesthesiology

## 2021-08-09 ENCOUNTER — Encounter: Payer: Self-pay | Admitting: Anesthesiology

## 2021-08-09 ENCOUNTER — Other Ambulatory Visit: Payer: Self-pay

## 2021-08-09 DIAGNOSIS — F1190 Opioid use, unspecified, uncomplicated: Secondary | ICD-10-CM | POA: Diagnosis not present

## 2021-08-09 DIAGNOSIS — Q675 Congenital deformity of spine: Secondary | ICD-10-CM

## 2021-08-09 DIAGNOSIS — M5431 Sciatica, right side: Secondary | ICD-10-CM

## 2021-08-09 DIAGNOSIS — M542 Cervicalgia: Secondary | ICD-10-CM | POA: Diagnosis not present

## 2021-08-09 DIAGNOSIS — G894 Chronic pain syndrome: Secondary | ICD-10-CM

## 2021-08-09 DIAGNOSIS — M549 Dorsalgia, unspecified: Secondary | ICD-10-CM

## 2021-08-09 DIAGNOSIS — M5136 Other intervertebral disc degeneration, lumbar region: Secondary | ICD-10-CM

## 2021-08-09 DIAGNOSIS — M5432 Sciatica, left side: Secondary | ICD-10-CM

## 2021-08-09 MED ORDER — OXYCODONE-ACETAMINOPHEN 5-325 MG PO TABS: 1.0000 | ORAL_TABLET | Freq: Three times a day (TID) | ORAL | 0 refills | Status: AC | PRN

## 2021-08-09 NOTE — Progress Notes (Signed)
Virtual Visit via Telephone Note  I connected with Tami Hopkins on 08/09/21 at  1:25 PM EDT by telephone and verified that I am speaking with the correct person using two identifiers.  Location: Patient: Home Provider: Pain control center   I discussed the limitations, risks, security and privacy concerns of performing an evaluation and management service by telephone and the availability of in person appointments. I also discussed with the patient that there may be a patient responsible charge related to this service. The patient expressed understanding and agreed to proceed.   History of Present Illness: I spoke with Tami Hopkins via telephone today as we were unable link for the video portion of this conference.  She reports that her low back pain and leg pain are stable.  No change in the quality characteristic or distribution are noted.  She still taking her medications as prescribed and these continue to work well for her without side effect.  She was very tearful during our conversation.  She reports that she has had problems with her roommate feeding her dog food that has made the dog sick.  Furthermore she has had issues with housing restriction and limitation in the past and it appears that she has had a falling out with the roommate and now may be without subsequent housing.  In regards to her low back pain this is been stable and she is trying to stay active.  No change in the quality characteristic or distribution are noted.  She is taking her medications as prescribed and these do give her good relief without side effect.  She denies any diverting or illicit use and otherwise states that she is in her usual state of health.  Her biggest problem is her housing issues and roommate  Review of systems: General: No fevers or chills Pulmonary: No shortness of breath or dyspnea Cardiac: No angina or palpitations or lightheadedness GI: No abdominal pain or constipation Psych: No  depression    Observations/Objective:  Current Outpatient Medications:    baclofen (LIORESAL) 10 MG tablet, Take 1 tablet (10 mg total) by mouth 2 (two) times daily., Disp: 60 tablet, Rfl: 3   clonazePAM (KLONOPIN) 1 MG tablet, Take 1 mg by mouth at bedtime as needed., Disp: , Rfl:    diphenoxylate-atropine (LOMOTIL) 2.5-0.025 MG per tablet, Take 1 tablet by mouth 4 (four) times daily as needed for diarrhea or loose stools., Disp: , Rfl:    FLOVENT HFA 110 MCG/ACT inhaler, Inhale 2 puffs into the lungs at bedtime. , Disp: , Rfl:    fluticasone (FLONASE) 50 MCG/ACT nasal spray, Place 1 spray into both nostrils 2 (two) times daily., Disp: , Rfl:    furosemide (LASIX) 40 MG tablet, Take 1 tablet by mouth as needed. , Disp: , Rfl:    gabapentin (NEURONTIN) 300 MG capsule, Take 1 capsule (300 mg total) by mouth 4 (four) times daily., Disp: 120 capsule, Rfl: 3   ipratropium (ATROVENT) 0.06 % nasal spray, Place into the nose., Disp: , Rfl:    KLOR-CON 10 10 MEQ tablet, Take 10 mEq by mouth as needed. , Disp: , Rfl: 5   montelukast (SINGULAIR) 10 MG tablet, Take 10 mg by mouth at bedtime., Disp: , Rfl:    [START ON 09/07/2021] oxyCODONE-acetaminophen (PERCOCET) 5-325 MG tablet, Take 1 tablet by mouth every 8 (eight) hours as needed for moderate pain or severe pain., Disp: 75 tablet, Rfl: 0   pantoprazole (PROTONIX) 40 MG tablet, Take by mouth., Disp: ,  Rfl:    PROAIR HFA 108 (90 BASE) MCG/ACT inhaler, Inhale 2 puffs into the lungs every 4 (four) hours as needed., Disp: , Rfl:    promethazine (PHENERGAN) 25 MG tablet, Take 50 mg by mouth as needed. , Disp: , Rfl:    risperiDONE (RISPERDAL) 1 MG tablet, Take 1 tablet (1 mg total) by mouth at bedtime. (Patient not taking: Reported on 04/07/2020), Disp: 30 tablet, Rfl: 2   topiramate (TOPAMAX) 25 MG tablet, Take 1 tablet (25 mg total) by mouth 2 (two) times daily., Disp: 60 tablet, Rfl: 0   zolpidem (AMBIEN) 10 MG tablet, Take 10 mg by mouth at bedtime as  needed for sleep., Disp: , Rfl:    Assessment and Plan: 1. Chronic pain syndrome   2. Chronic, continuous use of opioids   3. Cervicalgia   4. Bilateral sciatica   5. DDD (degenerative disc disease), lumbar   6. Musculoskeletal back pain   7. Congenital scoliosis   I had a long discussion with Tami Hopkins today regarding her current situation.  It appears that her medications are working well for her and I have reviewed the [facility] information and it is appropriate for refill.  This be dated for October 18 and November 17.  Her previous UDS was also reviewed and is appropriate as well.  She is taking baclofen for muscle spasms and the opioids give her 50 to 70% relief when she takes them.  She denies any diverting or illicit use.  She seems to be having some significant issues with her housing status and I encouraged her to continue follow-up efforts with social services to see if she can get some direction of potential relief in this regard.  Were hopeful that she will have some stability from this perspective.  I have encouraged her to follow-up with her primary care physicians for additional follow-up as indicated.  Follow Up Instructions:    I discussed the assessment and treatment plan with the patient. The patient was provided an opportunity to ask questions and all were answered. The patient agreed with the plan and demonstrated an understanding of the instructions.   The patient was advised to call back or seek an in-person evaluation if the symptoms worsen or if the condition fails to improve as anticipated.  I provided 30 minutes of non-face-to-face time during this encounter.   Molli Barrows, MD

## 2021-08-21 ENCOUNTER — Ambulatory Visit: Payer: Medicare HMO | Attending: Anesthesiology | Admitting: Anesthesiology

## 2021-08-21 ENCOUNTER — Encounter: Payer: Self-pay | Admitting: Anesthesiology

## 2021-08-21 ENCOUNTER — Other Ambulatory Visit: Payer: Self-pay

## 2021-08-21 DIAGNOSIS — F1190 Opioid use, unspecified, uncomplicated: Secondary | ICD-10-CM

## 2021-08-21 DIAGNOSIS — G894 Chronic pain syndrome: Secondary | ICD-10-CM | POA: Diagnosis not present

## 2021-08-21 DIAGNOSIS — M545 Low back pain, unspecified: Secondary | ICD-10-CM

## 2021-08-21 DIAGNOSIS — M5431 Sciatica, right side: Secondary | ICD-10-CM | POA: Diagnosis not present

## 2021-08-21 DIAGNOSIS — G8929 Other chronic pain: Secondary | ICD-10-CM

## 2021-08-21 DIAGNOSIS — M5136 Other intervertebral disc degeneration, lumbar region: Secondary | ICD-10-CM

## 2021-08-21 DIAGNOSIS — M5441 Lumbago with sciatica, right side: Secondary | ICD-10-CM

## 2021-08-21 DIAGNOSIS — M5432 Sciatica, left side: Secondary | ICD-10-CM

## 2021-08-21 DIAGNOSIS — M549 Dorsalgia, unspecified: Secondary | ICD-10-CM

## 2021-08-21 DIAGNOSIS — M542 Cervicalgia: Secondary | ICD-10-CM

## 2021-08-21 DIAGNOSIS — Q675 Congenital deformity of spine: Secondary | ICD-10-CM

## 2021-08-21 MED ORDER — GABAPENTIN 300 MG PO CAPS: 300.0000 mg | ORAL_CAPSULE | Freq: Four times a day (QID) | ORAL | 3 refills | Status: DC

## 2021-08-21 NOTE — Progress Notes (Signed)
Virtual Visit via Telephone Note  I connected with Tami Hopkins on 08/21/21 at  4:40 PM EDT by telephone and verified that I am speaking with the correct person using two identifiers.  Location: Patient: Home Provider: Pain control center   I discussed the limitations, risks, security and privacy concerns of performing an evaluation and management service by telephone and the availability of in person appointments. I also discussed with the patient that there may be a patient responsible charge related to this service. The patient expressed understanding and agreed to proceed.   History of Present Illness: I spoke with Tami Hopkins via telephone for her pain conference.  She was unable to do the video portion of the conference but reports that she is doing reasonably well with her low back pain and hip pain knee pain etc.  No change in the quality characteristic or distribution of this primary.  She is taking her medications as prescribed and these continue to work well for her.  She generally gets about 50 to 75% relief with the medications and no side effects reported.  Otherwise she is in her usual state of health.  She continues to have difficulty with her home living circumstances and has had some problems with her roommate.  This is been a complexity of ongoing nature for her.  She is doing her exercises and trying to stay active in an effort to keep her back pain under control.  No other changes reported no changes in lower extremity strength or function of bowel or bladder function are noted at this time.  Review of systems: General: No fevers or chills Pulmonary: No shortness of breath or dyspnea Cardiac: No angina or palpitations or lightheadedness GI: No abdominal pain or constipation Psych: No depression    Observations/Objective:  Current Outpatient Medications:    baclofen (LIORESAL) 10 MG tablet, Take 1 tablet (10 mg total) by mouth 2 (two) times daily., Disp: 60 tablet, Rfl: 3    clonazePAM (KLONOPIN) 1 MG tablet, Take 1 mg by mouth at bedtime as needed., Disp: , Rfl:    diphenoxylate-atropine (LOMOTIL) 2.5-0.025 MG per tablet, Take 1 tablet by mouth 4 (four) times daily as needed for diarrhea or loose stools., Disp: , Rfl:    FLOVENT HFA 110 MCG/ACT inhaler, Inhale 2 puffs into the lungs at bedtime. , Disp: , Rfl:    fluticasone (FLONASE) 50 MCG/ACT nasal spray, Place 1 spray into both nostrils 2 (two) times daily., Disp: , Rfl:    furosemide (LASIX) 40 MG tablet, Take 1 tablet by mouth as needed. , Disp: , Rfl:    gabapentin (NEURONTIN) 300 MG capsule, Take 1 capsule (300 mg total) by mouth 4 (four) times daily., Disp: 120 capsule, Rfl: 3   ipratropium (ATROVENT) 0.06 % nasal spray, Place into the nose., Disp: , Rfl:    KLOR-CON 10 10 MEQ tablet, Take 10 mEq by mouth as needed. , Disp: , Rfl: 5   montelukast (SINGULAIR) 10 MG tablet, Take 10 mg by mouth at bedtime., Disp: , Rfl:    [START ON 09/07/2021] oxyCODONE-acetaminophen (PERCOCET) 5-325 MG tablet, Take 1 tablet by mouth every 8 (eight) hours as needed for moderate pain or severe pain., Disp: 75 tablet, Rfl: 0   pantoprazole (PROTONIX) 40 MG tablet, Take by mouth., Disp: , Rfl:    PROAIR HFA 108 (90 BASE) MCG/ACT inhaler, Inhale 2 puffs into the lungs every 4 (four) hours as needed., Disp: , Rfl:    promethazine (PHENERGAN) 25 MG tablet,  Take 50 mg by mouth as needed. , Disp: , Rfl:    risperiDONE (RISPERDAL) 1 MG tablet, Take 1 tablet (1 mg total) by mouth at bedtime. (Patient not taking: Reported on 04/07/2020), Disp: 30 tablet, Rfl: 2   topiramate (TOPAMAX) 25 MG tablet, Take 1 tablet (25 mg total) by mouth 2 (two) times daily., Disp: 60 tablet, Rfl: 0   zolpidem (AMBIEN) 10 MG tablet, Take 10 mg by mouth at bedtime as needed for sleep., Disp: , Rfl:    Assessment and Plan: 1. Chronic pain syndrome   2. Chronic, continuous use of opioids   3. Cervicalgia   4. Bilateral sciatica   5. DDD (degenerative disc  disease), lumbar   6. Musculoskeletal back pain   7. Congenital scoliosis   8. Chronic right-sided low back pain with right-sided sciatica   9. Back pain at L4-L5 level   Based on our discussion today and upon review of the [facility] practitioner database information I think is appropriate to continue with her current regimen.  No changes will be initiated in her pain management protocol.  I have encouraged her to continue efforts at follow-up with psychosocial assistance with some recommendations given today.  She has been compliant with her regimen and I think it is appropriate to continue that.  Medications will be refilled for the next 2 months with return to clinic requested in 2 months.  She is to continue follow-up with her primary care physicians for baseline medical care  Follow Up Instructions:    I discussed the assessment and treatment plan with the patient. The patient was provided an opportunity to ask questions and all were answered. The patient agreed with the plan and demonstrated an understanding of the instructions.   The patient was advised to call back or seek an in-person evaluation if the symptoms worsen or if the condition fails to improve as anticipated.  I provided 25 minutes of non-face-to-face time during this encounter.   Molli Barrows, MD

## 2021-09-07 ENCOUNTER — Other Ambulatory Visit: Payer: Self-pay | Admitting: Family Medicine

## 2021-09-07 DIAGNOSIS — Z1231 Encounter for screening mammogram for malignant neoplasm of breast: Secondary | ICD-10-CM

## 2021-10-09 ENCOUNTER — Telehealth: Payer: Self-pay | Admitting: Anesthesiology

## 2021-10-09 NOTE — Telephone Encounter (Signed)
Only received 1 script for percocet last visit 08-21-21. Please see if Dr. [name] will send script to fill today. I have patient on for VV tomorrow with Dr. [name]. Patient has been out since Friday. Please call patient and let her know status. Thank you

## 2021-10-10 ENCOUNTER — Encounter: Payer: Self-pay | Admitting: Anesthesiology

## 2021-10-10 ENCOUNTER — Other Ambulatory Visit: Payer: Self-pay

## 2021-10-10 ENCOUNTER — Ambulatory Visit
Admission: RE | Admit: 2021-10-10 | Discharge: 2021-10-10 | Disposition: A | Discharge status: Home / Self Care | Payer: Medicare HMO | Source: Ambulatory Visit | Attending: Family Medicine | Admitting: Family Medicine

## 2021-10-10 ENCOUNTER — Ambulatory Visit (HOSPITAL_BASED_OUTPATIENT_CLINIC_OR_DEPARTMENT_OTHER): Payer: Medicare HMO | Admitting: Anesthesiology

## 2021-10-10 DIAGNOSIS — M5441 Lumbago with sciatica, right side: Secondary | ICD-10-CM | POA: Diagnosis present

## 2021-10-10 DIAGNOSIS — Q675 Congenital deformity of spine: Secondary | ICD-10-CM | POA: Diagnosis present

## 2021-10-10 DIAGNOSIS — M5136 Other intervertebral disc degeneration, lumbar region: Secondary | ICD-10-CM | POA: Insufficient documentation

## 2021-10-10 DIAGNOSIS — M25551 Pain in right hip: Secondary | ICD-10-CM | POA: Diagnosis present

## 2021-10-10 DIAGNOSIS — M5431 Sciatica, right side: Secondary | ICD-10-CM | POA: Insufficient documentation

## 2021-10-10 DIAGNOSIS — F1190 Opioid use, unspecified, uncomplicated: Secondary | ICD-10-CM | POA: Insufficient documentation

## 2021-10-10 DIAGNOSIS — M542 Cervicalgia: Secondary | ICD-10-CM

## 2021-10-10 DIAGNOSIS — M549 Dorsalgia, unspecified: Secondary | ICD-10-CM | POA: Diagnosis present

## 2021-10-10 DIAGNOSIS — Z1231 Encounter for screening mammogram for malignant neoplasm of breast: Secondary | ICD-10-CM

## 2021-10-10 DIAGNOSIS — M5432 Sciatica, left side: Secondary | ICD-10-CM | POA: Diagnosis present

## 2021-10-10 DIAGNOSIS — M545 Low back pain, unspecified: Secondary | ICD-10-CM | POA: Diagnosis present

## 2021-10-10 DIAGNOSIS — G894 Chronic pain syndrome: Secondary | ICD-10-CM | POA: Diagnosis present

## 2021-10-10 DIAGNOSIS — G8929 Other chronic pain: Secondary | ICD-10-CM

## 2021-10-10 MED ORDER — GABAPENTIN 300 MG PO CAPS: 300.0000 mg | ORAL_CAPSULE | Freq: Four times a day (QID) | ORAL | 3 refills | Status: DC

## 2021-10-10 MED ORDER — OXYCODONE-ACETAMINOPHEN 5-325 MG PO TABS: 1.0000 | ORAL_TABLET | Freq: Three times a day (TID) | ORAL | 0 refills | Status: DC | PRN

## 2021-10-10 MED ORDER — BACLOFEN 10 MG PO TABS: 10.0000 mg | ORAL_TABLET | Freq: Two times a day (BID) | ORAL | 3 refills | Status: DC

## 2021-10-10 MED ORDER — OXYCODONE-ACETAMINOPHEN 5-325 MG PO TABS: 1.0000 | ORAL_TABLET | Freq: Three times a day (TID) | ORAL | 0 refills | Status: AC | PRN

## 2021-10-10 NOTE — Progress Notes (Signed)
Virtual Visit via Telephone Note  I connected with Tami Hopkins on 10/10/21 at  2:20 PM EST by telephone and verified that I am speaking with the correct person using two identifiers.  Location: Patient: Home Provider: [facility] control center   I discussed the limitations, risks, security and privacy concerns of performing an evaluation and management service by telephone and the availability of in person appointments. I also discussed with the patient that there may be a patient responsible charge related to this service. The patient expressed understanding and agreed to proceed.   History of Present Illness: I spoke with Tami Hopkins via telephone as she was unable to perform the video portion of virtual conferencing reports that she is still having a lot of back pain but this is generally well managed with her current medication regimen.  She takes her opioid medications approximately 2 or 3 times a day spaced out appropriately and is getting good relief from these.  She has failed more conservative therapy and continues efforts at stretching strengthening as prescribed.  She is try to stay active and this does help her.  The quality characteristic and distribution of her low back pain and leg pain are unchanged.  No new troubles with bowel or bladder dysfunction are noted and she continues to get good relief with her medications and derives good functional benefit from them she reports.  She has failed more conservative therapy she reports.  She denies any diverting or illicit use as well.  Review of systems: General: No fevers or chills Pulmonary: No shortness of breath or dyspnea Cardiac: No angina or palpitations or lightheadedness GI: No abdominal pain or constipation Psych: No depression    Observations/Objective:  Current Outpatient Medications:    oxyCODONE-acetaminophen (PERCOCET) 5-325 MG tablet, Take 1 tablet by mouth every 8 (eight) hours as needed for moderate pain or severe  pain., Disp: 75 tablet, Rfl: 0   [START ON 11/09/2021] oxyCODONE-acetaminophen (PERCOCET) 5-325 MG tablet, Take 1 tablet by mouth every 8 (eight) hours as needed for moderate pain or severe pain., Disp: 75 tablet, Rfl: 0   baclofen (LIORESAL) 10 MG tablet, Take 1 tablet (10 mg total) by mouth 2 (two) times daily., Disp: 60 tablet, Rfl: 3   clonazePAM (KLONOPIN) 1 MG tablet, Take 1 mg by mouth at bedtime as needed., Disp: , Rfl:    diphenoxylate-atropine (LOMOTIL) 2.5-0.025 MG per tablet, Take 1 tablet by mouth 4 (four) times daily as needed for diarrhea or loose stools., Disp: , Rfl:    FLOVENT HFA 110 MCG/ACT inhaler, Inhale 2 puffs into the lungs at bedtime. , Disp: , Rfl:    fluticasone (FLONASE) 50 MCG/ACT nasal spray, Place 1 spray into both nostrils 2 (two) times daily., Disp: , Rfl:    furosemide (LASIX) 40 MG tablet, Take 1 tablet by mouth as needed. , Disp: , Rfl:    gabapentin (NEURONTIN) 300 MG capsule, Take 1 capsule (300 mg total) by mouth 4 (four) times daily., Disp: 120 capsule, Rfl: 3   ipratropium (ATROVENT) 0.06 % nasal spray, Place into the nose., Disp: , Rfl:    KLOR-CON 10 10 MEQ tablet, Take 10 mEq by mouth as needed. , Disp: , Rfl: 5   montelukast (SINGULAIR) 10 MG tablet, Take 10 mg by mouth at bedtime., Disp: , Rfl:    pantoprazole (PROTONIX) 40 MG tablet, Take by mouth., Disp: , Rfl:    PROAIR HFA 108 (90 BASE) MCG/ACT inhaler, Inhale 2 puffs into the lungs every 4 (  four) hours as needed., Disp: , Rfl:    promethazine (PHENERGAN) 25 MG tablet, Take 50 mg by mouth as needed. , Disp: , Rfl:    risperiDONE (RISPERDAL) 1 MG tablet, Take 1 tablet (1 mg total) by mouth at bedtime. (Patient not taking: Reported on 04/07/2020), Disp: 30 tablet, Rfl: 2   topiramate (TOPAMAX) 25 MG tablet, Take 1 tablet (25 mg total) by mouth 2 (two) times daily., Disp: 60 tablet, Rfl: 0   zolpidem (AMBIEN) 10 MG tablet, Take 10 mg by mouth at bedtime as needed for sleep., Disp: , Rfl:    Assessment  and Plan: 1. Chronic pain syndrome   2. Chronic, continuous use of opioids   3. Cervicalgia   4. Bilateral sciatica   5. DDD (degenerative disc disease), lumbar   6. Musculoskeletal back pain   7. Congenital scoliosis   8. Chronic right-sided low back pain with right-sided sciatica   9. Chronic hip pain, right   10. Back pain at L4-L5 level   Based on her discussion today it appears appropriate to refill her medications for the next 2 months dated for December 20 and January 19.  Other changes in her pharmacologic regimen will be initiated.  I encouraged her to continue with stretching strengthening exercises as reviewed.  She is to continue with her baclofen and gabapentin which continue to give her good relief with her sciatica-like symptoms and spasming of the back.  Continue with stretching strengthening exercises and continue care with her primary care physicians for baseline medical care with schedule return to clinic in 2 months.  Follow Up Instructions:    I discussed the assessment and treatment plan with the patient. The patient was provided an opportunity to ask questions and all were answered. The patient agreed with the plan and demonstrated an understanding of the instructions.   The patient was advised to call back or seek an in-person evaluation if the symptoms worsen or if the condition fails to improve as anticipated.  I provided 30 minutes of non-face-to-face time during this encounter.   Molli Barrows, MD

## 2021-10-10 NOTE — Telephone Encounter (Signed)
Dr. [name] has a VV with her today and can refill at that time if appropriate.

## 2021-11-08 ENCOUNTER — Other Ambulatory Visit: Payer: Self-pay | Admitting: *Deleted

## 2021-11-08 ENCOUNTER — Telehealth: Payer: Self-pay | Admitting: Anesthesiology

## 2021-11-08 NOTE — Telephone Encounter (Signed)
I have communicated with JA and he has approved an early fill x 1 day.  I have counseled the patient that she must take her medication as directed and this will still have to last x 30 days.  Also, I have encouraged the patient to f/up with [facility] TODAY, as it could take some time to get her in and if she doesn't have the fracture treated she will have many more problems in the future with pain.  She verbalizes u/o information, however she states she has problems with transportation.  States she will have someone else pickup her Rx today if approved for early fill [facility].    Again, encouraged patient to call [facility] to get in for evaluation and possible pain relief management for acute situation.   [facility]..she[facility]her[facility]hers Pharmacy will fill 1 day early for patient.

## 2021-11-08 NOTE — Telephone Encounter (Signed)
Patient had a fall in the bathroom and broke her shoulder and upper arm left side.  She was treated at urgent care by Beatriz Stallion, FNP for the fracture.  She was told that they could not prescribe any additional pain medication since she is under a pain contract.  She is asking if JA will prescribe her additional qty so that she can take 3 times a day routinely instead of qty 75.    They have referred her to [facility] for evaluation.  She is currently in an arm sling.

## 2021-11-22 ENCOUNTER — Telehealth: Payer: Self-pay | Admitting: Anesthesiology

## 2021-11-22 ENCOUNTER — Telehealth: Payer: Self-pay

## 2021-11-22 NOTE — Telephone Encounter (Signed)
LM for patient to call office to discuss phone call message.

## 2021-11-22 NOTE — Telephone Encounter (Signed)
Surgeon letter faxed to [facility].  Patient explained.  States understanding and she will call [facility].

## 2021-11-23 ENCOUNTER — Telehealth: Payer: Self-pay | Admitting: Anesthesiology

## 2021-11-23 NOTE — Telephone Encounter (Signed)
Surgeon's Letter for post op pain management faxed to [facility]

## 2021-11-23 NOTE — Telephone Encounter (Signed)
Needs to speak with someone about getting a letter to [facility] saying it is ok for them to write script for pain meds with broken shoulder while she is taking meds from Dr. [name] as well. Please call her

## 2021-12-04 ENCOUNTER — Other Ambulatory Visit: Payer: Self-pay

## 2021-12-04 ENCOUNTER — Ambulatory Visit: Payer: Medicare HMO | Attending: Anesthesiology | Admitting: Anesthesiology

## 2021-12-04 ENCOUNTER — Encounter: Payer: Self-pay | Admitting: Anesthesiology

## 2021-12-04 DIAGNOSIS — F1190 Opioid use, unspecified, uncomplicated: Secondary | ICD-10-CM

## 2021-12-04 DIAGNOSIS — G8929 Other chronic pain: Secondary | ICD-10-CM

## 2021-12-04 DIAGNOSIS — M5441 Lumbago with sciatica, right side: Secondary | ICD-10-CM

## 2021-12-04 DIAGNOSIS — M5432 Sciatica, left side: Secondary | ICD-10-CM

## 2021-12-04 DIAGNOSIS — Q675 Congenital deformity of spine: Secondary | ICD-10-CM

## 2021-12-04 DIAGNOSIS — M549 Dorsalgia, unspecified: Secondary | ICD-10-CM

## 2021-12-04 DIAGNOSIS — M5136 Other intervertebral disc degeneration, lumbar region: Secondary | ICD-10-CM

## 2021-12-04 DIAGNOSIS — M545 Low back pain, unspecified: Secondary | ICD-10-CM

## 2021-12-04 DIAGNOSIS — M542 Cervicalgia: Secondary | ICD-10-CM

## 2021-12-04 DIAGNOSIS — G894 Chronic pain syndrome: Secondary | ICD-10-CM

## 2021-12-04 DIAGNOSIS — M5431 Sciatica, right side: Secondary | ICD-10-CM

## 2021-12-04 MED ORDER — OXYCODONE-ACETAMINOPHEN 5-325 MG PO TABS: 1.0000 | ORAL_TABLET | Freq: Four times a day (QID) | ORAL | 0 refills | Status: DC | PRN

## 2021-12-04 NOTE — Progress Notes (Unsigned)
Virtual Visit via Telephone Note  I connected with Tami Hopkins on 12/04/21 at  4:35 PM EST by telephone and verified that I am speaking with the correct person using two identifiers.  Location: Patient: Home Provider: Pain control center   I discussed the limitations, risks, security and privacy concerns of performing an evaluation and management service by telephone and the availability of in person appointments. I also discussed with the patient that there may be a patient responsible charge related to this service. The patient expressed understanding and agreed to proceed.   History of Present Illness:  I spoke with Tami Hopkins today via telephone as she was unable to do the video portion of the virtual conference.  She reports that she recently fell injuring her shoulder and has multiple fractures in the shoulder.  This is followed by Dr. Kurtis Bushman and orthopedist and he is monitoring this.  She is to start physical therapy sometime soon.  He reports an increased amount of pain from the combination shoulder injury and her low back.  She had been on 2 or 3 of the oxycodone on a daily basis.  He started her on oxycodone 10 mg tablet for short-term relief of the shoulder pain but this caused her to be drowsy with increased itching she reports.  No new changes in the quality characteristic or distribution of her low back pain are noted at this time.  The shoulder pain has been her primary issue especially with trying to sleep at night.  Otherwise she is in her usual state of health.  She tolerates the 5 mg oxycodone tablets well but has breakthrough pain approximately 4 to 6 hours after each dose.  Review of systems: General: No fevers or chills Pulmonary: No shortness of breath or dyspnea Cardiac: No angina or palpitations or lightheadedness GI: No abdominal pain or constipation Psych: No depression  Observations/Objective:  Current Outpatient Medications:    baclofen (LIORESAL) 10 MG  tablet, Take 1 tablet (10 mg total) by mouth 2 (two) times daily., Disp: 60 tablet, Rfl: 3   clonazePAM (KLONOPIN) 1 MG tablet, Take 1 mg by mouth at bedtime as needed., Disp: , Rfl:    diphenoxylate-atropine (LOMOTIL) 2.5-0.025 MG per tablet, Take 1 tablet by mouth 4 (four) times daily as needed for diarrhea or loose stools., Disp: , Rfl:    FLOVENT HFA 110 MCG/ACT inhaler, Inhale 2 puffs into the lungs at bedtime. , Disp: , Rfl:    fluticasone (FLONASE) 50 MCG/ACT nasal spray, Place 1 spray into both nostrils 2 (two) times daily., Disp: , Rfl:    furosemide (LASIX) 40 MG tablet, Take 1 tablet by mouth as needed. , Disp: , Rfl:    gabapentin (NEURONTIN) 300 MG capsule, Take 1 capsule (300 mg total) by mouth 4 (four) times daily., Disp: 120 capsule, Rfl: 3   ipratropium (ATROVENT) 0.06 % nasal spray, Place into the nose., Disp: , Rfl:    KLOR-CON 10 10 MEQ tablet, Take 10 mEq by mouth as needed. , Disp: , Rfl: 5   montelukast (SINGULAIR) 10 MG tablet, Take 10 mg by mouth at bedtime., Disp: , Rfl:    [START ON 12/06/2021] oxyCODONE-acetaminophen (PERCOCET) 5-325 MG tablet, Take 1 tablet by mouth every 6 (six) hours as needed for moderate pain or severe pain., Disp: 120 tablet, Rfl: 0   pantoprazole (PROTONIX) 40 MG tablet, Take by mouth., Disp: , Rfl:    PROAIR HFA 108 (90 BASE) MCG/ACT inhaler, Inhale 2 puffs into the lungs  every 4 (four) hours as needed., Disp: , Rfl:    promethazine (PHENERGAN) 25 MG tablet, Take 50 mg by mouth as needed. , Disp: , Rfl:    risperiDONE (RISPERDAL) 1 MG tablet, Take 1 tablet (1 mg total) by mouth at bedtime. (Patient not taking: Reported on 04/07/2020), Disp: 30 tablet, Rfl: 2   topiramate (TOPAMAX) 25 MG tablet, Take 1 tablet (25 mg total) by mouth 2 (two) times daily., Disp: 60 tablet, Rfl: 0   zolpidem (AMBIEN) 10 MG tablet, Take 10 mg by mouth at bedtime as needed for sleep., Disp: , Rfl:   Past Medical History:  Diagnosis Date   Anxiety    Asthma    Bursitis  of hip bilateral    Hyperlipidemia    PTSD (post-traumatic stress disorder)    Scoliosis     Assessment and Plan: 1. Chronic pain syndrome   2. Chronic, continuous use of opioids   3. Cervicalgia   4. Bilateral sciatica   5. DDD (degenerative disc disease), lumbar   6. Musculoskeletal back pain   7. Congenital scoliosis   8. Chronic right-sided low back pain with right-sided sciatica   9. Chronic hip pain, right   10. Back pain at L4-L5 level   Based on our discussion today and it is reasonable to refill her medications.  She has been compliant with her medicines with no evidence of any diverting or illicit use and continues to derive good functional benefits from the opioid management.  Unfortunately she has failed more conservative therapy.  She does not do well with the 10 mg oxycodone tablets and this is been documented.  As such I am going move her to a every 6 hour oxycodone 5 mg tablet for the next month.  This to be for 120 tablets scheduled for fill on February 14 with a schedule I month return to clinic.  I want her to continue follow-up with her physical therapist and Dr. Harlow Mares for her shoulder and hopefully once this is either surgically repaired or healed we will be able to wean her back down to the 2 or 3 tablets/day dosing regimen.  No other changes are made today.  Continue follow-up with her primary care physician for baseline medical care.  Follow Up Instructions:    I discussed the assessment and treatment plan with the patient. The patient was provided an opportunity to ask questions and all were answered. The patient agreed with the plan and demonstrated an understanding of the instructions.   The patient was advised to call back or seek an in-person evaluation if the symptoms worsen or if the condition fails to improve as anticipated.  I provided 30 minutes of non-face-to-face time during this encounter.   Molli Barrows, MD

## 2021-12-28 ENCOUNTER — Encounter: Payer: Self-pay | Admitting: Anesthesiology

## 2021-12-28 ENCOUNTER — Ambulatory Visit: Payer: Medicare HMO | Attending: Anesthesiology | Admitting: Anesthesiology

## 2021-12-28 ENCOUNTER — Other Ambulatory Visit: Payer: Self-pay

## 2021-12-28 DIAGNOSIS — M542 Cervicalgia: Secondary | ICD-10-CM | POA: Diagnosis not present

## 2021-12-28 DIAGNOSIS — G894 Chronic pain syndrome: Secondary | ICD-10-CM | POA: Diagnosis not present

## 2021-12-28 DIAGNOSIS — M25512 Pain in left shoulder: Secondary | ICD-10-CM

## 2021-12-28 DIAGNOSIS — M5441 Lumbago with sciatica, right side: Secondary | ICD-10-CM

## 2021-12-28 DIAGNOSIS — M5431 Sciatica, right side: Secondary | ICD-10-CM

## 2021-12-28 DIAGNOSIS — M5136 Other intervertebral disc degeneration, lumbar region: Secondary | ICD-10-CM

## 2021-12-28 DIAGNOSIS — F1190 Opioid use, unspecified, uncomplicated: Secondary | ICD-10-CM | POA: Diagnosis not present

## 2021-12-28 DIAGNOSIS — G8929 Other chronic pain: Secondary | ICD-10-CM

## 2021-12-28 DIAGNOSIS — Q675 Congenital deformity of spine: Secondary | ICD-10-CM

## 2021-12-28 DIAGNOSIS — M25519 Pain in unspecified shoulder: Secondary | ICD-10-CM | POA: Insufficient documentation

## 2021-12-28 DIAGNOSIS — M549 Dorsalgia, unspecified: Secondary | ICD-10-CM

## 2021-12-28 DIAGNOSIS — M5432 Sciatica, left side: Secondary | ICD-10-CM

## 2021-12-28 MED ORDER — OXYCODONE-ACETAMINOPHEN 5-325 MG PO TABS: 1.0000 | ORAL_TABLET | Freq: Four times a day (QID) | ORAL | 0 refills | Status: DC | PRN

## 2021-12-28 MED ORDER — OXYCODONE-ACETAMINOPHEN 5-325 MG PO TABS: 1.0000 | ORAL_TABLET | Freq: Four times a day (QID) | ORAL | 0 refills | Status: AC | PRN

## 2021-12-28 MED ORDER — BACLOFEN 10 MG PO TABS: 10.0000 mg | ORAL_TABLET | Freq: Two times a day (BID) | ORAL | 3 refills | Status: DC

## 2021-12-28 NOTE — Progress Notes (Signed)
Virtual Visit via Telephone Note ? ?I connected with Tami Hopkins on 12/28/21 at  4:00 PM EST by telephone and verified that I am speaking with the correct person using two identifiers. ? ?Location: ?Patient: Home ?Provider: Pain control center ?  ?I discussed the limitations, risks, security and privacy concerns of performing an evaluation and management service by telephone and the availability of in person appointments. I also discussed with the patient that there may be a patient responsible charge related to this service. The patient expressed understanding and agreed to proceed. ? ? ?History of Present Illness: ?I spoke with Tami Hopkins today via telephone as she was not able to do the video portion of the virtual conference.  She reports that she still having considerable amount of left shoulder pain following her recent fall and subsequent fracture.  She is seeing Dr. Harlow Mares for this and keeps it in a sling and is due for a reevaluation here sometime in the next few weeks.  She still having a considerable amount of pain from that and continues to have baseline low back pain and neck pain.  The nature of these 2 complaints has been stable.  Unfortunately she has failed more conservative therapy and has been on chronic opioid management for those.  The Percocet she takes currently 4 times a day continues to give her good relief rated about 75%.  She has failed more conservative therapy but has no side effects with this regimen and has been compliant with the regimen.  She denies any diverting or illicit use and has no side effects with the regimen.  Otherwise she is in her usual state of health at this time. ? ?Review of systems: ?General: No fevers or chills ?Pulmonary: No shortness of breath or dyspnea ?Cardiac: No angina or palpitations or lightheadedness ?GI: No abdominal pain or constipation ?Psych: No depression  ?  ?Observations/Objective: ? ?Current Outpatient Medications:  ?  [START ON 02/03/2022]  oxyCODONE-acetaminophen (PERCOCET) 5-325 MG tablet, Take 1 tablet by mouth every 6 (six) hours as needed for moderate pain or severe pain., Disp: 120 tablet, Rfl: 0 ?  baclofen (LIORESAL) 10 MG tablet, Take 1 tablet (10 mg total) by mouth 2 (two) times daily., Disp: 60 tablet, Rfl: 3 ?  clonazePAM (KLONOPIN) 1 MG tablet, Take 1 mg by mouth at bedtime as needed., Disp: , Rfl:  ?  diphenoxylate-atropine (LOMOTIL) 2.5-0.025 MG per tablet, Take 1 tablet by mouth 4 (four) times daily as needed for diarrhea or loose stools., Disp: , Rfl:  ?  FLOVENT HFA 110 MCG/ACT inhaler, Inhale 2 puffs into the lungs at bedtime. , Disp: , Rfl:  ?  fluticasone (FLONASE) 50 MCG/ACT nasal spray, Place 1 spray into both nostrils 2 (two) times daily., Disp: , Rfl:  ?  furosemide (LASIX) 40 MG tablet, Take 1 tablet by mouth as needed. , Disp: , Rfl:  ?  gabapentin (NEURONTIN) 300 MG capsule, Take 1 capsule (300 mg total) by mouth 4 (four) times daily., Disp: 120 capsule, Rfl: 3 ?  ipratropium (ATROVENT) 0.06 % nasal spray, Place into the nose., Disp: , Rfl:  ?  KLOR-CON 10 10 MEQ tablet, Take 10 mEq by mouth as needed. , Disp: , Rfl: 5 ?  montelukast (SINGULAIR) 10 MG tablet, Take 10 mg by mouth at bedtime., Disp: , Rfl:  ?  [START ON 01/05/2022] oxyCODONE-acetaminophen (PERCOCET) 5-325 MG tablet, Take 1 tablet by mouth every 6 (six) hours as needed for moderate pain or severe pain., Disp: 120  tablet, Rfl: 0 ?  pantoprazole (PROTONIX) 40 MG tablet, Take by mouth., Disp: , Rfl:  ?  PROAIR HFA 108 (90 BASE) MCG/ACT inhaler, Inhale 2 puffs into the lungs every 4 (four) hours as needed., Disp: , Rfl:  ?  promethazine (PHENERGAN) 25 MG tablet, Take 50 mg by mouth as needed. , Disp: , Rfl:  ?  risperiDONE (RISPERDAL) 1 MG tablet, Take 1 tablet (1 mg total) by mouth at bedtime. (Patient not taking: Reported on 04/07/2020), Disp: 30 tablet, Rfl: 2 ?  topiramate (TOPAMAX) 25 MG tablet, Take 1 tablet (25 mg total) by mouth 2 (two) times daily., Disp: 60  tablet, Rfl: 0 ?  zolpidem (AMBIEN) 10 MG tablet, Take 10 mg by mouth at bedtime as needed for sleep., Disp: , Rfl:   ? ?Past Medical History:  ?Diagnosis Date  ? Anxiety   ? Asthma   ? Bursitis of hip bilateral   ? Hyperlipidemia   ? PTSD (post-traumatic stress disorder)   ? Scoliosis   ?  ? ?Assessment and Plan: ?Based on our discussion today and upon review of the [facility] practitioner database information it is appropriate to refill her medicines for the next 2 months dated for March 17 and April 16.  No other changes in her pharmacologic regimen will be initiated.  She is still taking gabapentin and has no side effects with this either.  It is working well for her and her baclofen is due for refill at 2 tablets/day.  This combination is keeping her active and functional she reports.  No side effects with the medications are noted.  She is trying to get out and do some stretching strengthening and walking with the warmer weather.  Otherwise her primary issue at this point is her shoulder.  I want her to continue follow-up with her primary care physicians as well as Dr. Harlow Mares for orthopedic and baseline medical care. ? ?Follow Up Instructions: ? ?  ?I discussed the assessment and treatment plan with the patient. The patient was provided an opportunity to ask questions and all were answered. The patient agreed with the plan and demonstrated an understanding of the instructions. ?  ?The patient was advised to call back or seek an in-person evaluation if the symptoms worsen or if the condition fails to improve as anticipated. ? ?I provided 30 minutes of non-face-to-face time during this encounter. ? ? ?Molli Barrows, MD  ?

## 2022-02-05 ENCOUNTER — Other Ambulatory Visit: Payer: Self-pay

## 2022-02-05 ENCOUNTER — Telehealth: Payer: Self-pay | Admitting: Anesthesiology

## 2022-02-05 MED ORDER — OXYCODONE-ACETAMINOPHEN 5-325 MG PO TABS: 1.0000 | ORAL_TABLET | Freq: Four times a day (QID) | ORAL | 0 refills | Status: DC | PRN

## 2022-02-05 NOTE — Addendum Note (Signed)
Addended by: Molli Barrows on: 02/05/2022 10:27 AM ? ? Modules accepted: Orders ? ?

## 2022-02-05 NOTE — Telephone Encounter (Signed)
[facility] has Percocet 5/325 mg.  MEssage sent to Dr [name] to resend script. ?

## 2022-02-06 ENCOUNTER — Other Ambulatory Visit: Payer: Self-pay | Admitting: *Deleted

## 2022-02-06 ENCOUNTER — Telehealth: Payer: Self-pay | Admitting: Anesthesiology

## 2022-02-06 NOTE — Telephone Encounter (Signed)
Patient states [facility] can cover her meds. They dont know how long they will have enough so please send asap ?

## 2022-02-06 NOTE — Telephone Encounter (Signed)
Patient stated she need med [facility]ills [facility], due to [facility] being out. Please give patient a call. Thanks ?

## 2022-02-06 NOTE — Telephone Encounter (Signed)
I called [facility], they have the Rx for Oxy/Acet ready for pick up. I called patient, notified her of this per voicemail. ?

## 2022-02-06 NOTE — Telephone Encounter (Signed)
Patient will call other pharmacies to inquire who has Oxy/Acet in stock. ?

## 2022-02-14 ENCOUNTER — Ambulatory Visit: Payer: Medicare HMO | Admitting: Anesthesiology

## 2022-02-28 ENCOUNTER — Encounter: Payer: Self-pay | Admitting: Anesthesiology

## 2022-02-28 ENCOUNTER — Ambulatory Visit: Payer: Medicare HMO | Attending: Anesthesiology | Admitting: Anesthesiology

## 2022-02-28 DIAGNOSIS — M5432 Sciatica, left side: Secondary | ICD-10-CM

## 2022-02-28 DIAGNOSIS — M25551 Pain in right hip: Secondary | ICD-10-CM

## 2022-02-28 DIAGNOSIS — G8929 Other chronic pain: Secondary | ICD-10-CM

## 2022-02-28 DIAGNOSIS — M25512 Pain in left shoulder: Secondary | ICD-10-CM

## 2022-02-28 DIAGNOSIS — Q675 Congenital deformity of spine: Secondary | ICD-10-CM

## 2022-02-28 DIAGNOSIS — G894 Chronic pain syndrome: Secondary | ICD-10-CM | POA: Diagnosis not present

## 2022-02-28 DIAGNOSIS — M5431 Sciatica, right side: Secondary | ICD-10-CM

## 2022-02-28 DIAGNOSIS — M549 Dorsalgia, unspecified: Secondary | ICD-10-CM

## 2022-02-28 DIAGNOSIS — M542 Cervicalgia: Secondary | ICD-10-CM | POA: Diagnosis not present

## 2022-02-28 DIAGNOSIS — F1190 Opioid use, unspecified, uncomplicated: Secondary | ICD-10-CM

## 2022-02-28 DIAGNOSIS — M5136 Other intervertebral disc degeneration, lumbar region: Secondary | ICD-10-CM

## 2022-02-28 DIAGNOSIS — M51369 Other intervertebral disc degeneration, lumbar region without mention of lumbar back pain or lower extremity pain: Secondary | ICD-10-CM

## 2022-02-28 MED ORDER — OXYCODONE-ACETAMINOPHEN 5-325 MG PO TABS: 1.0000 | ORAL_TABLET | Freq: Four times a day (QID) | ORAL | 0 refills | Status: DC | PRN

## 2022-02-28 MED ORDER — OXYCODONE-ACETAMINOPHEN 5-325 MG PO TABS: 1.0000 | ORAL_TABLET | Freq: Four times a day (QID) | ORAL | 0 refills | Status: AC | PRN

## 2022-02-28 NOTE — Progress Notes (Signed)
Virtual Visit via Telephone Note ? ?I connected with [name] on 02/28/22 at  3:20 PM EDT by telephone and verified that I am speaking with the correct person using two identifiers. ? ?Location: ?Patient: Home ?Provider: [facility] control center ?  ?I discussed the limitations, risks, security and privacy concerns of performing an evaluation and management service by telephone and the availability of in person appointments. I also discussed with the patient that there may be a patient responsible charge related to this service. The patient expressed understanding and agreed to proceed. ? ? ?History of Present Illness: ?Is able to reach [employer] by telephone.  She could not do the video portion of the conference.  She also is currently having symptoms of bronchitis having recently been seen and started on antibiotics.  She denies any fevers or chills at this time.  She is taking her medications for her low back and this is also helping with her shoulder pain.  She has been seen by Dr. Harlow Mares for this and based on x-ray findings she reports that she is healing slowly.  The quality characteristic and distribution of her low back pain is stable in nature with no recent changes reported.  Otherwise she is in her usual state of health.  She is tolerating her opioid medications well and getting good relief.  She states that she does derive significant improvement whereas more conservative therapy was ineffective in helping with her low back pain and lower extremity pain.  No other changes are reported today. ? ?Review of systems: ?General: No fevers or chills ?Pulmonary: No shortness of breath or dyspnea ?Cardiac: No angina or palpitations or lightheadedness ?GI: No abdominal pain or constipation ?Psych: No depression  ?  ?Observations/Objective: ? ?Current Outpatient Medications:  ?  [START ON 04/07/2022] oxyCODONE-acetaminophen (PERCOCET) 5-325 MG tablet, Take 1 tablet by mouth every 6 (six) hours as needed for  moderate pain or severe pain., Disp: 120 tablet, Rfl: 0 ?  baclofen (LIORESAL) 10 MG tablet, Take 1 tablet (10 mg total) by mouth 2 (two) times daily., Disp: 60 tablet, Rfl: 3 ?  clonazePAM (KLONOPIN) 1 MG tablet, Take 1 mg by mouth at bedtime as needed., Disp: , Rfl:  ?  diphenoxylate-atropine (LOMOTIL) 2.5-0.025 MG per tablet, Take 1 tablet by mouth 4 (four) times daily as needed for diarrhea or loose stools., Disp: , Rfl:  ?  FLOVENT HFA 110 MCG/ACT inhaler, Inhale 2 puffs into the lungs at bedtime. , Disp: , Rfl:  ?  fluticasone (FLONASE) 50 MCG/ACT nasal spray, Place 1 spray into both nostrils 2 (two) times daily., Disp: , Rfl:  ?  furosemide (LASIX) 40 MG tablet, Take 1 tablet by mouth as needed. , Disp: , Rfl:  ?  gabapentin (NEURONTIN) 300 MG capsule, Take 1 capsule (300 mg total) by mouth 4 (four) times daily., Disp: 120 capsule, Rfl: 3 ?  ipratropium (ATROVENT) 0.06 % nasal spray, Place into the nose., Disp: , Rfl:  ?  KLOR-CON 10 10 MEQ tablet, Take 10 mEq by mouth as needed. , Disp: , Rfl: 5 ?  montelukast (SINGULAIR) 10 MG tablet, Take 10 mg by mouth at bedtime., Disp: , Rfl:  ?  [START ON 03/08/2022] oxyCODONE-acetaminophen (PERCOCET) 5-325 MG tablet, Take 1 tablet by mouth every 6 (six) hours as needed for moderate pain or severe pain., Disp: 120 tablet, Rfl: 0 ?  pantoprazole (PROTONIX) 40 MG tablet, Take by mouth., Disp: , Rfl:  ?  PROAIR HFA 108 (90 BASE) MCG/ACT  inhaler, Inhale 2 puffs into the lungs every 4 (four) hours as needed., Disp: , Rfl:  ?  promethazine (PHENERGAN) 25 MG tablet, Take 50 mg by mouth as needed. , Disp: , Rfl:  ?  risperiDONE (RISPERDAL) 1 MG tablet, Take 1 tablet (1 mg total) by mouth at bedtime. (Patient not taking: Reported on 04/07/2020), Disp: 30 tablet, Rfl: 2 ?  topiramate (TOPAMAX) 25 MG tablet, Take 1 tablet (25 mg total) by mouth 2 (two) times daily., Disp: 60 tablet, Rfl: 0 ?  zolpidem (AMBIEN) 10 MG tablet, Take 10 mg by mouth at bedtime as needed for sleep., Disp:  , Rfl:   ? ?Past Medical History:  ?Diagnosis Date  ? Anxiety   ? Asthma   ? Bursitis of hip bilateral   ? Hyperlipidemia   ? PTSD (post-traumatic stress disorder)   ? Scoliosis   ?  ? ?Assessment and Plan: ?1. Chronic pain syndrome   ?2. Chronic, continuous use of opioids   ?3. Cervicalgia   ?4. Bilateral sciatica   ?5. Pain in joint of left shoulder   ?6. DDD (degenerative disc disease), lumbar   ?7. Musculoskeletal back pain   ?8. Congenital scoliosis   ?9. Chronic hip pain, right   ?Based on our discussion today and upon review of the [facility] practitioner database information I think it is appropriate to refill her medications.  These will be dated for the next 2 months for May 18 and June 17.  I am going to keep her on 4 times a day dosing as this is working well for her.  She denies any side effects with the medication.  Furthermore she knows not to utilize concomitant benzodiazepine medications at the time of her dosing for the opioids.  She is to continue follow-up with Dr. Harlow Mares for her shoulder pain and we are scheduling her for return to clinic in 2 months for routine follow-up.  She will be due for routine urine screen here within the next 2 months. ? ?Follow Up Instructions: ? ?  ?I discussed the assessment and treatment plan with the patient. The patient was provided an opportunity to ask questions and all were answered. The patient agreed with the plan and demonstrated an understanding of the instructions. ?  ?The patient was advised to call back or seek an in-person evaluation if the symptoms worsen or if the condition fails to improve as anticipated. ? ?I provided 30 minutes of non-face-to-face time during this encounter. ? ? ?Molli Barrows, MD  ?

## 2022-03-05 ENCOUNTER — Ambulatory Visit: Payer: Medicare HMO | Admitting: Anesthesiology

## 2022-03-28 ENCOUNTER — Other Ambulatory Visit: Payer: Self-pay | Admitting: *Deleted

## 2022-03-28 ENCOUNTER — Other Ambulatory Visit: Payer: Self-pay | Admitting: Anesthesiology

## 2022-03-28 ENCOUNTER — Telehealth: Payer: Self-pay | Admitting: Anesthesiology

## 2022-03-28 MED ORDER — BACLOFEN 10 MG PO TABS
10.0000 mg | ORAL_TABLET | Freq: Two times a day (BID) | ORAL | 0 refills | Status: DC
Start: 2022-03-28 — End: 2022-07-16

## 2022-03-28 NOTE — Telephone Encounter (Signed)
Dr [name] has sent in Gabapentin and Baclofen today.  Called patient and let her know.

## 2022-03-28 NOTE — Telephone Encounter (Signed)
Patient stated her Gabapentin needed to be called into pharmacy. Pt is out ,please give Pt a call. Thanks

## 2022-04-06 ENCOUNTER — Telehealth: Payer: Self-pay | Admitting: Anesthesiology

## 2022-04-06 NOTE — Telephone Encounter (Signed)
Patient lvm at 11:03 stating [facility] does not have her pain meds. Needs to know what to do.

## 2022-04-09 ENCOUNTER — Other Ambulatory Visit: Payer: Self-pay

## 2022-04-09 MED ORDER — OXYCODONE-ACETAMINOPHEN 5-325 MG PO TABS: 1.0000 | ORAL_TABLET | Freq: Four times a day (QID) | ORAL | 0 refills | Status: DC | PRN

## 2022-04-09 NOTE — Telephone Encounter (Signed)
Refill request sent to Dr Dossie Arbour.  Original prescription called and cancelled.

## 2022-04-09 NOTE — Telephone Encounter (Signed)
Patient notified to try to call and find a pharmacy that has the medications and we will resend the script to that pharmacy.

## 2022-04-09 NOTE — Telephone Encounter (Signed)
[facility] in [place] has her medicine. Please call out to them.

## 2022-04-18 ENCOUNTER — Other Ambulatory Visit: Payer: Self-pay | Admitting: Family Medicine

## 2022-04-18 DIAGNOSIS — Z1231 Encounter for screening mammogram for malignant neoplasm of breast: Secondary | ICD-10-CM

## 2022-04-23 ENCOUNTER — Encounter: Payer: Medicare HMO | Admitting: Anesthesiology

## 2022-04-30 ENCOUNTER — Ambulatory Visit: Payer: Medicare HMO | Admitting: Anesthesiology

## 2022-04-30 MED ORDER — OXYCODONE-ACETAMINOPHEN 5-325 MG PO TABS: 1.0000 | ORAL_TABLET | Freq: Four times a day (QID) | ORAL | 0 refills | Status: AC | PRN

## 2022-04-30 MED ORDER — OXYCODONE-ACETAMINOPHEN 5-325 MG PO TABS
1.0000 | ORAL_TABLET | Freq: Four times a day (QID) | ORAL | 0 refills | Status: AC | PRN
Start: 2022-06-08 — End: 2022-07-08

## 2022-05-02 ENCOUNTER — Encounter: Payer: Self-pay | Admitting: Anesthesiology

## 2022-05-02 ENCOUNTER — Ambulatory Visit: Payer: Medicare HMO | Attending: Anesthesiology | Admitting: Anesthesiology

## 2022-05-02 ENCOUNTER — Other Ambulatory Visit: Payer: Self-pay | Admitting: *Deleted

## 2022-05-02 VITALS — BP 150/107 | HR 87 | Temp 97.9°F | Resp 16 | Ht 68.0 in | Wt 194.0 lb

## 2022-05-02 DIAGNOSIS — F1190 Opioid use, unspecified, uncomplicated: Secondary | ICD-10-CM | POA: Insufficient documentation

## 2022-05-02 DIAGNOSIS — G894 Chronic pain syndrome: Secondary | ICD-10-CM | POA: Insufficient documentation

## 2022-05-02 DIAGNOSIS — M25551 Pain in right hip: Secondary | ICD-10-CM | POA: Diagnosis present

## 2022-05-02 DIAGNOSIS — M542 Cervicalgia: Secondary | ICD-10-CM | POA: Insufficient documentation

## 2022-05-02 DIAGNOSIS — M5432 Sciatica, left side: Secondary | ICD-10-CM | POA: Insufficient documentation

## 2022-05-02 DIAGNOSIS — M5136 Other intervertebral disc degeneration, lumbar region: Secondary | ICD-10-CM | POA: Diagnosis present

## 2022-05-02 DIAGNOSIS — G8929 Other chronic pain: Secondary | ICD-10-CM | POA: Diagnosis present

## 2022-05-02 DIAGNOSIS — M549 Dorsalgia, unspecified: Secondary | ICD-10-CM | POA: Diagnosis present

## 2022-05-02 DIAGNOSIS — M5431 Sciatica, right side: Secondary | ICD-10-CM | POA: Diagnosis present

## 2022-05-02 MED ORDER — ROPIVACAINE HCL 2 MG/ML IJ SOLN
10.0000 mL | Freq: Once | INTRAMUSCULAR | Status: DC
  Filled 2022-05-02: qty 20

## 2022-05-02 MED ORDER — DEXAMETHASONE SODIUM PHOSPHATE 10 MG/ML IJ SOLN
10.0000 mg | Freq: Once | INTRAMUSCULAR | Status: AC
  Administered 2022-05-02: 10 mg
  Filled 2022-05-02: qty 1

## 2022-05-02 NOTE — Progress Notes (Signed)
Nursing Pain Medication Assessment:  Safety precautions to be maintained throughout the outpatient stay will include: orient to surroundings, keep bed in low position, maintain call bell within reach at all times, provide assistance with transfer out of bed and ambulation.  Medication Inspection Compliance: Pill count conducted under aseptic conditions, in front of the patient. Neither the pills nor the bottle was removed from the patient's sight at any time. Once count was completed pills were immediately returned to the patient in their original bottle.  Medication: Oxycodone/APAP Pill/Patch Count:  35 of 120 pills remain Pill/Patch Appearance: Markings consistent with prescribed medication Bottle Appearance: Standard pharmacy container. Clearly labeled. Filled Date: 06 / 19 / 2023 Last Medication intake:  Yesterday Safety precautions to be maintained throughout the outpatient stay will include: orient to surroundings, keep bed in low position, maintain call bell within reach at all times, provide assistance with transfer out of bed and ambulation.
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
## 2022-05-02 NOTE — Patient Instructions (Signed)

## 2022-05-07 LAB — TOXASSURE SELECT 13 (MW), URINE

## 2022-07-06 ENCOUNTER — Other Ambulatory Visit: Payer: Self-pay | Admitting: Anesthesiology

## 2022-07-09 ENCOUNTER — Telehealth: Payer: Self-pay | Admitting: Anesthesiology

## 2022-07-09 ENCOUNTER — Other Ambulatory Visit: Payer: Self-pay | Admitting: *Deleted

## 2022-07-09 MED ORDER — GABAPENTIN 300 MG PO CAPS: ORAL_CAPSULE | ORAL | 3 refills | Status: DC

## 2022-07-09 NOTE — Telephone Encounter (Signed)
Needs gabapentin refill sent in

## 2022-07-09 NOTE — Telephone Encounter (Signed)
Medication refill request sent.  

## 2022-07-16 ENCOUNTER — Encounter: Payer: Self-pay | Admitting: Anesthesiology

## 2022-07-16 ENCOUNTER — Ambulatory Visit: Payer: Medicare HMO | Attending: Anesthesiology | Admitting: Anesthesiology

## 2022-07-16 DIAGNOSIS — G894 Chronic pain syndrome: Secondary | ICD-10-CM

## 2022-07-16 DIAGNOSIS — M542 Cervicalgia: Secondary | ICD-10-CM

## 2022-07-16 DIAGNOSIS — M5432 Sciatica, left side: Secondary | ICD-10-CM

## 2022-07-16 DIAGNOSIS — M5136 Other intervertebral disc degeneration, lumbar region: Secondary | ICD-10-CM

## 2022-07-16 DIAGNOSIS — F1190 Opioid use, unspecified, uncomplicated: Secondary | ICD-10-CM

## 2022-07-16 DIAGNOSIS — G8929 Other chronic pain: Secondary | ICD-10-CM

## 2022-07-16 DIAGNOSIS — M5431 Sciatica, right side: Secondary | ICD-10-CM

## 2022-07-16 DIAGNOSIS — M549 Dorsalgia, unspecified: Secondary | ICD-10-CM

## 2022-07-16 DIAGNOSIS — M25551 Pain in right hip: Secondary | ICD-10-CM

## 2022-07-16 MED ORDER — OXYCODONE-ACETAMINOPHEN 5-325 MG PO TABS: 1.0000 | ORAL_TABLET | Freq: Four times a day (QID) | ORAL | 0 refills | Status: AC | PRN

## 2022-07-16 MED ORDER — BACLOFEN 10 MG PO TABS: 10.0000 mg | ORAL_TABLET | Freq: Two times a day (BID) | ORAL | 3 refills | Status: DC

## 2022-07-16 NOTE — Progress Notes (Signed)
Virtual Visit via Telephone Note  I connected with Tami Hopkins on 07/16/22 at 11:45 AM EDT by telephone and verified that I am speaking with the correct person using two identifiers.  Location: Patient: Home Provider: [facility] control center   I discussed the limitations, risks, security and privacy concerns of performing an evaluation and management service by telephone and the availability of in person appointments. I also discussed with the patient that there may be a patient responsible charge related to this service. The patient expressed understanding and agreed to proceed.   History of Present Illness: I spoke with Tami Hopkins via telephone as we were unable like for the video portion of the carpets.  She reports that her low back pain and leg pain are stable in nature with no recent changes.  She still having considerable amount of trouble with her wrist pain.  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  The quality of her low back pain and leg pain is stable with no recent changes noted.  Lower extremity strength function bowel and bladder function are stable with no changes in the quality characteristic distribution noted.  Otherwise she is in her usual state of health.  Review of systems: General: No fevers or chills Pulmonary: No shortness of breath or dyspnea Cardiac: No angina or palpitations or lightheadedness GI: No abdominal pain or constipation Psych: No depression    Observations/Objective:  Current Outpatient Medications:    [START ON 08/07/2022] oxyCODONE-acetaminophen (PERCOCET) 5-325 MG tablet, Take 1 tablet by mouth every 6 (six) hours as needed for moderate pain or severe pain., Disp: 120 tablet, Rfl: 0   [START ON 09/06/2022] oxyCODONE-acetaminophen (PERCOCET) 5-325 MG tablet, Take 1 tablet by mouth every 6 (six) hours as needed for moderate pain or severe pain., Disp: 120 tablet, Rfl: 0   baclofen  (LIORESAL) 10 MG tablet, Take 1 tablet (10 mg total) by mouth 2 (two) times daily., Disp: 60 tablet, Rfl: 3   clonazePAM (KLONOPIN) 1 MG tablet, Take 1 mg by mouth at bedtime as needed., Disp: , Rfl:    diphenoxylate-atropine (LOMOTIL) 2.5-0.025 MG per tablet, Take 1 tablet by mouth 4 (four) times daily as needed for diarrhea or loose stools., Disp: , Rfl:    FLOVENT HFA 110 MCG/ACT inhaler, Inhale 2 puffs into the lungs at bedtime. , Disp: , Rfl:    fluticasone (FLONASE) 50 MCG/ACT nasal spray, Place 1 spray into both nostrils 2 (two) times daily., Disp: , Rfl:    furosemide (LASIX) 40 MG tablet, Take 1 tablet by mouth as needed. , Disp: , Rfl:    gabapentin (NEURONTIN) 300 MG capsule, TAKE 1 CAPSULE BY MOUTH 4 TIMES DAILY., Disp: 120 capsule, Rfl: 3   ipratropium (ATROVENT) 0.06 % nasal spray, Place into the nose., Disp: , Rfl:    KLOR-CON 10 10 MEQ tablet, Take 10 mEq by mouth as needed. , Disp: , Rfl: 5   montelukast (SINGULAIR) 10 MG tablet, Take 10 mg by mouth at bedtime., Disp: , Rfl:    pantoprazole (PROTONIX) 40 MG tablet, Take by mouth. (Patient not taking: Reported on 05/02/2022), Disp: , Rfl:    PROAIR HFA 108 (90 BASE) MCG/ACT inhaler, Inhale 2 puffs into the lungs every 4 (four) hours as needed. (Patient not taking: Reported on 05/02/2022), Disp: , Rfl:    promethazine (PHENERGAN) 25 MG tablet, Take 50 mg by mouth as needed. , Disp: , Rfl:    QUEtiapine (SEROQUEL) 100 MG  tablet, Take by mouth., Disp: , Rfl:    risperiDONE (RISPERDAL) 1 MG tablet, Take 1 tablet (1 mg total) by mouth at bedtime. (Patient not taking: Reported on 04/07/2020), Disp: 30 tablet, Rfl: 2   topiramate (TOPAMAX) 25 MG tablet, Take 1 tablet (25 mg total) by mouth 2 (two) times daily., Disp: 60 tablet, Rfl: 0   zolpidem (AMBIEN) 10 MG tablet, Take 10 mg by mouth at bedtime as needed for sleep. (Patient not taking: Reported on 05/02/2022), Disp: , Rfl:    Past Medical History:  Diagnosis Date   Anxiety    Asthma     Bursitis of hip bilateral    Hyperlipidemia    PTSD (post-traumatic stress disorder)    Scoliosis      Assessment and Plan: 1. Chronic, continuous use of opioids   2. Chronic pain syndrome   3. DDD (degenerative disc disease), lumbar   4. Cervicalgia   5. Bilateral sciatica   6. Musculoskeletal back pain   7. Chronic hip pain, right   Based on our discussion today I think is appropriate to refill her medicines for the next 2 months dated for October 17 and November 16.  No other changes in her pharmacologic regimen are initiated.  I have reviewed the [facility] practitioner database information and is appropriate for refill.  Furthermore we talked about concerns with taking her clonazepam for her anxiety and the opioids at similar times.  She knows to avoid this.  I have also encouraged her to follow-up with orthopedics for possible surgical intervention for her wrist pain.  Continue core stretching strengthening exercises as tolerated with return to clinic scheduled in 2 months.  Follow Up Instructions:    I discussed the assessment and treatment plan with the patient. The patient was provided an opportunity to ask questions and all were answered. The patient agreed with the plan and demonstrated an understanding of the instructions.   The patient was advised to call back or seek an in-person evaluation if the symptoms worsen or if the condition fails to improve as anticipated.  I provided 30 minutes of non-face-to-face time during this encounter.   Molli Barrows, MD

## 2022-07-18 ENCOUNTER — Ambulatory Visit (INDEPENDENT_AMBULATORY_CARE_PROVIDER_SITE_OTHER): Payer: Medicare HMO | Admitting: Dermatology

## 2022-07-18 ENCOUNTER — Encounter: Payer: Self-pay | Admitting: Dermatology

## 2022-07-18 DIAGNOSIS — L821 Other seborrheic keratosis: Secondary | ICD-10-CM | POA: Diagnosis not present

## 2022-07-18 DIAGNOSIS — H61001 Unspecified perichondritis of right external ear: Secondary | ICD-10-CM | POA: Diagnosis not present

## 2022-07-18 DIAGNOSIS — H61111 Acquired deformity of pinna, right ear: Secondary | ICD-10-CM

## 2022-07-18 DIAGNOSIS — D489 Neoplasm of uncertain behavior, unspecified: Secondary | ICD-10-CM

## 2022-07-18 MED ORDER — MUPIROCIN 2 % EX OINT: 1.0000 | TOPICAL_OINTMENT | Freq: Every day | CUTANEOUS | 0 refills | Status: DC

## 2022-07-18 NOTE — Patient Instructions (Addendum)
Start mupirocin 2 % ointment apply topically to ear daily and cover with small band aide     Biopsy Wound Care Instructions  Leave the original bandage on for 24 hours if possible.  If the bandage becomes soaked or soiled before that time, it is OK to remove it and examine the wound.  A small amount of post-operative bleeding is normal.  If excessive bleeding occurs, remove the bandage, place gauze over the site and apply continuous pressure (no peeking) over the area for 30 minutes. If this does not work, please call our clinic as soon as possible or page your doctor if it is after hours.   Once a day, cleanse the wound with soap and water. It is fine to shower. If a thick crust develops you may use a Q-tip dipped into dilute hydrogen peroxide (mix 1:1 with water) to dissolve it.  Hydrogen peroxide can slow the healing process, so use it only as needed.    After washing, apply petroleum jelly (Vaseline) or an antibiotic ointment if your doctor prescribed one for you, followed by a bandage.    For best healing, the wound should be covered with a layer of ointment at all times. If you are not able to keep the area covered with a bandage to hold the ointment in place, this may mean re-applying the ointment several times a day.  Continue this wound care until the wound has healed and is no longer open.   Itching and mild discomfort is normal during the healing process. However, if you develop pain or severe itching, please call our office.   If you have any discomfort, you can take Tylenol (acetaminophen) or ibuprofen as directed on the bottle. (Please do not take these if you have an allergy to them or cannot take them for another reason).  Some redness, tenderness and white or yellow material in the wound is normal healing.  If the area becomes very sore and red, or develops a thick yellow-green material (pus), it may be infected; please notify us.    If you have stitches, return to clinic as  directed to have the stitches removed. You will continue wound care for 2-3 days after the stitches are removed.   Wound healing continues for up to one year following surgery. It is not unusual to experience pain in the scar from time to time during the interval.  If the pain becomes severe or the scar thickens, you should notify the office.    A slight amount of redness in a scar is expected for the first six months.  After six months, the redness will fade and the scar will soften and fade.  The color difference becomes less noticeable with time.  If there are any problems, return for a post-op surgery check at your earliest convenience.  To improve the appearance of the scar, you can use silicone scar gel, cream, or sheets (such as Mederma or Serica) every night for up to one year. These are available over the counter (without a prescription).  Please call our office at [phone] for any questions or concerns.   Due to recent changes in healthcare laws, you may see results of your pathology and/or laboratory studies on MyChart before the doctors have had a chance to review them. We understand that in some cases there may be results that are confusing or concerning to you. Please understand that not all results are received at the same time and often the doctors may  need to interpret multiple results in order to provide you with the best plan of care or course of treatment. Therefore, we ask that you please give [place] 2 business days to thoroughly review all your results before contacting the office for clarification. Should we see a critical lab result, you will be contacted sooner.   If You Need Anything After Your Visit  If you have any questions or concerns for your doctor, please call our main line at [phone] and press option 4 to reach your doctor's medical assistant. If no one answers, please leave a voicemail as directed and we will return your call as soon as possible. Messages left  after 4 pm will be answered the following business day.   You may also send [place] a message via [place]. We typically respond to MyChart messages within 1-2 business days.  For prescription refills, please ask your pharmacy to contact our office. Our fax number is [phone].  If you have an urgent issue when the clinic is closed that cannot wait until the next business day, you can page your doctor at the number below.    Please note that while we do our best to be available for urgent issues outside of office hours, we are not available 24/7.   If you have an urgent issue and are unable to reach [place], you may choose to seek medical care at your doctor's office, retail clinic, urgent care center, or emergency room.  If you have a medical emergency, please immediately call 911 or go to the emergency department.  Pager Numbers  - Dr. Nehemiah Massed: [phone]  - Dr. Laurence Ferrari: [phone]  - Dr. Nicole Kindred: [phone]  In the event of inclement weather, please call our main line at [phone] for an update on the status of any delays or closures.  Dermatology Medication Tips: Please keep the boxes that topical medications come in in order to help keep track of the instructions about where and how to use these. Pharmacies typically print the medication instructions only on the boxes and not directly on the medication tubes.   If your medication is too expensive, please contact our office at [phone] option 4 or send [place] a message through [place].   We are unable to tell what your co-pay for medications will be in advance as this is different depending on your insurance coverage. However, we may be able to find a substitute medication at lower cost or fill out paperwork to get insurance to cover a needed medication.   If a prior authorization is required to get your medication covered by your insurance company, please allow [place] 1-2 business days to complete this process.  Drug prices often  vary depending on where the prescription is filled and some pharmacies may offer cheaper prices.  The website www.goodrx.com contains coupons for medications through different pharmacies. The prices here do not account for what the cost may be with help from insurance (it may be cheaper with your insurance), but the website can give you the price if you did not use any insurance.  - You can print the associated coupon and take it with your prescription to the pharmacy.  - You may also stop by our office during regular business hours and pick up a GoodRx coupon card.  - If you need your prescription sent electronically to a different pharmacy, notify our office through [facility] or by phone at [phone] option 4.     Si Usted Necesita Algo Despus de  Su Visita  Tambin puede enviarnos un mensaje a travs de MyChart. Por lo general respondemos a los mensajes de MyChart en el transcurso de 1 a 2 das hbiles.  Para renovar recetas, por favor pida a su farmacia que se ponga en contacto con nuestra oficina. Harland Dingwall de fax es [place] [phone].  Si tiene un asunto urgente cuando la clnica est cerrada y que no puede esperar hasta el siguiente da hbil, puede llamar/localizar a su doctor(a) al nmero que aparece a continuacin.   Por favor, tenga en cuenta que aunque hacemos todo lo posible para estar disponibles para asuntos urgentes fuera del horario de [place], no estamos disponibles las 24 horas del da, los 7 das de la [place].   Si tiene un problema urgente y no puede comunicarse con nosotros, puede optar por buscar atencin mdica  en el consultorio de su doctor(a), en una clnica privada, en un centro de atencin urgente o en una sala de emergencias.  Si tiene Engineering geologist, por favor llame inmediatamente al 911 o vaya a la sala de emergencias.  Nmeros de bper  - Dr. Nehemiah Massed: [phone]  - Dra. Moye: [phone]  - Dra. Nicole Kindred: [phone]  En caso  de inclemencias del [place], por favor llame a Johnsie Kindred principal al [phone] para una actualizacin sobre el [place] de cualquier retraso o cierre.  Consejos para la medicacin en dermatologa: Por favor, guarde las cajas en las que vienen los medicamentos de uso tpico para ayudarle a seguir las instrucciones sobre dnde y cmo usarlos. Las farmacias generalmente imprimen las instrucciones del medicamento slo en las cajas y no directamente en los tubos del [place].   Si su medicamento es muy caro, por favor, pngase en contacto con Zigmund Daniel llamando al [phone] y presione la opcin 4 o envenos un mensaje a travs de Pharmacist, community.   No podemos decirle cul ser su copago por los medicamentos por adelantado ya que esto es diferente dependiendo de la cobertura de su seguro. Sin embargo, es posible que podamos encontrar un medicamento sustituto a Electrical engineer un formulario para que el seguro cubra el medicamento que se considera necesario.   Si se requiere una autorizacin previa para que su compaa de seguros [place] su medicamento, por favor permtanos de 1 a 2 das hbiles para completar este proceso.  Los precios de los medicamentos varan con frecuencia dependiendo del Environmental consultant de dnde se surte la receta y alguna farmacias pueden ofrecer precios ms baratos.  El sitio web www.goodrx.com tiene cupones para medicamentos de Airline pilot. Los precios aqu no tienen en cuenta lo que podra costar con la ayuda del seguro (puede ser ms barato con su seguro), pero el sitio web puede darle el precio si no utiliz Research scientist (physical sciences).  - Puede imprimir el cupn correspondiente y llevarlo con su receta a la farmacia.  - Tambin puede pasar por nuestra oficina durante el horario de atencin regular y Charity fundraiser una tarjeta de cupones de GoodRx.  - Si necesita que su receta se enve electrnicamente a una farmacia diferente, informe a nuestra oficina a travs de MyChart de [place] o  por telfono llamando al [phone] y presione la opcin 4.

## 2022-07-18 NOTE — Progress Notes (Signed)
   New Patient Visit  Subjective  Tami Hopkins is a 61 y.o. female who presents for the following: New Patient (Initial Visit) (Reports painful spot at right ear, 3 + years, ).   The following portions of the chart were reviewed this encounter and updated as appropriate:   Tobacco  Allergies  Meds  Problems  Med Hx  Surg Hx  Fam Hx      Review of Systems:  No other skin or systemic complaints except as noted in HPI or Assessment and Plan.  Objective  Well appearing patient in no apparent distress; mood and affect are within normal limits.  A focused examination was performed including ears, legs. Relevant physical exam findings are noted in the Assessment and Plan.  right helix 0.5 cm eroded papule   Right Ear Right lower ear helix firm and protruding; left helix softer with less protrusion. Crusted erosion is located at the mid-helix, not at the point of greatest protrusion of cartilage    Assessment & Plan  Neoplasm of uncertain behavior right helix  Skin / nail biopsy Type of biopsy: tangential   Informed consent: discussed and consent obtained   Timeout: patient name, date of birth, surgical site, and procedure verified   Patient was prepped and draped in usual sterile fashion: Area prepped with isopropyl alcohol. Anesthesia: the lesion was anesthetized in a standard fashion   Anesthetic:  1% lidocaine w/ epinephrine 1-100,000 buffered w/ 8.4% NaHCO3 Instrument used: flexible razor blade   Hemostasis achieved with: aluminum chloride   Outcome: patient tolerated procedure well   Post-procedure details: wound care instructions given   Additional details:  Mupirocin and a bandage applied  Specimen 1 - Surgical pathology Differential Diagnosis: R/o BCC vs SCC vs CDNH  Check Margins: No  R/o BCC vs SCC vs Shambaugh  Patient denies pain with breathing, pain with nose, pain with other ear, and eye pain.   Related Medications mupirocin ointment (BACTROBAN) 2  % Apply 1 Application topically daily. Apply to wound at right ear and cover with bandaid until healed  Deformity of cartilage of right ear Right Ear  Unclear etiology but suspicious for possible calcification or change to cartilage secondary to previous inflammation or trauma  She reports history multiple episodes of right ear hurting first and then chronic pain of the ear for the last couple of years. She also reports her ear changing in shape after using voltaren gel for the pain.  She denies any eye changes, pain or inflammation or changes to the nose or other ear, chest discomfort or difficulty breathing - which makes relapsing polychondritis less likely  This may be the true cause of her pain at this ear but will see what biopsy shows before suggesting further work-up.   Recommend ibuprofen and tylenol as well as avoiding pressure as needed for ear pain     Seborrheic Keratoses At legs - Stuck-on, waxy, tan-brown papules and/or plaques  - Benign-appearing - Discussed benign etiology and prognosis. - Observe - Call for any changes  Return for 4 - 6 week tbse .  I, Ruthell Rummage, CMA, am acting as scribe for Forest Gleason, MD.  Documentation: I have reviewed the above documentation for accuracy and completeness, and I agree with the above.  Forest Gleason, MD

## 2022-07-19 ENCOUNTER — Telehealth: Payer: Self-pay

## 2022-07-19 NOTE — Telephone Encounter (Signed)
Yes. She can use lidocaine small amount up to 4 times a day. I also recommend taking ibuprofen 800 mg up to every 6 hours (this calms down inflammation) and alternating with tylenol (as directed on the package). Wait 15 minutes before lying down after taking ibuprofen.   If possible, she can try to sleep on her back to help avoid pressure. For this, I would recommend a pillow or two under her knees to ease pressure on her back. I also find that for some people some of the memory foam filling (little bits of memory foam) pillows such as MyPillow can help for transitioning to back sleeping since the fill can be adjusted to provide support for sides of the head.

## 2022-07-19 NOTE — Telephone Encounter (Signed)
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 Recommend trying to back sleep if at all possible (can use pillows under knees to help keep back comfortable). The pillows made with pieces of memory foam (such as MyPillow or Coop pillow) can sometimes help support back sleeping but also are sometimes softer on the ear and sometimes helpful with tenderness at the ear due to pressure. Sleeping with a donut pillow is another option to allow side sleeping with less pressure on the ear.  MAs please call. Thank you!

## 2022-07-19 NOTE — Telephone Encounter (Signed)
Patient notified of information per Dr. Laurence Ferrari

## 2022-07-19 NOTE — Telephone Encounter (Signed)
Patient informed of pathology results. She states that due to broken bones last year and recently last month (right wrist) she is unable to sleep on her back, and she doesn't think she will be able to sleep with a donut pillow either. Patient would like to know what other treatment options are. Patient was crying because lesion is very painful and she would like to know if you can remove it completely. Please advise.

## 2022-07-19 NOTE — Telephone Encounter (Signed)
Patient called and left a VM on the nurse line that the area on her ear that was removed yesterday is very painful and she was unable to get very much sleep last night. She would like to know if she can put Lidocaine on it for the pain? Please advise

## 2022-07-24 ENCOUNTER — Telehealth: Payer: Self-pay | Admitting: Anesthesiology

## 2022-07-24 NOTE — Telephone Encounter (Signed)
Patient called and informed to get [place] the fax number and MD name to send the surgeons letter to and call us back with that info and we will be glad to fax the form for her. Patient understands instructions.

## 2022-07-24 NOTE — Telephone Encounter (Signed)
PT stated that she has surgery on last Friday. A stent was placed in her left arm. PT was told by [facility] that she needed to get doctor [name] approve for them to write patient a prescription due to her being under contract. Please give patient a call. Thanks

## 2022-07-25 ENCOUNTER — Telehealth: Payer: Self-pay | Admitting: Anesthesiology

## 2022-07-25 NOTE — Telephone Encounter (Signed)
Surgeons Letter faxed to [facility]

## 2022-07-25 NOTE — Telephone Encounter (Signed)
PT called back with the fax number to [facility] [phone]. [facility] needs approve to give patient meds since she is under contract. Thanks

## 2022-07-30 ENCOUNTER — Telehealth: Payer: Self-pay | Admitting: Anesthesiology

## 2022-07-30 NOTE — Telephone Encounter (Signed)
PT stated that [facility] is still waiting on verification to say it's okay for her to get pain meds from. Please give patient a call. PT stated that he pain has been very bad.

## 2022-07-30 NOTE — Telephone Encounter (Signed)
Surgeon's Letter sent again to [facility]

## 2022-08-14 NOTE — Telephone Encounter (Signed)
Please advise patient I am so sorry - I did not get this phone message.  Unfortunately, there's no true way to remove the spot as there still will be skin pressed between the pillow and the cartilage. Sometimes we can make it better by freezing it in clinic to thin out the irritated/inflamed area. I am happy to add her onto clinic at the end of today or tomorrow to discuss and possibly freeze the spot. Thank you!

## 2022-08-15 NOTE — Telephone Encounter (Signed)
Spoke with patient and advised her of information per Dr.Moye. patient scheduled to be seen tomorrow. She was able to get a AM appt as patient has anxiety that does not allow her to leave the house in the afternoons. aw

## 2022-08-16 ENCOUNTER — Ambulatory Visit (INDEPENDENT_AMBULATORY_CARE_PROVIDER_SITE_OTHER): Payer: Medicare HMO | Admitting: Dermatology

## 2022-08-16 DIAGNOSIS — D2321 Other benign neoplasm of skin of right ear and external auricular canal: Secondary | ICD-10-CM

## 2022-08-16 DIAGNOSIS — D2320 Other benign neoplasm of skin of unspecified ear and external auricular canal: Secondary | ICD-10-CM

## 2022-08-16 DIAGNOSIS — L989 Disorder of the skin and subcutaneous tissue, unspecified: Secondary | ICD-10-CM | POA: Diagnosis not present

## 2022-08-16 DIAGNOSIS — D485 Neoplasm of uncertain behavior of skin: Secondary | ICD-10-CM

## 2022-08-16 NOTE — Progress Notes (Signed)
   Follow-Up Visit   Subjective  Tami Hopkins is a 61 y.o. female who presents for the following: [facility] (Of R ear - very painful, bleeds, difficult to sleep at night. Currently using a pregnancy pillow and wrapping towels around head, as well as topical lidocaine and covering with bandage. Patient would like to discuss treatment options).   The following portions of the chart were reviewed this encounter and updated as appropriate:   Tobacco  Allergies  Meds  Problems  Med Hx  Surg Hx  Fam Hx      Review of Systems:  No other skin or systemic complaints except as noted in HPI or Assessment and Plan.  Objective  Well appearing patient in no apparent distress; mood and affect are within normal limits.  A focused examination was performed including the face and ears. Relevant physical exam findings are noted in the Assessment and Plan.  Right Ear Erosion and erythema  Right Ear  Right Ear Firm erythematous papule    Assessment & Plan  Skin erosion Right Ear  Biopsy proven, with severe pain  Stressed that she must adjust pillows and find another sleeping position or way to keep pressure off of this ear for this to get better.  Advised this may take some trial and error. Recommend pillow with chunks of memory foam to minimize pressure compared to her thin firm pillow.  Start OTC zinc oxide (diaper) paste and Aquaphor TID to aa. Continue Lidocaine to aa up to 4 times per day.  Benign neoplasm of skin of auricle, unspecified laterality Right Ear  C/w chondrodermatitis nodularis helicis  Biopsy proven, with severe pain  Stressed that she must adjust pillows and find another sleeping position or way to keep pressure off of this ear for this to get better.  Advised this may take some trial and error. Recommend trying to back sleep if at all possible (can use pillows under knees to help keep back comfortable). The pillows made with pieces of memory foam (such as MyPillow  or Coop pillow) can sometimes help support back sleeping but also are sometimes softer on the ear and sometimes helpful with tenderness at the ear due to pressure. Sleeping with a donut pillow is another option to allow side sleeping with less pressure on the ear.  Continue Lidocaine to aa up to 4 times per day.  Also discussed treatment options of LN2 but advised patient that this will not cure condition but may help relieve symptoms by thinning out thickened skin   Destruction of lesion - Right Ear Complexity: simple   Destruction method: cryotherapy   Informed consent: discussed and consent obtained   Timeout:  patient name, date of birth, surgical site, and procedure verified Lesion destroyed using liquid nitrogen: Yes   Region frozen until ice ball extended beyond lesion: Yes   Outcome: patient tolerated procedure well with no complications   Post-procedure details: wound care instructions given   Additional details:  Prior to procedure, discussed risks of blister formation, small wound, skin dyspigmentation, or rare scar following cryotherapy. Recommend Vaseline ointment to treated areas while healing.    Return for appointment as scheduled.  Luther Redo, CMA, am acting as scribe for Forest Gleason, MD .  Documentation: I have reviewed the above documentation for accuracy and completeness, and I agree with the above.  Forest Gleason, MD

## 2022-08-16 NOTE — Patient Instructions (Addendum)
CHONDRODERMATITIS NODULARIS HELICIS "skin changes due to pressure from cartilage" --> Avoid pressure to ear as much as possible. Recommend trying to back sleep if at all possible (can use pillows under knees to help keep back comfortable). The pillows made with pieces of memory foam (such as MyPillow or Coop pillow) can sometimes help support back sleeping but also are sometimes softer on the ear and sometimes helpful with tenderness at the ear due to pressure. Sleeping with a donut pillow is another option to allow side sleeping with less pressure on the ear.  Due to recent changes in healthcare laws, you may see results of your pathology and/or laboratory studies on MyChart before the doctors have had a chance to review them. We understand that in some cases there may be results that are confusing or concerning to you. Please understand that not all results are received at the same time and often the doctors may need to interpret multiple results in order to provide you with the best plan of care or course of treatment. Therefore, we ask that you please give [place] 2 business days to thoroughly review all your results before contacting the office for clarification. Should we see a critical lab result, you will be contacted sooner.   If You Need Anything After Your Visit  If you have any questions or concerns for your doctor, please call our main line at [phone] and press option 4 to reach your doctor's medical assistant. If no one answers, please leave a voicemail as directed and we will return your call as soon as possible. Messages left after 4 pm will be answered the following business day.   You may also send [place] a message via [place]. We typically respond to MyChart messages within 1-2 business days.  For prescription refills, please ask your pharmacy to contact our office. Our fax number is [phone].  If you have an urgent issue when the clinic is closed that cannot wait until the next  business day, you can page your doctor at the number below.    Please note that while we do our best to be available for urgent issues outside of office hours, we are not available 24/7.   If you have an urgent issue and are unable to reach [place], you may choose to seek medical care at your doctor's office, retail clinic, urgent care center, or emergency room.  If you have a medical emergency, please immediately call 911 or go to the emergency department.  Pager Numbers  - Dr. Nehemiah Massed: [phone]  - Dr. Laurence Ferrari: [phone]  - Dr. Nicole Kindred: [phone]  In the event of inclement weather, please call our main line at [phone] for an update on the status of any delays or closures.  Dermatology Medication Tips: Please keep the boxes that topical medications come in in order to help keep track of the instructions about where and how to use these. Pharmacies typically print the medication instructions only on the boxes and not directly on the medication tubes.   If your medication is too expensive, please contact our office at [phone] option 4 or send [place] a message through [place].   We are unable to tell what your co-pay for medications will be in advance as this is different depending on your insurance coverage. However, we may be able to find a substitute medication at lower cost or fill out paperwork to get insurance to cover a needed medication.   If a prior authorization is required to get your  medication covered by your insurance company, please allow [place] 1-2 business days to complete this process.  Drug prices often vary depending on where the prescription is filled and some pharmacies may offer cheaper prices.  The website www.goodrx.com contains coupons for medications through different pharmacies. The prices here do not account for what the cost may be with help from insurance (it may be cheaper with your insurance), but the website can give you the price if you did not use  any insurance.  - You can print the associated coupon and take it with your prescription to the pharmacy.  - You may also stop by our office during regular business hours and pick up a GoodRx coupon card.  - If you need your prescription sent electronically to a different pharmacy, notify our office through [facility] or by phone at [phone] option 4.     Si Usted Necesita Algo Despus de Su Visita  Tambin puede enviarnos un mensaje a travs de Pharmacist, community. Por lo general respondemos a los mensajes de MyChart en el transcurso de 1 a 2 das hbiles.  Para renovar recetas, por favor pida a su farmacia que se ponga en contacto con nuestra oficina. Harland Dingwall de fax es [place] [phone].  Si tiene un asunto urgente cuando la clnica est cerrada y que no puede esperar hasta el siguiente da hbil, puede llamar/localizar a su doctor(a) al nmero que aparece a continuacin.   Por favor, tenga en cuenta que aunque hacemos todo lo posible para estar disponibles para asuntos urgentes fuera del horario de [place], no estamos disponibles las 24 horas del da, los 7 das de la [place].   Si tiene un problema urgente y no puede comunicarse con nosotros, puede optar por buscar atencin mdica  en el consultorio de su doctor(a), en una clnica privada, en un centro de atencin urgente o en una sala de emergencias.  Si tiene Engineering geologist, por favor llame inmediatamente al 911 o vaya a la sala de emergencias.  Nmeros de bper  - Dr. Nehemiah Massed: [phone]  - Dra. Moye: [phone]  - Dra. Nicole Kindred: [phone]  En caso de inclemencias del [place], por favor llame a Johnsie Kindred principal al [phone] para una actualizacin sobre el [place] de cualquier retraso o cierre.  Consejos para la medicacin en dermatologa: Por favor, guarde las cajas en las que vienen los medicamentos de uso tpico para ayudarle a seguir las instrucciones sobre dnde y cmo usarlos. Las farmacias  generalmente imprimen las instrucciones del medicamento slo en las cajas y no directamente en los tubos del [place].   Si su medicamento es muy caro, por favor, pngase en contacto con Zigmund Daniel llamando al [phone] y presione la opcin 4 o envenos un mensaje a travs de Pharmacist, community.   No podemos decirle cul ser su copago por los medicamentos por adelantado ya que esto es diferente dependiendo de la cobertura de su seguro. Sin embargo, es posible que podamos encontrar un medicamento sustituto a Electrical engineer un formulario para que el seguro cubra el medicamento que se considera necesario.   Si se requiere una autorizacin previa para que su compaa de seguros [place] su medicamento, por favor permtanos de 1 a 2 das hbiles para completar este proceso.  Los precios de los medicamentos varan con frecuencia dependiendo del Environmental consultant de dnde se surte la receta y alguna farmacias pueden ofrecer precios ms baratos.  El sitio web www.goodrx.com tiene cupones para medicamentos de Airline pilot. [place]  en cuenta lo que podra costar con la ayuda del seguro (puede ser ms barato con su seguro), pero el sitio web puede darle el precio si no utiliz ningn seguro.  - Puede imprimir el cupn correspondiente y llevarlo con su receta a la farmacia.  - Tambin puede pasar por nuestra oficina durante el horario de atencin regular y recoger una tarjeta de cupones de GoodRx.  - Si necesita que su receta se enve electrnicamente a una farmacia diferente, informe a nuestra oficina a travs de MyChart de [place] o por telfono llamando al [phone] y presione la opcin 4.  

## 2022-08-20 ENCOUNTER — Encounter: Payer: Self-pay | Admitting: Dermatology

## 2022-09-04 ENCOUNTER — Ambulatory Visit: Payer: Medicare HMO | Admitting: Dermatology

## 2022-10-01 ENCOUNTER — Ambulatory Visit: Payer: Medicare HMO | Attending: Anesthesiology | Admitting: Anesthesiology

## 2022-10-01 ENCOUNTER — Encounter: Payer: Self-pay | Admitting: Anesthesiology

## 2022-10-01 DIAGNOSIS — M5136 Other intervertebral disc degeneration, lumbar region: Secondary | ICD-10-CM

## 2022-10-01 DIAGNOSIS — F1190 Opioid use, unspecified, uncomplicated: Secondary | ICD-10-CM

## 2022-10-01 DIAGNOSIS — M5432 Sciatica, left side: Secondary | ICD-10-CM

## 2022-10-01 DIAGNOSIS — Z79891 Long term (current) use of opiate analgesic: Secondary | ICD-10-CM

## 2022-10-01 DIAGNOSIS — G894 Chronic pain syndrome: Secondary | ICD-10-CM

## 2022-10-01 DIAGNOSIS — G8929 Other chronic pain: Secondary | ICD-10-CM

## 2022-10-01 DIAGNOSIS — M542 Cervicalgia: Secondary | ICD-10-CM | POA: Diagnosis not present

## 2022-10-01 DIAGNOSIS — M545 Low back pain, unspecified: Secondary | ICD-10-CM

## 2022-10-01 DIAGNOSIS — M5441 Lumbago with sciatica, right side: Secondary | ICD-10-CM

## 2022-10-01 DIAGNOSIS — M5431 Sciatica, right side: Secondary | ICD-10-CM

## 2022-10-01 DIAGNOSIS — Q675 Congenital deformity of spine: Secondary | ICD-10-CM

## 2022-10-01 DIAGNOSIS — M25512 Pain in left shoulder: Secondary | ICD-10-CM

## 2022-10-01 DIAGNOSIS — M25551 Pain in right hip: Secondary | ICD-10-CM

## 2022-10-01 DIAGNOSIS — M549 Dorsalgia, unspecified: Secondary | ICD-10-CM

## 2022-10-01 MED ORDER — BACLOFEN 10 MG PO TABS: 10.0000 mg | ORAL_TABLET | Freq: Two times a day (BID) | ORAL | 3 refills | Status: DC

## 2022-10-01 MED ORDER — OXYCODONE HCL 5 MG PO TABS: 5.0000 mg | ORAL_TABLET | Freq: Four times a day (QID) | ORAL | 0 refills | Status: DC | PRN

## 2022-10-01 MED ORDER — GABAPENTIN 300 MG PO CAPS: 300.0000 mg | ORAL_CAPSULE | Freq: Four times a day (QID) | ORAL | 3 refills | Status: DC

## 2022-10-01 MED ORDER — OXYCODONE HCL 5 MG PO TABS: 5.0000 mg | ORAL_TABLET | Freq: Four times a day (QID) | ORAL | 0 refills | Status: AC | PRN

## 2022-10-01 NOTE — Progress Notes (Signed)
Virtual Visit via Telephone Note  I connected with Tami Hopkins on 10/01/22 at  3:00 PM EST by telephone and verified that I am speaking with the correct person using two identifiers.  Location: Patient: Home Provider: Pain control center   I discussed the limitations, risks, security and privacy concerns of performing an evaluation and management service by telephone and the availability of in person appointments. I also discussed with the patient that there may be a patient responsible charge related to this service. The patient expressed understanding and agreed to proceed.   History of Present Illness: I spoke with Tami Hopkins today via telephone as we were unable link for the video portion of the conference.  She reports that her low back pain and leg pain and occasional neck pain are stable in nature with no recent changes.  She continues to take her medications as prescribed using oxycodone 4 times a day effectively to help keep her pain under control.  She gets about 75% relief as reported lasting 4 to 6 hours before she has recurrence of her same baseline pain.  She also takes her baclofen to help with muscle spasms and gabapentin to help with the neuralgic characteristics of her pain.  No other changes are reported in the quality characteristic or distribution of her pain and she denies any change in lower extremity strength function or bowel or bladder function.  No side effects with her medications are reported.  Review of systems: General: No fevers or chills Pulmonary: No shortness of breath or dyspnea Cardiac: No angina or palpitations or lightheadedness GI: No abdominal pain or constipation Psych: No depression    Observations/Objective:   Current Outpatient Medications:    [START ON 11/06/2022] oxyCODONE (ROXICODONE) 5 MG immediate release tablet, Take 1 tablet (5 mg total) by mouth every 6 (six) hours as needed for severe pain., Disp: 120 tablet, Rfl: 0   [START ON  10/06/2022] oxyCODONE (ROXICODONE) 5 MG immediate release tablet, Take 1 tablet (5 mg total) by mouth every 6 (six) hours as needed for severe pain., Disp: 120 tablet, Rfl: 0   baclofen (LIORESAL) 10 MG tablet, Take 1 tablet (10 mg total) by mouth 2 (two) times daily., Disp: 60 tablet, Rfl: 3   clonazePAM (KLONOPIN) 1 MG tablet, Take 1 mg by mouth at bedtime as needed., Disp: , Rfl:    diphenoxylate-atropine (LOMOTIL) 2.5-0.025 MG per tablet, Take 1 tablet by mouth 4 (four) times daily as needed for diarrhea or loose stools., Disp: , Rfl:    FLOVENT HFA 110 MCG/ACT inhaler, Inhale 2 puffs into the lungs at bedtime. , Disp: , Rfl:    fluticasone (FLONASE) 50 MCG/ACT nasal spray, Place 1 spray into both nostrils 2 (two) times daily., Disp: , Rfl:    furosemide (LASIX) 40 MG tablet, Take 1 tablet by mouth as needed. , Disp: , Rfl:    gabapentin (NEURONTIN) 300 MG capsule, Take 1 capsule (300 mg total) by mouth 4 (four) times daily. TAKE 1 CAPSULE BY MOUTH 4 TIMES DAILY., Disp: 120 capsule, Rfl: 3   ipratropium (ATROVENT) 0.06 % nasal spray, Place into the nose., Disp: , Rfl:    KLOR-CON 10 10 MEQ tablet, Take 10 mEq by mouth as needed. , Disp: , Rfl: 5   montelukast (SINGULAIR) 10 MG tablet, Take 10 mg by mouth at bedtime., Disp: , Rfl:    mupirocin ointment (BACTROBAN) 2 %, Apply 1 Application topically daily. Apply to wound at right ear and cover with  bandaid until healed (Patient not taking: Reported on 08/16/2022), Disp: 22 g, Rfl: 0   oxyCODONE-acetaminophen (PERCOCET) 5-325 MG tablet, Take 1 tablet by mouth every 6 (six) hours as needed for moderate pain or severe pain., Disp: 120 tablet, Rfl: 0   promethazine (PHENERGAN) 25 MG tablet, Take 50 mg by mouth as needed. , Disp: , Rfl:    QUEtiapine (SEROQUEL) 100 MG tablet, Take by mouth., Disp: , Rfl:    Past Medical History:  Diagnosis Date   Anxiety    Asthma    Bursitis of hip bilateral    Hyperlipidemia    PTSD (post-traumatic stress  disorder)    Scoliosis     Assessment and Plan:  1. Chronic, continuous use of opioids   2. Chronic pain syndrome   3. DDD (degenerative disc disease), lumbar   4. Cervicalgia   5. Bilateral sciatica   6. Musculoskeletal back pain   7. Chronic hip pain, right   8. Pain in joint of left shoulder   9. Congenital scoliosis   10. Chronic right-sided low back pain with right-sided sciatica   11. Back pain at L4-L5 level   Based on our discussion today and after review of the [facility] practitioner database information I think is appropriate to refill her medications.  As such refills will be given for December 17 and January 16.  Continue with gabapentin as well and baclofen.  No other changes in her pharmacologic regimen will be initiated.  Continue stretching strengthening exercises as reviewed today and continue follow-up with primary care physicians for baseline medical care with return to clinic scheduled in 2 months. Follow Up Instructions:    I discussed the assessment and treatment plan with the patient. The patient was provided an opportunity to ask questions and all were answered. The patient agreed with the plan and demonstrated an understanding of the instructions.   The patient was advised to call back or seek an in-person evaluation if the symptoms worsen or if the condition fails to improve as anticipated.  I provided 30 minutes of non-face-to-face time during this encounter.   Molli Barrows, MD

## 2022-10-02 ENCOUNTER — Telehealth: Payer: Medicare HMO | Admitting: Anesthesiology

## 2022-11-05 ENCOUNTER — Telehealth: Payer: Self-pay | Admitting: Anesthesiology

## 2022-11-05 NOTE — Telephone Encounter (Signed)
Informed patient that there is no Acetaminophen in Oxycodone. I advised her to discuss with Dr. [name] at next appt.

## 2022-11-05 NOTE — Telephone Encounter (Signed)
Patient says Dr [name] changed her medication and the oxycodone is like eating tic tac. Not helping her pain at all. Why did he change her medications from the percocet? Please call patient

## 2022-11-27 ENCOUNTER — Ambulatory Visit: Payer: Medicare HMO | Attending: Anesthesiology | Admitting: Anesthesiology

## 2022-11-27 ENCOUNTER — Encounter: Payer: Self-pay | Admitting: Anesthesiology

## 2022-11-27 DIAGNOSIS — M25551 Pain in right hip: Secondary | ICD-10-CM

## 2022-11-27 DIAGNOSIS — M5136 Other intervertebral disc degeneration, lumbar region: Secondary | ICD-10-CM

## 2022-11-27 DIAGNOSIS — M5441 Lumbago with sciatica, right side: Secondary | ICD-10-CM

## 2022-11-27 DIAGNOSIS — M5431 Sciatica, right side: Secondary | ICD-10-CM

## 2022-11-27 DIAGNOSIS — G894 Chronic pain syndrome: Secondary | ICD-10-CM

## 2022-11-27 DIAGNOSIS — M5432 Sciatica, left side: Secondary | ICD-10-CM

## 2022-11-27 DIAGNOSIS — M25512 Pain in left shoulder: Secondary | ICD-10-CM

## 2022-11-27 DIAGNOSIS — M545 Low back pain, unspecified: Secondary | ICD-10-CM

## 2022-11-27 DIAGNOSIS — M542 Cervicalgia: Secondary | ICD-10-CM

## 2022-11-27 DIAGNOSIS — G8929 Other chronic pain: Secondary | ICD-10-CM

## 2022-11-27 DIAGNOSIS — Q675 Congenital deformity of spine: Secondary | ICD-10-CM

## 2022-11-27 DIAGNOSIS — M549 Dorsalgia, unspecified: Secondary | ICD-10-CM | POA: Diagnosis not present

## 2022-11-27 DIAGNOSIS — M51369 Other intervertebral disc degeneration, lumbar region without mention of lumbar back pain or lower extremity pain: Secondary | ICD-10-CM

## 2022-11-27 DIAGNOSIS — F1190 Opioid use, unspecified, uncomplicated: Secondary | ICD-10-CM

## 2022-11-27 MED ORDER — OXYCODONE HCL 5 MG PO TABS: 5.0000 mg | ORAL_TABLET | Freq: Four times a day (QID) | ORAL | 0 refills | Status: AC | PRN

## 2022-11-27 MED ORDER — OXYCODONE HCL 5 MG PO TABS: 5.0000 mg | ORAL_TABLET | Freq: Four times a day (QID) | ORAL | 0 refills | Status: DC | PRN

## 2022-11-27 NOTE — Progress Notes (Signed)
Virtual Visit via Telephone Note  I connected with Tami Hopkins on 11/27/22 at 11:00 AM EST by telephone and verified that I am speaking with the correct person using two identifiers.  Location: Patient: Home Provider: Pain control center   I discussed the limitations, risks, security and privacy concerns of performing an evaluation and management service by telephone and the availability of in person appointments. I also discussed with the patient that there may be a patient responsible charge related to this service. The patient expressed understanding and agreed to proceed.   History of Present Illness: I spoke with Tami Hopkins via telephone as we were unable like for the video portion of the conference.  She reports that she has had some low back spasming and low back pain and recently had a friend help her pop her lower back.  Unfortunately this intensified the pain and has made it worse.  No problems with lower extremity strength function any new numbness or tingling or bowel or bladder dysfunction just some spasming of the greater severity in the low back.  This generally does improve with medication management and her baclofen.  She has been taking the baclofen 3 times a day.  She takes her oxycodone 4 times a day every 6 hours and this works well for her without side effect and keeps her pain under good control.  She is also having some issues with her neck and finger but this has been stable.  Otherwise she is in her usual state of health and no recent changes.  Review of systems: General: No fevers or chills Pulmonary: No shortness of breath or dyspnea Cardiac: No angina or palpitations or lightheadedness GI: No abdominal pain or constipation Psych: No depression    Observations/Objective:  Current Outpatient Medications:    [START ON 01/05/2023] oxyCODONE (ROXICODONE) 5 MG immediate release tablet, Take 1 tablet (5 mg total) by mouth every 6 (six) hours as needed for  severe pain., Disp: 120 tablet, Rfl: 0   baclofen (LIORESAL) 10 MG tablet, Take 1 tablet (10 mg total) by mouth 2 (two) times daily., Disp: 60 tablet, Rfl: 3   clonazePAM (KLONOPIN) 1 MG tablet, Take 1 mg by mouth at bedtime as needed., Disp: , Rfl:    diphenoxylate-atropine (LOMOTIL) 2.5-0.025 MG per tablet, Take 1 tablet by mouth 4 (four) times daily as needed for diarrhea or loose stools., Disp: , Rfl:    FLOVENT HFA 110 MCG/ACT inhaler, Inhale 2 puffs into the lungs at bedtime. , Disp: , Rfl:    fluticasone (FLONASE) 50 MCG/ACT nasal spray, Place 1 spray into both nostrils 2 (two) times daily., Disp: , Rfl:    furosemide (LASIX) 40 MG tablet, Take 1 tablet by mouth as needed. , Disp: , Rfl:    gabapentin (NEURONTIN) 300 MG capsule, Take 1 capsule (300 mg total) by mouth 4 (four) times daily. TAKE 1 CAPSULE BY MOUTH 4 TIMES DAILY., Disp: 120 capsule, Rfl: 3   ipratropium (ATROVENT) 0.06 % nasal spray, Place into the nose., Disp: , Rfl:    KLOR-CON 10 10 MEQ tablet, Take 10 mEq by mouth as needed. , Disp: , Rfl: 5   montelukast (SINGULAIR) 10 MG tablet, Take 10 mg by mouth at bedtime., Disp: , Rfl:    mupirocin ointment (BACTROBAN) 2 %, Apply 1 Application topically daily. Apply to wound at right ear and cover with bandaid until healed (Patient not taking: Reported on 08/16/2022), Disp: 22 g, Rfl: 0   [START ON 12/06/2022]  oxyCODONE (ROXICODONE) 5 MG immediate release tablet, Take 1 tablet (5 mg total) by mouth every 6 (six) hours as needed for severe pain., Disp: 120 tablet, Rfl: 0   promethazine (PHENERGAN) 25 MG tablet, Take 50 mg by mouth as needed. , Disp: , Rfl:    QUEtiapine (SEROQUEL) 100 MG tablet, Take by mouth., Disp: , Rfl:    Past Medical History:  Diagnosis Date   Anxiety    Asthma    Bursitis of hip bilateral    Hyperlipidemia    PTSD (post-traumatic stress disorder)    Scoliosis       Assessment and Plan: 1. Chronic, continuous use of opioids   2. Chronic pain syndrome    3. DDD (degenerative disc disease), lumbar   4. Cervicalgia   5. Bilateral sciatica   6. Musculoskeletal back pain   7. Chronic hip pain, right   8. Pain in joint of left shoulder   9. Congenital scoliosis   10. Chronic right-sided low back pain with right-sided sciatica   11. Back pain at L4-L5 level   Based on our conversation I think it is appropriate to refill her medicines for the next 2 months for February and March.  No changes in her oxycodone will be initiated but I think it is appropriate to increase her baclofen to 3 times a day dosing.  Continue gabapentin as it is.  I have cautioned her about repeat maneuvers such as this secondary to potential damage to the facets and worsening spasm.  She feels that the pain is improving over the past week since this occurred which would be acute on her chronic low back pain.  Encouraged her to continue with ice and heat and if this does not show some improvement to contact us in the next few days.  No other changes in her pharmacologic regimen will be initiated continue follow-up with her primary care physicians for baseline medical care  Follow Up Instructions:    I discussed the assessment and treatment plan with the patient. The patient was provided an opportunity to ask questions and all were answered. The patient agreed with the plan and demonstrated an understanding of the instructions.   The patient was advised to call back or seek an in-person evaluation if the symptoms worsen or if the condition fails to improve as anticipated.  I provided 30 minutes of non-face-to-face time during this encounter.   Molli Barrows, MD

## 2022-12-18 ENCOUNTER — Other Ambulatory Visit: Payer: Self-pay | Admitting: Dermatology

## 2022-12-18 DIAGNOSIS — D489 Neoplasm of uncertain behavior, unspecified: Secondary | ICD-10-CM

## 2022-12-25 ENCOUNTER — Ambulatory Visit: Payer: Medicare HMO | Admitting: Dermatology

## 2022-12-28 ENCOUNTER — Encounter: Payer: Medicare HMO | Admitting: Obstetrics & Gynecology

## 2023-01-02 ENCOUNTER — Ambulatory Visit
Admission: RE | Admit: 2023-01-02 | Discharge: 2023-01-02 | Disposition: A | Discharge status: Home / Self Care | Payer: Medicare HMO | Source: Ambulatory Visit | Attending: Family Medicine | Admitting: Family Medicine

## 2023-01-02 DIAGNOSIS — Z1231 Encounter for screening mammogram for malignant neoplasm of breast: Secondary | ICD-10-CM

## 2023-01-13 ENCOUNTER — Other Ambulatory Visit: Payer: Self-pay | Admitting: Dermatology

## 2023-01-13 DIAGNOSIS — D489 Neoplasm of uncertain behavior, unspecified: Secondary | ICD-10-CM

## 2023-01-23 ENCOUNTER — Other Ambulatory Visit: Payer: Self-pay | Admitting: Dermatology

## 2023-01-23 ENCOUNTER — Ambulatory Visit: Payer: Medicare HMO | Attending: Anesthesiology | Admitting: Anesthesiology

## 2023-01-23 ENCOUNTER — Encounter: Payer: Self-pay | Admitting: Anesthesiology

## 2023-01-23 DIAGNOSIS — M545 Low back pain, unspecified: Secondary | ICD-10-CM

## 2023-01-23 DIAGNOSIS — G894 Chronic pain syndrome: Secondary | ICD-10-CM

## 2023-01-23 DIAGNOSIS — M51369 Other intervertebral disc degeneration, lumbar region without mention of lumbar back pain or lower extremity pain: Secondary | ICD-10-CM

## 2023-01-23 DIAGNOSIS — M5136 Other intervertebral disc degeneration, lumbar region: Secondary | ICD-10-CM

## 2023-01-23 DIAGNOSIS — M5431 Sciatica, right side: Secondary | ICD-10-CM

## 2023-01-23 DIAGNOSIS — F1190 Opioid use, unspecified, uncomplicated: Secondary | ICD-10-CM

## 2023-01-23 DIAGNOSIS — M549 Dorsalgia, unspecified: Secondary | ICD-10-CM

## 2023-01-23 DIAGNOSIS — G8929 Other chronic pain: Secondary | ICD-10-CM

## 2023-01-23 DIAGNOSIS — M542 Cervicalgia: Secondary | ICD-10-CM

## 2023-01-23 DIAGNOSIS — M25551 Pain in right hip: Secondary | ICD-10-CM | POA: Diagnosis not present

## 2023-01-23 DIAGNOSIS — M5432 Sciatica, left side: Secondary | ICD-10-CM

## 2023-01-23 DIAGNOSIS — M25512 Pain in left shoulder: Secondary | ICD-10-CM

## 2023-01-23 DIAGNOSIS — D489 Neoplasm of uncertain behavior, unspecified: Secondary | ICD-10-CM

## 2023-01-23 DIAGNOSIS — M5441 Lumbago with sciatica, right side: Secondary | ICD-10-CM

## 2023-01-23 DIAGNOSIS — Q675 Congenital deformity of spine: Secondary | ICD-10-CM

## 2023-01-23 MED ORDER — OXYCODONE HCL 5 MG PO TABS: 5.0000 mg | ORAL_TABLET | Freq: Four times a day (QID) | ORAL | 0 refills | Status: AC | PRN

## 2023-01-23 NOTE — Progress Notes (Signed)
Virtual Visit via Telephone Note  I connected with Tami Hopkins on 01/23/23 at 11:00 AM EDT by telephone and verified that I am speaking with the correct person using two identifiers.  Location: Patient: Home Provider: [facility]   I discussed the limitations, risks, security and privacy concerns of performing an evaluation and management service by telephone and the availability of in person appointments. I also discussed with the patient that there may be a patient responsible charge related to this service. The patient expressed understanding and agreed to proceed.   History of Present Illness: I spoke with Tami Hopkins via telephone as we are unable link for the video portion of the conference.  She reports that she is doing reasonably well.  She is having some muscle spasm but attributes this to the recent winter and activity.  Her neck and low back pain are without significant change in quality characteristic or distribution.  She is taking her medications including baclofen twice a day and taking her opioid medications 4 times a day.  She generally gets about 75% relief lasting about 4 to 6 hours which is consistent with what she has had in the past.  No side effects are reported.  Lower extremity strength and function bowel bladder function are all stable.  Review of systems: General: No fevers or chills Pulmonary: No shortness of breath or dyspnea Cardiac: No angina or palpitations or lightheadedness GI: No abdominal pain or constipation Psych: No depression    Observations/Objective:  Current Outpatient Medications:    baclofen (LIORESAL) 10 MG tablet, Take 1 tablet (10 mg total) by mouth 2 (two) times daily., Disp: 60 tablet, Rfl: 3   clonazePAM (KLONOPIN) 1 MG tablet, Take 1 mg by mouth at bedtime as needed., Disp: , Rfl:    diphenoxylate-atropine (LOMOTIL) 2.5-0.025 MG per tablet, Take 1 tablet by mouth 4 (four) times daily as needed for diarrhea or loose  stools., Disp: , Rfl:    FLOVENT HFA 110 MCG/ACT inhaler, Inhale 2 puffs into the lungs at bedtime. , Disp: , Rfl:    fluticasone (FLONASE) 50 MCG/ACT nasal spray, Place 1 spray into both nostrils 2 (two) times daily., Disp: , Rfl:    furosemide (LASIX) 40 MG tablet, Take 1 tablet by mouth as needed. , Disp: , Rfl:    gabapentin (NEURONTIN) 300 MG capsule, Take 1 capsule (300 mg total) by mouth 4 (four) times daily. TAKE 1 CAPSULE BY MOUTH 4 TIMES DAILY., Disp: 120 capsule, Rfl: 3   ipratropium (ATROVENT) 0.06 % nasal spray, Place into the nose., Disp: , Rfl:    KLOR-CON 10 10 MEQ tablet, Take 10 mEq by mouth as needed. , Disp: , Rfl: 5   montelukast (SINGULAIR) 10 MG tablet, Take 10 mg by mouth at bedtime., Disp: , Rfl:    mupirocin ointment (BACTROBAN) 2 %, APPLY 1 APPLICATION TOPICALLY DAILY. APPLY TO WOUND AT RIGHT EAR AND COVER WITH BANDAID UNTIL HEALED, Disp: 22 g, Rfl: 0   [START ON 02/04/2023] oxyCODONE (ROXICODONE) 5 MG immediate release tablet, Take 1 tablet (5 mg total) by mouth every 6 (six) hours as needed for severe pain., Disp: 120 tablet, Rfl: 0   [START ON 03/06/2023] oxyCODONE (ROXICODONE) 5 MG immediate release tablet, Take 1 tablet (5 mg total) by mouth every 6 (six) hours as needed for severe pain., Disp: 120 tablet, Rfl: 0   promethazine (PHENERGAN) 25 MG tablet, Take 50 mg by mouth as needed. , Disp: , Rfl:  QUEtiapine (SEROQUEL) 100 MG tablet, Take by mouth., Disp: , Rfl:    Past Medical History:  Diagnosis Date   Anxiety    Asthma    Bursitis of hip bilateral    Hyperlipidemia    PTSD (post-traumatic stress disorder)    Scoliosis      Assessment and Plan: 1. Chronic, continuous use of opioids   2. Chronic pain syndrome   3. DDD (degenerative disc disease), lumbar   4. Cervicalgia   5. Bilateral sciatica   6. Musculoskeletal back pain   7. Chronic hip pain, right   8. Pain in joint of left shoulder   9. Congenital scoliosis   10. Chronic right-sided low back  pain with right-sided sciatica   11. Back pain at L4-L5 level   Based on conversation today I think is appropriate to refill her medications for the next 2 months.  To be dated for April and May.  I have reviewed the [facility] practitioner database information and it is appropriate.  Continue stretching and ambulation with outdoor activity as tolerated.  I have encouraged this.  Follow-up in 2 months as scheduled.  Continue follow-up with her primary care physician for baseline medical care.  Follow Up Instructions:    I discussed the assessment and treatment plan with the patient. The patient was provided an opportunity to ask questions and all were answered. The patient agreed with the plan and demonstrated an understanding of the instructions.   The patient was advised to call back or seek an in-person evaluation if the symptoms worsen or if the condition fails to improve as anticipated.  I provided 30 minutes of non-face-to-face time during this encounter.   Molli Barrows, MD

## 2023-02-04 ENCOUNTER — Encounter: Payer: Self-pay | Admitting: Obstetrics & Gynecology

## 2023-02-05 ENCOUNTER — Encounter: Payer: Medicare HMO | Admitting: Obstetrics & Gynecology

## 2023-02-07 ENCOUNTER — Other Ambulatory Visit: Payer: Self-pay | Admitting: *Deleted

## 2023-02-07 ENCOUNTER — Other Ambulatory Visit: Payer: Self-pay | Admitting: Anesthesiology

## 2023-02-07 ENCOUNTER — Telehealth: Payer: Self-pay | Admitting: Anesthesiology

## 2023-02-07 NOTE — Telephone Encounter (Signed)
Patient states she needs refill on gabapentin, Dr [name] sent in other meds but gabapentin is out. Please ask Dr [name] to send in script

## 2023-02-07 NOTE — Telephone Encounter (Signed)
Rx request sent to Dr. Adams. 

## 2023-02-08 ENCOUNTER — Telehealth: Payer: Self-pay | Admitting: Anesthesiology

## 2023-02-08 NOTE — Telephone Encounter (Signed)
Patient informed that a message was sent to Dr. [name] requesting Gabapentin script.

## 2023-02-08 NOTE — Telephone Encounter (Signed)
PT stated that Dr. [name] didn't send in her refill for Gabapentin. Please give patient a call. TY

## 2023-02-11 ENCOUNTER — Telehealth: Payer: Self-pay | Admitting: Anesthesiology

## 2023-02-11 NOTE — Telephone Encounter (Signed)
Patient did not get gabapentin scripts sent in when she was here last visit. Please ask Dr [name] to send in when he comes today.

## 2023-02-18 ENCOUNTER — Telehealth: Payer: Self-pay | Admitting: Anesthesiology

## 2023-02-18 NOTE — Telephone Encounter (Signed)
Called patient, no answer. Left voicemail informing patient that no new prescriptions for medications are sent outside of appointment. I suggested she call the office and schedule appt.

## 2023-02-18 NOTE — Telephone Encounter (Signed)
Patient states she is having a lot of pain in lower back into lower extremity. Sciatica. Wants to know if Dr [name] will call her in something to help with this pain. Says the new med is not helping at all.

## 2023-02-27 ENCOUNTER — Ambulatory Visit: Payer: Medicare HMO | Attending: Anesthesiology | Admitting: Anesthesiology

## 2023-02-27 ENCOUNTER — Encounter: Payer: Self-pay | Admitting: Anesthesiology

## 2023-02-27 DIAGNOSIS — M25551 Pain in right hip: Secondary | ICD-10-CM

## 2023-02-27 DIAGNOSIS — M51369 Other intervertebral disc degeneration, lumbar region without mention of lumbar back pain or lower extremity pain: Secondary | ICD-10-CM

## 2023-02-27 DIAGNOSIS — M549 Dorsalgia, unspecified: Secondary | ICD-10-CM

## 2023-02-27 DIAGNOSIS — G894 Chronic pain syndrome: Secondary | ICD-10-CM | POA: Diagnosis not present

## 2023-02-27 DIAGNOSIS — F1190 Opioid use, unspecified, uncomplicated: Secondary | ICD-10-CM

## 2023-02-27 DIAGNOSIS — G8929 Other chronic pain: Secondary | ICD-10-CM

## 2023-02-27 DIAGNOSIS — M5432 Sciatica, left side: Secondary | ICD-10-CM

## 2023-02-27 DIAGNOSIS — M5136 Other intervertebral disc degeneration, lumbar region: Secondary | ICD-10-CM

## 2023-02-27 DIAGNOSIS — M545 Low back pain, unspecified: Secondary | ICD-10-CM

## 2023-02-27 DIAGNOSIS — M542 Cervicalgia: Secondary | ICD-10-CM

## 2023-02-27 DIAGNOSIS — M25512 Pain in left shoulder: Secondary | ICD-10-CM

## 2023-02-27 DIAGNOSIS — M5441 Lumbago with sciatica, right side: Secondary | ICD-10-CM

## 2023-02-27 DIAGNOSIS — M5431 Sciatica, right side: Secondary | ICD-10-CM

## 2023-02-27 DIAGNOSIS — Q675 Congenital deformity of spine: Secondary | ICD-10-CM

## 2023-02-27 MED ORDER — OXYCODONE-ACETAMINOPHEN 5-325 MG PO TABS: 1.0000 | ORAL_TABLET | Freq: Four times a day (QID) | ORAL | 0 refills | Status: AC | PRN

## 2023-02-27 MED ORDER — BACLOFEN 10 MG PO TABS: 10.0000 mg | ORAL_TABLET | Freq: Two times a day (BID) | ORAL | 5 refills | Status: DC

## 2023-02-27 MED ORDER — OXYCODONE-ACETAMINOPHEN 5-325 MG PO TABS: 1.0000 | ORAL_TABLET | Freq: Four times a day (QID) | ORAL | 0 refills | Status: DC | PRN

## 2023-02-27 NOTE — Progress Notes (Signed)
Virtual Visit via Telephone Note  I connected with Tami Hopkins on 02/27/23 at  2:20 PM EDT by telephone and verified that I am speaking with the correct person using two identifiers.  Location: Patient: Home Provider:  [facility] control center   I discussed the limitations, risks, security and privacy concerns of performing an evaluation and management service by telephone and the availability of in person appointments. I also discussed with the patient that there may be a patient responsible charge related to this service. The patient expressed understanding and agreed to proceed.   History of Present Illness: I was able to touch base with Tami Hopkins via telephone as we were unable like for the video portion of the conference.  She reports that her shoulder pain and hand pain have been problematic.  She continues to have low back pain comparable to what she is experienced in the past.  It is primarily a chronic aching gnawing persistent pain.  She has been less active recently and she attributes some of the increased pain secondary to that.  She takes her oxycodone 5 mg tablets 4 times a day but feels she did better on Percocet in the past.  She has been on chronic opioids for a considerable period of time secondary to the chronicity of her pain and diffuse nature of her pain.  No change in the quality characteristic or distribution is noted.  She still gets some relief with the oxycodone rated at about 40 to 50% but was at 70 to 75% as reported today with Percocet in the past.  She would like to make that switch.  Otherwise she has discrete low back pain and some shoulder pain comparable to what she is having past.  She also is inquiring whether a trigger point injection would be of benefit as she has had good relief with these in the past.  Review of systems: General: No fevers or chills Pulmonary: No shortness of breath or dyspnea Cardiac: No angina or palpitations or lightheadedness GI:  No abdominal pain or constipation Psych: No depression    Observations/Objective:  Current Outpatient Medications:    [START ON 03/06/2023] oxyCODONE-acetaminophen (PERCOCET) 5-325 MG tablet, Take 1 tablet by mouth every 6 (six) hours as needed for moderate pain or severe pain., Disp: 120 tablet, Rfl: 0   [START ON 04/05/2023] oxyCODONE-acetaminophen (PERCOCET) 5-325 MG tablet, Take 1 tablet by mouth every 6 (six) hours as needed for moderate pain or severe pain., Disp: 120 tablet, Rfl: 0   baclofen (LIORESAL) 10 MG tablet, Take 1 tablet (10 mg total) by mouth 2 (two) times daily., Disp: 60 tablet, Rfl: 5   clonazePAM (KLONOPIN) 1 MG tablet, Take 1 mg by mouth at bedtime as needed., Disp: , Rfl:    diphenoxylate-atropine (LOMOTIL) 2.5-0.025 MG per tablet, Take 1 tablet by mouth 4 (four) times daily as needed for diarrhea or loose stools., Disp: , Rfl:    FLOVENT HFA 110 MCG/ACT inhaler, Inhale 2 puffs into the lungs at bedtime. , Disp: , Rfl:    fluticasone (FLONASE) 50 MCG/ACT nasal spray, Place 1 spray into both nostrils 2 (two) times daily., Disp: , Rfl:    furosemide (LASIX) 40 MG tablet, Take 1 tablet by mouth as needed. , Disp: , Rfl:    gabapentin (NEURONTIN) 300 MG capsule, TAKE 1 CAPSULE (300 MG TOTAL) BY MOUTH 4 (FOUR) TIMES DAILY. TAKE 1 CAPSULE BY MOUTH 4 TIMES DAILY., Disp: 120 capsule, Rfl: 3   ipratropium (ATROVENT) 0.06 %  nasal spray, Place into the nose., Disp: , Rfl:    KLOR-CON 10 10 MEQ tablet, Take 10 mEq by mouth as needed. , Disp: , Rfl: 5   montelukast (SINGULAIR) 10 MG tablet, Take 10 mg by mouth at bedtime., Disp: , Rfl:    mupirocin ointment (BACTROBAN) 2 %, APPLY 1 APPLICATION TOPICALLY DAILY. APPLY TO WOUND AT RIGHT EAR AND COVER WITH BANDAID UNTIL HEALED, Disp: 22 g, Rfl: 0   oxyCODONE (ROXICODONE) 5 MG immediate release tablet, Take 1 tablet (5 mg total) by mouth every 6 (six) hours as needed for severe pain., Disp: 120 tablet, Rfl: 0   [START ON 03/06/2023] oxyCODONE  (ROXICODONE) 5 MG immediate release tablet, Take 1 tablet (5 mg total) by mouth every 6 (six) hours as needed for severe pain., Disp: 120 tablet, Rfl: 0   promethazine (PHENERGAN) 25 MG tablet, Take 50 mg by mouth as needed. , Disp: , Rfl:    QUEtiapine (SEROQUEL) 100 MG tablet, Take by mouth., Disp: , Rfl:    Past Medical History:  Diagnosis Date   Anxiety    Asthma    Bursitis of hip bilateral    Hyperlipidemia    PTSD (post-traumatic stress disorder)    Scoliosis      Assessment and Plan: 1. Chronic, continuous use of opioids   2. Chronic pain syndrome   3. DDD (degenerative disc disease), lumbar   4. Cervicalgia   5. Bilateral sciatica   6. Musculoskeletal back pain   7. Chronic hip pain, right   8. Pain in joint of left shoulder   9. Congenital scoliosis   10. Chronic right-sided low back pain with right-sided sciatica   11. Back pain at L4-L5 level   Based on our conversation today I gets appropriate to schedule her for return to clinic in about 1 month for a trigger point injection.  Will do this for the right lumbar and gluteal trigger point as well as the left shoulder trigger point.  I am going to switch her from oxycodone 5 mg tablets to Percocet 5 mg tablets 4 times a day.  She is due for those on 518 and June 14.  I have reviewed the [facility] practitioner database information is appropriate for refill.  Continue with her stretching strengthening exercises with return to clinic as scheduled and continue follow-up with her primary care physicians for baseline medical care.  Once again, she knows not to take any of the benzodiazepines concomitantly with opioid therapy as we have gone over the risks of synergy and respiratory depression in great detail.  Follow Up Instructions:    I discussed the assessment and treatment plan with the patient. The patient was provided an opportunity to ask questions and all were answered. The patient agreed with the plan and demonstrated  an understanding of the instructions.   The patient was advised to call back or seek an in-person evaluation if the symptoms worsen or if the condition fails to improve as anticipated.  I provided 30 minutes of non-face-to-face time during this encounter.   Yevette Edwards, MD

## 2023-04-29 ENCOUNTER — Telehealth: Payer: Self-pay | Admitting: Anesthesiology

## 2023-04-29 NOTE — Telephone Encounter (Signed)
PT called states that she had an voicemail form Dr. [name] asking her to come in tomorrow at 9:30 am. PT call back to confirm that she can be here at 9:30

## 2023-04-30 ENCOUNTER — Ambulatory Visit: Payer: Medicare HMO | Attending: Anesthesiology | Admitting: Anesthesiology

## 2023-04-30 ENCOUNTER — Encounter: Payer: Self-pay | Admitting: Anesthesiology

## 2023-04-30 VITALS — BP 143/93 | HR 89 | Temp 97.8°F | Resp 16 | Ht 68.0 in | Wt 184.0 lb

## 2023-04-30 DIAGNOSIS — G8929 Other chronic pain: Secondary | ICD-10-CM | POA: Insufficient documentation

## 2023-04-30 DIAGNOSIS — M549 Dorsalgia, unspecified: Secondary | ICD-10-CM | POA: Diagnosis present

## 2023-04-30 DIAGNOSIS — M25551 Pain in right hip: Secondary | ICD-10-CM | POA: Diagnosis present

## 2023-04-30 DIAGNOSIS — M25512 Pain in left shoulder: Secondary | ICD-10-CM | POA: Insufficient documentation

## 2023-04-30 DIAGNOSIS — M5432 Sciatica, left side: Secondary | ICD-10-CM | POA: Diagnosis not present

## 2023-04-30 DIAGNOSIS — Q675 Congenital deformity of spine: Secondary | ICD-10-CM | POA: Diagnosis present

## 2023-04-30 DIAGNOSIS — M542 Cervicalgia: Secondary | ICD-10-CM | POA: Diagnosis not present

## 2023-04-30 DIAGNOSIS — Z79891 Long term (current) use of opiate analgesic: Secondary | ICD-10-CM

## 2023-04-30 DIAGNOSIS — F1190 Opioid use, unspecified, uncomplicated: Secondary | ICD-10-CM | POA: Diagnosis not present

## 2023-04-30 DIAGNOSIS — G894 Chronic pain syndrome: Secondary | ICD-10-CM | POA: Insufficient documentation

## 2023-04-30 DIAGNOSIS — M5431 Sciatica, right side: Secondary | ICD-10-CM | POA: Diagnosis not present

## 2023-04-30 MED ORDER — OXYCODONE-ACETAMINOPHEN 5-325 MG PO TABS: 1.0000 | ORAL_TABLET | Freq: Four times a day (QID) | ORAL | 0 refills | Status: DC | PRN

## 2023-04-30 MED ORDER — GABAPENTIN 300 MG PO CAPS: 300.0000 mg | ORAL_CAPSULE | Freq: Four times a day (QID) | ORAL | 3 refills | Status: DC

## 2023-04-30 MED ORDER — OXYCODONE-ACETAMINOPHEN 5-325 MG PO TABS: 1.0000 | ORAL_TABLET | Freq: Four times a day (QID) | ORAL | 0 refills | Status: AC | PRN

## 2023-04-30 NOTE — Progress Notes (Unsigned)
Subjective:  Patient ID: Tami Hopkins, female    DOB: 19-Jul-1961  Age: 62 y.o. MRN: [id number]  CC: Back Pain (lower)   Procedure: None  HPI Tami Hopkins presents for reevaluation.  She continues to have chronic low back pain with bilateral lower extremity hip buttock and leg pain comparable to what she has had in the past.  She reports no changes in the quality characteristic or distribution of these.  They have been stable.  She takes her medications as prescribed including her baclofen and opioid regimen to keep the pain controlled and enable her to stay functional active and sleep better.  She reports no side effects.  She also has problems with left neck trapezius and left shoulder pain which has been chronic and recently more irritated.  Certain activities including daily household tasks aggravate this pain.  She denies any numbness tingling or weakness to the upper extremities.  Otherwise she reports being in her baseline medical condition.  She has had some issues with psychosocial stress.  She has been seeing a psychologist for this on a chronic ongoing basis.  Outpatient Medications Prior to Visit  Medication Sig Dispense Refill   clonazePAM (KLONOPIN) 1 MG tablet Take 1 mg by mouth at bedtime as needed.     diphenoxylate-atropine (LOMOTIL) 2.5-0.025 MG per tablet Take 1 tablet by mouth 4 (four) times daily as needed for diarrhea or loose stools.     FLOVENT HFA 110 MCG/ACT inhaler Inhale 2 puffs into the lungs at bedtime.      fluticasone (FLONASE) 50 MCG/ACT nasal spray Place 1 spray into both nostrils 2 (two) times daily.     furosemide (LASIX) 40 MG tablet Take 1 tablet by mouth as needed.      KLOR-CON 10 10 MEQ tablet Take 10 mEq by mouth as needed.   5   montelukast (SINGULAIR) 10 MG tablet Take 10 mg by mouth at bedtime.     mupirocin ointment (BACTROBAN) 2 % APPLY 1 APPLICATION TOPICALLY DAILY. APPLY TO WOUND AT RIGHT EAR AND COVER WITH BANDAID UNTIL HEALED 22 g 0    promethazine (PHENERGAN) 25 MG tablet Take 50 mg by mouth as needed.      QUEtiapine (SEROQUEL) 100 MG tablet Take by mouth.     gabapentin (NEURONTIN) 300 MG capsule TAKE 1 CAPSULE (300 MG TOTAL) BY MOUTH 4 (FOUR) TIMES DAILY. TAKE 1 CAPSULE BY MOUTH 4 TIMES DAILY. 120 capsule 3   oxyCODONE-acetaminophen (PERCOCET) 5-325 MG tablet Take 1 tablet by mouth every 6 (six) hours as needed for moderate pain or severe pain. 120 tablet 0   baclofen (LIORESAL) 10 MG tablet Take 1 tablet (10 mg total) by mouth 2 (two) times daily. 60 tablet 5   ipratropium (ATROVENT) 0.06 % nasal spray Place into the nose.     No facility-administered medications prior to visit.    Review of Systems CNS: No confusion or sedation Cardiac: No angina or palpitations GI: No abdominal pain or constipation Constitutional: No nausea vomiting fevers or chills  Objective:  BP (!) 143/93   Pulse 89   Temp 97.8 F (36.6 C)   Resp 16   Ht 5\' 8"  (1.727 m)   Wt 184 lb (83.5 kg)   SpO2 95%   BMI 27.98 kg/m    BP Readings from Last 3 Encounters:  04/30/23 (!) 143/93  05/02/22 (!) 150/107  04/07/20 (!) 123/102     Wt Readings from Last 3 Encounters:  04/30/23 184  lb (83.5 kg)  05/02/22 194 lb (88 kg)  04/07/20 171 lb (77.6 kg)     Physical Exam Pt is alert and oriented PERRL EOMI HEART IS RRR no murmur or rub LCTA no wheezing or rales MUSCULOSKELETAL reveals some tenderness in the posterior lumbar paraspinous musculature.  No overt trigger points are noted.  She ambulates with a mildly antalgic gait but lower extremity strength and function.  To be well-preserved and at baseline.  In regards to the left neck she has good range of motion at the atlantooccipital joint but tenderness and a trigger point in the left mid body trapezius.  This radiates into the left glenohumeral joint.  She has good strength to the upper extremities which appears to be bilateral and symmetric.  Labs  No results found for:  "HGBA1C" Lab Results  Component Value Date   CREATININE 0.72 05/14/2015    -------------------------------------------------------------------------------------------------------------------- Lab Results  Component Value Date   WBC 12.0 (H) 05/11/2015   HGB 12.1 05/11/2015   HCT 36.1 05/11/2015   PLT 223 05/11/2015   GLUCOSE 118 (H) 05/14/2015   ALT 148 (H) 05/14/2015   AST 170 (H) 05/14/2015   NA 138 05/14/2015   K 3.7 05/14/2015   CL 105 05/14/2015   CREATININE 0.72 05/14/2015   BUN 11 05/14/2015   CO2 25 05/14/2015   TSH 0.912 05/11/2015    --------------------------------------------------------------------------------------------------------------------- MM 3D SCREEN BREAST BILATERAL  Result Date: 01/03/2023 CLINICAL DATA:  Screening. EXAM: DIGITAL SCREENING BILATERAL MAMMOGRAM WITH TOMOSYNTHESIS AND CAD TECHNIQUE: Bilateral screening digital craniocaudal and mediolateral oblique mammograms were obtained. Bilateral screening digital breast tomosynthesis was performed. The images were evaluated with computer-aided detection. COMPARISON:  Previous exam(s). ACR Breast Density Category b: There are scattered areas of fibroglandular density. FINDINGS: There are no findings suspicious for malignancy. IMPRESSION: No mammographic evidence of malignancy. A result letter of this screening mammogram will be mailed directly to the patient. RECOMMENDATION: Screening mammogram in one year. (Code:[id number]) BI-RADS CATEGORY  1: Negative. Electronically Signed   By: Frederico Hamman M.D.   On: 01/03/2023 14:50     Assessment & Plan:   Tami Hopkins was seen today for back pain.  Diagnoses and all orders for this visit:  Chronic, continuous use of opioids -     ToxASSURE Select 13 (MW), Urine  Chronic pain syndrome -     ToxASSURE Select 13 (MW), Urine  Cervicalgia -     INJECT TRIGGER POINT, 1 OR 2  Bilateral sciatica  Musculoskeletal back pain  Chronic hip pain, right  Pain in  joint of left shoulder -     INJECT TRIGGER POINT, 1 OR 2  Congenital scoliosis  Other orders -     oxyCODONE-acetaminophen (PERCOCET) 5-325 MG tablet; Take 1 tablet by mouth every 6 (six) hours as needed for moderate pain or severe pain. -     oxyCODONE-acetaminophen (PERCOCET) 5-325 MG tablet; Take 1 tablet by mouth every 6 (six) hours as needed for moderate pain or severe pain. -     gabapentin (NEURONTIN) 300 MG capsule; Take 1 capsule (300 mg total) by mouth 4 (four) times daily. TAKE 1 CAPSULE BY MOUTH 4 TIMES DAILY.        ----------------------------------------------------------------------------------------------------------------------  Problem List Items Addressed This Visit       Unprioritized   Chronic hip pain, right (Chronic)   Relevant Medications   oxyCODONE-acetaminophen (PERCOCET) 5-325 MG tablet (Start on 05/05/2023)   oxyCODONE-acetaminophen (PERCOCET) 5-325 MG tablet (Start on 06/04/2023)   gabapentin (  NEURONTIN) 300 MG capsule (Start on 05/23/2023)   Chronic pain syndrome (Chronic)   Relevant Medications   oxyCODONE-acetaminophen (PERCOCET) 5-325 MG tablet (Start on 05/05/2023)   oxyCODONE-acetaminophen (PERCOCET) 5-325 MG tablet (Start on 06/04/2023)   gabapentin (NEURONTIN) 300 MG capsule (Start on 05/23/2023)   Other Relevant Orders   ToxASSURE Select 13 (MW), Urine   Congenital scoliosis (Chronic)   Cervicalgia   Relevant Orders   INJECT TRIGGER POINT, 1 OR 2   Pain in joint, shoulder region   Relevant Orders   INJECT TRIGGER POINT, 1 OR 2   Other Visit Diagnoses     Chronic, continuous use of opioids    -  Primary   Relevant Orders   ToxASSURE Select 13 (MW), Urine   Bilateral sciatica       Relevant Medications   gabapentin (NEURONTIN) 300 MG capsule (Start on 05/23/2023)   Musculoskeletal back pain       Relevant Medications   oxyCODONE-acetaminophen (PERCOCET) 5-325 MG tablet (Start on 05/05/2023)   oxyCODONE-acetaminophen (PERCOCET) 5-325 MG  tablet (Start on 06/04/2023)         ----------------------------------------------------------------------------------------------------------------------  1. Chronic, continuous use of opioids I have reviewed the [facility] practitioner database information is appropriate for refill.  She has been doing well with this regimen with no side effects reported.  Unfortunately she has failed more conservative therapy but reports good relief rated at 70 to 80% with taking her Percocet 4 times a day.  She is also taking her gabapentin and this combination has been effective for her without problem.   - ToxASSURE Select 13 (MW), Urine  2. Chronic pain syndrome Unfortunately she is not doing her physical therapy regimen on an active ongoing daily basis.  I have encouraged her to continue with this and reinstitute some daily stretching exercises as reviewed today. - ToxASSURE Select 13 (MW), Urine  3. Cervicalgia She would be a candidate for a left side trapezius trigger point injection which we will plan to do within the next few weeks. - INJECT TRIGGER POINT, 1 OR 2  4. Bilateral sciatica Continue core stretching strengthening exercises.  5. Musculoskeletal back pain As above  6. Chronic hip pain, right   7. Pain in joint of left shoulder Above - INJECT TRIGGER POINT, 1 OR 2  8. Congenital scoliosis     ----------------------------------------------------------------------------------------------------------------------  I am having Tami Hopkins start on oxyCODONE-acetaminophen. I am also having her maintain her diphenoxylate-atropine, montelukast, furosemide, fluticasone, Klor-Con 10, promethazine, Flovent HFA, ipratropium, clonazePAM, QUEtiapine, mupirocin ointment, baclofen, oxyCODONE-acetaminophen, and gabapentin.   Meds ordered this encounter  Medications   oxyCODONE-acetaminophen (PERCOCET) 5-325 MG tablet    Sig: Take 1 tablet by mouth every 6 (six) hours as  needed for moderate pain or severe pain.    Dispense:  120 tablet    Refill:  0   oxyCODONE-acetaminophen (PERCOCET) 5-325 MG tablet    Sig: Take 1 tablet by mouth every 6 (six) hours as needed for moderate pain or severe pain.    Dispense:  120 tablet    Refill:  0   gabapentin (NEURONTIN) 300 MG capsule    Sig: Take 1 capsule (300 mg total) by mouth 4 (four) times daily. TAKE 1 CAPSULE BY MOUTH 4 TIMES DAILY.    Dispense:  120 capsule    Refill:  3   Patient's Medications  New Prescriptions   OXYCODONE-ACETAMINOPHEN (PERCOCET) 5-325 MG TABLET    Take 1 tablet by mouth every 6 (six) hours  as needed for moderate pain or severe pain.  Previous Medications   BACLOFEN (LIORESAL) 10 MG TABLET    Take 1 tablet (10 mg total) by mouth 2 (two) times daily.   CLONAZEPAM (KLONOPIN) 1 MG TABLET    Take 1 mg by mouth at bedtime as needed.   DIPHENOXYLATE-ATROPINE (LOMOTIL) 2.5-0.025 MG PER TABLET    Take 1 tablet by mouth 4 (four) times daily as needed for diarrhea or loose stools.   FLOVENT HFA 110 MCG/ACT INHALER    Inhale 2 puffs into the lungs at bedtime.    FLUTICASONE (FLONASE) 50 MCG/ACT NASAL SPRAY    Place 1 spray into both nostrils 2 (two) times daily.   FUROSEMIDE (LASIX) 40 MG TABLET    Take 1 tablet by mouth as needed.    IPRATROPIUM (ATROVENT) 0.06 % NASAL SPRAY    Place into the nose.   KLOR-CON 10 10 MEQ TABLET    Take 10 mEq by mouth as needed.    MONTELUKAST (SINGULAIR) 10 MG TABLET    Take 10 mg by mouth at bedtime.   MUPIROCIN OINTMENT (BACTROBAN) 2 %    APPLY 1 APPLICATION TOPICALLY DAILY. APPLY TO WOUND AT RIGHT EAR AND COVER WITH BANDAID UNTIL HEALED   PROMETHAZINE (PHENERGAN) 25 MG TABLET    Take 50 mg by mouth as needed.    QUETIAPINE (SEROQUEL) 100 MG TABLET    Take by mouth.  Modified Medications   Modified Medication Previous Medication   GABAPENTIN (NEURONTIN) 300 MG CAPSULE gabapentin (NEURONTIN) 300 MG capsule      Take 1 capsule (300 mg total) by mouth 4 (four)  times daily. TAKE 1 CAPSULE BY MOUTH 4 TIMES DAILY.    TAKE 1 CAPSULE (300 MG TOTAL) BY MOUTH 4 (FOUR) TIMES DAILY. TAKE 1 CAPSULE BY MOUTH 4 TIMES DAILY.   OXYCODONE-ACETAMINOPHEN (PERCOCET) 5-325 MG TABLET oxyCODONE-acetaminophen (PERCOCET) 5-325 MG tablet      Take 1 tablet by mouth every 6 (six) hours as needed for moderate pain or severe pain.    Take 1 tablet by mouth every 6 (six) hours as needed for moderate pain or severe pain.  Discontinued Medications   No medications on file   ----------------------------------------------------------------------------------------------------------------------  Follow-up: Return in about 2 months (around 07/01/2023) for evaluation, med refill.    Yevette Edwards, MD

## 2023-04-30 NOTE — Patient Instructions (Signed)

## 2023-04-30 NOTE — Progress Notes (Unsigned)
Nursing Pain Medication Assessment:  Safety precautions to be maintained throughout the outpatient stay will include: orient to surroundings, keep bed in low position, maintain call bell within reach at all times, provide assistance with transfer out of bed and ambulation.  Medication Inspection Compliance: Pill count conducted under aseptic conditions, in front of the patient. Neither the pills nor the bottle was removed from the patient's sight at any time. Once count was completed pills were immediately returned to the patient in their original bottle.  Medication: Oxycodone/APAP Pill/Patch Count:  40 of 120 pills remain Pill/Patch Appearance: Markings consistent with prescribed medication Bottle Appearance: Standard pharmacy container. Clearly labeled. Filled Date: 6 / 35 / 2024 Last Medication intake:  Today

## 2023-05-02 LAB — TOXASSURE SELECT 13 (MW), URINE

## 2023-05-20 ENCOUNTER — Emergency Department: Payer: Medicare HMO

## 2023-05-20 ENCOUNTER — Other Ambulatory Visit: Payer: Self-pay

## 2023-05-20 ENCOUNTER — Emergency Department
Admission: EM | Admit: 2023-05-20 | Discharge: 2023-05-20 | Disposition: A | Discharge status: Home / Self Care | Payer: Medicare HMO | Attending: Emergency Medicine | Admitting: Emergency Medicine

## 2023-05-20 ENCOUNTER — Encounter: Payer: Self-pay | Admitting: Emergency Medicine

## 2023-05-20 DIAGNOSIS — S20309A Unspecified superficial injuries of unspecified front wall of thorax, initial encounter: Secondary | ICD-10-CM | POA: Diagnosis present

## 2023-05-20 DIAGNOSIS — J4530 Mild persistent asthma, uncomplicated: Secondary | ICD-10-CM | POA: Diagnosis not present

## 2023-05-20 DIAGNOSIS — W010XXA Fall on same level from slipping, tripping and stumbling without subsequent striking against object, initial encounter: Secondary | ICD-10-CM | POA: Diagnosis not present

## 2023-05-20 DIAGNOSIS — S2242XA Multiple fractures of ribs, left side, initial encounter for closed fracture: Secondary | ICD-10-CM | POA: Diagnosis not present

## 2023-05-20 MED ORDER — ACETAMINOPHEN 325 MG PO TABS
650.0000 mg | ORAL_TABLET | Freq: Once | ORAL | Status: AC
  Administered 2023-05-20: 650 mg via ORAL
  Filled 2023-05-20: qty 2

## 2023-05-20 MED ORDER — KETOROLAC TROMETHAMINE 15 MG/ML IJ SOLN
15.0000 mg | Freq: Once | INTRAMUSCULAR | Status: AC
  Administered 2023-05-20: 15 mg via INTRAMUSCULAR
  Filled 2023-05-20: qty 1

## 2023-05-20 MED ORDER — LIDOCAINE 5 % EX PTCH
1.0000 | MEDICATED_PATCH | CUTANEOUS | Status: DC
  Administered 2023-05-20: 1 via TRANSDERMAL
  Filled 2023-05-20: qty 1

## 2023-05-20 MED ORDER — OXYCODONE-ACETAMINOPHEN 5-325 MG PO TABS: 1.0000 | ORAL_TABLET | Freq: Three times a day (TID) | ORAL | 0 refills | Status: DC | PRN

## 2023-05-20 NOTE — ED Triage Notes (Addendum)
Pt here after a fall a week ago. Pt having sharp pain behind her left breast, pt believes her left rib may be injured. Pt ambulatory to triage.

## 2023-05-20 NOTE — ED Provider Notes (Signed)
[facility] Provider Note    Event Date/Time   First MD Initiated Contact with Patient 05/20/23 [phone]     (approximate)   History   Rib Injury   HPI  Tami Hopkins is a 62 y.o. female who presents today for evaluation of left anterior and lateral chest wall pain.  Patient reports that she tripped over a weight approximately 9 days ago and struck her left chest wall on the way down.  She is unsure what she struck on the way down.  She denies head strike or LOC.  She reports that she has pain to her anterior chest ever since.  She has pain specifically with movement, and has to hold the area to help it feel better.  She has not had any trouble breathing or walking.  She denies pain previous to the fall.  She denies blood thinners.  She believes that she broke her rib.  Patient Active Problem List   Diagnosis Date Noted   Pain in joint, shoulder region 12/28/2021   Unspecified inflammatory spondylopathy, lumbosacral region (HCC) 01/19/2019   DDD (degenerative disc disease), lumbar 11/26/2018   Cervicalgia 11/26/2018   Bad taste in mouth 06/26/2018   Chronic pain syndrome 01/29/2018   Chronic nausea 11/19/2017   Gastroesophageal reflux disease with esophagitis 05/17/2017   Trigger finger of right hand 02/08/2017   Irritable bowel syndrome with both constipation and diarrhea 11/19/2016   Chronic allergic rhinitis due to animal hair and dander 10/08/2016   Chronic hip pain, right 10/08/2016   Chronic right-sided low back pain with right-sided sciatica 10/08/2016   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   High cholesterol 10/08/2016   Moderate persistent asthma without complication 10/08/2016   Overweight (BMI 25.0-29.9) 10/08/2016   Tobacco use disorder 10/08/2016   Neurosis, posttraumatic 02/28/2016   Endometriosis 08/18/2015   Chronic obstructive pulmonary disease (HCC) 07/18/2015   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) 07/18/2015   Post-traumatic  stress disorder 07/18/2015   Delirium, drug-induced 05/12/2015   Posttraumatic stress disorder 05/12/2015   Rhabdomyolysis 05/10/2015   Encephalopathy acute 05/10/2015          Physical Exam   Triage Vital Signs: ED Triage Vitals  Encounter Vitals Group     BP 05/20/23 0900 (!) 157/112     Systolic BP Percentile --      Diastolic BP Percentile --      Pulse Rate 05/20/23 0900 91     Resp 05/20/23 0900 18     Temp 05/20/23 0900 98 F (36.7 C)     Temp Source 05/20/23 0900 Oral     SpO2 05/20/23 0900 95 %     Weight 05/20/23 0901 184 lb 1.4 oz (83.5 kg)     Height 05/20/23 0901 5\' 8"  (1.727 m)     Head Circumference --      Peak Flow --      Pain Score 05/20/23 0900 10     Pain Loc --      Pain Education --      Exclude from Growth Chart --     Most recent vital signs: Vitals:   05/20/23 0900  BP: (!) 157/112  Pulse: 91  Resp: 18  Temp: 98 F (36.7 C)  SpO2: 95%    Physical Exam Vitals and nursing note reviewed.  Constitutional:      General: Awake and alert. No acute distress.    Appearance: Normal appearance. The patient is overweight.  HENT:  Head: Normocephalic and atraumatic.     Mouth: Mucous membranes are moist.  Eyes:     General: PERRL. Normal EOMs        Right eye: No discharge.        Left eye: No discharge.     Conjunctiva/sclera: Conjunctivae normal.  Cardiovascular:     Rate and Rhythm: Normal rate and regular rhythm.     Pulses: Normal pulses.  Pulmonary:     Effort: Pulmonary effort is normal. No respiratory distress.  Able to speak easily in complete sentences    Breath sounds: Normal breath sounds.  Left anterior chest wall tenderness without ecchymosis or crepitus Abdominal:     Abdomen is soft. There is no abdominal tenderness. No rebound or guarding. No distention. Musculoskeletal:        General: No swelling. Normal range of motion.     Cervical back: Normal range of motion and neck supple.  No vertebral tenderness Skin:     General: Skin is warm and dry.     Capillary Refill: Capillary refill takes less than 2 seconds.     Findings: No rash.  Neurological:     Mental Status: The patient is awake and alert.      ED Results / Procedures / Treatments   Labs (all labs ordered are listed, but only abnormal results are displayed) Labs Reviewed - No data to display   EKG     RADIOLOGY I independently reviewed and interpreted imaging and agree with radiologists findings.     PROCEDURES:  Critical Care performed:   Procedures   MEDICATIONS ORDERED IN ED: Medications  lidocaine (LIDODERM) 5 % 1 patch (1 patch Transdermal Patch Applied 05/20/23 0956)  ketorolac (TORADOL) 15 MG/ML injection 15 mg (15 mg Intramuscular Given 05/20/23 0956)  acetaminophen (TYLENOL) tablet 650 mg (650 mg Oral Given 05/20/23 0955)     IMPRESSION / MDM / ASSESSMENT AND PLAN / ED COURSE  I reviewed the triage vital signs and the nursing notes.   Differential diagnosis includes, but is not limited to, rib fracture, pneumothorax, costochondritis, rib contusion.  Patient is awake and alert, hemodynamically stable and afebrile.  She has normal oxygen saturation on room air.  Further workup is indicated.  Chest x-ray obtained reveals mildly displaced probably subacute left-sided rib fractures from fourth through seventh and probable eighth ribs. No pneumothorax.  Patient reports that this occurred 9-10 days ago.  There is no evidence of atelectasis or pneumonia at this time.  She was given Lidoderm patches, Toradol, and Tylenol for symptomatic management in the emergency department given that she is driving herself home.  Recommended close outpatient follow-up and return precautions.  We also discussed the importance of taking deep breaths to fully aerate her lungs and prevent atelectasis/pneumonia.  Patient understands and agrees with plan.  She was discharged in stable condition.  Patient was also seen and evaluated by Dr.  Cyril Loosen who agrees with assessment and plan.  Patient's presentation is most consistent with acute complicated illness / injury requiring diagnostic workup.      FINAL CLINICAL IMPRESSION(S) / ED DIAGNOSES   Final diagnoses:  Closed fracture of multiple ribs of left side, initial encounter     Rx / DC Orders   ED Discharge Orders          Ordered    oxyCODONE-acetaminophen (PERCOCET) 5-325 MG tablet  Every 8 hours PRN        05/20/23 1101  Note:  This document was prepared using Dragon voice recognition software and may include unintentional dictation errors.   Keturah Shavers 05/20/23 1528    Jene Every, MD 05/20/23 [phone]

## 2023-06-27 ENCOUNTER — Ambulatory Visit: Payer: Medicare HMO | Admitting: Anesthesiology

## 2023-06-28 ENCOUNTER — Encounter: Payer: Self-pay | Admitting: Anesthesiology

## 2023-06-28 ENCOUNTER — Ambulatory Visit: Payer: Medicare HMO | Attending: Anesthesiology | Admitting: Anesthesiology

## 2023-06-28 DIAGNOSIS — G894 Chronic pain syndrome: Secondary | ICD-10-CM

## 2023-06-28 DIAGNOSIS — Z79891 Long term (current) use of opiate analgesic: Secondary | ICD-10-CM

## 2023-06-28 DIAGNOSIS — F1190 Opioid use, unspecified, uncomplicated: Secondary | ICD-10-CM

## 2023-06-28 DIAGNOSIS — M25512 Pain in left shoulder: Secondary | ICD-10-CM

## 2023-06-28 DIAGNOSIS — Q675 Congenital deformity of spine: Secondary | ICD-10-CM

## 2023-06-28 DIAGNOSIS — M5136 Other intervertebral disc degeneration, lumbar region: Secondary | ICD-10-CM

## 2023-06-28 DIAGNOSIS — M542 Cervicalgia: Secondary | ICD-10-CM | POA: Diagnosis not present

## 2023-06-28 DIAGNOSIS — M5441 Lumbago with sciatica, right side: Secondary | ICD-10-CM

## 2023-06-28 DIAGNOSIS — G8929 Other chronic pain: Secondary | ICD-10-CM

## 2023-06-28 DIAGNOSIS — M5431 Sciatica, right side: Secondary | ICD-10-CM

## 2023-06-28 DIAGNOSIS — M549 Dorsalgia, unspecified: Secondary | ICD-10-CM

## 2023-06-28 DIAGNOSIS — M545 Low back pain, unspecified: Secondary | ICD-10-CM

## 2023-06-28 DIAGNOSIS — M25551 Pain in right hip: Secondary | ICD-10-CM

## 2023-06-28 MED ORDER — OXYCODONE-ACETAMINOPHEN 5-325 MG PO TABS: 1.0000 | ORAL_TABLET | Freq: Four times a day (QID) | ORAL | 0 refills | Status: DC | PRN

## 2023-07-01 ENCOUNTER — Encounter: Payer: Self-pay | Admitting: Anesthesiology

## 2023-07-01 NOTE — Progress Notes (Signed)
Virtual Visit via Telephone Note  I connected with Tami Hopkins on 07/01/23 at  9:15 AM EDT by telephone and verified that I am speaking with the correct person using two identifiers.  Location: Patient: Home Provider: [facility] control center   I discussed the limitations, risks, security and privacy concerns of performing an evaluation and management service by telephone and the availability of in person appointments. I also discussed with the patient that there may be a patient responsible charge related to this service. The patient expressed understanding and agreed to proceed.   History of Present Illness: I spoke with Tami Hopkins via telephone as we are unable link with the video portion conference.  She reports that her low back pain and periodic neck pain are stable with no recent changes.  Otherwise she is in her usual state of health.  She is doing well on her current medication management and this continues to give her good relief results.  No change in the quality characteristic and distribution of this pain is noted.  She is staying active trying to exercise.  The medications enable her to stay functional and do her daily activities and sleep better at night.  No change in bowel or bladder function are noted.  Review of systems: General: No fevers or chills Pulmonary: No shortness of breath or dyspnea Cardiac: No angina or palpitations or lightheadedness GI: No abdominal pain or constipation Psych: No depression    Observations/Objective:  Current Outpatient Medications:    baclofen (LIORESAL) 10 MG tablet, Take 1 tablet (10 mg total) by mouth 2 (two) times daily., Disp: 60 tablet, Rfl: 5   clonazePAM (KLONOPIN) 1 MG tablet, Take 1 mg by mouth at bedtime as needed., Disp: , Rfl:    diphenoxylate-atropine (LOMOTIL) 2.5-0.025 MG per tablet, Take 1 tablet by mouth 4 (four) times daily as needed for diarrhea or loose stools., Disp: , Rfl:    FLOVENT HFA 110 MCG/ACT inhaler,  Inhale 2 puffs into the lungs at bedtime. , Disp: , Rfl:    fluticasone (FLONASE) 50 MCG/ACT nasal spray, Place 1 spray into both nostrils 2 (two) times daily., Disp: , Rfl:    furosemide (LASIX) 40 MG tablet, Take 1 tablet by mouth as needed. , Disp: , Rfl:    gabapentin (NEURONTIN) 300 MG capsule, Take 1 capsule (300 mg total) by mouth 4 (four) times daily. TAKE 1 CAPSULE BY MOUTH 4 TIMES DAILY., Disp: 120 capsule, Rfl: 3   ipratropium (ATROVENT) 0.06 % nasal spray, Place into the nose., Disp: , Rfl:    KLOR-CON 10 10 MEQ tablet, Take 10 mEq by mouth as needed. , Disp: , Rfl: 5   montelukast (SINGULAIR) 10 MG tablet, Take 10 mg by mouth at bedtime., Disp: , Rfl:    mupirocin ointment (BACTROBAN) 2 %, APPLY 1 APPLICATION TOPICALLY DAILY. APPLY TO WOUND AT RIGHT EAR AND COVER WITH BANDAID UNTIL HEALED, Disp: 22 g, Rfl: 0   [START ON 08/07/2023] oxyCODONE-acetaminophen (PERCOCET) 5-325 MG tablet, Take 1 tablet by mouth every 6 (six) hours as needed for moderate pain or severe pain., Disp: 120 tablet, Rfl: 0   [START ON 07/08/2023] oxyCODONE-acetaminophen (PERCOCET) 5-325 MG tablet, Take 1 tablet by mouth every 6 (six) hours as needed for severe pain (rib fx pain)., Disp: 120 tablet, Rfl: 0   promethazine (PHENERGAN) 25 MG tablet, Take 50 mg by mouth as needed. , Disp: , Rfl:    QUEtiapine (SEROQUEL) 100 MG tablet, Take by mouth., Disp: , Rfl:  Past Medical History:  Diagnosis Date   Anxiety    Asthma    Bursitis of hip bilateral    Hyperlipidemia    PTSD (post-traumatic stress disorder)    Scoliosis      Assessment and Plan: 1. Chronic, continuous use of opioids   2. Chronic pain syndrome   3. Cervicalgia   4. Bilateral sciatica   5. Musculoskeletal back pain   6. Chronic hip pain, right   7. Pain in joint of left shoulder   8. Congenital scoliosis   9. DDD (degenerative disc disease), lumbar   10. Back pain at L4-L5 level   11. Chronic right-sided low back pain with right-sided  sciatica   Based on conversation it is appropriate to refill her medicines for the next 2 months.  These be dated for September 16 and October 16.  No other changes in her regimen will be initiated.  I encouraged her to continue with exercises including stretching strengthening as reviewed.  Will schedule her for follow-up in 2 months.  She is encouraged to continue follow-up with her primary care physicians for baseline medical care.  Follow Up Instructions:    I discussed the assessment and treatment plan with the patient. The patient was provided an opportunity to ask questions and all were answered. The patient agreed with the plan and demonstrated an understanding of the instructions.   The patient was advised to call back or seek an in-person evaluation if the symptoms worsen or if the condition fails to improve as anticipated.  I provided 30 minutes of non-face-to-face time during this encounter.   Yevette Edwards, MD

## 2023-07-04 ENCOUNTER — Encounter: Payer: Medicare HMO | Admitting: Dermatology

## 2023-07-09 ENCOUNTER — Encounter: Payer: Medicare HMO | Admitting: Dermatology

## 2023-07-29 ENCOUNTER — Encounter: Payer: Medicare HMO | Admitting: Dermatology

## 2023-08-19 ENCOUNTER — Encounter: Payer: Medicare HMO | Admitting: Dermatology

## 2023-08-26 ENCOUNTER — Other Ambulatory Visit: Payer: Self-pay | Admitting: Anesthesiology

## 2023-08-26 ENCOUNTER — Institutional Professional Consult (permissible substitution): Payer: Medicare HMO | Admitting: Pulmonary Disease

## 2023-09-02 ENCOUNTER — Other Ambulatory Visit (INDEPENDENT_AMBULATORY_CARE_PROVIDER_SITE_OTHER): Payer: Self-pay | Admitting: Nurse Practitioner

## 2023-09-02 ENCOUNTER — Encounter: Payer: Self-pay | Admitting: Anesthesiology

## 2023-09-02 ENCOUNTER — Ambulatory Visit: Payer: Medicare HMO | Attending: Anesthesiology | Admitting: Anesthesiology

## 2023-09-02 DIAGNOSIS — M5431 Sciatica, right side: Secondary | ICD-10-CM

## 2023-09-02 DIAGNOSIS — G8929 Other chronic pain: Secondary | ICD-10-CM

## 2023-09-02 DIAGNOSIS — M5432 Sciatica, left side: Secondary | ICD-10-CM

## 2023-09-02 DIAGNOSIS — M542 Cervicalgia: Secondary | ICD-10-CM | POA: Diagnosis not present

## 2023-09-02 DIAGNOSIS — I83813 Varicose veins of bilateral lower extremities with pain: Secondary | ICD-10-CM

## 2023-09-02 DIAGNOSIS — M25512 Pain in left shoulder: Secondary | ICD-10-CM

## 2023-09-02 DIAGNOSIS — G894 Chronic pain syndrome: Secondary | ICD-10-CM

## 2023-09-02 DIAGNOSIS — Q675 Congenital deformity of spine: Secondary | ICD-10-CM

## 2023-09-02 DIAGNOSIS — M25551 Pain in right hip: Secondary | ICD-10-CM

## 2023-09-02 DIAGNOSIS — Z79891 Long term (current) use of opiate analgesic: Secondary | ICD-10-CM

## 2023-09-02 DIAGNOSIS — M549 Dorsalgia, unspecified: Secondary | ICD-10-CM

## 2023-09-02 DIAGNOSIS — F1190 Opioid use, unspecified, uncomplicated: Secondary | ICD-10-CM

## 2023-09-02 MED ORDER — OXYCODONE-ACETAMINOPHEN 5-325 MG PO TABS: 1.0000 | ORAL_TABLET | Freq: Four times a day (QID) | ORAL | 0 refills | Status: DC | PRN

## 2023-09-02 MED ORDER — OXYCODONE-ACETAMINOPHEN 5-325 MG PO TABS: 1.0000 | ORAL_TABLET | Freq: Four times a day (QID) | ORAL | 0 refills | Status: AC | PRN

## 2023-09-02 MED ORDER — GABAPENTIN 300 MG PO CAPS: 300.0000 mg | ORAL_CAPSULE | Freq: Four times a day (QID) | ORAL | 3 refills | Status: DC

## 2023-09-02 NOTE — Progress Notes (Unsigned)
Virtual Visit via Telephone Note  I connected with Tami Hopkins on 09/02/23 at  2:45 PM EST by telephone and verified that I am speaking with the correct person using two identifiers.  Location: Patient: Home Provider: Pain control center   I discussed the limitations, risks, security and privacy concerns of performing an evaluation and management service by telephone and the availability of in person appointments. I also discussed with the patient that there may be a patient responsible charge related to this service. The patient expressed understanding and agreed to proceed.   History of Present Illness: I spoke with Tami Hopkins via telephone as we were unable link for the video portion of the conference.  She reports that she is recently suffering from a right lower lobe pneumonia and taking antibiotics for that.  She has been recumbent and less active and is having some worsening centralized low back pain a gnawing aching pain that is worse with standing but no associated changes in lower extremity strength function bowel or bladder function.  She still has some intermittent sciatica and her typical chronic arthritic type pain for which she takes her Percocet 5 mg tablets 4 times a day and this continues to work well for her.  She is also taking baclofen 5 mg 4 times a day and gabapentin 4 times a day to keep the pain under control and this continues to work well for her.  No other changes in the quality characteristic or distribution of her pain are noted.  She reports that her neck and shoulder pain are stable without recent change.  No changes in upper extremity strength function are noted either.  The medications continue to give her about 50 to 75% relief which is typical for her lasting about 4 to 6 hours before she has recurrence of a baseline pain that is debilitating and limiting.  With the medication she reports that she is doing well. Review of systems: General: No fevers or  chills Pulmonary: No shortness of breath or dyspnea Cardiac: No angina or palpitations or lightheadedness GI: No abdominal pain or constipation Psych: No depression     Observations/Objective:  Current Outpatient Medications:    baclofen (LIORESAL) 10 MG tablet, Take 1 tablet (10 mg total) by mouth 2 (two) times daily., Disp: 60 tablet, Rfl: 5   clonazePAM (KLONOPIN) 1 MG tablet, Take 1 mg by mouth at bedtime as needed., Disp: , Rfl:    diphenoxylate-atropine (LOMOTIL) 2.5-0.025 MG per tablet, Take 1 tablet by mouth 4 (four) times daily as needed for diarrhea or loose stools., Disp: , Rfl:    FLOVENT HFA 110 MCG/ACT inhaler, Inhale 2 puffs into the lungs at bedtime. , Disp: , Rfl:    fluticasone (FLONASE) 50 MCG/ACT nasal spray, Place 1 spray into both nostrils 2 (two) times daily., Disp: , Rfl:    furosemide (LASIX) 40 MG tablet, Take 1 tablet by mouth as needed. , Disp: , Rfl:    gabapentin (NEURONTIN) 300 MG capsule, Take 1 capsule (300 mg total) by mouth 4 (four) times daily. TAKE 1 CAPSULE BY MOUTH 4 TIMES DAILY., Disp: 120 capsule, Rfl: 3   ipratropium (ATROVENT) 0.06 % nasal spray, Place into the nose., Disp: , Rfl:    KLOR-CON 10 10 MEQ tablet, Take 10 mEq by mouth as needed. , Disp: , Rfl: 5   montelukast (SINGULAIR) 10 MG tablet, Take 10 mg by mouth at bedtime., Disp: , Rfl:    mupirocin ointment (BACTROBAN) 2 %, APPLY  1 APPLICATION TOPICALLY DAILY. APPLY TO WOUND AT RIGHT EAR AND COVER WITH BANDAID UNTIL HEALED, Disp: 22 g, Rfl: 0   [START ON 09/06/2023] oxyCODONE-acetaminophen (PERCOCET) 5-325 MG tablet, Take 1 tablet by mouth every 6 (six) hours as needed for moderate pain (pain score 4-6) or severe pain (pain score 7-10)., Disp: 120 tablet, Rfl: 0   [START ON 10/06/2023] oxyCODONE-acetaminophen (PERCOCET) 5-325 MG tablet, Take 1 tablet by mouth every 6 (six) hours as needed for severe pain (pain score 7-10) (rib fx pain)., Disp: 120 tablet, Rfl: 0   promethazine (PHENERGAN) 25  MG tablet, Take 50 mg by mouth as needed. , Disp: , Rfl:    QUEtiapine (SEROQUEL) 100 MG tablet, Take by mouth., Disp: , Rfl:    Past Medical History:  Diagnosis Date   Anxiety    Asthma    Bursitis of hip bilateral    Hyperlipidemia    PTSD (post-traumatic stress disorder)    Scoliosis    Assessment and Plan:  1. Chronic, continuous use of opioids   2. Chronic pain syndrome   3. Cervicalgia   4. Bilateral sciatica   5. Musculoskeletal back pain   6. Chronic hip pain, right   7. Pain in joint of left shoulder   8. Congenital scoliosis    Based on our conversation it is appropriate to refill her medicines.  I have reviewed the [facility] practitioner database information and it is appropriate.  Refills will be dated for November 15 and December 15.  I have encouraged her to try and increase her activity continue with stretching application of TENS unit and continue baclofen.  Refills will be generated for the after mentioned dates and I have encouraged her to continue follow-up with her primary care physician and fulfill her antibiotic coverage with return to clinic scheduled in 2 months. Follow Up Instructions:    I discussed the assessment and treatment plan with the patient. The patient was provided an opportunity to ask questions and all were answered. The patient agreed with the plan and demonstrated an understanding of the instructions.   The patient was advised to call back or seek an in-person evaluation if the symptoms worsen or if the condition fails to improve as anticipated.  I provided 30 minutes of non-face-to-face time during this encounter.   Yevette Edwards, MD

## 2023-09-03 NOTE — Telephone Encounter (Signed)
Already done

## 2023-09-05 ENCOUNTER — Encounter (INDEPENDENT_AMBULATORY_CARE_PROVIDER_SITE_OTHER): Payer: Medicare Other

## 2023-09-05 ENCOUNTER — Encounter (INDEPENDENT_AMBULATORY_CARE_PROVIDER_SITE_OTHER): Payer: Medicare Other | Admitting: Nurse Practitioner

## 2023-09-09 ENCOUNTER — Encounter: Payer: Medicare HMO | Admitting: Dermatology

## 2023-09-10 ENCOUNTER — Telehealth: Payer: Self-pay | Admitting: Anesthesiology

## 2023-09-10 NOTE — Telephone Encounter (Signed)
Patient wants to know if her baclofen can be changed to 5mg  2 times a day instead of 10mg . She is cutting the tablets in half and sometimes they just crumble. Please advise patient. She just had a visit with Dr [name] 09-02-23

## 2023-09-11 ENCOUNTER — Other Ambulatory Visit: Payer: Self-pay | Admitting: *Deleted

## 2023-09-12 ENCOUNTER — Institutional Professional Consult (permissible substitution): Payer: Medicare HMO | Admitting: Pulmonary Disease

## 2023-09-13 ENCOUNTER — Institutional Professional Consult (permissible substitution): Payer: Medicare HMO | Admitting: Pulmonary Disease

## 2023-09-21 MED ORDER — BACLOFEN 10 MG PO TABS: 10.0000 mg | ORAL_TABLET | Freq: Two times a day (BID) | ORAL | 5 refills | Status: DC

## 2023-10-07 ENCOUNTER — Institutional Professional Consult (permissible substitution): Payer: Medicare HMO | Admitting: Pulmonary Disease

## 2023-10-07 ENCOUNTER — Emergency Department: Payer: Medicare HMO

## 2023-10-07 ENCOUNTER — Encounter: Payer: Medicare HMO | Admitting: Dermatology

## 2023-10-07 ENCOUNTER — Other Ambulatory Visit: Payer: Self-pay

## 2023-10-07 ENCOUNTER — Inpatient Hospital Stay
Admission: EM | Admit: 2023-10-07 | Discharge: 2023-10-14 | DRG: 917 | Disposition: A | Discharge status: Home Health | Payer: Medicare HMO | Attending: Internal Medicine | Admitting: Internal Medicine

## 2023-10-07 DIAGNOSIS — M5431 Sciatica, right side: Secondary | ICD-10-CM | POA: Diagnosis present

## 2023-10-07 DIAGNOSIS — Z888 Allergy status to other drugs, medicaments and biological substances status: Secondary | ICD-10-CM

## 2023-10-07 DIAGNOSIS — T50901A Poisoning by unspecified drugs, medicaments and biological substances, accidental (unintentional), initial encounter: Principal | ICD-10-CM

## 2023-10-07 DIAGNOSIS — Z818 Family history of other mental and behavioral disorders: Secondary | ICD-10-CM

## 2023-10-07 DIAGNOSIS — J4489 Other specified chronic obstructive pulmonary disease: Secondary | ICD-10-CM | POA: Diagnosis present

## 2023-10-07 DIAGNOSIS — Z825 Family history of asthma and other chronic lower respiratory diseases: Secondary | ICD-10-CM

## 2023-10-07 DIAGNOSIS — F4310 Post-traumatic stress disorder, unspecified: Secondary | ICD-10-CM | POA: Diagnosis present

## 2023-10-07 DIAGNOSIS — Z7951 Long term (current) use of inhaled steroids: Secondary | ICD-10-CM

## 2023-10-07 DIAGNOSIS — E876 Hypokalemia: Secondary | ICD-10-CM | POA: Diagnosis present

## 2023-10-07 DIAGNOSIS — J449 Chronic obstructive pulmonary disease, unspecified: Secondary | ICD-10-CM | POA: Diagnosis not present

## 2023-10-07 DIAGNOSIS — M4125 Other idiopathic scoliosis, thoracolumbar region: Secondary | ICD-10-CM

## 2023-10-07 DIAGNOSIS — Z880 Allergy status to penicillin: Secondary | ICD-10-CM

## 2023-10-07 DIAGNOSIS — Z882 Allergy status to sulfonamides status: Secondary | ICD-10-CM

## 2023-10-07 DIAGNOSIS — M5126 Other intervertebral disc displacement, lumbar region: Secondary | ICD-10-CM | POA: Diagnosis present

## 2023-10-07 DIAGNOSIS — N281 Cyst of kidney, acquired: Secondary | ICD-10-CM | POA: Diagnosis present

## 2023-10-07 DIAGNOSIS — Z8249 Family history of ischemic heart disease and other diseases of the circulatory system: Secondary | ICD-10-CM

## 2023-10-07 DIAGNOSIS — R319 Hematuria, unspecified: Secondary | ICD-10-CM | POA: Diagnosis present

## 2023-10-07 DIAGNOSIS — G9341 Metabolic encephalopathy: Secondary | ICD-10-CM | POA: Diagnosis present

## 2023-10-07 DIAGNOSIS — Z90710 Acquired absence of both cervix and uterus: Secondary | ICD-10-CM

## 2023-10-07 DIAGNOSIS — R7401 Elevation of levels of liver transaminase levels: Secondary | ICD-10-CM | POA: Diagnosis present

## 2023-10-07 DIAGNOSIS — R809 Proteinuria, unspecified: Secondary | ICD-10-CM | POA: Diagnosis present

## 2023-10-07 DIAGNOSIS — E860 Dehydration: Secondary | ICD-10-CM | POA: Diagnosis present

## 2023-10-07 DIAGNOSIS — T50904A Poisoning by unspecified drugs, medicaments and biological substances, undetermined, initial encounter: Secondary | ICD-10-CM

## 2023-10-07 DIAGNOSIS — M5116 Intervertebral disc disorders with radiculopathy, lumbar region: Secondary | ICD-10-CM

## 2023-10-07 DIAGNOSIS — F17210 Nicotine dependence, cigarettes, uncomplicated: Secondary | ICD-10-CM | POA: Diagnosis present

## 2023-10-07 DIAGNOSIS — K219 Gastro-esophageal reflux disease without esophagitis: Secondary | ICD-10-CM | POA: Diagnosis present

## 2023-10-07 DIAGNOSIS — R000 Tachycardia, unspecified: Secondary | ICD-10-CM | POA: Diagnosis present

## 2023-10-07 DIAGNOSIS — J9601 Acute respiratory failure with hypoxia: Secondary | ICD-10-CM | POA: Diagnosis present

## 2023-10-07 DIAGNOSIS — M6282 Rhabdomyolysis: Secondary | ICD-10-CM | POA: Diagnosis present

## 2023-10-07 DIAGNOSIS — Z8261 Family history of arthritis: Secondary | ICD-10-CM

## 2023-10-07 DIAGNOSIS — M419 Scoliosis, unspecified: Secondary | ICD-10-CM | POA: Diagnosis present

## 2023-10-07 DIAGNOSIS — Z79899 Other long term (current) drug therapy: Secondary | ICD-10-CM

## 2023-10-07 DIAGNOSIS — Z20822 Contact with and (suspected) exposure to covid-19: Secondary | ICD-10-CM | POA: Diagnosis present

## 2023-10-07 DIAGNOSIS — Z803 Family history of malignant neoplasm of breast: Secondary | ICD-10-CM

## 2023-10-07 DIAGNOSIS — E785 Hyperlipidemia, unspecified: Secondary | ICD-10-CM | POA: Diagnosis present

## 2023-10-07 DIAGNOSIS — N179 Acute kidney failure, unspecified: Secondary | ICD-10-CM | POA: Diagnosis present

## 2023-10-07 DIAGNOSIS — Z833 Family history of diabetes mellitus: Secondary | ICD-10-CM

## 2023-10-07 DIAGNOSIS — M48061 Spinal stenosis, lumbar region without neurogenic claudication: Secondary | ICD-10-CM

## 2023-10-07 DIAGNOSIS — R4182 Altered mental status, unspecified: Secondary | ICD-10-CM | POA: Diagnosis present

## 2023-10-07 DIAGNOSIS — Z5941 Food insecurity: Secondary | ICD-10-CM

## 2023-10-07 DIAGNOSIS — Z79891 Long term (current) use of opiate analgesic: Secondary | ICD-10-CM

## 2023-10-07 DIAGNOSIS — J690 Pneumonitis due to inhalation of food and vomit: Secondary | ICD-10-CM

## 2023-10-07 DIAGNOSIS — G928 Other toxic encephalopathy: Secondary | ICD-10-CM | POA: Diagnosis present

## 2023-10-07 DIAGNOSIS — G894 Chronic pain syndrome: Secondary | ICD-10-CM | POA: Diagnosis present

## 2023-10-07 DIAGNOSIS — Z881 Allergy status to other antibiotic agents status: Secondary | ICD-10-CM

## 2023-10-07 DIAGNOSIS — Z9049 Acquired absence of other specified parts of digestive tract: Secondary | ICD-10-CM

## 2023-10-07 DIAGNOSIS — Z5982 Transportation insecurity: Secondary | ICD-10-CM

## 2023-10-07 LAB — CBC WITH DIFFERENTIAL/PLATELET
Abs Immature Granulocytes: 0.08 10*3/uL — ABNORMAL HIGH (ref 0.00–0.07)
Basophils Absolute: 0 10*3/uL (ref 0.0–0.1)
Basophils Relative: 0 %
Eosinophils Absolute: 0 10*3/uL (ref 0.0–0.5)
Eosinophils Relative: 0 %
HCT: 48.4 % — ABNORMAL HIGH (ref 36.0–46.0)
Hemoglobin: 16.4 g/dL — ABNORMAL HIGH (ref 12.0–15.0)
Immature Granulocytes: 1 %
Lymphocytes Relative: 7 %
Lymphs Abs: 1 10*3/uL (ref 0.7–4.0)
MCH: 31.7 pg (ref 26.0–34.0)
MCHC: 33.9 g/dL (ref 30.0–36.0)
MCV: 93.6 fL (ref 80.0–100.0)
Monocytes Absolute: 1.1 10*3/uL — ABNORMAL HIGH (ref 0.1–1.0)
Monocytes Relative: 7 %
Neutro Abs: 12.9 10*3/uL — ABNORMAL HIGH (ref 1.7–7.7)
Neutrophils Relative %: 85 %
Platelets: 279 10*3/uL (ref 150–400)
RBC: 5.17 MIL/uL — ABNORMAL HIGH (ref 3.87–5.11)
RDW: 13.6 % (ref 11.5–15.5)
WBC: 15.1 10*3/uL — ABNORMAL HIGH (ref 4.0–10.5)
nRBC: 0 % (ref 0.0–0.2)

## 2023-10-07 LAB — COMPREHENSIVE METABOLIC PANEL
ALT: 255 U/L — ABNORMAL HIGH (ref 0–44)
AST: 710 U/L — ABNORMAL HIGH (ref 15–41)
Albumin: 5 g/dL (ref 3.5–5.0)
Alkaline Phosphatase: 91 U/L (ref 38–126)
Anion gap: 17 — ABNORMAL HIGH (ref 5–15)
BUN: 24 mg/dL — ABNORMAL HIGH (ref 8–23)
CO2: 20 mmol/L — ABNORMAL LOW (ref 22–32)
Calcium: 9.8 mg/dL (ref 8.9–10.3)
Chloride: 98 mmol/L (ref 98–111)
Creatinine, Ser: 1.87 mg/dL — ABNORMAL HIGH (ref 0.44–1.00)
GFR, Estimated: 30 mL/min — ABNORMAL LOW (ref >60)
Glucose, Bld: 155 mg/dL — ABNORMAL HIGH (ref 70–99)
Potassium: 3.1 mmol/L — ABNORMAL LOW (ref 3.5–5.1)
Sodium: 135 mmol/L (ref 135–145)
Total Bilirubin: 1 mg/dL (ref <1.2)
Total Protein: 8.4 g/dL — ABNORMAL HIGH (ref 6.5–8.1)

## 2023-10-07 LAB — URINALYSIS, ROUTINE W REFLEX MICROSCOPIC
Bilirubin Urine: NEGATIVE
Glucose, UA: NEGATIVE mg/dL
Ketones, ur: NEGATIVE mg/dL
Leukocytes,Ua: NEGATIVE
Nitrite: NEGATIVE
Protein, ur: 100 mg/dL — AB
Specific Gravity, Urine: 1.028 (ref 1.005–1.030)
Squamous Epithelial / HPF: 0 /[HPF] (ref 0–5)
WBC, UA: >50 WBC/hpf (ref 0–5)
pH: 5 (ref 5.0–8.0)

## 2023-10-07 LAB — RESP PANEL BY RT-PCR (RSV, FLU A&B, COVID)  RVPGX2
Influenza A by PCR: NEGATIVE
Influenza B by PCR: NEGATIVE
Resp Syncytial Virus by PCR: NEGATIVE
SARS Coronavirus 2 by RT PCR: NEGATIVE

## 2023-10-07 LAB — URINE DRUG SCREEN, QUALITATIVE (ARMC ONLY)
Amphetamines, Ur Screen: NOT DETECTED
Barbiturates, Ur Screen: NOT DETECTED
Benzodiazepine, Ur Scrn: POSITIVE — AB
Cannabinoid 50 Ng, Ur ~~LOC~~: NOT DETECTED
Cocaine Metabolite,Ur ~~LOC~~: NOT DETECTED
MDMA (Ecstasy)Ur Screen: NOT DETECTED
Methadone Scn, Ur: NOT DETECTED
Opiate, Ur Screen: POSITIVE — AB
Phencyclidine (PCP) Ur S: NOT DETECTED
Tricyclic, Ur Screen: POSITIVE — AB

## 2023-10-07 LAB — BLOOD GAS, VENOUS

## 2023-10-07 LAB — ETHANOL: Alcohol, Ethyl (B): <10 mg/dL (ref <10)

## 2023-10-07 LAB — LACTIC ACID, PLASMA
Lactic Acid, Venous: 3 mmol/L (ref 0.5–1.9)
Lactic Acid, Venous: 3.2 mmol/L (ref 0.5–1.9)

## 2023-10-07 LAB — MAGNESIUM: Magnesium: 2.1 mg/dL (ref 1.7–2.4)

## 2023-10-07 LAB — CK: Total CK: >50000 U/L — ABNORMAL HIGH (ref 38–234)

## 2023-10-07 MED ORDER — SODIUM CHLORIDE 0.9 % IV BOLUS
1000.0000 mL | Freq: Once | INTRAVENOUS | Status: AC
  Administered 2023-10-07: 1000 mL via INTRAVENOUS

## 2023-10-07 MED ORDER — SODIUM CHLORIDE 0.9 % IV BOLUS
500.0000 mL | Freq: Once | INTRAVENOUS | Status: AC
  Administered 2023-10-07: 500 mL via INTRAVENOUS

## 2023-10-07 MED ORDER — ENOXAPARIN SODIUM 40 MG/0.4ML IJ SOSY
40.0000 mg | PREFILLED_SYRINGE | Freq: Every day | INTRAMUSCULAR | Status: DC
  Administered 2023-10-07 – 2023-10-08 (×2): 40 mg via SUBCUTANEOUS
  Filled 2023-10-07 (×3): qty 0.4

## 2023-10-07 MED ORDER — ADULT MULTIVITAMIN W/MINERALS CH
1.0000 | ORAL_TABLET | Freq: Every day | ORAL | Status: DC
  Administered 2023-10-09 – 2023-10-13 (×5): 1 via ORAL
  Filled 2023-10-07 (×5): qty 1

## 2023-10-07 MED ORDER — POTASSIUM CHLORIDE 10 MEQ/100ML IV SOLN
10.0000 meq | INTRAVENOUS | Status: AC
  Administered 2023-10-07 – 2023-10-08 (×2): 10 meq via INTRAVENOUS
  Filled 2023-10-07 (×3): qty 100

## 2023-10-07 MED ORDER — LEVOFLOXACIN IN D5W 750 MG/150ML IV SOLN
750.0000 mg | Freq: Once | INTRAVENOUS | Status: AC
  Administered 2023-10-07: 750 mg via INTRAVENOUS
  Filled 2023-10-07: qty 150

## 2023-10-07 MED ORDER — LORAZEPAM 2 MG/ML IJ SOLN: 1.0000 mg | Freq: Once | INTRAMUSCULAR | Status: AC

## 2023-10-07 MED ORDER — ONDANSETRON HCL 4 MG/2ML IJ SOLN
4.0000 mg | Freq: Four times a day (QID) | INTRAMUSCULAR | Status: DC | PRN
Start: 2023-10-07 — End: 2023-10-14
  Administered 2023-10-08: 4 mg via INTRAVENOUS
  Filled 2023-10-07: qty 2

## 2023-10-07 MED ORDER — THIAMINE HCL 100 MG/ML IJ SOLN
100.0000 mg | Freq: Every day | INTRAMUSCULAR | Status: DC
  Administered 2023-10-08 – 2023-10-11 (×3): 100 mg via INTRAVENOUS
  Filled 2023-10-07 (×4): qty 2

## 2023-10-07 MED ORDER — LORAZEPAM 2 MG/ML IJ SOLN
INTRAMUSCULAR | Status: AC
  Administered 2023-10-07: 1 mg via INTRAVENOUS
  Filled 2023-10-07: qty 1

## 2023-10-07 MED ORDER — LACTATED RINGERS IV SOLN: INTRAVENOUS | Status: DC

## 2023-10-07 MED ORDER — THIAMINE MONONITRATE 100 MG PO TABS
100.0000 mg | ORAL_TABLET | Freq: Every day | ORAL | Status: DC
  Administered 2023-10-10 – 2023-10-14 (×4): 100 mg via ORAL
  Filled 2023-10-07 (×6): qty 1

## 2023-10-07 MED ORDER — ONDANSETRON HCL 4 MG PO TABS: 4.0000 mg | ORAL_TABLET | Freq: Four times a day (QID) | ORAL | Status: DC | PRN

## 2023-10-07 MED ORDER — ACETAMINOPHEN 650 MG RE SUPP: 650.0000 mg | Freq: Four times a day (QID) | RECTAL | Status: DC | PRN

## 2023-10-07 MED ORDER — IPRATROPIUM-ALBUTEROL 0.5-2.5 (3) MG/3ML IN SOLN
3.0000 mL | Freq: Four times a day (QID) | RESPIRATORY_TRACT | Status: DC
Start: 2023-10-07 — End: 2023-10-09
  Administered 2023-10-07 – 2023-10-09 (×8): 3 mL via RESPIRATORY_TRACT
  Filled 2023-10-07 (×7): qty 3

## 2023-10-07 MED ORDER — LEVOFLOXACIN IN D5W 750 MG/150ML IV SOLN
750.0000 mg | INTRAVENOUS | Status: DC
Start: 2023-10-09 — End: 2023-10-11

## 2023-10-07 MED ORDER — SODIUM CHLORIDE 0.9% FLUSH
3.0000 mL | Freq: Two times a day (BID) | INTRAVENOUS | Status: DC
  Administered 2023-10-09 – 2023-10-14 (×7): 3 mL via INTRAVENOUS

## 2023-10-07 MED ORDER — FOLIC ACID 1 MG PO TABS
1.0000 mg | ORAL_TABLET | Freq: Every day | ORAL | Status: DC
  Administered 2023-10-09 – 2023-10-14 (×6): 1 mg via ORAL
  Filled 2023-10-07 (×6): qty 1

## 2023-10-07 MED ORDER — SODIUM CHLORIDE 0.9 % IV SOLN: Freq: Once | INTRAVENOUS | Status: AC

## 2023-10-07 MED ORDER — ACETAMINOPHEN 325 MG PO TABS
650.0000 mg | ORAL_TABLET | Freq: Four times a day (QID) | ORAL | Status: DC | PRN
  Administered 2023-10-12 – 2023-10-13 (×2): 650 mg via ORAL
  Filled 2023-10-07 (×2): qty 2

## 2023-10-07 NOTE — Assessment & Plan Note (Signed)
Patient was noted to be hypoxic as low as 87%.  Likely multifactorial in the setting of encephalopathy and suspected aspiration pneumonia.  Given history of COPD, that may be contributing as well although no wheezing on examination.  - Continue supplemental oxygen to maintain oxygen saturation above 88% - Wean as tolerated

## 2023-10-07 NOTE — ED Provider Notes (Signed)
[facility] Provider Note    Event Date/Time   First MD Initiated Contact with Patient 10/07/23 1529     (approximate)   History   Altered Mental Status  Level V Caveat:  AMS  HPI  Tami Hopkins is a 62 y.o. female extensive past medical history including polysubstance abuse chronic pain syndrome rhabdo, encephalopathy, drug-induced delirium, PTSD, COPD presents to the ER for altered mental status possible overdose.  EMS was called by roommate.  EMS was not able to provide much additional history and patient is too encephalopathic to provide much additional history.  There is no report of any trauma.  Patient denies any and tensional overdose or intent for self-harm.  States that she is very thirsty     Physical Exam   Triage Vital Signs: ED Triage Vitals  Encounter Vitals Group     BP      Systolic BP Percentile      Diastolic BP Percentile      Pulse      Resp      Temp      Temp src      SpO2      Weight      Height      Head Circumference      Peak Flow      Pain Score      Pain Loc      Pain Education      Exclude from Growth Chart     Most recent vital signs: Vitals:   10/07/23 1845 10/07/23 1915  BP: (!) 124/94   Pulse: (!) 107   Resp: 15   Temp:  98.3 F (36.8 C)  SpO2: 94%      Constitutional: Drowsy but responds to voice. Eyes: Conjunctivae are normal.  Head: Atraumatic. Nose: No congestion/rhinnorhea. Mouth/Throat: Mucous membranes are dry Neck: Painless ROM.  Cardiovascular:   Good peripheral circulation. Respiratory: Mild tachypnea Gastrointestinal: Soft and nontender.  Musculoskeletal:  no deformity Neurologic:  MAE spontaneously. No gross focal neurologic deficits are appreciated.  No cogwheel rigidity Skin:  Skin is warm, dry and intact. No rash noted. Psychiatric: Mood and affect are normal. Speech and behavior are normal.    ED Results / Procedures / Treatments   Labs (all labs ordered are  listed, but only abnormal results are displayed) Labs Reviewed  CBC WITH DIFFERENTIAL/PLATELET - Abnormal; Notable for the following components:      Result Value   WBC 15.1 (*)    RBC 5.17 (*)    Hemoglobin 16.4 (*)    HCT 48.4 (*)    Neutro Abs 12.9 (*)    Monocytes Absolute 1.1 (*)    Abs Immature Granulocytes 0.08 (*)    All other components within normal limits  COMPREHENSIVE METABOLIC PANEL - Abnormal; Notable for the following components:   Potassium 3.1 (*)    CO2 20 (*)    Glucose, Bld 155 (*)    BUN 24 (*)    Creatinine, Ser 1.87 (*)    Total Protein 8.4 (*)    AST 710 (*)    ALT 255 (*)    GFR, Estimated 30 (*)    Anion gap 17 (*)    All other components within normal limits  URINALYSIS, ROUTINE W REFLEX MICROSCOPIC - Abnormal; Notable for the following components:   Color, Urine AMBER (*)    APPearance CLOUDY (*)    Hgb urine dipstick LARGE (*)    Protein, ur  100 (*)    Bacteria, UA MANY (*)    All other components within normal limits  URINE DRUG SCREEN, QUALITATIVE ([facility] ONLY) - Abnormal; Notable for the following components:   Tricyclic, Ur Screen POSITIVE (*)    Opiate, Ur Screen POSITIVE (*)    Benzodiazepine, Ur Scrn POSITIVE (*)    All other components within normal limits  BLOOD GAS, VENOUS - Abnormal; Notable for the following components:   pCO2, Ven 38 (*)    Acid-base deficit 2.9 (*)    All other components within normal limits  CK - Abnormal; Notable for the following components:   Total CK >50,000 (*)    All other components within normal limits  LACTIC ACID, PLASMA - Abnormal; Notable for the following components:   Lactic Acid, Venous 3.0 (*)    All other components within normal limits  RESP PANEL BY RT-PCR (RSV, FLU A&B, COVID)  [id number]  URINE CULTURE  CULTURE, BLOOD (ROUTINE X 2)  CULTURE, BLOOD (ROUTINE X 2)  MAGNESIUM  ETHANOL  LACTIC ACID, PLASMA     EKG  ED ECG REPORT I, Willy Eddy, the attending physician, personally  viewed and interpreted this ECG.   Date: 10/07/2023  EKG Time: 15:56  Rate: 115  Rhythm: sinus  Axis: normal  Intervals: polonged qt  ST&T Change: nonspecififc st     RADIOLOGY Please see ED Course for my review and interpretation.  I personally reviewed all radiographic images ordered to evaluate for the above acute complaints and reviewed radiology reports and findings.  These findings were personally discussed with the patient.  Please see medical record for radiology report.    PROCEDURES:  Critical Care performed: Yes, see critical care procedure note(s)  .Critical Care  Performed by: Willy Eddy, MD Authorized by: Willy Eddy, MD   Critical care provider statement:    Critical care time (minutes):  45   Critical care was necessary to treat or prevent imminent or life-threatening deterioration of the following conditions:  Circulatory failure and metabolic crisis   Critical care was time spent personally by me on the following activities:  Ordering and performing treatments and interventions, ordering and review of laboratory studies, ordering and review of radiographic studies, pulse oximetry, re-evaluation of patient's condition, review of old charts, obtaining history from patient or surrogate, examination of patient, evaluation of patient's response to treatment, discussions with primary provider, discussions with consultants and development of treatment plan with patient or surrogate    MEDICATIONS ORDERED IN ED: Medications  0.9 %  sodium chloride infusion (has no administration in time range)  sodium chloride 0.9 % bolus 1,000 mL (has no administration in time range)  sodium chloride 0.9 % bolus 1,000 mL (0 mLs Intravenous Stopped 10/07/23 1816)  levofloxacin (LEVAQUIN) IVPB 750 mg (750 mg Intravenous New Bag/Given 10/07/23 1826)  sodium chloride 0.9 % bolus 500 mL (500 mLs Intravenous New Bag/Given 10/07/23 1850)     IMPRESSION / MDM / ASSESSMENT  AND PLAN / ED COURSE  I reviewed the triage vital signs and the nursing notes.                              Differential diagnosis includes, but is not limited to, Dehydration, sepsis, pna, uti, hypoglycemia, cva, drug effect, withdrawal, encephalitis  Patient presenting to the ER for evaluation of symptoms as described above.  Based on symptoms, risk factors and considered above differential, this presenting complaint could  reflect a potentially life-threatening illness therefore the patient will be placed on continuous pulse oximetry and telemetry for monitoring.  Laboratory evaluation will be sent to evaluate for the above complaints.      Clinical Course as of 10/07/23 2015  Mon Oct 07, 2023  1743 UA consistent with infection.  Still awaiting metabolic panel lactate and other chemistry results. [PR]  1835 Patient with significant rhabdo CK greater than 50,000.  She is receiving IV fluids.  Will consult hospitalist for admission. [PR]  1941 Patient's not exhibiting any rigidity that would suggest NMS or serotonin syndrome.  Family are bedside does report patient with history of frequent overdoses.  Question whether she had episode where she had prolonged downtime that would have explained the rhabdo.  Patient receiving IV fluids has received IV antibiotics for sepsis secondary to UTI.  Will consult hospitalist for admission. [PR]    Clinical Course User Index [PR] Willy Eddy, MD     FINAL CLINICAL IMPRESSION(S) / ED DIAGNOSES   Final diagnoses:  Altered mental status, unspecified altered mental status type  Non-traumatic rhabdomyolysis     Rx / DC Orders   ED Discharge Orders     None        Note:  This document was prepared using Dragon voice recognition software and may include unintentional dictation errors.    Willy Eddy, MD 10/07/23 2015

## 2023-10-07 NOTE — Consult Note (Signed)
ED Pharmacy Antibiotic Sign Off An antibiotic consult was received from an ED provider for levofloxacin per pharmacy dosing for UTI. A chart review was completed to assess appropriateness.   Allergy review shows anaphylaxis with penicillins. No history of receipt of cephalosporins w/in our health system  The following one time order(s) were placed:  --Levofloxacin 750 mg IV x 1  Further antibiotic and/or antibiotic pharmacy consults should be ordered by the admitting provider if indicated.   Thank you for allowing pharmacy to be a part of this patient's care.   [name], [facility]  10/07/23 5:54 PM

## 2023-10-07 NOTE — Consult Note (Signed)
PHARMACY CONSULT NOTE - ELECTROLYTES  Pharmacy Consult for Electrolyte Monitoring and Replacement   Recent Labs: Potassium (mmol/L)  Date Value  10/07/2023 3.1 (L)   Magnesium (mg/dL)  Date Value  16/07/9603 2.1   Calcium (mg/dL)  Date Value  54/06/8118 9.8   Albumin (g/dL)  Date Value  14/78/2956 5.0   Sodium (mmol/L)  Date Value  10/07/2023 135    Weight: 91 kg (200 lb 9.6 oz) Estimated Creatinine Clearance: 35.8 mL/min (A) (by C-G formula based on SCr of 1.87 mg/dL (H)).  Assessment  Tami Hopkins is a 62 y.o. female presenting with AMS. PMH significant for chronic pain syndrome (on Percocet, baclofen, and gabapentin), PTSD (on Klonopin), GERD, IBS, hyperlipidemia . Pharmacy has been consulted to monitor and replace electrolytes.  AKI on admission in setting of rhabdomyolysis (CK > 50k)  Diet: NPO, aspiration precautions MIVF: LR @ 150 mL/hr Pertinent medications: N/A  Goal of Therapy: Electrolytes within normal limits  Plan:  K 3.1, patient is at risk for hyperkalemia given rhabdomyolysis but currently low. Will give Kcl 10 mEq IV q1h x 2 doses tonight and re-check labs in AM  Thank you for allowing pharmacy to be a part of this patient's care.  [name] 10/07/2023 9:42 PM

## 2023-10-07 NOTE — Assessment & Plan Note (Signed)
In the setting of dehydration and rhabdomyolysis.  - IV fluids as ordered - Hold home nephrotoxic agents - Strict in and out - Repeat BMP in the a.m.

## 2023-10-07 NOTE — Assessment & Plan Note (Signed)
Patient presenting with suspected drug overdose, unclear if intentional or unintentional.  She is on multiple centrally acting medications at home including gabapentin, baclofen, Percocet, Klonopin, Buspar and Seroquel (all with recent refills).  UDS also positive for tricyclic antidepressants. With current home regimen, she was at extremely high risk for an adverse event such as this.   - Telemetry monitoring - N.p.o. - IV fluids as ordered - Hold home regimen - Monitor closely for withdrawal from Percocet, baclofen and Klonopin

## 2023-10-07 NOTE — Assessment & Plan Note (Addendum)
-   Hold home Percocet, Gabapentin and Baclofen given lethargy and altered mental status - Monitor closely for evidence of withdrawal

## 2023-10-07 NOTE — Assessment & Plan Note (Addendum)
Chest x-ray notable for right lower lobe and possible right middle lobe opacity and in the setting of drug overdose, most likely aspiration related.  History of significant penicillin allergy and no prior history of cephalosporin trial.  - Levofloxacin per pharmacy dosing - Aspiration precautions

## 2023-10-07 NOTE — Assessment & Plan Note (Addendum)
Patient is currently on BuSpar, Klonopin, and prazosin.  - Hold home regimen in the setting of lethargy and altered mental status - CIWA monitoring given high risk for withdrawal

## 2023-10-07 NOTE — H&P (Signed)
History and Physical    Patient: Tami Hopkins DOB: Jul 22, 1961 DOA: 10/07/2023 DOS: the patient was seen and examined on 10/07/2023 PCP: Ardyth Man, PA-C  Patient coming from: Home  Chief Complaint:  Chief Complaint  Patient presents with   Altered Mental Status   HPI: Tami Hopkins is a 62 y.o. female with medical history significant of chronic pain syndrome (on Percocet, baclofen, and gabapentin), PTSD (on Klonopin), GERD, IBS, hyperlipidemia, who presents to the ED due to altered mental status.  History limited due to patient's altered mental status.  Her daughter at bedside is not able to provide additional history, but states that she has been worried about patient's medications, stating she often takes more than she is supposed to.  Per chart review, EMS was called after patient's roommate found her on the bathroom floor.  Unclear how long patient was down on the bathroom floor and she is not able to tell me what led to her being there.  She cannot tell me if she took extra doses of any of her medications.  ED course: On arrival to the ED, patient was normotensive at 126/92 with heart rate of 115.  She was saturating at 92% on room air but subsequently desaturated to 87% and was placed on 3 L with improvement.  She was tachycardic up to 115.  She was afebrile at 98.4.  Initial workup notable for VBG with pH of 7.37, pCO2 of 38.  CBC with WBC of 15.1, hemoglobin of 16.4.  CK elevated above 50,000.  CMP with potassium of 3.1, bicarb 20, anion gap 17, AST 710, ALT 255, creatinine 1.87 and GFR of 30.  Lactic acid 3.0.  UDS positive for benzos, opioids, and tricyclic's.  UA with hematuria but no RBC; many bacteria, calcium oxalate, and WBC clumps noted.  Alcohol level negative.  Chest x-ray with consolidation in the right lung base.  CT head negative.  CT renal stone study with no obstructing stones.  Patient started on IV fluids and levofloxacin.  TRH contacted for  admission.  Review of Systems: As mentioned in the history of present illness. All other systems reviewed and are negative.  Past Medical History:  Diagnosis Date   Anxiety    Asthma    Bursitis of hip bilateral    Hyperlipidemia    PTSD (post-traumatic stress disorder)    Scoliosis    Past Surgical History:  Procedure Laterality Date   ABDOMINAL HYSTERECTOMY     CESAREAN SECTION     CHOLECYSTECTOMY     ENDOMETRIAL ABLATION     x3   NOSE SURGERY     Social History:  reports that she has been smoking cigarettes. She started smoking about 28 years ago. She has a 7 pack-year smoking history. She has never used smokeless tobacco. She reports that she does not drink alcohol and does not use drugs.  Allergies  Allergen Reactions   Penicillins Anaphylaxis    Any "cillins"   Sulfa Antibiotics Anaphylaxis   Amitriptyline     Can not take with other medications  Can not take with other medications  Can not take with other medications    Bupropion     Can not take with other combination medications  Can not take with other combination medications  Can not take with other combination medications    Doxepin     Can not take with other medications  Can not take with other medications  Can not take with other  medications    Moxifloxacin Other (See Comments)    Family History  Problem Relation Age of Onset   Asthma Mother    Arthritis Mother    Depression Mother    Heart attack Father    Alcohol abuse Father    Hypertension Sister    Diabetes Sister    Obesity Sister    Breast cancer Sister 37       diagnosed a 2nd time metastic   Endometriosis Daughter    Endometriosis Daughter    Breast cancer Maternal Aunt        Great aunt   Heart failure Neg Hx     Prior to Admission medications   Medication Sig Start Date End Date Taking? Authorizing Provider  baclofen (LIORESAL) 10 MG tablet Take 1 tablet (10 mg total) by mouth 2 (two) times daily. 09/21/23 03/19/24  Yevette Edwards, MD  clonazePAM (KLONOPIN) 1 MG tablet Take 1 mg by mouth at bedtime as needed. 03/23/20   [provider]  diphenoxylate-atropine (LOMOTIL) 2.5-0.025 MG per tablet Take 1 tablet by mouth 4 (four) times daily as needed for diarrhea or loose stools.    [provider]  FLOVENT HFA 110 MCG/ACT inhaler Inhale 2 puffs into the lungs at bedtime.  11/26/15   [provider]  fluticasone (FLONASE) 50 MCG/ACT nasal spray Place 1 spray into both nostrils 2 (two) times daily.    [provider]  furosemide (LASIX) 40 MG tablet Take 1 tablet by mouth as needed.     [provider]  gabapentin (NEURONTIN) 300 MG capsule Take 1 capsule (300 mg total) by mouth 4 (four) times daily. TAKE 1 CAPSULE BY MOUTH 4 TIMES DAILY. 09/02/23 12/31/23  Yevette Edwards, MD  ipratropium (ATROVENT) 0.06 % nasal spray Place into the nose. 02/20/18 07/18/22  [provider]  KLOR-CON 10 10 MEQ tablet Take 10 mEq by mouth as needed.  07/02/15   [provider]  montelukast (SINGULAIR) 10 MG tablet Take 10 mg by mouth at bedtime.    [provider]  mupirocin ointment (BACTROBAN) 2 % APPLY 1 APPLICATION TOPICALLY DAILY. APPLY TO WOUND AT RIGHT EAR AND COVER WITH BANDAID UNTIL HEALED 01/24/23   Moye, [place], MD  oxyCODONE-acetaminophen (PERCOCET) 5-325 MG tablet Take 1 tablet by mouth every 6 (six) hours as needed for severe pain (pain score 7-10) (rib fx pain). 10/06/23 11/05/23  Yevette Edwards, MD  promethazine (PHENERGAN) 25 MG tablet Take 50 mg by mouth as needed.  08/16/15   [provider]  QUEtiapine (SEROQUEL) 100 MG tablet Take by mouth. 04/13/22   [provider]    Physical Exam: Vitals:   10/07/23 1730 10/07/23 1815 10/07/23 1845 10/07/23 1915  BP: (!) 151/107  (!) 124/94   Pulse: (!) 105 (!) 108 (!) 107   Resp: (!) 21 18 15    Temp:    98.3 F (36.8 C)  TempSrc:    Oral  SpO2: 92% 94% 94%    Physical Exam Vitals and nursing  note reviewed.  Constitutional:      Appearance: She is ill-appearing.  HENT:     Head: Normocephalic and atraumatic.     Mouth/Throat:     Comments: Very dry oropharynx Eyes:     General: No scleral icterus.    Pupils: Pupils are equal, round, and reactive to light.  Cardiovascular:     Rate and Rhythm: Regular rhythm. Tachycardia present.     Heart sounds: No  murmur heard. Pulmonary:     Effort: Pulmonary effort is normal. No respiratory distress.     Breath sounds: Normal breath sounds. No wheezing.  Abdominal:     General: Bowel sounds are normal. There is no distension.     Palpations: Abdomen is soft.     Tenderness: There is abdominal tenderness (Suprapubic). There is no guarding.  Musculoskeletal:     Right lower leg: No edema.     Left lower leg: No edema.  Skin:    General: Skin is warm and dry.     Comments: Bruising of the lateral right abdomen and right forearm  Neurological:     Mental Status: She is alert. She is confused.     Comments:  Patient is alert but disoriented.  Patient able to state where she is but not the circumstances that brought her here.  Moving all extremities against gravity.  No tremors.    Data Reviewed: CBC with WBC of 15.1, hemoglobin of 16.4, MCV of 93.6, platelets of 279 CMP with sodium of 135, potassium 3.1, bicarb 20, anion gap 17, glucose 155, BUN 24, creatinine 1.87, AST 710, ALT 255, and GFR of 30 CK above 50,000 Lactic acid 3.0 COVID-19, influenza and RSV PCR negative Urinalysis with large hematuria, proteinuria, many bacteria, calcium oxalate crystals, hyaline casts, mucus, and WBC clumps.  Minimal RBC/hpf Alcohol level negative UDS positive for benzos, opiates, tricyclic  EKG personally reviewed.  Sinus rhythm with rate of 115.  Left axis deviation.  No ST or T wave changes consistent with acute ischemia.  QT prolongation noted  CT Renal Stone Study Result Date: 10/07/2023 CLINICAL DATA:  Abdominal and flank pain EXAM: CT  ABDOMEN AND PELVIS WITHOUT CONTRAST TECHNIQUE: Multidetector CT imaging of the abdomen and pelvis was performed following the standard protocol without IV contrast. RADIATION DOSE REDUCTION: This exam was performed according to the departmental dose-optimization program which includes automated exposure control, adjustment of the mA and/or kV according to patient size and/or use of iterative reconstruction technique. COMPARISON:  CT abdomen and pelvis 04/08/2018 FINDINGS: Lower chest: No acute abnormality. Hepatobiliary: The liver is enlarged and there is diffuse fatty infiltration. Gallbladder is surgically absent. No biliary ductal dilatation. Pancreas: Unremarkable. No pancreatic ductal dilatation or surrounding inflammatory changes. Spleen: Normal in size without focal abnormality. Adrenals/Urinary Tract: There is a cyst in the superior pole of the left kidney measuring 19 mm. There are punctate right renal calculi. There is no hydronephrosis or perinephric fat stranding. The adrenal glands bladder are within normal limits. Stomach/Bowel: Stomach is within normal limits. Appendix appears normal. No evidence of bowel wall thickening, distention, or inflammatory changes. Vascular/Lymphatic: No significant vascular findings are present. No enlarged abdominal or pelvic lymph nodes. Reproductive: Status post hysterectomy. No adnexal masses. Other: No abdominal wall hernia or abnormality. No abdominopelvic ascites. Musculoskeletal: There is scoliosis and degenerative change of the thoracolumbar spine. IMPRESSION: 1. No acute localizing process in the abdomen or pelvis. 2. Hepatomegaly with fatty infiltration of the liver. 3. Nonobstructing right renal calculi. 4. Left Bosniak I benign renal cyst measuring 1.9 cm. No follow-up imaging is recommended. JACR 2018 Feb; 264-273, Management of the Incidental Renal Mass on CT, [employer] 2021; 814-848, Bosniak Classification of Cystic Renal Masses, Version 2019.  Electronically Signed   By: Darliss Cheney M.D.   On: 10/07/2023 20:31   CT HEAD WO CONTRAST ( ) Result Date: 10/07/2023 CLINICAL DATA:  Altered mental status EXAM: CT HEAD WITHOUT CONTRAST TECHNIQUE: Contiguous axial images were  obtained from the base of the skull through the vertex without intravenous contrast. RADIATION DOSE REDUCTION: This exam was performed according to the departmental dose-optimization program which includes automated exposure control, adjustment of the mA and/or kV according to patient size and/or use of iterative reconstruction technique. COMPARISON:  05/10/2015 FINDINGS: Brain: No evidence of acute infarction, hemorrhage, mass, mass effect, or midline shift. No hydrocephalus or extra-axial fluid collection. Small calcified lesion along the left posterior fossa appears stable from the prior exam, most likely a small meningioma (series 3, image 9). No evidence of significant mass effect. Vascular: No hyperdense vessel. Skull: Negative for fracture or focal lesion. Sinuses/Orbits: No acute finding. Other: The mastoid air cells are well aerated. IMPRESSION: No acute intracranial process. Electronically Signed   By: Wiliam Ke M.D.   On: 10/07/2023 18:07   DG Chest Portable 1 View Result Date: 10/07/2023 CLINICAL DATA:  Altered mental status. EXAM: PORTABLE CHEST 1 VIEW COMPARISON:  05/20/2023 FINDINGS: Abnormal consolidation/airspace opacity at the right lung base potentially in the right lower lobe a right middle lobe. The left lung appears clear. Levoconvex thoracic scoliosis and spondylosis. Heart size within normal limits. The patient is rotated to the right on today's radiograph, reducing diagnostic sensitivity and specificity. IMPRESSION: 1. Abnormal consolidation/airspace opacity at the right lung base potentially in the right lower lobe or right middle lobe. This is suspicious for pneumonia. Followup PA and lateral chest X-ray is recommended in 3-4 weeks following trial of  antibiotic therapy to ensure resolution and exclude underlying malignancy. 2. Levoconvex thoracic scoliosis and spondylosis. Electronically Signed   By: Gaylyn Rong M.D.   On: 10/07/2023 17:12   Results are pending, will review when available.  Assessment and Plan:  * Drug overdose Patient presenting with suspected drug overdose, unclear if intentional or unintentional.  She is on multiple centrally acting medications at home including gabapentin, baclofen, Percocet, Klonopin, Buspar and Seroquel (all with recent refills).  UDS also positive for tricyclic antidepressants. With current home regimen, she was at extremely high risk for an adverse event such as this.   - Telemetry monitoring - N.p.o. - IV fluids as ordered - Hold home regimen - Monitor closely for withdrawal from Percocet, baclofen and Klonopin  Rhabdomyolysis In the setting of unknown downtime.  - IV fluids as ordered - Recheck CK in the a.m.  Acute respiratory failure with hypoxia (HCC) Patient was noted to be hypoxic as low as 87%.  Likely multifactorial in the setting of encephalopathy and suspected aspiration pneumonia.  Given history of COPD, that may be contributing as well although no wheezing on examination.  - Continue supplemental oxygen to maintain oxygen saturation above 88% - Wean as tolerated  Aspiration pneumonia (HCC) Chest x-ray notable for right lower lobe and possible right middle lobe opacity and in the setting of drug overdose, most likely aspiration related.  History of significant penicillin allergy and no prior history of cephalosporin trial.  - Levofloxacin per pharmacy dosing - Aspiration precautions  AKI (acute kidney injury) (HCC) In the setting of dehydration and rhabdomyolysis.  - IV fluids as ordered - Hold home nephrotoxic agents - Strict in and out - Repeat BMP in the a.m.  Chronic obstructive pulmonary disease (HCC) History of COPD at high risk of acute exacerbation in  the setting of aspiration pneumonia  - DuoNebs every 6 hours - Hold home bronchodilators - Hold off on steroids at this time  Chronic pain syndrome - Hold home Percocet, Gabapentin and Baclofen given  lethargy and altered mental status - Monitor closely for evidence of withdrawal  Post-traumatic stress disorder Patient is currently on BuSpar, Klonopin, and prazosin.  - Hold home regimen in the setting of lethargy and altered mental status - CIWA monitoring given high risk for withdrawal  Advance Care Planning:   Code Status: Full Code   Consults: None  Family Communication: Patient's daughter updated at bedside  Severity of Illness: The appropriate patient status for this patient is INPATIENT. Inpatient status is judged to be reasonable and necessary in order to provide the required intensity of service to ensure the patient's safety. The patient's presenting symptoms, physical exam findings, and initial radiographic and laboratory data in the context of their chronic comorbidities is felt to place them at high risk for further clinical deterioration. Furthermore, it is not anticipated that the patient will be medically stable for discharge from the hospital within 2 midnights of admission.   * I certify that at the point of admission it is my clinical judgment that the patient will require inpatient hospital care spanning beyond 2 midnights from the point of admission due to high intensity of service, high risk for further deterioration and high frequency of surveillance required.*  Author: Verdene Lennert, MD 10/07/2023 9:08 PM  For on call review [email or web address].

## 2023-10-07 NOTE — Assessment & Plan Note (Signed)
History of COPD at high risk of acute exacerbation in the setting of aspiration pneumonia  - DuoNebs every 6 hours - Hold home bronchodilators - Hold off on steroids at this time

## 2023-10-07 NOTE — ED Triage Notes (Signed)
Patient to ED via [facility] from home for c/o altered mental status. EMS called out by roommate for concerns of possibly taking too much medication. Pt found on bathroom floor, no apparent injuries. Pt oriented to name only.

## 2023-10-07 NOTE — Assessment & Plan Note (Signed)
In the setting of unknown downtime.  - IV fluids as ordered - Recheck CK in the a.m.

## 2023-10-07 NOTE — Progress Notes (Addendum)
Pharmacy Antibiotic Note  Tami Hopkins is a 62 y.o. female admitted on 10/07/2023 with UTI and aspiration pneumonia .  Pharmacy has been consulted for levofloxacin dosing. Patient has a noted allergy to penicillins with an anaphylaxis reaction.   Patient has already received levofloxacin 750 mg IV x 1 Lactic acid 3 WBC 15.1 Afebrile Scr 1.87, unclear of baseline 12/16 UA: negative leukocytes, many bacteria, WBC >50 12/16 CXR: Abnormal consolidation/airspace opacity at the right lung base potentially in the right lower lobe or right middle lobe. This is suspicious for pneumonia.   Plan: Start levofloxacin 750 mg IV Q48H  Continue to monitor renal function and follow culture results      Temp (24hrs), Avg:98.4 F (36.9 C), Min:98.3 F (36.8 C), Max:98.4 F (36.9 C)  Recent Labs  Lab 10/07/23 1552 10/07/23 1748  WBC 15.1*  --   CREATININE 1.87*  --   LATICACIDVEN  --  3.0*    Estimated Creatinine Clearance: 35.8 mL/min (A) (by C-G formula based on SCr of 1.87 mg/dL (H)).    Allergies  Allergen Reactions   Penicillins Anaphylaxis    Any "cillins"   Sulfa Antibiotics Anaphylaxis   Amitriptyline     Can not take with other medications  Can not take with other medications  Can not take with other medications    Bupropion     Can not take with other combination medications  Can not take with other combination medications  Can not take with other combination medications    Doxepin     Can not take with other medications  Can not take with other medications  Can not take with other medications    Moxifloxacin Other (See Comments)    Antimicrobials this admission: 12/16 levofloxacin >>    Dose adjustments this admission: None  Microbiology results: 12/16 BCx: IP 12/16 UCx: IP   Thank you for allowing pharmacy to be a part of this patient's care.  Merryl Hacker, PharmD Clinical Pharmacist 10/07/2023 8:50 PM

## 2023-10-07 NOTE — ED Notes (Signed)
Pt placed on 3L nasal cannula due to O2 87%. O2 currently 94% on 3L.

## 2023-10-08 ENCOUNTER — Inpatient Hospital Stay: Payer: Medicare HMO

## 2023-10-08 DIAGNOSIS — T50904A Poisoning by unspecified drugs, medicaments and biological substances, undetermined, initial encounter: Secondary | ICD-10-CM | POA: Diagnosis not present

## 2023-10-08 LAB — CBC
HCT: 40.6 % (ref 36.0–46.0)
Hemoglobin: 13.7 g/dL (ref 12.0–15.0)
MCH: 31.9 pg (ref 26.0–34.0)
MCHC: 33.7 g/dL (ref 30.0–36.0)
MCV: 94.6 fL (ref 80.0–100.0)
Platelets: 223 10*3/uL (ref 150–400)
RBC: 4.29 MIL/uL (ref 3.87–5.11)
RDW: 13.7 % (ref 11.5–15.5)
WBC: 12 10*3/uL — ABNORMAL HIGH (ref 4.0–10.5)
nRBC: 0 % (ref 0.0–0.2)

## 2023-10-08 LAB — MAGNESIUM: Magnesium: 1.5 mg/dL — ABNORMAL LOW (ref 1.7–2.4)

## 2023-10-08 LAB — COMPREHENSIVE METABOLIC PANEL
ALT: 225 U/L — ABNORMAL HIGH (ref 0–44)
AST: 545 U/L — ABNORMAL HIGH (ref 15–41)
Albumin: 3.4 g/dL — ABNORMAL LOW (ref 3.5–5.0)
Alkaline Phosphatase: 63 U/L (ref 38–126)
Anion gap: 9 (ref 5–15)
BUN: 27 mg/dL — ABNORMAL HIGH (ref 8–23)
CO2: 21 mmol/L — ABNORMAL LOW (ref 22–32)
Calcium: 8 mg/dL — ABNORMAL LOW (ref 8.9–10.3)
Chloride: 106 mmol/L (ref 98–111)
Creatinine, Ser: 1.6 mg/dL — ABNORMAL HIGH (ref 0.44–1.00)
GFR, Estimated: 36 mL/min — ABNORMAL LOW (ref >60)
Glucose, Bld: 119 mg/dL — ABNORMAL HIGH (ref 70–99)
Potassium: 2.9 mmol/L — ABNORMAL LOW (ref 3.5–5.1)
Sodium: 136 mmol/L (ref 135–145)
Total Bilirubin: 0.9 mg/dL (ref <1.2)
Total Protein: 6 g/dL — ABNORMAL LOW (ref 6.5–8.1)

## 2023-10-08 LAB — LACTIC ACID, PLASMA: Lactic Acid, Venous: 1.5 mmol/L (ref 0.5–1.9)

## 2023-10-08 LAB — CK: Total CK: 31576 U/L — ABNORMAL HIGH (ref 38–234)

## 2023-10-08 LAB — HIV ANTIBODY (ROUTINE TESTING W REFLEX): HIV Screen 4th Generation wRfx: NONREACTIVE

## 2023-10-08 LAB — PHOSPHORUS: Phosphorus: 3.1 mg/dL (ref 2.5–4.6)

## 2023-10-08 MED ORDER — POTASSIUM CHLORIDE 10 MEQ/100ML IV SOLN
10.0000 meq | INTRAVENOUS | Status: AC
  Administered 2023-10-08 (×4): 10 meq via INTRAVENOUS
  Filled 2023-10-08 (×2): qty 100

## 2023-10-08 MED ORDER — SODIUM CHLORIDE 0.9 % IV SOLN: INTRAVENOUS | Status: AC

## 2023-10-08 MED ORDER — LORAZEPAM 2 MG/ML IJ SOLN
INTRAMUSCULAR | Status: AC
  Administered 2023-10-08: 2 mg via INTRAVENOUS
  Filled 2023-10-08: qty 1

## 2023-10-08 MED ORDER — MAGNESIUM SULFATE 2 GM/50ML IV SOLN
2.0000 g | Freq: Once | INTRAVENOUS | Status: AC
  Administered 2023-10-08: 2 g via INTRAVENOUS
  Filled 2023-10-08: qty 50

## 2023-10-08 MED ORDER — ACETAMINOPHEN 10 MG/ML IV SOLN
1000.0000 mg | Freq: Once | INTRAVENOUS | Status: AC
  Administered 2023-10-08: 1000 mg via INTRAVENOUS
  Filled 2023-10-08: qty 100

## 2023-10-08 MED ORDER — POTASSIUM CHLORIDE 10 MEQ/100ML IV SOLN
10.0000 meq | INTRAVENOUS | Status: AC
Start: 2023-10-08 — End: 2023-10-08
  Administered 2023-10-08 (×4): 10 meq via INTRAVENOUS
  Filled 2023-10-08 (×2): qty 100

## 2023-10-08 MED ORDER — LORAZEPAM 2 MG/ML IJ SOLN
1.0000 mg | INTRAMUSCULAR | Status: AC | PRN
  Administered 2023-10-08 (×4): 2 mg via INTRAVENOUS
  Administered 2023-10-08 (×2): 4 mg via INTRAVENOUS
  Administered 2023-10-08: 2 mg via INTRAVENOUS
  Administered 2023-10-08: 4 mg via INTRAVENOUS
  Administered 2023-10-09: 1 mg via INTRAVENOUS
  Filled 2023-10-08 (×2): qty 1
  Filled 2023-10-08: qty 2
  Filled 2023-10-08 (×6): qty 1
  Filled 2023-10-08: qty 2

## 2023-10-08 MED ORDER — MORPHINE SULFATE (PF) 2 MG/ML IV SOLN
INTRAVENOUS | Status: AC
  Administered 2023-10-08: 2 mg via INTRAVENOUS
  Filled 2023-10-08: qty 1

## 2023-10-08 MED ORDER — LORAZEPAM 1 MG PO TABS
1.0000 mg | ORAL_TABLET | ORAL | Status: AC | PRN
Start: 2023-10-08 — End: 2023-10-11
  Administered 2023-10-10 (×2): 1 mg via ORAL
  Filled 2023-10-08 (×2): qty 1

## 2023-10-08 MED ORDER — POTASSIUM CHLORIDE CRYS ER 20 MEQ PO TBCR: 40.0000 meq | EXTENDED_RELEASE_TABLET | Freq: Once | ORAL | Status: DC

## 2023-10-08 MED ORDER — MORPHINE SULFATE (PF) 2 MG/ML IV SOLN: 2.0000 mg | Freq: Four times a day (QID) | INTRAVENOUS | Status: DC | PRN

## 2023-10-08 NOTE — Consult Note (Signed)
Briefly spoke w/ pt. Pt able to tell me her name is "Tami Hopkins". When asked about her age and date of birth, continues to repeat "Tami Hopkins". Closes her eyes and does not respond to further assessment. Pt is unable to participate in assessment at this time. Psychiatry will re-attempt at a later time.

## 2023-10-08 NOTE — ED Notes (Signed)
Dr. Lucianne Muss notified pt[place] 2.9, [place] [address], and mag is 1.5.

## 2023-10-08 NOTE — Progress Notes (Signed)
[facility] Progress Note  Patient: Tami Hopkins    ZOX:[id number]  DOA: 10/07/2023     Date of Service: the patient was seen and examined on 10/08/2023  Chief Complaint  Patient presents with   Altered Mental Status   Brief hospital course: Tami Hopkins is a 62 y.o. female with medical history significant of chronic pain syndrome (on Percocet, baclofen, and gabapentin), PTSD (on Klonopin), GERD, IBS, hyperlipidemia, who presents to the ED due to altered mental status.   History limited due to patient's altered mental status.  Her daughter at bedside is not able to provide additional history, but states that she has been worried about patient's medications, stating she often takes more than she is supposed to.   Per chart review, EMS was called after patient's roommate found her on the bathroom floor.  Unclear how long patient was down on the bathroom floor and she is not able to tell me what led to her being there.  She cannot tell me if she took extra doses of any of her medications.   ED course: On arrival to the ED, patient was normotensive at 126/92 with heart rate of 115.  She was saturating at 92% on room air but subsequently desaturated to 87% and was placed on 3 L with improvement.  She was tachycardic up to 115.  She was afebrile at 98.4.  Initial workup notable for VBG with pH of 7.37, pCO2 of 38.  CBC with WBC of 15.1, hemoglobin of 16.4.  CK elevated above 50,000.  CMP with potassium of 3.1, bicarb 20, anion gap 17, AST 710, ALT 255, creatinine 1.87 and GFR of 30.  Lactic acid 3.0.  UDS positive for benzos, opioids, and tricyclic's.  UA with hematuria but no RBC; many bacteria, calcium oxalate, and WBC clumps noted.  Alcohol level negative.  Chest x-ray with consolidation in the right lung base.  CT head negative.  CT renal stone study with no obstructing stones.  Patient started on IV fluids and levofloxacin.  [facility] contacted for admission.   Assessment and Plan:  #  Drug overdose Patient presenting with suspected drug overdose, unclear if intentional or unintentional.  She is on multiple centrally acting medications at home including gabapentin, baclofen, Percocet, Klonopin, Buspar and Seroquel (all with recent refills).  UDS also positive for tricyclic antidepressants. With current home regimen, she was at extremely high risk for an adverse event such as this.    - Telemetry monitoring - N.p.o. - IV fluids as ordered - Hold home regimen - Monitor closely for withdrawal from Percocet, baclofen and Klonopin Follow psych consult  # Rhabdomyolysis In the setting of unknown downtime. CK 50K--31K trending down - IV fluids as ordered - Recheck CK in the a.m.   # Acute respiratory failure with hypoxia  Patient was noted to be hypoxic as low as 87%.  Likely multifactorial in the setting of encephalopathy and suspected aspiration pneumonia.  Given history of COPD, that may be contributing as well although no wheezing on examination.   - Continue supplemental oxygen to maintain oxygen saturation above 88% - Wean as tolerated   # Aspiration pneumonia (HCC) Chest x-ray notable for right lower lobe and possible right middle lobe opacity and in the setting of drug overdose, most likely aspiration related.  History of significant penicillin allergy and no prior history of cephalosporin trial.   - Levofloxacin per pharmacy dosing - Aspiration precautions   # AKI (acute kidney injury) (HCC) In the  setting of dehydration and rhabdomyolysis.   - IV fluids as ordered - Hold home nephrotoxic agents - Strict in and out - Repeat BMP in the a.m.   # Hypokalemia, potassium repleted. # Magnesium, mag repleted. Monitor electrolytes and replete as needed.   # Chronic obstructive pulmonary disease History of COPD at high risk of acute exacerbation in the setting of aspiration pneumonia   - DuoNebs every 6 hours - Hold home bronchodilators - Hold off on steroids  at this time   # Chronic pain syndrome - Hold home Percocet, Gabapentin and Baclofen given lethargy and altered mental status - Monitor closely for evidence of withdrawal   # Post-traumatic stress disorder Patient is currently on BuSpar, Klonopin, and prazosin.   - Hold home regimen in the setting of lethargy and altered mental status - CIWA monitoring given high risk for withdrawal   # Renal cyst, left.  Incidental finding  Left Bosniak I benign renal cyst measuring 1.9 cm. No follow-up imaging is recommended.   Body mass index is 30.5 kg/m.  Interventions:  Diet: NPO  DVT Prophylaxis: Subcutaneous Lovenox   Advance goals of care discussion: Full code  Family Communication: family was not present at bedside, at the time of interview.  Patient is oriented to herself only  and very confused   Disposition:  Pt is from Home, admitted with drug overdose, respiratory failure, rhabdomyolysis, still has altered mental status and electrolyte imbalance, respiratory failure, which precludes a safe discharge. Discharge to home, when stable, may need few days to improve.  Subjective: No significant events overnight, patient is oriented to herself only, laying comfortably, intermittently crying and unable to offer any complaints.  Patient said that she wants to go home, patient was very confused and sleepy.  Physical Exam: General: NAD, lying comfortably, sleepy, dozing off Appear in mild to moderate distress, affect depressed, intermittently crying Eyes: PERRLA ENT: Oral Mucosa Clear, moist  Neck: no JVD,  Cardiovascular: S1 and S2 Present, no Murmur,  Respiratory: Equal air entry bilaterally, mild crackles, minimal wheezing Abdomen: Bowel Sound present, Soft and no tenderness,  Skin: no rashes Extremities: no Pedal edema, no calf tenderness Neurologic: without any new focal findings Gait not checked due to patient safety concerns  Vitals:   10/08/23 0630 10/08/23 0632  10/08/23 0700 10/08/23 0715  BP: (!) 127/93  (!) 146/79 136/89  Pulse: 94  95 96  Resp: (!) 30  (!) 24   Temp:  99.2 F (37.3 C)    TempSrc:  Axillary    SpO2: 98%  96%   Weight:        Intake/Output Summary (Last 24 hours) at 10/08/2023 0756 Last data filed at 10/08/2023 0741 Gross per 24 hour  Intake 2894 ml  Output --  Net 2894 ml   Filed Weights   10/07/23 2123  Weight: 91 kg    Data Reviewed: I have personally reviewed and interpreted daily labs, tele strips, imagings as discussed above. I reviewed all nursing notes, pharmacy notes, vitals, pertinent old records I have discussed plan of care as described above with RN and patient/family.  CBC: Recent Labs  Lab 10/07/23 1552 10/08/23 0452  WBC 15.1* 12.0*  NEUTROABS 12.9*  --   HGB 16.4* 13.7  HCT 48.4* 40.6  MCV 93.6 94.6  PLT 279 223   Basic Metabolic Panel: Recent Labs  Lab 10/07/23 1552  NA 135  K 3.1*  CL 98  CO2 20*  GLUCOSE 155*  BUN 24*  CREATININE 1.87*  CALCIUM 9.8  MG 2.1    Studies: CT Renal Stone Study Result Date: 10/07/2023 CLINICAL DATA:  Abdominal and flank pain EXAM: CT ABDOMEN AND PELVIS WITHOUT CONTRAST TECHNIQUE: Multidetector CT imaging of the abdomen and pelvis was performed following the standard protocol without IV contrast. RADIATION DOSE REDUCTION: This exam was performed according to the departmental dose-optimization program which includes automated exposure control, adjustment of the mA and/or kV according to patient size and/or use of iterative reconstruction technique. COMPARISON:  CT abdomen and pelvis 04/08/2018 FINDINGS: Lower chest: No acute abnormality. Hepatobiliary: The liver is enlarged and there is diffuse fatty infiltration. Gallbladder is surgically absent. No biliary ductal dilatation. Pancreas: Unremarkable. No pancreatic ductal dilatation or surrounding inflammatory changes. Spleen: Normal in size without focal abnormality. Adrenals/Urinary Tract: There is a  cyst in the superior pole of the left kidney measuring 19 mm. There are punctate right renal calculi. There is no hydronephrosis or perinephric fat stranding. The adrenal glands bladder are within normal limits. Stomach/Bowel: Stomach is within normal limits. Appendix appears normal. No evidence of bowel wall thickening, distention, or inflammatory changes. Vascular/Lymphatic: No significant vascular findings are present. No enlarged abdominal or pelvic lymph nodes. Reproductive: Status post hysterectomy. No adnexal masses. Other: No abdominal wall hernia or abnormality. No abdominopelvic ascites. Musculoskeletal: There is scoliosis and degenerative change of the thoracolumbar spine. IMPRESSION: 1. No acute localizing process in the abdomen or pelvis. 2. Hepatomegaly with fatty infiltration of the liver. 3. Nonobstructing right renal calculi. 4. Left Bosniak I benign renal cyst measuring 1.9 cm. No follow-up imaging is recommended. JACR 2018 Feb; 264-273, Management of the Incidental Renal Mass on CT, [employer] 2021; 814-848, Bosniak Classification of Cystic Renal Masses, Version 2019. Electronically Signed   By: Darliss Cheney M.D.   On: 10/07/2023 20:31   CT HEAD WO CONTRAST ( ) Result Date: 10/07/2023 CLINICAL DATA:  Altered mental status EXAM: CT HEAD WITHOUT CONTRAST TECHNIQUE: Contiguous axial images were obtained from the base of the skull through the vertex without intravenous contrast. RADIATION DOSE REDUCTION: This exam was performed according to the departmental dose-optimization program which includes automated exposure control, adjustment of the mA and/or kV according to patient size and/or use of iterative reconstruction technique. COMPARISON:  05/10/2015 FINDINGS: Brain: No evidence of acute infarction, hemorrhage, mass, mass effect, or midline shift. No hydrocephalus or extra-axial fluid collection. Small calcified lesion along the left posterior fossa appears stable from the prior exam,  most likely a small meningioma (series 3, image 9). No evidence of significant mass effect. Vascular: No hyperdense vessel. Skull: Negative for fracture or focal lesion. Sinuses/Orbits: No acute finding. Other: The mastoid air cells are well aerated. IMPRESSION: No acute intracranial process. Electronically Signed   By: Wiliam Ke M.D.   On: 10/07/2023 18:07   DG Chest Portable 1 View Result Date: 10/07/2023 CLINICAL DATA:  Altered mental status. EXAM: PORTABLE CHEST 1 VIEW COMPARISON:  05/20/2023 FINDINGS: Abnormal consolidation/airspace opacity at the right lung base potentially in the right lower lobe a right middle lobe. The left lung appears clear. Levoconvex thoracic scoliosis and spondylosis. Heart size within normal limits. The patient is rotated to the right on today's radiograph, reducing diagnostic sensitivity and specificity. IMPRESSION: 1. Abnormal consolidation/airspace opacity at the right lung base potentially in the right lower lobe or right middle lobe. This is suspicious for pneumonia. Followup PA and lateral chest X-ray is recommended in 3-4 weeks following trial of antibiotic therapy to ensure resolution and exclude underlying malignancy.  2. Levoconvex thoracic scoliosis and spondylosis. Electronically Signed   By: Gaylyn Rong M.D.   On: 10/07/2023 17:12    Scheduled Meds:  enoxaparin (LOVENOX) injection  40 mg Subcutaneous QHS   folic acid  1 mg Oral Daily   ipratropium-albuterol  3 mL Nebulization Q6H   multivitamin with minerals  1 tablet Oral Daily   sodium chloride flush  3 mL Intravenous Q12H   thiamine  100 mg Oral Daily   Or   thiamine  100 mg Intravenous Daily   Continuous Infusions:  [START ON 10/09/2023] levofloxacin (LEVAQUIN) IV     PRN Meds: acetaminophen **OR** acetaminophen, LORazepam **OR** LORazepam, ondansetron **OR** ondansetron (ZOFRAN) IV  Time spent: 55 minutes  Author: Gillis Santa. MD [facility] 10/08/2023 7:56 AM  To reach  On-call, see care teams to locate the attending and reach out to them via [email or web address]. If 7PM-7AM, please contact night-coverage If you still have difficulty reaching the attending provider, please page the [facility] (Director on Call) for [facility] on amion for assistance.

## 2023-10-08 NOTE — ED Notes (Signed)
This RN notified Dr. Lucianne Muss that pt. Appears to be getting worse. Speech is more clear, yet more nonsensical. Catheter inserted, drained.

## 2023-10-08 NOTE — Consult Note (Signed)
PHARMACY CONSULT NOTE - ELECTROLYTES  Pharmacy Consult for Electrolyte Monitoring and Replacement   Recent Labs: Potassium (mmol/L)  Date Value  10/08/2023 2.9 (L)   Magnesium (mg/dL)  Date Value  16/07/9603 1.5 (L)   Calcium (mg/dL)  Date Value  54/06/8118 8.0 (L)   Albumin (g/dL)  Date Value  14/78/2956 3.4 (L)   Phosphorus (mg/dL)  Date Value  21/30/8657 3.1   Sodium (mmol/L)  Date Value  10/08/2023 136    Weight: 91 kg (200 lb 9.6 oz) Estimated Creatinine Clearance: 41.9 mL/min (A) (by C-G formula based on SCr of 1.6 mg/dL (H)).  Assessment  Tami Hopkins is a 62 y.o. female presenting with AMS. PMH significant for chronic pain syndrome (on Percocet, baclofen, and gabapentin), PTSD (on Klonopin), GERD, IBS, hyperlipidemia . Pharmacy has been consulted to monitor and replace electrolytes.  AKI on admission in setting of rhabdomyolysis (CK > 50k)  Diet: NPO, aspiration precautions MIVF:NS @ 125 mL/hr Pertinent medications: N/A  Goal of Therapy: Electrolytes within normal limits  Plan:  K 2.9, patient is at risk for hyperkalemia given rhabdomyolysis but currently low.Scr improving, CK improving  Will order Kcl 10 mEq IV q1h x 4 doses Mag 1.5   Will order Magnesium sulfate 2 gm IV x 1 F/u electrolytes w/ am labs  Thank you for allowing pharmacy to be a part of this patient's care.  Xavious Sharrar A 10/08/2023 9:02 AM

## 2023-10-09 ENCOUNTER — Encounter: Payer: Medicare HMO | Admitting: Dermatology

## 2023-10-09 ENCOUNTER — Encounter: Payer: Self-pay | Admitting: Internal Medicine

## 2023-10-09 DIAGNOSIS — T50904A Poisoning by unspecified drugs, medicaments and biological substances, undetermined, initial encounter: Secondary | ICD-10-CM | POA: Diagnosis not present

## 2023-10-09 LAB — HEPATIC FUNCTION PANEL
ALT: 210 U/L — ABNORMAL HIGH (ref 0–44)
AST: 448 U/L — ABNORMAL HIGH (ref 15–41)
Albumin: 3.2 g/dL — ABNORMAL LOW (ref 3.5–5.0)
Alkaline Phosphatase: 64 U/L (ref 38–126)
Bilirubin, Direct: 0.1 mg/dL (ref 0.0–0.2)
Indirect Bilirubin: 0.7 mg/dL (ref 0.3–0.9)
Total Bilirubin: 0.8 mg/dL (ref <1.2)
Total Protein: 6 g/dL — ABNORMAL LOW (ref 6.5–8.1)

## 2023-10-09 LAB — BASIC METABOLIC PANEL
Anion gap: 6 (ref 5–15)
BUN: 26 mg/dL — ABNORMAL HIGH (ref 8–23)
CO2: 20 mmol/L — ABNORMAL LOW (ref 22–32)
Calcium: 8.2 mg/dL — ABNORMAL LOW (ref 8.9–10.3)
Chloride: 109 mmol/L (ref 98–111)
Creatinine, Ser: 1.45 mg/dL — ABNORMAL HIGH (ref 0.44–1.00)
GFR, Estimated: 40 mL/min — ABNORMAL LOW (ref >60)
Glucose, Bld: 112 mg/dL — ABNORMAL HIGH (ref 70–99)
Potassium: 3.3 mmol/L — ABNORMAL LOW (ref 3.5–5.1)
Sodium: 135 mmol/L (ref 135–145)

## 2023-10-09 LAB — CBC
HCT: 36.1 % (ref 36.0–46.0)
Hemoglobin: 11.8 g/dL — ABNORMAL LOW (ref 12.0–15.0)
MCH: 32 pg (ref 26.0–34.0)
MCHC: 32.7 g/dL (ref 30.0–36.0)
MCV: 97.8 fL (ref 80.0–100.0)
Platelets: 150 10*3/uL (ref 150–400)
RBC: 3.69 MIL/uL — ABNORMAL LOW (ref 3.87–5.11)
RDW: 13.8 % (ref 11.5–15.5)
WBC: 8.6 10*3/uL (ref 4.0–10.5)
nRBC: 0 % (ref 0.0–0.2)

## 2023-10-09 LAB — URINE CULTURE: Culture: <10000 — AB

## 2023-10-09 LAB — PHOSPHORUS: Phosphorus: 3 mg/dL (ref 2.5–4.6)

## 2023-10-09 LAB — MAGNESIUM: Magnesium: 2.2 mg/dL (ref 1.7–2.4)

## 2023-10-09 LAB — CK: Total CK: 18014 U/L — ABNORMAL HIGH (ref 38–234)

## 2023-10-09 MED ORDER — MORPHINE SULFATE (PF) 2 MG/ML IV SOLN
2.0000 mg | Freq: Four times a day (QID) | INTRAVENOUS | Status: DC | PRN
  Administered 2023-10-09 – 2023-10-12 (×7): 2 mg via INTRAVENOUS
  Filled 2023-10-09 (×8): qty 1

## 2023-10-09 MED ORDER — HEPARIN SODIUM (PORCINE) 5000 UNIT/ML IJ SOLN
5000.0000 [IU] | Freq: Three times a day (TID) | INTRAMUSCULAR | Status: DC
  Administered 2023-10-09 – 2023-10-14 (×14): 5000 [IU] via SUBCUTANEOUS
  Filled 2023-10-09 (×14): qty 1

## 2023-10-09 MED ORDER — IPRATROPIUM-ALBUTEROL 0.5-2.5 (3) MG/3ML IN SOLN: 3.0000 mL | Freq: Four times a day (QID) | RESPIRATORY_TRACT | Status: DC | PRN

## 2023-10-09 MED ORDER — CHLORHEXIDINE GLUCONATE CLOTH 2 % EX PADS
6.0000 | MEDICATED_PAD | Freq: Every day | CUTANEOUS | Status: DC
  Administered 2023-10-09 – 2023-10-11 (×3): 6 via TOPICAL

## 2023-10-09 MED ORDER — POTASSIUM CHLORIDE 10 MEQ/100ML IV SOLN: 10.0000 meq | INTRAVENOUS | Status: DC

## 2023-10-09 MED ORDER — POTASSIUM CHLORIDE CRYS ER 20 MEQ PO TBCR
40.0000 meq | EXTENDED_RELEASE_TABLET | Freq: Once | ORAL | Status: AC
  Administered 2023-10-09: 40 meq via ORAL
  Filled 2023-10-09: qty 2

## 2023-10-09 MED ORDER — MAGNESIUM SULFATE 2 GM/50ML IV SOLN: 2.0000 g | Freq: Once | INTRAVENOUS | Status: DC

## 2023-10-09 MED ORDER — OXYCODONE HCL 5 MG PO TABS
5.0000 mg | ORAL_TABLET | Freq: Four times a day (QID) | ORAL | Status: DC | PRN
  Administered 2023-10-09 – 2023-10-14 (×16): 5 mg via ORAL
  Filled 2023-10-09 (×16): qty 1

## 2023-10-09 MED ORDER — SODIUM CHLORIDE 0.9 % IV SOLN: INTRAVENOUS | Status: DC

## 2023-10-09 NOTE — Progress Notes (Signed)
[facility] Progress Note  Patient: Tami Hopkins    UXL:[id number]  DOA: 10/07/2023     Date of Service: the patient was seen and examined on 10/09/2023  Chief Complaint  Patient presents with   Altered Mental Status   Brief hospital course: Tami Hopkins is a 62 y.o. female with medical history significant of chronic pain syndrome (on Percocet, baclofen, and gabapentin), PTSD (on Klonopin), GERD, IBS, hyperlipidemia, who presents to the ED due to altered mental status.   History limited due to patient's altered mental status.  Her daughter at bedside is not able to provide additional history, but states that she has been worried about patient's medications, stating she often takes more than she is supposed to.   Per chart review, EMS was called after patient's roommate found her on the bathroom floor.  Unclear how long patient was down on the bathroom floor and she is not able to tell me what led to her being there.  She cannot tell me if she took extra doses of any of her medications.   ED course: On arrival to the ED, patient was normotensive at 126/92 with heart rate of 115.  She was saturating at 92% on room air but subsequently desaturated to 87% and was placed on 3 L with improvement.  She was tachycardic up to 115.  She was afebrile at 98.4.  Initial workup notable for VBG with pH of 7.37, pCO2 of 38.  CBC with WBC of 15.1, hemoglobin of 16.4.  CK elevated above 50,000.  CMP with potassium of 3.1, bicarb 20, anion gap 17, AST 710, ALT 255, creatinine 1.87 and GFR of 30.  Lactic acid 3.0.  UDS positive for benzos, opioids, and tricyclic's.  UA with hematuria but no RBC; many bacteria, calcium oxalate, and WBC clumps noted.  Alcohol level negative.  Chest x-ray with consolidation in the right lung base.  CT head negative.  CT renal stone study with no obstructing stones.  Patient started on IV fluids and levofloxacin.  TRH contacted for admission.   Assessment and Plan:  #  Drug overdose Patient presenting with suspected drug overdose, unclear if intentional or unintentional.  She is on multiple centrally acting medications at home including gabapentin, baclofen, Percocet, Klonopin, Buspar and Seroquel (all with recent refills).  UDS also positive for tricyclic antidepressants. With current home regimen, she was at extremely high risk for an adverse event such as this.  - IV fluids as ordered - Hold home regimen - Monitor closely for withdrawal from Percocet, baclofen and Klonopin Follow psych consult  # Rhabdomyolysis In the setting of unknown downtime. CK 50K--31k ---18K trending down - IV fluids as ordered - Recheck CK in the a.m.   # Acute respiratory failure with hypoxia.  Resolved Patient was noted to be hypoxic as low as 87%.  Likely multifactorial in the setting of encephalopathy and suspected aspiration pneumonia.  Given history of COPD, that may be contributing as well although no wheezing on examination. -Supplemental O2 inhalation weaned off, currently saturating well on room air.    # Aspiration pneumonia Chest x-ray notable for right lower lobe and possible right middle lobe opacity and in the setting of drug overdose, most likely aspiration related.  History of significant penicillin allergy and no prior history of cephalosporin trial. - Levofloxacin per pharmacy dosing - Aspiration precautions   # AKI (acute kidney injury) (HCC) In the setting of dehydration and rhabdomyolysis. -Continue IV fluids - Hold home nephrotoxic  agents - Strict in and out - Repeat BMP in the a.m. Creatinine 1.45 improving   # Hypokalemia, potassium repleted. # Magnesium, mag repleted. Monitor electrolytes and replete as needed.   # Chronic obstructive pulmonary disease History of COPD at high risk of acute exacerbation in the setting of aspiration pneumonia   - DuoNebs every 6 hours as needed - Hold home bronchodilators - Hold off on steroids at this  time   # Chronic pain syndrome - Hold home Percocet, Gabapentin and Baclofen given lethargy and altered mental status - Monitor closely for evidence of withdrawal 12/18 started oxycodone 5 mg p.o. every 6 hourly as needed and morphine IV as needed.  Mental status improved.   # Post-traumatic stress disorder Patient is currently on BuSpar, Klonopin, and prazosin. - Hold home regimen in the setting of lethargy and altered mental status - CIWA monitoring given high risk for withdrawal Follow psych consult  # Renal cyst, left.  Incidental finding  Left Bosniak I benign renal cyst measuring 1.9 cm. No follow-up imaging is recommended.   Body mass index is 30.5 kg/m.  Interventions:  Diet: Regular diet DVT Prophylaxis: Subcutaneous Lovenox   Advance goals of care discussion: Full code  Family Communication: family was not present at bedside, at the time of interview.  Patient is oriented to herself only  and very confused   Disposition:  Pt is from Home, admitted with drug overdose, respiratory failure, rhabdomyolysis, still has elevated CK and electrolyte imbalance, On IV fluids , which precludes a safe discharge. Discharge to home, when stable, may need few days to improve.  Subjective: No significant events overnight, today patient is awake and alert and oriented.  Feeling generalized body ache and weakness, no focal deficits.  Patient seems depressed due to not having a good relationship with her partner.  Denies any suicidal or homicidal thoughts, denied any drug overdose.  She only took Percocet 2 tablets as per her. Respite distress improved, denied any chest pain or palpitation, no nasal complaints.  Physical Exam: General: NAD, lying comfortably Appear in mild to moderate distress, affect depressed, intermittently crying Eyes: PERRLA ENT: Oral Mucosa Clear, moist  Neck: no JVD,  Cardiovascular: S1 and S2 Present, no Murmur,  Respiratory: Equal air entry bilaterally,  mild crackles, minimal wheezing Abdomen: Bowel Sound present, Soft and no tenderness,  Skin: no rashes Extremities: no Pedal edema, no calf tenderness Neurologic: without any new focal findings Gait not checked due to patient safety concerns  Vitals:   10/09/23 0947 10/09/23 1018 10/09/23 1247 10/09/23 1247  BP:  119/86 127/75 127/75  Pulse:  88 91 91  Resp:    (!) 21  Temp: 97.7 F (36.5 C)   98.4 F (36.9 C)  TempSrc: Oral   Oral  SpO2:    96%  Weight:        Intake/Output Summary (Last 24 hours) at 10/09/2023 1501 Last data filed at 10/09/2023 1003 Gross per 24 hour  Intake 1200.56 ml  Output 1650 ml  Net -449.44 ml   Filed Weights   10/07/23 2123  Weight: 91 kg    Data Reviewed: I have personally reviewed and interpreted daily labs, tele strips, imagings as discussed above. I reviewed all nursing notes, pharmacy notes, vitals, pertinent old records I have discussed plan of care as described above with RN and patient/family.  CBC: Recent Labs  Lab 10/07/23 1552 10/08/23 0452 10/09/23 0859  WBC 15.1* 12.0* 8.6  NEUTROABS 12.9*  --   --  HGB 16.4* 13.7 11.8*  HCT 48.4* 40.6 36.1  MCV 93.6 94.6 97.8  PLT 279 223 150   Basic Metabolic Panel: Recent Labs  Lab 10/07/23 1552 10/08/23 0452 10/09/23 0859  NA 135 136 135  K 3.1* 2.9* 3.3*  CL 98 106 109  CO2 20* 21* 20*  GLUCOSE 155* 119* 112*  BUN 24* 27* 26*  CREATININE 1.87* 1.60* 1.45*  CALCIUM 9.8 8.0* 8.2*  MG 2.1 1.5* 2.2  PHOS  --  3.1 3.0    Studies: No results found.   Scheduled Meds:  enoxaparin (LOVENOX) injection  40 mg Subcutaneous QHS   folic acid  1 mg Oral Daily   ipratropium-albuterol  3 mL Nebulization Q6H   multivitamin with minerals  1 tablet Oral Daily   sodium chloride flush  3 mL Intravenous Q12H   thiamine  100 mg Oral Daily   Or   thiamine  100 mg Intravenous Daily   Continuous Infusions:  sodium chloride 125 mL/hr at 10/09/23 1008   levofloxacin (LEVAQUIN) IV      PRN Meds: acetaminophen **OR** acetaminophen, LORazepam **OR** LORazepam, morphine injection, ondansetron **OR** ondansetron (ZOFRAN) IV  Time spent: 55 minutes  Author: Gillis Santa. MD [facility] 10/09/2023 3:01 PM  To reach On-call, see care teams to locate the attending and reach out to them via [email or web address]. If 7PM-7AM, please contact night-coverage If you still have difficulty reaching the attending provider, please page the [facility] (Director on Call) for [facility] on amion for assistance.

## 2023-10-09 NOTE — ED Notes (Addendum)
Heat applied to infiltration site per recommendations- pharmacist and charge RN.

## 2023-10-09 NOTE — ED Notes (Signed)
Patient very demanding. Wants to know when she can leave and wants the doctor to tell her immediately.

## 2023-10-09 NOTE — ED Notes (Signed)
Informed RN bed assigned 

## 2023-10-09 NOTE — Consult Note (Signed)
PHARMACY CONSULT NOTE - ELECTROLYTES  Pharmacy Consult for Electrolyte Monitoring and Replacement   Recent Labs: Potassium (mmol/L)  Date Value  10/09/2023 3.3 (L)   Magnesium (mg/dL)  Date Value  16/07/9603 2.2   Calcium (mg/dL)  Date Value  54/06/8118 8.2 (L)   Albumin (g/dL)  Date Value  14/78/2956 3.2 (L)   Phosphorus (mg/dL)  Date Value  21/30/8657 3.0   Sodium (mmol/L)  Date Value  10/09/2023 135   Weight: 91 kg (200 lb 9.6 oz) Estimated Creatinine Clearance: 46.2 mL/min (A) (by C-G formula based on SCr of 1.45 mg/dL (H)).  Assessment  Tami Hopkins is a 62 y.o. female presenting with AMS. PMH significant for chronic pain syndrome (on Percocet, baclofen, and gabapentin), PTSD (on Klonopin), GERD, IBS, hyperlipidemia . Pharmacy has been consulted to monitor and replace electrolytes.  AKI on admission in setting of rhabdomyolysis (CK > 50k)  Diet: PO MIVF: NS @ 125 mL/hr Pertinent medications: N/A  Goal of Therapy: Electrolytes within normal limits  Plan:  K+ 3.3 this morning s/p 100 mEq IV KCl given yesterday  Will order an additional KCl 40 mEq PO x 1 today  F/u electrolytes w/ AM labs  Thank you for allowing pharmacy to be a part of this patient's care.  Littie Deeds, PharmD Pharmacy Resident  10/09/2023 10:14 AM

## 2023-10-09 NOTE — ED Notes (Addendum)
Traced infiltration cite, cool compress applied, RUE elevated - per md recommendations.

## 2023-10-09 NOTE — ED Notes (Signed)
This tech took over 1:1 sitting at 11a. Pt breakfast tray on side table, this tech attempted to assist pt to eat breakfast, pt refused.  Pt stated her leg hurt, this tech assisted in repositioning pt in bed and adding a pillow to side for comfort. Pt is currently tearful asking for a number, but does not know what number she needs.

## 2023-10-09 NOTE — ED Notes (Signed)
Delay on the administration of Folic Acid as it is not within the Paramedic scope of medications allowed to give. Will advise my Primary school teacher.

## 2023-10-09 NOTE — ED Notes (Signed)
Family at bedside. Daughter Magda Paganini came to visit

## 2023-10-09 NOTE — ED Notes (Signed)
Patient refused AM lab draw.

## 2023-10-09 NOTE — ED Notes (Signed)
Patient agreed to having morning labs drawn. Patient still very upset.

## 2023-10-09 NOTE — ED Notes (Signed)
Assumed patient care at approximately 0700 and received report from the previous nurse. Patient appears to be visually upset and asking about her dog and someone named French Ana. Patient was willing to take the breathing treatment and wants a coke.

## 2023-10-09 NOTE — ED Notes (Signed)
Pt requested adjusting bed, this tech assisted pt. Pt is now resting comfortably watching TV.

## 2023-10-10 ENCOUNTER — Encounter (INDEPENDENT_AMBULATORY_CARE_PROVIDER_SITE_OTHER): Payer: Medicare Other | Admitting: Nurse Practitioner

## 2023-10-10 ENCOUNTER — Encounter (INDEPENDENT_AMBULATORY_CARE_PROVIDER_SITE_OTHER): Payer: Medicare Other

## 2023-10-10 DIAGNOSIS — T50901A Poisoning by unspecified drugs, medicaments and biological substances, accidental (unintentional), initial encounter: Secondary | ICD-10-CM

## 2023-10-10 LAB — CBC
HCT: 32.2 % — ABNORMAL LOW (ref 36.0–46.0)
Hemoglobin: 10.9 g/dL — ABNORMAL LOW (ref 12.0–15.0)
MCH: 31.7 pg (ref 26.0–34.0)
MCHC: 33.9 g/dL (ref 30.0–36.0)
MCV: 93.6 fL (ref 80.0–100.0)
Platelets: 168 10*3/uL (ref 150–400)
RBC: 3.44 MIL/uL — ABNORMAL LOW (ref 3.87–5.11)
RDW: 13.7 % (ref 11.5–15.5)
WBC: 7.9 10*3/uL (ref 4.0–10.5)
nRBC: 0 % (ref 0.0–0.2)

## 2023-10-10 LAB — BLOOD GAS, VENOUS
Bicarbonate: 22 mmol/L — ABNORMAL HIGH (ref 20.0–28.0)
O2 Saturation: 48.3 mmol/L — ABNORMAL HIGH (ref 0.0–2.0)
Patient temperature: 37
Patient temperature: 48.3 %
pCO2, Ven: 38 mm[Hg] — ABNORMAL LOW (ref 44–60)
pH, Ven: 7.37 (ref 7.25–7.43)
pO2, Ven: 22 mmol/L (ref 32–45)

## 2023-10-10 LAB — PHOSPHORUS: Phosphorus: 2.2 mg/dL — ABNORMAL LOW (ref 2.5–4.6)

## 2023-10-10 LAB — HEPATIC FUNCTION PANEL
ALT: 168 U/L — ABNORMAL HIGH (ref 0–44)
AST: 260 U/L — ABNORMAL HIGH (ref 15–41)
Albumin: 2.8 g/dL — ABNORMAL LOW (ref 3.5–5.0)
Alkaline Phosphatase: 56 U/L (ref 38–126)
Bilirubin, Direct: 0.1 mg/dL (ref 0.0–0.2)
Indirect Bilirubin: 0.5 mg/dL (ref 0.3–0.9)
Total Bilirubin: 0.6 mg/dL (ref <1.2)
Total Protein: 5.6 g/dL — ABNORMAL LOW (ref 6.5–8.1)

## 2023-10-10 LAB — BASIC METABOLIC PANEL
Anion gap: 8 (ref 5–15)
BUN: 16 mg/dL (ref 8–23)
CO2: 18 mmol/L — ABNORMAL LOW (ref 22–32)
Calcium: 8.3 mg/dL — ABNORMAL LOW (ref 8.9–10.3)
Chloride: 112 mmol/L — ABNORMAL HIGH (ref 98–111)
Creatinine, Ser: 1.19 mg/dL — ABNORMAL HIGH (ref 0.44–1.00)
GFR, Estimated: 51 mL/min — ABNORMAL LOW (ref >60)
Glucose, Bld: 106 mg/dL — ABNORMAL HIGH (ref 70–99)
Potassium: 3.3 mmol/L — ABNORMAL LOW (ref 3.5–5.1)
Sodium: 138 mmol/L (ref 135–145)

## 2023-10-10 LAB — MAGNESIUM: Magnesium: 1.9 mg/dL (ref 1.7–2.4)

## 2023-10-10 LAB — CK: Total CK: 8105 U/L — ABNORMAL HIGH (ref 38–234)

## 2023-10-10 MED ORDER — ENSURE ENLIVE PO LIQD
237.0000 mL | Freq: Two times a day (BID) | ORAL | Status: DC
  Administered 2023-10-10 – 2023-10-14 (×5): 237 mL via ORAL

## 2023-10-10 MED ORDER — POTASSIUM CHLORIDE CRYS ER 20 MEQ PO TBCR
40.0000 meq | EXTENDED_RELEASE_TABLET | Freq: Three times a day (TID) | ORAL | Status: AC
  Administered 2023-10-10 (×3): 40 meq via ORAL
  Filled 2023-10-10 (×2): qty 2
  Filled 2023-10-10: qty 4

## 2023-10-10 MED ORDER — SODIUM CHLORIDE 0.9 % IV SOLN: INTRAVENOUS | Status: DC

## 2023-10-10 MED ORDER — CALCIUM CARBONATE ANTACID 500 MG PO CHEW
1.0000 | CHEWABLE_TABLET | Freq: Two times a day (BID) | ORAL | Status: DC
  Administered 2023-10-10 – 2023-10-14 (×8): 200 mg via ORAL
  Filled 2023-10-10 (×8): qty 1

## 2023-10-10 MED ORDER — SODIUM BICARBONATE 650 MG PO TABS
650.0000 mg | ORAL_TABLET | Freq: Three times a day (TID) | ORAL | Status: DC
  Administered 2023-10-10 – 2023-10-11 (×4): 650 mg via ORAL
  Filled 2023-10-10 (×4): qty 1

## 2023-10-10 MED ORDER — PANTOPRAZOLE SODIUM 40 MG PO TBEC
40.0000 mg | DELAYED_RELEASE_TABLET | Freq: Every day | ORAL | Status: DC
  Administered 2023-10-10 – 2023-10-14 (×5): 40 mg via ORAL
  Filled 2023-10-10 (×5): qty 1

## 2023-10-10 MED ORDER — OXYMETAZOLINE HCL 0.05 % NA SOLN
1.0000 | Freq: Two times a day (BID) | NASAL | Status: AC
  Administered 2023-10-10 – 2023-10-12 (×6): 1 via NASAL
  Filled 2023-10-10 (×2): qty 15

## 2023-10-10 NOTE — Plan of Care (Signed)
  Problem: Education: Goal: Knowledge of General Education information will improve Description: Including pain rating scale, medication(s)/side effects and non-pharmacologic comfort measures 10/10/2023 2252 by Charlynn Grimes, RN Outcome: Progressing 10/10/2023 2229 by Charlynn Grimes, RN Outcome: Progressing   Problem: Health Behavior/Discharge Planning: Goal: Ability to manage health-related needs will improve 10/10/2023 2252 by Charlynn Grimes, RN Outcome: Progressing 10/10/2023 2229 by Charlynn Grimes, RN Outcome: Progressing   Problem: Clinical Measurements: Goal: Ability to maintain clinical measurements within normal limits will improve Outcome: Progressing

## 2023-10-10 NOTE — Evaluation (Signed)
Occupational Therapy Evaluation Patient Details Name: CHLO… BLEAK MRN: [id number] DOB: [birth year] Today's Date: 10/10/2023   History of Present Illness 62 y/o female presented to ED on 10/07/23 for AMS 2/2 possible drug overdose. Admitted for drug overdose, rhabdomyolysis, and aspiration pneumonia. PMH: chronic pain syndrome, PTSD, COPD   Clinical Impression   Chart reviewed, pt greeted in room with sitter present. Co tx completed with PT on this date. Pt is labile throughout and requires frequent vcs for technique/safety. PTA pt endorses she is MOD I-I in ADL/IADL, amb with no AD; will need to confirm. Pt presents with deficits in strength, endurance, activity tolerance, balance, cognition affecting safe and optimal ADL completion. MOD A +2 required for bed mobility, STS multiple attempts with MIN A +2 with pt with notable RLE weakness. MAX A for washing hair at bedside, MOD A for bathing. MAX A required for LB dressing. Pt will benefit from skilled OT to address deficits and to facilitate optimal ADL performance. Pt is left in care of sitter, all needs met. OT will follow acutely.       If plan is discharge home, recommend the following: A lot of help with walking and/or transfers;A lot of help with bathing/dressing/bathroom;Assistance with cooking/housework;Direct supervision/assist for medications management;Assist for transportation;Supervision due to cognitive status;Help with stairs or ramp for entrance;Direct supervision/assist for financial management    Functional Status Assessment  Patient has had a recent decline in their functional status and demonstrates the ability to make significant improvements in function in a reasonable and predictable amount of time.  Equipment Recommendations  Other (comment) (defer to next venue of care)    Recommendations for Other Services       Precautions / Restrictions Precautions Precautions: Fall Restrictions Weight Bearing  Restrictions Per Provider Order: No      Mobility Bed Mobility Overal bed mobility: Needs Assistance Bed Mobility: Supine to Sit     Supine to sit: Mod assist, +2 for physical assistance     General bed mobility comments: frequent vcs for technique    Transfers Overall transfer level: Needs assistance Equipment used: Rolling walker (2 wheels) Transfers: Sit to/from Stand, Bed to chair/wheelchair/BSC Sit to Stand: Min assist, +2 safety/equipment     Step pivot transfers: Min assist, +2 safety/equipment            Balance Overall balance assessment: Needs assistance Sitting-balance support: Feet supported, Bilateral upper extremity supported Sitting balance-Leahy Scale: Fair     Standing balance support: Bilateral upper extremity supported, During functional activity Standing balance-Leahy Scale: Poor                             ADL either performed or assessed with clinical judgement   ADL Overall ADL's : Needs assistance/impaired Eating/Feeding: Set up;Sitting   Grooming: Brushing hair;Maximal assistance;Sitting   Upper Body Bathing: Moderate assistance;Sitting   Lower Body Bathing: Moderate assistance;Sit to/from stand   Upper Body Dressing : Minimal assistance;Sitting;Cueing for sequencing Upper Body Dressing Details (indicate cue type and reason): gown Lower Body Dressing: Maximal assistance;Sitting/lateral leans;Cueing for sequencing Lower Body Dressing Details (indicate cue type and reason): socks Toilet Transfer: +2 for safety/equipment;Minimal assistance;Cueing for safety;Cueing for sequencing Toilet Transfer Details (indicate cue type and reason): step pivot transfer           General ADL Comments: frequent vcs for encouragement/technique/safety     Vision Patient Visual Report: No change from baseline Vision Assessment?: No apparent visual deficits  Perception         Praxis         Pertinent Vitals/Pain Pain  Assessment Faces Pain Scale: Hurts even more Pain Location: foley catheter placement Pain Descriptors / Indicators: Grimacing, Guarding Pain Intervention(s): Limited activity within patient's tolerance, Monitored during session, Repositioned     Extremity/Trunk Assessment Upper Extremity Assessment Upper Extremity Assessment: Overall WFL for tasks assessed   Lower Extremity Assessment Lower Extremity Assessment: RLE deficits/detail;Defer to PT evaluation RLE Deficits / Details: Functional R LE weakness   Cervical / Trunk Assessment Cervical / Trunk Assessment: Normal   Communication Communication Communication: Difficulty following commands/understanding Following commands: Follows one step commands with increased time Cueing Techniques: Verbal cues;Tactile cues;Visual cues   Cognition Arousal: Alert Behavior During Therapy: Lability, Anxious Overall Cognitive Status: Impaired/Different from baseline Area of Impairment: Orientation, Attention, Memory, Following commands, Safety/judgement, Awareness, Problem solving                 Orientation Level: Disoriented to, Situation Current Attention Level: Sustained Memory: Decreased short-term memory Following Commands: Follows one step commands with increased time Safety/Judgement: Decreased awareness of deficits Awareness: Emergent Problem Solving: Slow processing, Decreased initiation, Difficulty sequencing, Requires verbal cues, Requires tactile cues       General Comments  Pt reports dizziness upon initial stand, vitals taken and WNL    Exercises Other Exercises Other Exercises: edu re: role of OT, role of rehab, discharge recommendations, importance of participation in OT   Shoulder Instructions      Home Living Family/patient expects to be discharged to:: Unsure Living Arrangements: Non-relatives/Friends                               Additional Comments: pt becomes upset when asked about home  set up, reports she lives with a roommate in an apartment, but likely cannot return back there      Prior Functioning/Environment Prior Level of Function : Independent/Modified Independent;Patient poor historian/Family not available               ADLs Comments: Pt reports she is MOD I-I in ADl/IADL, will need to confirm        OT Problem List: Decreased strength;Decreased activity tolerance;Decreased knowledge of use of DME or AE;Decreased safety awareness;Impaired balance (sitting and/or standing)      OT Treatment/Interventions: Self-care/ADL training;Therapeutic exercise;Patient/family education;Balance training;Energy conservation;Therapeutic activities;DME and/or AE instruction    OT Goals(Current goals can be found in the care plan section) Acute Rehab OT Goals Patient Stated Goal: get stronger OT Goal Formulation: With patient Time For Goal Achievement: 10/24/23 Potential to Achieve Goals: Good ADL Goals Pt Will Perform Grooming: with modified independence;sitting;standing Pt Will Perform Lower Body Dressing: with modified independence;sit to/from stand Pt Will Transfer to Toilet: with modified independence;ambulating Pt Will Perform Toileting - Clothing Manipulation and hygiene: with modified independence;sit to/from stand  OT Frequency: Min 1X/week    Co-evaluation PT/OT/SLP Co-Evaluation/Treatment: Yes Reason for Co-Treatment: For patient/therapist safety;To address functional/ADL transfers PT goals addressed during session: Mobility/safety with mobility;Balance OT goals addressed during session: ADL's and self-care      AM-PAC OT "6 Clicks" Daily Activity     Outcome Measure Help from another person eating meals?: None Help from another person taking care of personal grooming?: A Lot Help from another person toileting, which includes using toliet, bedpan, or urinal?: A Lot Help from another person bathing (including washing, rinsing, drying)?: A Lot Help from  another person to put on and taking off regular upper body clothing?: A Little Help from another person to put on and taking off regular lower body clothing?: A Lot 6 Click Score: 15   End of Session Equipment Utilized During Treatment: Rolling walker (2 wheels) Nurse Communication: Mobility status  Activity Tolerance: Patient tolerated treatment well Patient left: in chair;with call bell/phone within reach;with nursing/sitter in room (1:1 sitter)  OT Visit Diagnosis: Other abnormalities of gait and mobility (R26.89);Unsteadiness on feet (R26.81)                Time: 1610-9604 OT Time Calculation (min): 21 min Charges:  OT General Charges $OT Visit: 1 Visit OT Evaluation $OT Eval Moderate Complexity: 1 Mod  Oleta Mouse, OTD OTR/L  10/10/23, 2:43 PM

## 2023-10-10 NOTE — Plan of Care (Signed)

## 2023-10-10 NOTE — Progress Notes (Signed)
[facility] Progress Note  Patient: Tami Hopkins    AVW:[id number]  DOA: 10/07/2023     Date of Service: the patient was seen and examined on 10/10/2023  Chief Complaint  Patient presents with   Altered Mental Status   Brief hospital course: Tami Hopkins is a 62 y.o. female with medical history significant of chronic pain syndrome (on Percocet, baclofen, and gabapentin), PTSD (on Klonopin), GERD, IBS, hyperlipidemia, who presents to the ED due to altered mental status.   History limited due to patient's altered mental status.  Her daughter at bedside is not able to provide additional history, but states that she has been worried about patient's medications, stating she often takes more than she is supposed to.   Per chart review, EMS was called after patient's roommate found her on the bathroom floor.  Unclear how long patient was down on the bathroom floor and she is not able to tell me what led to her being there.  She cannot tell me if she took extra doses of any of her medications.   ED course: On arrival to the ED, patient was normotensive at 126/92 with heart rate of 115.  She was saturating at 92% on room air but subsequently desaturated to 87% and was placed on 3 L with improvement.  She was tachycardic up to 115.  She was afebrile at 98.4.  Initial workup notable for VBG with pH of 7.37, pCO2 of 38.  CBC with WBC of 15.1, hemoglobin of 16.4.  CK elevated above 50,000.  CMP with potassium of 3.1, bicarb 20, anion gap 17, AST 710, ALT 255, creatinine 1.87 and GFR of 30.  Lactic acid 3.0.  UDS positive for benzos, opioids, and tricyclic's.  UA with hematuria but no RBC; many bacteria, calcium oxalate, and WBC clumps noted.  Alcohol level negative.  Chest x-ray with consolidation in the right lung base.  CT head negative.  CT renal stone study with no obstructing stones.  Patient started on IV fluids and levofloxacin.  [facility] contacted for admission.   Assessment and Plan:  #  Drug overdose Patient presenting with suspected drug overdose, unclear if intentional or unintentional.  She is on multiple centrally acting medications at home including gabapentin, baclofen, Percocet, Klonopin, Buspar and Seroquel (all with recent refills).  UDS also positive for tricyclic antidepressants. With current home regimen, she was at extremely high risk for an adverse event such as this.  - IV fluids as ordered - Hold home regimen - Monitor closely for withdrawal from Percocet, baclofen and Klonopin Follow psych consult  # Rhabdomyolysis In the setting of unknown downtime. CK 50K--31k ---18--8K trending down - IV fluids as ordered - Recheck CK in the a.m.   # Acute respiratory failure with hypoxia.  Resolved Patient was noted to be hypoxic as low as 87%.  Likely multifactorial in the setting of encephalopathy and suspected aspiration pneumonia.  Given history of COPD, that may be contributing as well although no wheezing on examination. -Supplemental O2 inhalation weaned off, currently saturating well on room air.    # Aspiration pneumonia Chest x-ray notable for right lower lobe and possible right middle lobe opacity and in the setting of drug overdose, most likely aspiration related.  History of significant penicillin allergy and no prior history of cephalosporin trial. - Levofloxacin per pharmacy dosing - Aspiration precautions   # AKI (acute kidney injury) (HCC) In the setting of dehydration and rhabdomyolysis. -Continue IV fluids - Hold home nephrotoxic  agents - Strict in and out - Repeat BMP in the a.m. Creatinine 1.19 improving   # Transaminitis, monitor LFTs.  # Hypokalemia, potassium repleted. # Magnesium, mag repleted. # Hypophosphatemia, continue to monitor Monitor electrolytes and replete as needed.   # Chronic obstructive pulmonary disease History of COPD at high risk of acute exacerbation in the setting of aspiration pneumonia   - DuoNebs every 6  hours as needed - Hold home bronchodilators - Hold off on steroids at this time   # Chronic pain syndrome - Hold home Percocet, Gabapentin and Baclofen given lethargy and altered mental status - Monitor closely for evidence of withdrawal 12/18 started oxycodone 5 mg p.o. every 6 hourly as needed and morphine IV as needed.  Mental status improved.   # Post-traumatic stress disorder Patient is currently on BuSpar, Klonopin, and prazosin. - Hold home regimen in the setting of lethargy and altered mental status - CIWA monitoring given high risk for withdrawal Follow psych consult  # Renal cyst, left.  Incidental finding  Left Bosniak I benign renal cyst measuring 1.9 cm. No follow-up imaging is recommended.   Body mass index is 30.3 kg/m.  Interventions:  Diet: Regular diet DVT Prophylaxis: Subcutaneous Lovenox   Advance goals of care discussion: Full code  Family Communication: family was not present at bedside, at the time of interview.  Patient is oriented to herself only  and very confused   Disposition:  Pt is from Home, admitted with drug overdose, respiratory failure, rhabdomyolysis, still has elevated CK and electrolyte imbalance, On IV fluids , which precludes a safe discharge. Discharge to home, when stable, may need few days to improve.  Subjective: No significant events overnight, patient more awake and alert, still feels weak and has not ambulated yet.  Denied any specific complaints. Patient does not wanted her daughter to be in the room, discussed with charge nurse to take care of that.   Physical Exam: General: NAD, lying comfortably Appear in mild to moderate distress, affect depressed, intermittently crying Eyes: PERRLA ENT: Oral Mucosa Clear, moist  Neck: no JVD,  Cardiovascular: S1 and S2 Present, no Murmur,  Respiratory: Equal air entry bilaterally, mild crackles, No wheezing Abdomen: Bowel Sound present, Soft and no tenderness,  Skin: no  rashes Extremities: no Pedal edema, no calf tenderness Neurologic: without any new focal findings Gait not checked due to patient safety concerns  Vitals:   10/10/23 0426 10/10/23 0732 10/10/23 1132 10/10/23 1159  BP:  125/85 132/89 (!) 130/96  Pulse:  81 85 93  Resp:  20 20 20   Temp:  98.2 F (36.8 C) 98.1 F (36.7 C)   TempSrc:   Oral   SpO2:  94% 96% 97%  Weight: 90.4 kg     Height:        Intake/Output Summary (Last 24 hours) at 10/10/2023 1508 Last data filed at 10/10/2023 1300 Gross per 24 hour  Intake 5504.69 ml  Output 3925 ml  Net 1579.69 ml   Filed Weights   10/07/23 2123 10/10/23 0426  Weight: 91 kg 90.4 kg    Data Reviewed: I have personally reviewed and interpreted daily labs, tele strips, imagings as discussed above. I reviewed all nursing notes, pharmacy notes, vitals, pertinent old records I have discussed plan of care as described above with RN and patient/family.  CBC: Recent Labs  Lab 10/07/23 1552 10/08/23 0452 10/09/23 0859 10/10/23 0637  WBC 15.1* 12.0* 8.6 7.9  NEUTROABS 12.9*  --   --   --  HGB 16.4* 13.7 11.8* 10.9*  HCT 48.4* 40.6 36.1 32.2*  MCV 93.6 94.6 97.8 93.6  PLT 279 223 150 168   Basic Metabolic Panel: Recent Labs  Lab 10/07/23 1552 10/08/23 0452 10/09/23 0859 10/10/23 0637  NA 135 136 135 138  K 3.1* 2.9* 3.3* 3.3*  CL 98 106 109 112*  CO2 20* 21* 20* 18*  GLUCOSE 155* 119* 112* 106*  BUN 24* 27* 26* 16  CREATININE 1.87* 1.60* 1.45* 1.19*  CALCIUM 9.8 8.0* 8.2* 8.3*  MG 2.1 1.5* 2.2 1.9  PHOS  --  3.1 3.0 2.2*    Studies: No results found.   Scheduled Meds:  calcium carbonate  1 tablet Oral BID WC   Chlorhexidine Gluconate Cloth  6 each Topical Daily   feeding supplement  237 mL Oral BID BM   folic acid  1 mg Oral Daily   heparin injection (subcutaneous)  5,000 Units Subcutaneous Q8H   multivitamin with minerals  1 tablet Oral Daily   oxymetazoline  1 spray Each Nare BID   potassium chloride  40 mEq  Oral TID   sodium chloride flush  3 mL Intravenous Q12H   thiamine  100 mg Oral Daily   Or   thiamine  100 mg Intravenous Daily   Continuous Infusions:  sodium chloride 125 mL/hr at 10/10/23 0907   levofloxacin (LEVAQUIN) IV Stopped (10/09/23 1827)   PRN Meds: acetaminophen **OR** acetaminophen, ipratropium-albuterol, LORazepam **OR** LORazepam, morphine injection, ondansetron **OR** ondansetron (ZOFRAN) IV, oxyCODONE  Time spent: 40 minutes  Author: Gillis Santa. MD [facility] 10/10/2023 3:08 PM  To reach On-call, see care teams to locate the attending and reach out to them via [email or web address]. If 7PM-7AM, please contact night-coverage If you still have difficulty reaching the attending provider, please page the [facility] (Director on Call) for [facility] on amion for assistance.

## 2023-10-10 NOTE — Plan of Care (Signed)
  Problem: Education: Goal: Knowledge of General Education information will improve Description Including pain rating scale, medication(s)/side effects and non-pharmacologic comfort measures Outcome: Progressing   Problem: Health Behavior/Discharge Planning: Goal: Ability to manage health-related needs will improve Outcome: Progressing   

## 2023-10-10 NOTE — Consult Note (Signed)
[facility] Face-to-Face Psychiatry Consult   Reason for Consult: Overdose Referring Physician:  Gillis Santa, MD  Patient Identification: Tami Hopkins MRN:  [id number] Principal Diagnosis: Drug overdose Diagnosis:  Principal Problem:   Drug overdose Active Problems:   Rhabdomyolysis   Chronic obstructive pulmonary disease (HCC)   Post-traumatic stress disorder   Chronic pain syndrome   Acute respiratory failure with hypoxia (HCC)   Aspiration pneumonia (HCC)   AKI (acute kidney injury) (HCC)   Total Time spent with patient: 20 minutes  Subjective:   Tami Hopkins is a 62 y.o. female patient admitted with overdose and mental status change.Tami Hopkins  HPI: Patient presented to the emergency room with her daughter  Past Psychiatric History: Patient was admitted after taking too many medications.  Risk to Self:   Risk to Others:   Prior Inpatient Therapy:   Prior Outpatient Therapy:    Past Medical History:  Past Medical History:  Diagnosis Date   Anxiety    Asthma    Bursitis of hip bilateral    Hyperlipidemia    PTSD (post-traumatic stress disorder)    Scoliosis     Past Surgical History:  Procedure Laterality Date   ABDOMINAL HYSTERECTOMY     CESAREAN SECTION     CHOLECYSTECTOMY     ENDOMETRIAL ABLATION     x3   NOSE SURGERY     Family History:  Family History  Problem Relation Age of Onset   Asthma Mother    Arthritis Mother    Depression Mother    Heart attack Father    Alcohol abuse Father    Hypertension Sister    Diabetes Sister    Obesity Sister    Breast cancer Sister 34       diagnosed a 2nd time metastic   Endometriosis Daughter    Endometriosis Daughter    Breast cancer Maternal Aunt        Great aunt   Heart failure Neg Hx    Family Psychiatric  History: Unremarkable Social History:  Social History   Substance and Sexual Activity  Alcohol Use No   Alcohol/week: 0.0 - 2.0 standard drinks of alcohol   Comment: rare     Social History    Substance and Sexual Activity  Drug Use No    Social History   Socioeconomic History   Marital status: Significant Other    Spouse name: Not on file   Number of children: Not on file   Years of education: Not on file   Highest education level: Not on file  Occupational History   Not on file  Tobacco Use   Smoking status: Every Day    Current packs/day: 0.25    Average packs/day: 0.3 packs/day for 28.1 years (7.0 ttl pk-yrs)    Types: Cigarettes    Start date: 08/18/1995   Smokeless tobacco: Never  Vaping Use   Vaping status: Never Used  Substance and Sexual Activity   Alcohol use: No    Alcohol/week: 0.0 - 2.0 standard drinks of alcohol    Comment: rare   Drug use: No   Sexual activity: Yes    Birth control/protection: None  Other Topics Concern   Not on file  Social History Narrative   Not on file   Social Drivers of Health   Financial Resource Strain: Medium Risk (09/25/2022)   Received from [facility] [place] [facility], [facility]   Overall Financial Resource Strain (CARDIA)    Difficulty of Paying Living  Expenses: Somewhat hard  Food Insecurity: Food Insecurity Present (10/10/2023)   Hunger Vital Sign    Worried About Running Out of Food in the Last Year: Sometimes true    Ran Out of Food in the Last Year: Sometimes true  Transportation Needs: No Transportation Needs (10/09/2023)   PRAPARE - Administrator, Civil Service (Medical): No    Lack of Transportation (Non-Medical): No  Physical Activity: Insufficiently Active (01/13/2021)   Received from [facility], [facility]   Exercise Vital Sign    Days of Exercise per Week: 3 days    Minutes of Exercise per Session: 20 min  Stress: Stress Concern Present (01/13/2021)   Received from [facility], [facility]   [employer] of Occupational Health - Occupational Stress Questionnaire    Feeling of Stress  : Rather much  Social Connections: Socially Isolated (01/13/2021)   Received from [facility], [facility]   Social Connection and Isolation Panel [NHANES]    Frequency of Communication with Friends and Family: More than three times a week    Frequency of Social Gatherings with Friends and Family: Once a week    Attends Religious Services: Never    Database administrator or Organizations: No    Attends Banker Meetings: Never    Marital Status: Divorced   Additional Social History:    Allergies:   Allergies  Allergen Reactions   Penicillins Anaphylaxis    Any "cillins"   Sulfa Antibiotics Anaphylaxis   Amitriptyline     Can not take with other medications  Can not take with other medications  Can not take with other medications    Bupropion     Can not take with other combination medications  Can not take with other combination medications  Can not take with other combination medications    Doxepin     Can not take with other medications  Can not take with other medications  Can not take with other medications    Moxifloxacin Other (See Comments)    Labs:  Results for orders placed or performed during the hospital encounter of 10/07/23 (from the past 48 hours)  Basic metabolic panel     Status: Abnormal   Collection Time: 10/09/23  8:59 AM  Result Value Ref Range   Sodium 135 135 - 145 mmol/L   Potassium 3.3 (L) 3.5 - 5.1 mmol/L   Chloride 109 98 - 111 mmol/L   CO2 20 (L) 22 - 32 mmol/L   Glucose, Bld 112 (H) 70 - 99 mg/dL    Comment: Glucose reference range applies only to samples taken after fasting for at least 8 hours.   BUN 26 (H) 8 - 23 mg/dL   Creatinine, Ser 1.61 (H) 0.44 - 1.00 mg/dL   Calcium 8.2 (L) 8.9 - 10.3 mg/dL   GFR, Estimated 40 (L) >60 mL/min    Comment: (NOTE) Calculated using the CKD-EPI Creatinine Equation (2021)    Anion gap 6 5 - 15    Comment: Performed at [facility], [address] [place], [place] [address]  CBC     Status: Abnormal   Collection Time: 10/09/23  8:59 AM  Result Value Ref Range   WBC 8.6 4.0 - 10.5 K/uL   RBC 3.69 (L) 3.87 - 5.11 MIL/uL   Hemoglobin 11.8 (L) 12.0 - 15.0 g/dL   HCT 54.0 98.1 - 19.1 %  MCV 97.8 80.0 - 100.0 fL   MCH 32.0 26.0 - 34.0 pg   MCHC 32.7 30.0 - 36.0 g/dL   RDW 16.1 09.6 - 04.5 %   Platelets 150 150 - 400 K/uL   nRBC 0.0 0.0 - 0.2 %    Comment: Performed at [facility], [address] [place], [place] [address]  Magnesium     Status: None   Collection Time: 10/09/23  8:59 AM  Result Value Ref Range   Magnesium 2.2 1.7 - 2.4 mg/dL    Comment: Performed at [facility], [address] [place], [place] [address]  Phosphorus     Status: None   Collection Time: 10/09/23  8:59 AM  Result Value Ref Range   Phosphorus 3.0 2.5 - 4.6 mg/dL    Comment: Performed at [facility], [address] [place], [place] [address]  Hepatic function panel     Status: Abnormal   Collection Time: 10/09/23  8:59 AM  Result Value Ref Range   Total Protein 6.0 (L) 6.5 - 8.1 g/dL   Albumin 3.2 (L) 3.5 - 5.0 g/dL   AST 213 (H) 15 - 41 U/L   ALT 210 (H) 0 - 44 U/L   Alkaline Phosphatase 64 38 - 126 U/L   Total Bilirubin 0.8 <1.2 mg/dL   Bilirubin, Direct 0.1 0.0 - 0.2 mg/dL   Indirect Bilirubin 0.7 0.3 - 0.9 mg/dL    Comment: Performed at [facility], [address] [place], [place] [address]  CK     Status: Abnormal   Collection Time: 10/09/23  8:59 AM  Result Value Ref Range   Total CK 18,014 (H) 38 - 234 U/L    Comment: RESULT CONFIRMED BY MANUAL DILUTION   SH Performed at [facility], [address], [place], [place] [address]   Blood Culture (routine x 2)     Status: None (Preliminary result)   Collection Time: 10/09/23  7:34 PM   Specimen: BLOOD  Result Value Ref Range   Specimen Description BLOOD BLOOD RIGHT HAND    Special Requests      BOTTLES DRAWN AEROBIC AND  ANAEROBIC Blood Culture adequate volume   Culture      NO GROWTH < 12 HOURS Performed at [facility], [address] [place], [place] [address]    Report Status PENDING   Basic metabolic panel     Status: Abnormal   Collection Time: 10/10/23  6:37 AM  Result Value Ref Range   Sodium 138 135 - 145 mmol/L   Potassium 3.3 (L) 3.5 - 5.1 mmol/L   Chloride 112 (H) 98 - 111 mmol/L   CO2 18 (L) 22 - 32 mmol/L   Glucose, Bld 106 (H) 70 - 99 mg/dL    Comment: Glucose reference range applies only to samples taken after fasting for at least 8 hours.   BUN 16 8 - 23 mg/dL   Creatinine, Ser 4.13 (H) 0.44 - 1.00 mg/dL   Calcium 8.3 (L) 8.9 - 10.3 mg/dL   GFR, Estimated 51 (L) >60 mL/min    Comment: (NOTE) Calculated using the CKD-EPI Creatinine Equation (2021)    Anion gap 8 5 - 15    Comment: Performed at [facility], [address] [place], [place] [address]  CBC     Status: Abnormal   Collection Time: 10/10/23  6:37 AM  Result Value Ref Range   WBC 7.9 4.0 - 10.5 K/uL   RBC  3.44 (L) 3.87 - 5.11 MIL/uL   Hemoglobin 10.9 (L) 12.0 - 15.0 g/dL   HCT 53.6 (L) 64.4 - 03.4 %   MCV 93.6 80.0 - 100.0 fL   MCH 31.7 26.0 - 34.0 pg   MCHC 33.9 30.0 - 36.0 g/dL   RDW 74.2 59.5 - 63.8 %   Platelets 168 150 - 400 K/uL   nRBC 0.0 0.0 - 0.2 %    Comment: Performed at [facility], [address], [place], [place] [address]  Magnesium     Status: None   Collection Time: 10/10/23  6:37 AM  Result Value Ref Range   Magnesium 1.9 1.7 - 2.4 mg/dL    Comment: Performed at [facility], [address] [place], [place] [address]  Phosphorus     Status: Abnormal   Collection Time: 10/10/23  6:37 AM  Result Value Ref Range   Phosphorus 2.2 (L) 2.5 - 4.6 mg/dL    Comment: Performed at [facility], [address] [place], [place] [address]  Hepatic function panel     Status: Abnormal   Collection Time: 10/10/23  6:37 AM  Result Value Ref Range    Total Protein 5.6 (L) 6.5 - 8.1 g/dL   Albumin 2.8 (L) 3.5 - 5.0 g/dL   AST 606 (H) 15 - 41 U/L   ALT 168 (H) 0 - 44 U/L   Alkaline Phosphatase 56 38 - 126 U/L   Total Bilirubin 0.6 <1.2 mg/dL   Bilirubin, Direct 0.1 0.0 - 0.2 mg/dL   Indirect Bilirubin 0.5 0.3 - 0.9 mg/dL    Comment: Performed at [facility], [address] [place], [place] [address]  CK     Status: Abnormal   Collection Time: 10/10/23  6:37 AM  Result Value Ref Range   Total CK 8,105 (H) 38 - 234 U/L    Comment: RESULT CONFIRMED BY MANUAL DILUTION DAS Performed at [facility], [address] [place], [place] [address]     Current Facility-Administered Medications  Medication Dose Route Frequency Provider Last Rate Last Admin   0.9 %  sodium chloride infusion   Intravenous Continuous Gillis Santa, MD 125 mL/hr at 10/10/23 0907 New Bag at 10/10/23 3557   acetaminophen (TYLENOL) tablet 650 mg  650 mg Oral Q6H PRN Verdene Lennert, MD       Or   acetaminophen (TYLENOL) suppository 650 mg  650 mg Rectal Q6H PRN Verdene Lennert, MD       Chlorhexidine Gluconate Cloth 2 % PADS 6 each  6 each Topical Daily Gillis Santa, MD   6 each at 10/10/23 0906   folic acid (FOLVITE) tablet 1 mg  1 mg Oral Daily Verdene Lennert, MD   1 mg at 10/10/23 0904   heparin injection 5,000 Units  5,000 Units Subcutaneous Q8H Gillis Santa, MD   5,000 Units at 10/10/23 0545   ipratropium-albuterol (DUONEB) 0.5-2.5 (3) MG/3ML nebulizer solution 3 mL  3 mL Nebulization Q6H PRN Gillis Santa, MD       levofloxacin (LEVAQUIN) IVPB 750 mg  750 mg Intravenous Q48H Merryl Hacker, RPH   Held at 10/09/23 1827   LORazepam (ATIVAN) tablet 1-4 mg  1-4 mg Oral Q1H PRN Jawo, Modou L, NP   1 mg at 10/10/23 0904   Or   LORazepam (ATIVAN) injection 1-4 mg  1-4 mg Intravenous Q1H PRN Jawo, Modou L, NP   1 mg at 10/09/23 0934   morphine (PF) 2 MG/ML  injection 2 mg  2 mg Intravenous Q6H PRN Gillis Santa, MD   2 mg at 10/10/23 0546    multivitamin with minerals tablet 1 tablet  1 tablet Oral Daily Verdene Lennert, MD   1 tablet at 10/10/23 0904   ondansetron (ZOFRAN) tablet 4 mg  4 mg Oral Q6H PRN Verdene Lennert, MD       Or   ondansetron [facility]) injection 4 mg  4 mg Intravenous Q6H PRN Verdene Lennert, MD   4 mg at 10/08/23 0759   oxyCODONE (Oxy IR/ROXICODONE) immediate release tablet 5 mg  5 mg Oral Q6H PRN Gillis Santa, MD   5 mg at 10/10/23 0417   potassium chloride SA (KLOR-CON M) CR tablet 40 mEq  40 mEq Oral TID Ronnald Ramp, RPH   40 mEq at 10/10/23 1610   sodium chloride flush (NS) 0.9 % injection 3 mL  3 mL Intravenous Q12H Verdene Lennert, MD   3 mL at 10/10/23 0912   thiamine (VITAMIN B1) tablet 100 mg  100 mg Oral Daily Verdene Lennert, MD   100 mg at 10/10/23 9604   Or   thiamine (VITAMIN B1) injection 100 mg  100 mg Intravenous Daily Verdene Lennert, MD   100 mg at 10/09/23 0932    Musculoskeletal: Strength & Muscle Tone: within normal limits Gait & Station:  Not observed Patient leans: N/A            Psychiatric Specialty Exam:  Presentation  General Appearance: No data recorded Eye Contact:No data recorded Speech:No data recorded Speech Volume:No data recorded Handedness:No data recorded  Mood and Affect  Mood:No data recorded Affect:No data recorded  Thought Process  Thought Processes:No data recorded Descriptions of Associations:No data recorded Orientation:No data recorded Thought Content:No data recorded History of Schizophrenia/Schizoaffective disorder:No data recorded Duration of Psychotic Symptoms:No data recorded Hallucinations:No data recorded Ideas of Reference:No data recorded Suicidal Thoughts:No data recorded Homicidal Thoughts:No data recorded  Sensorium  Memory:No data recorded Judgment:No data recorded Insight:No data recorded  Executive Functions  Concentration:No data recorded Attention Span:No data recorded Recall:No data recorded Fund of  Knowledge:No data recorded Language:No data recorded  Psychomotor Activity  Psychomotor Activity:No data recorded  Assets  Assets:No data recorded  Sleep  Sleep:No data recorded  Physical Exam: Physical Exam Vitals and nursing note reviewed.  Constitutional:      Appearance: Normal appearance. She is normal weight.  Neurological:     General: No focal deficit present.     Mental Status: She is alert and oriented to person, place, and time.  Psychiatric:        Attention and Perception: Attention and perception normal.        Mood and Affect: Mood normal. Affect is labile.        Speech: Speech normal.        Behavior: Behavior normal. Behavior is cooperative.        Thought Content: Thought content normal.        Cognition and Memory: Cognition and memory normal.        Judgment: Judgment is impulsive.   MENTAL STATUS EXAM: Patient is alert and oriented x 3, pleasant and cooperative, good eye contact, speech is normal and not pressured, mood is euthymic; affect is congruent; thought process: goal directed; thought content: She denies that this was a suicide attempt judgment is poor, insight is poor.  I spoke with her in front of her daughter and she sees a Therapist, sports through video chat.  Daughter states  that she thinks that she takes too many medications and the patient states that that is not true.  But looking at her chart it looks like she is on polypharmacy.  I did inform her that if she continues to "" pass out, that she will end up back in the hospital and most likely into psychiatry.  Review of Systems  Constitutional: Negative.   HENT: Negative.    Eyes: Negative.   Respiratory: Negative.    Cardiovascular: Negative.   Gastrointestinal: Negative.   Genitourinary: Negative.   Musculoskeletal: Negative.   Skin: Negative.   Neurological: Negative.   Endo/Heme/Allergies: Negative.   Psychiatric/Behavioral: Negative.     Blood pressure 132/89, pulse 85, temperature  98.1 F (36.7 C), temperature source Oral, resp. rate 20, height 5\' 8"  (1.727 m), weight 90.4 kg, SpO2 96%. Body mass index is 30.3 kg/m.  Treatment Plan Summary: Discharge home when medically clear   Disposition: Patient does not meet criteria for psychiatric inpatient admission.  Sarina Ill, DO 10/10/2023 11:38 AM

## 2023-10-10 NOTE — Progress Notes (Signed)
This RN (Consulting civil engineer at the time) was called to room per MD request. Daughter and Recruitment consultant at bedside. Patient and daughter tearful. Patient's daughter expressing concerns for patient to understand why she was admitted. Patient stated she "does not want to share information with [daughter], and would like [daughter] to leave at this time. Daughter's name is in the chart as person of contact for patient. Patient is not congruent with recall of events that led to hospital admission at this time. Daughter asked to leave the room at this time to reduce patient agitation. Daughter left willingly and calmly. Provider Gillis Santa, MD made aware.

## 2023-10-10 NOTE — Evaluation (Signed)
Physical Therapy Evaluation Patient Details Name: Tami Hopkins MRN: [id number] DOB: 09/18/1961 Today's Date: 10/10/2023  History of Present Illness  62 y/o female presented to ED on 10/07/23 for AMS 2/2 possible drug overdose. Admitted for drug overdose, rhabdomyolysis, and aspiration pneumonia. PMH: chronic pain syndrome, PTSD, COPD  Clinical Impression  Patient admitted with the above. PTA, patient lived with roommate but unsure if able to return back there. Patient was independent prior as well. Patient presents with R LE weakness, impaired balance, decreased activity tolerance, and impaired functional mobility. Required modA+2 for bed mobility and minA+2 for sit to stand and step pivot transfer. Decreased awareness into situation and emotionally labile throughout session. Follows commands appropriately. Patient will benefit from skilled PT services during acute stay to address listed deficits. Patient will benefit from ongoing therapy at discharge to maximize functional independence and safety.         If plan is discharge home, recommend the following: A lot of help with walking and/or transfers;A lot of help with bathing/dressing/bathroom;Assistance with cooking/housework;Direct supervision/assist for medications management;Direct supervision/assist for financial management;Assist for transportation;Help with stairs or ramp for entrance   Can travel by private vehicle   No    Equipment Recommendations Rolling Roux Brandy (2 wheels);BSC/3in1  Recommendations for Other Services       Functional Status Assessment Patient has had a recent decline in their functional status and demonstrates the ability to make significant improvements in function in a reasonable and predictable amount of time.     Precautions / Restrictions Precautions Precautions: Fall Restrictions Weight Bearing Restrictions Per Provider Order: No      Mobility  Bed Mobility Overal bed mobility: Needs  Assistance Bed Mobility: Supine to Sit     Supine to sit: Mod assist, +2 for physical assistance     General bed mobility comments: assist for trunk elevation to EOB and inital sitting balance    Transfers Overall transfer level: Needs assistance Equipment used: Rolling [place]on Mergenthaler (2 wheels) Transfers: Sit to/from Stand, Bed to chair/wheelchair/BSC Sit to Stand: Min assist, +2 safety/equipment   Step pivot transfers: Min assist, +2 safety/equipment       General transfer comment: light minA to come into standing and with R foot placement prior to standing. Assist for balance and RW management. No overt LOB noted during transfer. Cues for hand placement on chair prior to sitting    Ambulation/Gait                  Stairs            Wheelchair Mobility     Tilt Bed    Modified Rankin (Stroke Patients Only)       Balance Overall balance assessment: Needs assistance Sitting-balance support: Feet supported, Bilateral upper extremity supported Sitting balance-Leahy Scale: Fair     Standing balance support: Bilateral upper extremity supported, During functional activity Standing balance-Leahy Scale: Poor                               Pertinent Vitals/Pain Pain Assessment Pain Assessment: Faces Faces Pain Scale: Hurts even more Pain Location: foley catheter placement Pain Descriptors / Indicators: Grimacing, Guarding Pain Intervention(s): Monitored during session, Limited activity within patient's tolerance, Repositioned    Home Living Family/patient expects to be discharged to:: Unsure                        Prior Function  Prior Level of Function : Independent/Modified Independent                     Extremity/Trunk Assessment   Upper Extremity Assessment Upper Extremity Assessment: Defer to OT evaluation    Lower Extremity Assessment Lower Extremity Assessment: RLE deficits/detail RLE Deficits / Details: Functional  R LE weakness    Cervical / Trunk Assessment Cervical / Trunk Assessment: Normal  Communication   Communication Communication: No apparent difficulties  Cognition Arousal: Alert Behavior During Therapy: WFL for tasks assessed/performed Overall Cognitive Status: No family/caregiver present to determine baseline cognitive functioning                                 General Comments: poor insight into current situation. Follows commands appropriately. Emotionally labile throughout session        General Comments      Exercises     Assessment/Plan    PT Assessment Patient needs continued PT services  PT Problem List Decreased strength;Decreased balance;Decreased activity tolerance;Decreased mobility;Decreased safety awareness;Decreased knowledge of use of DME       PT Treatment Interventions DME instruction;Gait training;Functional mobility training;Therapeutic activities;Balance training;Therapeutic exercise;Patient/family education    PT Goals (Current goals can be found in the Care Plan section)  Acute Rehab PT Goals Patient Stated Goal: did not state PT Goal Formulation: With patient Time For Goal Achievement: 10/24/23 Potential to Achieve Goals: Good    Frequency Min 1X/week     Co-evaluation PT/OT/SLP Co-Evaluation/Treatment: Yes Reason for Co-Treatment: For patient/therapist safety;To address functional/ADL transfers PT goals addressed during session: Mobility/safety with mobility;Balance         AM-PAC PT "6 Clicks" Mobility  Outcome Measure Help needed turning from your back to your side while in a flat bed without using bedrails?: A Lot Help needed moving from lying on your back to sitting on the side of a flat bed without using bedrails?: A Lot Help needed moving to and from a bed to a chair (including a wheelchair)?: A Lot Help needed standing up from a chair using your arms (e.g., wheelchair or bedside chair)?: A Lot Help needed to walk  in hospital room?: A Lot Help needed climbing 3-5 steps with a railing? : Total 6 Click Score: 11    End of Session   Activity Tolerance: Patient tolerated treatment well Patient left: in chair;with call bell/phone within reach;with nursing/sitter in room Nurse Communication: Mobility status PT Visit Diagnosis: Unsteadiness on feet (R26.81);Muscle weakness (generalized) (M62.81);Difficulty in walking, not elsewhere classified (R26.2)    Time: 4403-4742 PT Time Calculation (min) (ACUTE ONLY): 23 min   Charges:   PT Evaluation $PT Eval Moderate Complexity: 1 Mod   PT General Charges $$ ACUTE PT VISIT: 1 Visit         Maylon Peppers, PT, DPT Physical Therapist - [facility]  [facility]   Livier Hendel A Evella Kasal 10/10/2023, 2:01 PM

## 2023-10-10 NOTE — Discharge Instructions (Addendum)
 Food Resources  Agency Name: [facility] Address: [address] [place], [place] [address] Phone: [phone] Website: [email or web address] Service(s) Offered: Housing services, self-sufficiency, congregate meal program, weatherization program, Event organiser program, emergency food assistance,  housing counseling, home ownership program, wheels - to work program.  [employer] free for 60 and older at various locations from [employer], Monday-Friday:  [employer], [address] [place], [phone] -[facility], [address], [place] [phone]  -[facility], [address], [place] [phone]  -[address], [place], [phone]  Agency Name: [facility] Address: [phone] [address], [place], [place] [address] Phone: [phone] Website: www.[email or web address] Service(s) Offered: Home delivered hot, frozen, and emergency  meals. Grocery assistance program which matches  volunteers one-on-one with seniors unable to grocery shop  for themselves. Must be 60 years and older; less than 20  hours of in-home aide service, limited or no driving ability;  live alone or with someone with a disability; live in  [place].  Agency Name: Ecologist [facility]) Address: [address], [place], [place] [address] Phone: [phone] Service(s) Offered: Food is served to shut-ins, homeless, elderly, and low income people in the community every Saturday (11:30 am-12:30 pm) and Sunday (12:30 pm-1:30pm). Volunteers also offer help and encouragement in seeking employment,  and spiritual guidance.  Agency Name: Department of Social Services Address: [address] [place], [place] [address] Phone: [phone] Service(s) Offered: Child support services; child welfare services; food stamps; Medicaid; work first family assistance; and aid  with fuel,  rent, food and medicine.  Agency Name: Dietitian Address: [address], [place], [place] Phone: [phone] Website: [email or web address] Services Offered: Monday 10:00am-12:00, 8:00pm-9:00pm, and Friday 10:00am-12:00.  Agency Name: [employer] [facility] Address: [address] [place], [place] [address] Phone: [phone] Website: [email or web address] Service(s) Offered: Serves weekday meals, open from 11:30 am- 1:00 pm., and 6:30-7:30pm, Monday-Wednesday-Friday distributes food 3:30-6pm, Monday-Wednesday-Friday.  Agency Name: [facility] Address: [address] [place], [place] Phone: [phone] Website: [email or web address] Services Offered: Distributes food the 4th Saturday of the month, starting at 8:00 am  Agency Name: [facility] Address: [phone] [address], [place], [place] [address] Phone: [phone] Website: [email or web address] Service(s) Offered: Bread of life, weekly food pantry. Open Wednesdays from 10:00am-noon.  Agency Name: [facility] [employer] Address: [address] [place], [place], [place] Phone: [phone] Services Offered: Distributes food 9am-1pm, Monday-Thursday. Call for details.  Agency Name: [facility] Address: [address], [place], [place] [address] Phone: [phone] Website: [email or web address].[email or web address] Service(s) Offered: Games developer. Call for assistance.  Agency Name: [facility] Address: [address] [place], [place] [address] Phone: [phone] Service Offered: Emergency Food Pantry. Call for appointment.  Agency Name: [facility] Address: [address], [place], [place] [address] Phone: [phone] Website: [email or web address] Services Offered: Games developer. Call for details  Agency Name: [facility] Address: [address] [place], [place] Phone:  [phone] Website: [email or web address] Service(s) Offered: Emergency Food Pantry. Call for details.  Agency Name: Holiday representative Address: [address], [place], [place] [address] Phone: [phone] or [phone] Website: [email or web address] Service(s) Offered: Distribute food 9am-11:30 am, Tuesday-Friday, and 1-3:30pm, Monday-Friday. Food pantry Monday-Friday 1pm-3pm, fresh items, Mon.-Wed.-Fri.  Agency Name: [facility] Address: [address] [place], [place] [address] Phone: [phone] Website: www.[email or web address] Services Offered: Distribute food Tues and Sats from 9:00am-noon.  Closed 1st Saturday of each month. Call for details  Agency Name: [facility] [employer] Address: Reynaldo [address]ium [facility] [address] [place] [address] Phone: [phone]  Services Offered: Delivers meals every Thursday   Rent/Utility/Housing  Agency Name: [facility] Agency Address: [address] [place], [place] [address] Phone: [phone] Email: [email or web address] Website: [email or web address] Service(s) Offered: Housing services, self-sufficiency, congregate meal program, weatherization program, Field seismologist program, emergency food assistance,  housing counseling, home ownership program, wheels -towork program.  Agency Name: [facility] Address: [address], [place], [place] [address] Phone: [phone] (8a-4p) [phone] (8p- 10p) Email: [email or web address] Website: [email or web address] Service(s) Offered: A program for homeless and/or needy men that includes one-on-one counseling, life skills training and job rehabilitation.  Agency Name: [employer] [facility] [place] Address: [address] [place], [place] [address] Phone: [phone] Website: www.[email or web address] Service(s) Offered: Assistance to needy in emergency with  utility bills, heating fuel, and prescriptions. Shelter for homeless 7pm-7am. February 14, 2017 15  Agency Name: [facility] [place] (Developmentally Disabled) Address: [address], [place], [place] [address] Phone: [phone] Contact Person: Cathleen Corti Email: [email or web address] Website: [email or web address] Service(s) Offered: Helps individuals with developmental disabilities move from housing that is more restrictive to homes where they  can achieve greater independence and have more  opportunities.  Agency Name: [facility] Address: [address], [place], [place] [address] Phone: [phone] Email: [email or web address] Website: [email or web address].org Service(s) Offered: Provides affordable housing for low-income families, elderly, and disabled individuals. Offer a wide range of  programs and services, from financial planning to afterschool and summer programs.  Agency Name: Department of Social Services Address: [address] [place], [place] [address] Phone: [phone] Service(s) Offered: Child support services; child welfare services; food stamps; Medicaid; work first family assistance; and aid with fuel,  rent, food and medicine.  Agency Name: [facility] [place] [employer]. Address: [facility] [address] [place], [place]  [address] Phone: [phone] Website: www.familyabuseservices.org Service(s) Offered: 24 hour Crisis Line: [phone]; 24 hour Emergency Shelter; Transitional Housing; Support Groups; Scientist, physiological; [employer]; Hispanic Outreach: [phone];  Visitation Center: [phone].  Agency Name: [facility] [place]. Address: [address] [place], [place] [address] Phone: [phone] Service(s) Offered: CAP Services; Home and [employer]; Individual or Group Supports; Respite Care Non-Institutional Nursing;  Residential Supports; Respite Care and Personal Care  Services; Transportation; Family and Friends Night; Recreational Activities; Three Nutritious Meals/Snacks; Consultation with Registered Dietician; Twenty-four hour Registered Nurse Access; Daily and [employer]; Camp Green Leaves; Lolita for the [employer] (During Summer Months) Bingo Night (Every  Wednesday Night); Special Populations Dance Night  (Every Tuesday Night); Professional Hair Care Services.  Agency Name: [facility] Address: [address], [place], [place] [address] Phone: [phone] Contact Person: Jabier Mutton Website: [email or web address].Physicist, medical) Offered: Residential treatment facility for women; food and  clothing, educational & employment development and  transportation to work; Counsellor of financial skills;  parenting and family reunification; emotional and spiritual  support; transitional housing for program graduates.  Agency Name: [facility] Address: [address] [place], [place] [address] Phone: [phone] Email: [email or web address] Website: [email or web address].es Service(s) Offered: Public housing units for elderly, disabled, and low income people; housing choice vouchers for income eligible  applicants; shelter plus care vouchers; and Psychologist, clinical.  Agency Name: [facility] [employer] Address: [address], [place], [place] [address] Phone: [phone] Email: [email or web address] Website: www.habitatalamance.org  Service(s) Offered: Build houses for families in need of decent housing. Each adult in the family must invest 200 hours of labor on  someone else's house, work with volunteers to build their own house, attend classes on budgeting, home maintenance, yard care, and attend homeowner association meetings.  Agency Name: [facility] Address: [address] [place], [place] [address] Phone: [phone] Website:  www.rsli.org Service(s) Offered: Intermediate care facilities for intellectually delayed, Supervised Living in group homes for adults with developmental disabilities, Supervised Living for people who have dual diagnoses (MRMI), Independent Living, Supported Living, respite and a variety of CAP services, pre-vocational services, day supports, and [employer].  Agency Name: [place] [facility] [facility] Phone: [phone] Website: www.NCForeclosurePrevention.gov Service(s) Offered: Zero-interest, deferred loans to homeowners struggling to pay their mortgage. Call for more information.   Transportation Resources  Agency Name: [facility] Agency Address: [address] [place], [place] [address] Phone: [phone] Email: [email or web address] Website: www.[email or web address].org Service(s) Offered: Housing services, self-sufficiency, congregate meal program, weatherization program, Field seismologist program, emergency food assistance,  housing counseling, home ownership program, wheels-towork program.  Agency Name: [facility] [employer] [phone]) Address: [address], [place], [place] [address] Phone: [phone] Website: www.[email or web address]-[place].[email or web address] Service(s) Offered: Transportation for [employer], subscription and demand response; Dial-a-Ride for citizens 54 years of age or older.  Agency Name: Department of Social Services Address: [address] [place], [place] [address] Phone: [phone] Service(s) Offered: Child support services; child welfare services; food stamps; Medicaid; work first family assistance; and aid with fuel,  rent, food and medicine, transportation assistance.  Agency Name: Disabled [employer] (DAV) Transportation  Network Phone: [phone] Service(s) Offered: Transports veterans to the [facility] medical center. Call  forty-eight hours in advance and leave the name,  telephone  number, date, and time of appointment. Veteran will be  contacted by the driver the day before the appointment to  arrange a pick up point   Transportation Resources  Agency Name: [facility] Agency Address: [address] [place], [place] [address] Phone: [phone] Email: [email or web address] Website: www.[email or web address].org Service(s) Offered: Housing services, self-sufficiency, congregate meal program, weatherization program, Field seismologist program, emergency food assistance,  housing counseling, home ownership program, wheels-towork program.  Agency Name: [facility] [employer] [phone]) Address: [address], [place], [place] [address] Phone: [phone] Website: www.acta-[place].[email or web address] Service(s) Offered: Transportation for [employer], subscription and demand response; Dial-a-Ride for citizens 3 years of age or older.  Agency Name: Department of Social Services Address: [address] [place], [place] [address] Phone: [phone] Service(s) Offered: Child support services; child welfare services; food stamps; Medicaid; work first family assistance; and aid with fuel,  rent, food and medicine, transportation assistance.  Agency Name: Disabled [employer] (DAV) Transportation  Network Phone: [phone] Service(s) Offered: Transports veterans to the [facility] medical center. Call  forty-eight hours in advance and leave the name, telephone  number, date, and time of appointment. Veteran will be  contacted by the driver the day before the appointment to  arrange a pick up point    [employer] ACTA currently provides door to door services. ACTA connects with PART daily for services to [facility] [place]. ACTA also performs contract services to [employer] operates 27 vehicles, all but 3 mini-vans are equipped with lifts for special needs as well as the  general public. ACTA drivers are each CDL certified and trained in First Aid and CPR. ACTA was established in 2002 by [employer]  county Building services engineer. An independent Industrial/product designer. ACTA operates via Cytogeneticist with required Research scientist (physical sciences) from [place]. ACTA provides over 80,000 passenger trips each year, including [facility] and [employer] sites.  Call at least by 11 AM one business day prior to needing transportation  [employer].                      [place], [place] [address]     Office Hours: Monday-Friday  8 AM - 5 PM

## 2023-10-10 NOTE — TOC Initial Note (Signed)
Transition of Care [facility]) - Initial/Assessment Note    Patient Details  Name: Tami Hopkins MRN: [id number] Date of Birth: 08-29-1961  Transition of [facility]) CM/SW Contact:    Margarito Liner, LCSW Phone Number: 10/10/2023, 11:34 AM  Clinical Narrative:   Readmission prevention screen complete but CSW unable to enter the data. CSW met with patient. No supports at bedside. Daughter had been in the hallway and they had just gotten into an argument per staff. CSW introduced role and explained that discharge planning would be discussed. PCP is Ardyth Man, PA-C. Patient sometimes drives herself to appointments but has no one to take her other times. Transportation resources added to AVS. [facility] [address] No issues obtaining medications. Patient lives with a roommate and his girlfriend but said she cannot return there at discharge. CSW asked about plan and she said she is going to call people she knows to see if she can stay with them. CSW provided shelter resources. PT/OT evals pending. Patient told MD that the overdose was unintentional. She does not want CSW to give her daughter any information. No further concerns. CSW encouraged patient to contact CSW as needed. CSW will continue to follow patient for support and facilitate discharge once medically stable.              Expected Discharge Plan:  (TBD) Barriers to Discharge: Continued Medical Work up   Patient Goals and CMS Choice            Expected Discharge Plan and Services     Post Acute Care Choice: NA Living arrangements for the past 2 months: Single Family Home                                      Prior Living Arrangements/Services Living arrangements for the past 2 months: Single Family Home Lives with:: Roommate Patient language and need for interpreter reviewed:: Yes Do you feel safe going back to the place where you live?: Yes      Need for Family Participation in Patient Care:  Yes (Comment)     Criminal Activity/Legal Involvement Pertinent to Current Situation/Hospitalization: No - Comment as needed  Activities of Daily Living   ADL Screening (condition at time of admission) Independently performs ADLs?: No Does the patient have a NEW difficulty with bathing/dressing/toileting/self-feeding that is expected to last >3 days?: Yes (Initiates electronic notice to provider for possible OT consult) Does the patient have a NEW difficulty with getting in/out of bed, walking, or climbing stairs that is expected to last >3 days?: Yes (Initiates electronic notice to provider for possible PT consult) Does the patient have a NEW difficulty with communication that is expected to last >3 days?: No Is the patient deaf or have difficulty hearing?: No Does the patient have difficulty seeing, even when wearing glasses/contacts?: No Does the patient have difficulty concentrating, remembering, or making decisions?: Yes  Permission Sought/Granted                  Emotional Assessment Appearance:: Appears stated age Attitude/Demeanor/Rapport: Engaged, Apprehensive, Crying Affect (typically observed): Accepting, Tearful/Crying, Sad Orientation: : Oriented to Self, Oriented to Place, Oriented to  Time, Oriented to Situation Alcohol / Substance Use: Not Applicable Psych Involvement: Yes (comment)  Admission diagnosis:  Rhabdomyolysis [M62.82] Non-traumatic rhabdomyolysis [M62.82] Altered mental status, unspecified altered mental status type [R41.82] Patient Active Problem List  Diagnosis Date Noted   Drug overdose 10/07/2023   Acute respiratory failure with hypoxia (HCC) 10/07/2023   Aspiration pneumonia (HCC) 10/07/2023   AKI (acute kidney injury) (HCC) 10/07/2023   Pain in joint, shoulder region 12/28/2021   Unspecified inflammatory spondylopathy, lumbosacral region (HCC) 01/19/2019   DDD (degenerative disc disease), lumbar 11/26/2018   Cervicalgia 11/26/2018   Bad  taste in mouth 06/26/2018   Chronic pain syndrome 01/29/2018   Chronic nausea 11/19/2017   Gastroesophageal reflux disease with esophagitis 05/17/2017   Trigger finger of right hand 02/08/2017   Irritable bowel syndrome with both constipation and diarrhea 11/19/2016   Chronic allergic rhinitis due to animal hair and dander 10/08/2016   Chronic hip pain, right 10/08/2016   Chronic right-sided low back pain with right-sided sciatica 10/08/2016   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   High cholesterol 10/08/2016   Moderate persistent asthma without complication 10/08/2016   Overweight (BMI 25.0-29.9) 10/08/2016   Tobacco use disorder 10/08/2016   Neurosis, posttraumatic 02/28/2016   Endometriosis 08/18/2015   Chronic obstructive pulmonary disease (HCC) 07/18/2015   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 [place] [address] Phone: [phone] Fax: [phone]  [facility] [place], [place] - [address] [place] [address] Phone: [phone] Fax: [phone]     Social Drivers of Health (SDOH) Social History: SDOH Screenings   Food Insecurity: Food Insecurity Present (10/10/2023)  Housing: High Risk (10/09/2023)  Transportation Needs: No Transportation Needs (10/09/2023)  Utilities: Patient Declined (10/09/2023)  Depression (PHQ2-9): Medium Risk (04/30/2023)  Financial Resource Strain: Medium Risk (09/25/2022)   [facility], [facility]  Physical Activity: Insufficiently Active (01/13/2021)   Received from [facility], [facility]  Social Connections: Socially  Isolated (01/13/2021)   [facility] from [facility], [facility]  Stress: Stress Concern Present (01/13/2021)   [facility] from [facility], [facility]  Tobacco Use: High Risk (10/09/2023)   SDOH Interventions: Food Insecurity Interventions: Inpatient TOC, Other (Comment) (Resources added to AVS) Housing Interventions: Inpatient TOC, Other (Comment) (Resources added to AVS)   Readmission Risk Interventions     No data to display

## 2023-10-10 NOTE — Consult Note (Signed)
PHARMACY CONSULT NOTE - ELECTROLYTES  Pharmacy Consult for Electrolyte Monitoring and Replacement   Recent Labs: Potassium (mmol/L)  Date Value  10/10/2023 3.3 (L)   Magnesium (mg/dL)  Date Value  78/46/9629 1.9   Calcium (mg/dL)  Date Value  52/84/1324 8.3 (L)   Albumin (g/dL)  Date Value  40/07/2724 2.8 (L)   Phosphorus (mg/dL)  Date Value  36/64/4034 2.2 (L)   Sodium (mmol/L)  Date Value  10/10/2023 138   Height: 5\' 8"  (172.7 cm) Weight: 90.4 kg (199 lb 4.7 oz) IBW/kg (Calculated) : 63.9 Estimated Creatinine Clearance: 56.2 mL/min (A) (by C-G formula based on SCr of 1.19 mg/dL (H)).  Assessment  Tami Hopkins is a 62 y.o. female presenting with AMS. PMH significant for chronic pain syndrome (on Percocet, baclofen, and gabapentin), PTSD (on Klonopin), GERD, IBS, hyperlipidemia . Pharmacy has been consulted to monitor and replace electrolytes.  AKI on admission in setting of rhabdomyolysis (CK > 50k). Scr trending down.   Diet: PO MIVF: NS @ 125 mL/hr x 24 hours.  Pertinent medications: N/A  Goal of Therapy: Electrolytes within normal limits  Plan:  KCL 40 mEq x 3 PO.  F/u with AM labs.   Thank you for allowing pharmacy to be a part of this patient's care.  Ronnald Ramp, PharmD 10/10/2023 8:56 AM

## 2023-10-10 NOTE — Care Management Important Message (Signed)
Important Message  Patient Details  Name: Tami Hopkins MRN: [id number] Date of Birth: 22-Jun-1961   Important Message Given:  Yes - Medicare IM     Bernadette Hoit 10/10/2023, 9:52 AM

## 2023-10-10 NOTE — Progress Notes (Signed)
Pt is requesting that no info be given to her daughter at this time.

## 2023-10-10 NOTE — Evaluation (Signed)
Clinical/Bedside Swallow Evaluation Patient Details  Name: Tami Hopkins MRN: [id number] Date of Birth: [birth year]  Today's Date: 10/10/2023 Time: SLP Start Time (ACUTE ONLY): 1230 SLP Stop Time (ACUTE ONLY): 1330 SLP Time Calculation (min) (ACUTE ONLY): 60 min  Past Medical History:  Past Medical History:  Diagnosis Date   Anxiety    Asthma    Bursitis of hip bilateral    Hyperlipidemia    PTSD (post-traumatic stress disorder)    Scoliosis    Past Surgical History:  Past Surgical History:  Procedure Laterality Date   ABDOMINAL HYSTERECTOMY     CESAREAN SECTION     CHOLECYSTECTOMY     ENDOMETRIAL ABLATION     x3   NOSE SURGERY     HPI:  Pt is a 62 y.o. female with medical history significant of Multiple medical issues including Chronic pain syndrome (on Percocet, baclofen, and gabapentin), PTSD (on Klonopin), Chronic use of Opioids, Obesity, chronic right-sided low back pain with right-sided sciatica, scoliosis, GERD- Gastroesophageal reflux disease with esophagitis,  IBS, hyperlipidemia, chronic bronchitis; Sinusitis, recently given an inhaled steroid for her COPD and that has caused oral thrush(06/2023) who presents to the ED due to altered mental status.     History limited due to patient's altered mental status.  Her daughter at bedside is not able to provide additional history, but states that she has been worried about patient's medications, stating she often takes more than she is supposed to. Per chart review, EMS was called after patient's roommate found her on the bathroom floor.  Unclear how long patient was down on the bathroom floor.  Per MD note at admit: Patient presenting with suspected drug overdose, unclear if intentional or unintentional.  She is on multiple centrally acting medications at home including gabapentin, baclofen, Percocet, Klonopin, Buspar and Seroquel (all with recent refills).  UDS also positive for tricyclic antidepressants. With current home  regimen, she was at extremely high risk for an adverse event.   CXR: Abnormal consolidation/airspace opacity at the right lung base.  Head CT: no acute.    Assessment / Plan / Recommendation  Clinical Impression   Pt seen for BSE today. Pt awake, verbal and followed instructions. Making phone call to Dtr post session. Sitter present.   On RA, afebrile. WBC WNL.  Pt appears to present w/ functional oropharyngeal phase swallowing w/ No oropharyngeal phase dysphagia noted, No neuromuscular deficits noted. Pt consumed po trials w/ No overt, clinical s/s of aspiration during po trials.  Pt appears at reduced risk for aspiration following general aspiration precautions. However, pt does have challenging factors that could impact her oropharyngeal swallowing to include Pain/discomfort(chronic, and on multiple pain meds per chart notes), Baseline GERD, and deconditioning/weakness. These factors can increase risk for aspiration, dysphagia as well as decreased oral intake overall.  During po trials, pt consumed all consistencies w/ no overt coughing, decline in vocal quality, or change in respiratory presentation during/post trials. No decline in O2 sats. Oral phase appeared [facility] w/ timely bolus management, mastication, and control of bolus propulsion for A-P transfer for swallowing. Oral clearing achieved w/ all trial consistencies. NSG staff reported no deficits w/ oral intake. OM Exam appeared [facility] w/ no unilateral oral weakness noted. Speech Clear. Pt fed self w/ setup support. Belching x2+. Recommend continue a Regular consistency diet w/ well-Cut meats, moistened foods; Thin liquids -- monitor when drowsy and do not eat/drink if too sleepy or having pain. Reduce distractions during meals as needed. Recommend general aspiration  precautions. Positioning support as needed. Pills whole in Puree IF needed for easier swallowing. REFLUX Precautions d/t Baseline GERD. Education given on Pills in Puree; food  consistencies and easy to eat options; general aspiration precautions to pt. No further skilled ST services indicated at this time. MD/NSG to reconsult if any new needs arise. NSG updated, agreed. MD updated. Recommend Dietician f/u for support. SLP Visit Diagnosis: Dysphagia, unspecified (R13.10)    Aspiration Risk   (reduced when following general aspiration precautions; REFLUX precs.)    Diet Recommendation   Thin;Age appropriate regular (moistened, cut foods) = continue a Regular consistency diet w/ well-Cut meats, moistened foods; Thin liquids -- monitor when drowsy and do not eat/drink if too sleepy or having pain. Reduce distractions during meals as needed. Recommend general aspiration precautions. Positioning support as needed. REFLUX Precautions d/t Baseline GERD  Medication Administration: Whole meds with liquid (vs in a puree if easier)    Other  Recommendations Recommended Consults: Consider GI evaluation (Reflux precautions) Oral Care Recommendations: Oral care BID;Patient independent with oral care    Recommendations for follow up therapy are one component of a multi-disciplinary discharge planning process, led by the attending physician.  Recommendations may be updated based on patient status, additional functional criteria and insurance authorization.  Follow up Recommendations No SLP follow up      Assistance Recommended at Discharge  PRN  Functional Status Assessment Patient has had a recent decline in their functional status and demonstrates the ability to make significant improvements in function in a reasonable and predictable amount of time.  Frequency and Duration  (n/a)   (n/a)       Prognosis Prognosis for improved oropharyngeal function: Good Barriers to Reach Goals: Time post onset;Severity of deficits;Medication (GERD) Barriers/Prognosis Comment: chronic comorbidities; pain/pain meds      Swallow Study   General Date of Onset: 10/07/23 HPI: Pt is a 62  y.o. female with medical history significant of Multiple medical issues including Chronic pain syndrome (on Percocet, baclofen, and gabapentin), PTSD (on Klonopin), Chronic use of Opioids, Obesity, chronic right-sided low back pain with right-sided sciatica, scoliosis, GERD- Gastroesophageal reflux disease with esophagitis,  IBS, hyperlipidemia, chronic bronchitis; Sinusitis, recently given an inhaled steroid for her COPD and that has caused oral thrush(06/2023) who presents to the ED due to altered mental status.     History limited due to patient's altered mental status.  Her daughter at bedside is not able to provide additional history, but states that she has been worried about patient's medications, stating she often takes more than she is supposed to. Per chart review, EMS was called after patient's roommate found her on the bathroom floor.  Unclear how long patient was down on the bathroom floor.  Per MD note at admit: Patient presenting with suspected drug overdose, unclear if intentional or unintentional.  She is on multiple centrally acting medications at home including gabapentin, baclofen, Percocet, Klonopin, Buspar and Seroquel (all with recent refills).  UDS also positive for tricyclic antidepressants. With current home regimen, she was at extremely high risk for an adverse event.   CXR: Abnormal consolidation/airspace opacity at the right lung base.  Head CT: no acute. Type of Study: Bedside Swallow Evaluation Previous Swallow Assessment: none Diet Prior to this Study: Regular;Thin liquids (Level 0) Temperature Spikes Noted: No (wbc 7.9) Respiratory Status: Room air History of Recent Intubation: No Behavior/Cognition: Alert;Cooperative;Pleasant mood Oral Cavity Assessment: Within Functional Limits Oral Care Completed by SLP: Recent completion by staff Oral Cavity -  Dentition: Adequate natural dentition Vision: Functional for self-feeding Self-Feeding Abilities: Able to feed self;Needs set  up Patient Positioning: Upright in bed (needed min support) Baseline Vocal Quality: Normal Volitional Cough: Strong Volitional Swallow: Able to elicit    Oral/Motor/Sensory Function Overall Oral Motor/Sensory Function: Within functional limits   Ice Chips Ice chips: Not tested   Thin Liquid Thin Liquid: Within functional limits Presentation: Self Fed;Straw (~6 ozs total)    Nectar Thick Nectar Thick Liquid: Not tested   Honey Thick Honey Thick Liquid: Not tested   Puree Puree: Not tested   Solid     Solid: Within functional limits Presentation: Self Fed;Spoon (several trials of meats, potatoes from lunch meal)        Jerilynn Som, MS, CCC-SLP Speech Language Pathologist Rehab Services; [facility] [place] [phone] (ascom) Leonte Horrigan 10/10/2023,5:00 PM

## 2023-10-11 ENCOUNTER — Institutional Professional Consult (permissible substitution): Payer: Medicare HMO | Admitting: Pulmonary Disease

## 2023-10-11 DIAGNOSIS — T50901A Poisoning by unspecified drugs, medicaments and biological substances, accidental (unintentional), initial encounter: Secondary | ICD-10-CM | POA: Diagnosis not present

## 2023-10-11 LAB — CBC
HCT: 32.6 % — ABNORMAL LOW (ref 36.0–46.0)
Hemoglobin: 11.2 g/dL — ABNORMAL LOW (ref 12.0–15.0)
MCH: 32.3 pg (ref 26.0–34.0)
MCHC: 34.4 g/dL (ref 30.0–36.0)
MCV: 93.9 fL (ref 80.0–100.0)
Platelets: 187 10*3/uL (ref 150–400)
RBC: 3.47 MIL/uL — ABNORMAL LOW (ref 3.87–5.11)
RDW: 13.6 % (ref 11.5–15.5)
WBC: 6.3 10*3/uL (ref 4.0–10.5)
nRBC: 0 % (ref 0.0–0.2)

## 2023-10-11 LAB — HEPATIC FUNCTION PANEL
ALT: 143 U/L — ABNORMAL HIGH (ref 0–44)
AST: 185 U/L — ABNORMAL HIGH (ref 15–41)
Albumin: 2.9 g/dL — ABNORMAL LOW (ref 3.5–5.0)
Alkaline Phosphatase: 55 U/L (ref 38–126)
Bilirubin, Direct: <0.1 mg/dL (ref 0.0–0.2)
Total Bilirubin: 0.7 mg/dL (ref <1.2)
Total Protein: 5.6 g/dL — ABNORMAL LOW (ref 6.5–8.1)

## 2023-10-11 LAB — MAGNESIUM: Magnesium: 1.6 mg/dL — ABNORMAL LOW (ref 1.7–2.4)

## 2023-10-11 LAB — BASIC METABOLIC PANEL
Anion gap: 7 (ref 5–15)
BUN: 12 mg/dL (ref 8–23)
CO2: 23 mmol/L (ref 22–32)
Calcium: 8.6 mg/dL — ABNORMAL LOW (ref 8.9–10.3)
Chloride: 109 mmol/L (ref 98–111)
Creatinine, Ser: 0.99 mg/dL (ref 0.44–1.00)
GFR, Estimated: >60 mL/min (ref >60)
Glucose, Bld: 97 mg/dL (ref 70–99)
Potassium: 3.7 mmol/L (ref 3.5–5.1)
Sodium: 139 mmol/L (ref 135–145)

## 2023-10-11 LAB — CK: Total CK: 4539 U/L — ABNORMAL HIGH (ref 38–234)

## 2023-10-11 LAB — PHOSPHORUS: Phosphorus: 1.7 mg/dL — ABNORMAL LOW (ref 2.5–4.6)

## 2023-10-11 MED ORDER — POTASSIUM CHLORIDE CRYS ER 20 MEQ PO TBCR
40.0000 meq | EXTENDED_RELEASE_TABLET | Freq: Once | ORAL | Status: DC
Start: 2023-10-11 — End: 2023-10-11

## 2023-10-11 MED ORDER — K PHOS MONO-SOD PHOS DI & MONO 155-852-130 MG PO TABS
500.0000 mg | ORAL_TABLET | Freq: Two times a day (BID) | ORAL | Status: DC
  Filled 2023-10-11: qty 2

## 2023-10-11 MED ORDER — DOXYCYCLINE HYCLATE 100 MG PO TABS
100.0000 mg | ORAL_TABLET | Freq: Two times a day (BID) | ORAL | Status: DC
  Administered 2023-10-11 – 2023-10-14 (×7): 100 mg via ORAL
  Filled 2023-10-11 (×7): qty 1

## 2023-10-11 MED ORDER — MAGNESIUM SULFATE 2 GM/50ML IV SOLN
2.0000 g | Freq: Once | INTRAVENOUS | Status: AC
  Administered 2023-10-11: 2 g via INTRAVENOUS
  Filled 2023-10-11: qty 50

## 2023-10-11 MED ORDER — SODIUM CHLORIDE 0.9 % IV SOLN
INTRAVENOUS | Status: AC
Start: 2023-10-11 — End: 2023-10-12

## 2023-10-11 MED ORDER — MELATONIN 5 MG PO TABS
5.0000 mg | ORAL_TABLET | Freq: Once | ORAL | Status: AC
  Administered 2023-10-11: 5 mg via ORAL
  Filled 2023-10-11: qty 1

## 2023-10-11 MED ORDER — K PHOS MONO-SOD PHOS DI & MONO 155-852-130 MG PO TABS
500.0000 mg | ORAL_TABLET | Freq: Three times a day (TID) | ORAL | Status: AC
  Administered 2023-10-11 (×3): 500 mg via ORAL
  Filled 2023-10-11 (×3): qty 2

## 2023-10-11 NOTE — Progress Notes (Signed)
Physical Therapy Treatment Patient Details Name: Tami Hopkins MRN: [id number] DOB: 12-01-1960 Today's Date: 10/11/2023   History of Present Illness 62 y/o female presented to ED on 10/07/23 for AMS 2/2 possible drug overdose. Admitted for drug overdose, rhabdomyolysis, and aspiration pneumonia. PMH: chronic pain syndrome, PTSD, COPD    PT Comments  Pt was supine in bed with sitter at bedside. Pt is alert however poor insight of situation and reason for hospitalization. Pt needs encouragement to participate but once agreeable does perform all desired task with +2 assistance due to pt's impulsivity and poor overall safety awareness. Pt was able to exit L side of bed, stand to RW, and tolerate ambulation to door and back. Pt 's poor safety awareness and impulsivity makes her at high risk of falls. She c/o R foot pain but did not formally rate pain. Per chart/TOC notes pt currently refusing rehab at DC. She will benefit from continued skilled PT to maximize her independence and safety with all ADLs. If pt does lect to DC home, highly recommend 24/7 supervision/assist.      If plan is discharge home, recommend the following: A lot of help with walking and/or transfers;A lot of help with bathing/dressing/bathroom;Assistance with cooking/housework;Direct supervision/assist for medications management;Direct supervision/assist for financial management;Assist for transportation;Help with stairs or ramp for entrance     Equipment Recommendations  Rolling walker (2 wheels);BSC/3in1;Wheelchair (measurements PT);Wheelchair cushion (measurements PT) (if pt continues to refused STR at DC)       Precautions / Restrictions Precautions Precautions: Fall Restrictions Weight Bearing Restrictions Per Provider Order: No     Mobility  Bed Mobility Overal bed mobility: Needs Assistance Bed Mobility: Supine to Sit  Supine to sit: Min assist, +2 for safety/equipment, Mod assist Sit to supine: Mod assist, +2  for safety/equipment General bed mobility comments: pt is A and O x 4    Transfers Overall transfer level: Needs assistance Equipment used: Rolling walker (2 wheels) Transfers: Sit to/from Stand Sit to Stand: Min assist, +2 safety/equipment  General transfer comment: pt was able to stand with min assist of 1 but 2nd person due to pt's impulsivity and anxiety    Ambulation/Gait Ambulation/Gait assistance: Min assist, +2 safety/equipment Gait Distance (Feet): 15 Feet Assistive device: Rolling walker (2 wheels) Gait Pattern/deviations: Step-through pattern, Trunk flexed Gait velocity: decreased  General Gait Details: Pt was able to ambulate ~ 15 ft with +2 assistance for safety due to pt's impulsivity and poor safety awarness.   Balance Overall balance assessment: Needs assistance Sitting-balance support: Feet supported, Bilateral upper extremity supported Sitting balance-Leahy Scale: Fair     Standing balance support: Bilateral upper extremity supported, During functional activity Standing balance-Leahy Scale: Poor Standing balance comment: pt is at high risk of falls mostly due to impulsivity, cognition, and very poor safety awareness    Cognition Arousal: Alert Behavior During Therapy: Anxious, Impulsive Overall Cognitive Status: Impaired/Different from baseline Area of Impairment: Orientation, Attention, Memory, Following commands, Safety/judgement, Awareness, Problem solving    Orientation Level: Disoriented to, Situation, Time Current Attention Level: Sustained Memory: Decreased short-term memory Following Commands: Follows one step commands with increased time Safety/Judgement: Decreased awareness of deficits   Problem Solving: Slow processing, Decreased initiation, Difficulty sequencing, Requires verbal cues, Requires tactile cues                 Pertinent Vitals/Pain Pain Assessment Pain Assessment: 0-10 Pain Score: 8  Pain Location:  (RLE pain) Pain  Descriptors / Indicators: Grimacing, Guarding, Burning Pain Intervention(s): Limited  activity within patient's tolerance, Monitored during session, Premedicated before session, Repositioned     PT Goals (current goals can now be found in the care plan section) Acute Rehab PT Goals Patient Stated Goal: none stated Progress towards PT goals: Progressing toward goals    Frequency    Min 1X/week       Co-evaluation     PT goals addressed during session: Mobility/safety with mobility;Balance;Proper use of DME;Strengthening/ROM;Other (comment)        AM-PAC PT "6 Clicks" Mobility   Outcome Measure  Help needed turning from your back to your side while in a flat bed without using bedrails?: A Lot Help needed moving from lying on your back to sitting on the side of a flat bed without using bedrails?: A Lot Help needed moving to and from a bed to a chair (including a wheelchair)?: A Lot Help needed standing up from a chair using your arms (e.g., wheelchair or bedside chair)?: A Lot Help needed to walk in hospital room?: A Lot Help needed climbing 3-5 steps with a railing? : A Lot 6 Click Score: 12    End of Session   Activity Tolerance: Patient tolerated treatment well Patient left: in bed;with call bell/phone within reach;with bed alarm set Nurse Communication: Mobility status PT Visit Diagnosis: Unsteadiness on feet (R26.81);Muscle weakness (generalized) (M62.81);Difficulty in walking, not elsewhere classified (R26.2)     Time: 9562-1308 PT Time Calculation (min) (ACUTE ONLY): 14 min  Charges:    $Gait Training: 8-22 mins PT General Charges $$ ACUTE PT VISIT: 1 Visit                    Jetta Lout PTA 10/11/23, 4:08 PM

## 2023-10-11 NOTE — Consult Note (Signed)
PHARMACY CONSULT NOTE - ELECTROLYTES  Pharmacy Consult for Electrolyte Monitoring and Replacement   Recent Labs: Potassium (mmol/L)  Date Value  10/11/2023 3.7   Magnesium (mg/dL)  Date Value  30/86/5784 1.6 (L)   Calcium (mg/dL)  Date Value  69/62/9528 8.6 (L)   Albumin (g/dL)  Date Value  41/32/4401 2.9 (L)   Phosphorus (mg/dL)  Date Value  02/72/5366 1.7 (L)   Sodium (mmol/L)  Date Value  10/11/2023 139   Height: 5\' 8"  (172.7 cm) Weight: 90.8 kg (200 lb 2.8 oz) IBW/kg (Calculated) : 63.9 Estimated Creatinine Clearance: 67.7 mL/min (by C-G formula based on SCr of 0.99 mg/dL).  Assessment  Tami Hopkins is a 62 y.o. female presenting with AMS. PMH significant for chronic pain syndrome (on Percocet, baclofen, and gabapentin), PTSD (on Klonopin), GERD, IBS, hyperlipidemia . Pharmacy has been consulted to monitor and replace electrolytes.  AKI on admission in setting of rhabdomyolysis (CK > 50k). Scr trending down.   Diet: PO, regular MIVF: NS @ 125 mL/hr x 24 hours.  Pertinent medications: N/A  Goal of Therapy: Electrolytes WNL 10/11/2023 K = 3.7 Mg = 1.6 Phos = 1.7  Plan:  KCL 40 mEq x 1 PO Mg sulfate 2g IV x 1 Kphos 500 mg PO x 1 F/u BMP, Mg, Phos with AM labs  Thank you for allowing pharmacy to be a part of this patient's care.  Effie Shy, PharmD Pharmacy Resident  10/11/2023 7:31 AM

## 2023-10-11 NOTE — Progress Notes (Signed)
[facility] Progress Note  Patient: Tami Hopkins    WUJ:[id number]  DOA: 10/07/2023     Date of Service: the patient was seen and examined on 10/11/2023  Chief Complaint  Patient presents with   Altered Mental Status   Brief hospital course: MACKINZEE ADERHOLT is a 62 y.o. female with medical history significant of chronic pain syndrome (on Percocet, baclofen, and gabapentin), PTSD (on Klonopin), GERD, IBS, hyperlipidemia, who presents to the ED due to altered mental status.   History limited due to patient's altered mental status.  Her daughter at bedside is not able to provide additional history, but states that she has been worried about patient's medications, stating she often takes more than she is supposed to.   Per chart review, EMS was called after patient's roommate found her on the bathroom floor.  Unclear how long patient was down on the bathroom floor and she is not able to tell me what led to her being there.  She cannot tell me if she took extra doses of any of her medications.   ED course: On arrival to the ED, patient was normotensive at 126/92 with heart rate of 115.  She was saturating at 92% on room air but subsequently desaturated to 87% and was placed on 3 L with improvement.  She was tachycardic up to 115.  She was afebrile at 98.4.  Initial workup notable for VBG with pH of 7.37, pCO2 of 38.  CBC with WBC of 15.1, hemoglobin of 16.4.  CK elevated above 50,000.  CMP with potassium of 3.1, bicarb 20, anion gap 17, AST 710, ALT 255, creatinine 1.87 and GFR of 30.  Lactic acid 3.0.  UDS positive for benzos, opioids, and tricyclic's.  UA with hematuria but no RBC; many bacteria, calcium oxalate, and WBC clumps noted.  Alcohol level negative.  Chest x-ray with consolidation in the right lung base.  CT head negative.  CT renal stone study with no obstructing stones.  Patient started on IV fluids and levofloxacin.  [facility] contacted for admission.   Assessment and Plan:  #  Drug overdose Patient presenting with suspected drug overdose, unclear if intentional or unintentional.  She is on multiple centrally acting medications at home including gabapentin, baclofen, Percocet, Klonopin, Buspar and Seroquel (all with recent refills).  UDS also positive for tricyclic antidepressants. With current home regimen, she was at extremely high risk for an adverse event such as this.  - IV fluids as ordered - Hold home regimen - Monitor closely for withdrawal from Percocet, baclofen and Klonopin Follow psych consult  # Rhabdomyolysis In the setting of unknown downtime. CK 50K--31k ---18k--8k--4529 trending down - IV fluids as ordered - Recheck CK in the a.m.   # Acute respiratory failure with hypoxia.  Resolved Patient was noted to be hypoxic as low as 87%.  Likely multifactorial in the setting of encephalopathy and suspected aspiration pneumonia.  Given history of COPD, that may be contributing as well although no wheezing on examination. -Supplemental O2 inhalation weaned off, currently saturating well on room air.    # Aspiration pneumonia Chest x-ray notable for right lower lobe and possible right middle lobe opacity and in the setting of drug overdose, most likely aspiration related.  History of significant penicillin allergy and no prior history of cephalosporin trial. -s/p Levofloxacin, renal dose x 1 as per pharmacy  12/20 started doxycycline 100 mg p.o. twice daily for 5 days - Aspiration precautions   # AKI (acute kidney  injury) Resolved  In the setting of dehydration and rhabdomyolysis. -Continue IV fluids - Hold home nephrotoxic agents - Strict in and out - Repeat BMP in the a.m. Creatinine 0.99 improving   # Transaminitis, monitor LFTs.  # Hypokalemia, potassium repleted. # Magnesium, mag repleted. # Hypophosphatemia, continue to monitor Monitor electrolytes and replete as needed.   # Chronic obstructive pulmonary disease History of COPD at high  risk of acute exacerbation in the setting of aspiration pneumonia - DuoNebs every 6 hours as needed - Hold home bronchodilators - Hold off on steroids at this time   # Chronic pain syndrome - Hold home Percocet, Gabapentin and Baclofen given lethargy and altered mental status - Monitor closely for evidence of withdrawal 12/18 started oxycodone 5 mg p.o. every 6 hourly as needed and morphine IV as needed.  Mental status improved.   # Post-traumatic stress disorder Patient is currently on BuSpar, Klonopin, and prazosin. - Hold home regimen in the setting of lethargy and altered mental status - CIWA monitoring given high risk for withdrawal Psych consulted  # Renal cyst, left.  Incidental finding  Left Bosniak I benign renal cyst measuring 1.9 cm. No follow-up imaging is recommended.   Body mass index is 30.44 kg/m.  Interventions:  Diet: Regular diet DVT Prophylaxis: Subcutaneous Lovenox   Advance goals of care discussion: Full code  Family Communication: family was not present at bedside, at the time of interview.  Patient is oriented to herself only  and very confused   Disposition:  Pt is from Home, admitted with drug overdose, respiratory failure, rhabdomyolysis, still has elevated CK and electrolyte imbalance, On IV fluids , which precludes a safe discharge. Discharge to home, when stable, may need few days to improve.  Subjective: No significant events overnight, patient was resting comfortably, patient wanted to go home but she is not able to get up out of the bed and walk. Patient stated that she is feeling better after getting a bath and it was done on the bed. Patient was explained that she cannot go home until she is ambulatory. She verbalized understanding.  Denied any specific complaints.   Physical Exam: General: NAD, lying comfortably Appear in no distress, affect depressed Eyes: PERRLA ENT: Oral Mucosa Clear, moist  Neck: no JVD,  Cardiovascular: S1  and S2 Present, no Murmur,  Respiratory: Equal air entry bilaterally, no crackles, No wheezing Abdomen: Bowel Sound present, Soft and no tenderness,  Skin: no rashes Extremities: no Pedal edema, no calf tenderness Neurologic: without any new focal findings Gait not checked due to patient safety concerns  Vitals:   10/11/23 0638 10/11/23 0708 10/11/23 1122 10/11/23 1645  BP:  133/85 128/83 120/77  Pulse:  83 88 80  Resp:  16 18 16   Temp:  98.2 F (36.8 C) 98.3 F (36.8 C) 98.7 F (37.1 C)  TempSrc:      SpO2:  97% 96% 98%  Weight: 90.8 kg     Height:        Intake/Output Summary (Last 24 hours) at 10/11/2023 1708 Last data filed at 10/11/2023 1601 Gross per 24 hour  Intake 720 ml  Output 4275 ml  Net -3555 ml   Filed Weights   10/07/23 2123 10/10/23 0426 10/11/23 0638  Weight: 91 kg 90.4 kg 90.8 kg    Data Reviewed: I have personally reviewed and interpreted daily labs, tele strips, imagings as discussed above. I reviewed all nursing notes, pharmacy notes, vitals, pertinent old records I have discussed  plan of care as described above with RN and patient/family.  CBC: Recent Labs  Lab 10/07/23 1552 10/08/23 0452 10/09/23 0859 10/10/23 0637 10/11/23 0413  WBC 15.1* 12.0* 8.6 7.9 6.3  NEUTROABS 12.9*  --   --   --   --   HGB 16.4* 13.7 11.8* 10.9* 11.2*  HCT 48.4* 40.6 36.1 32.2* 32.6*  MCV 93.6 94.6 97.8 93.6 93.9  PLT 279 223 150 168 187   Basic Metabolic Panel: Recent Labs  Lab 10/07/23 1552 10/08/23 0452 10/09/23 0859 10/10/23 0637 10/11/23 0413  NA 135 136 135 138 139  K 3.1* 2.9* 3.3* 3.3* 3.7  CL 98 106 109 112* 109  CO2 20* 21* 20* 18* 23  GLUCOSE 155* 119* 112* 106* 97  BUN 24* 27* 26* 16 12  CREATININE 1.87* 1.60* 1.45* 1.19* 0.99  CALCIUM 9.8 8.0* 8.2* 8.3* 8.6*  MG 2.1 1.5* 2.2 1.9 1.6*  PHOS  --  3.1 3.0 2.2* 1.7*    Studies: No results found.   Scheduled Meds:  calcium carbonate  1 tablet Oral BID WC   Chlorhexidine Gluconate  Cloth  6 each Topical Daily   doxycycline  100 mg Oral Q12H   feeding supplement  237 mL Oral BID BM   folic acid  1 mg Oral Daily   heparin injection (subcutaneous)  5,000 Units Subcutaneous Q8H   multivitamin with minerals  1 tablet Oral Daily   oxymetazoline  1 spray Each Nare BID   pantoprazole  40 mg Oral Daily   phosphorus  500 mg Oral TID   sodium bicarbonate  650 mg Oral TID   sodium chloride flush  3 mL Intravenous Q12H   thiamine  100 mg Oral Daily   Or   thiamine  100 mg Intravenous Daily   Continuous Infusions:  sodium chloride 125 mL/hr at 10/11/23 1601   PRN Meds: acetaminophen **OR** acetaminophen, ipratropium-albuterol, morphine injection, ondansetron **OR** ondansetron (ZOFRAN) IV, oxyCODONE  Time spent: 40 minutes  Author: Gillis Santa. MD [facility] 10/11/2023 5:08 PM  To reach On-call, see care teams to locate the attending and reach out to them via [email or web address]. If 7PM-7AM, please contact night-coverage If you still have difficulty reaching the attending provider, please page the [facility] (Director on Call) for [facility] on amion for assistance.

## 2023-10-11 NOTE — TOC CM/SW Note (Signed)
Patient is not able to walk the distance required to go the bathroom, or he/she is unable to safely negotiate stairs required to access the bathroom.  A 3in1 BSC will alleviate this problem  

## 2023-10-11 NOTE — Plan of Care (Signed)

## 2023-10-11 NOTE — TOC Progression Note (Addendum)
Transition of Care [facility]) - Progression Note    Patient Details  Name: Tami Hopkins MRN: [id number] Date of Birth: 1961/03/15  [facility] CM/SW Contact  Margarito Liner, LCSW Phone Number: 10/11/2023, 11:05 AM  Clinical Narrative: Patient is not interested in SNF placement at this time but is agreeable to home health. She confirmed she is able to return to her previous home at discharge. She is agreeable to DME recommendations for RW and 3-in-1. CSW went back to the room to give list for home health agencies but she was getting a bath. CSW will try again later. Patient will reach out to her daughter about bringing her key. Patient stated she might need a ride home and cannot pay for a cab.    1:53 pm: Patient's first preference is [facility]. Liaison is reviewing referral. CSW sent secure chat to MD requesting DME orders for RW and 3-in-1.  2:55 pm: CSW ordered DME through [employer].  3:32 pm: [facility] unable to accept. [facility] cannot provide OT. Iantha Fallen would not be able to start until Thursday or Friday of next week because of the holiday. [facility] is reviewing.  4:46 pm: [facility] has accepted referral for PT, OT, RN.  Expected Discharge Plan: Home w Home Health Services Barriers to Discharge: Continued Medical Work up  Expected Discharge Plan and Services     Post Acute Care Choice: NA Living arrangements for the past 2 months: Single Family Home                                       Social Determinants of Health (SDOH) Interventions SDOH Screenings   Food Insecurity: Food Insecurity Present (10/10/2023)  Housing: High Risk (10/09/2023)  Transportation Needs: No Transportation Needs (10/09/2023)  Utilities: Patient Declined (10/09/2023)  Depression (PHQ2-9): Medium Risk (04/30/2023)  Financial Resource Strain: Medium Risk (09/25/2022)   Received from [facility], [facility]  Physical Activity: Insufficiently  Active (01/13/2021)   Received from [facility], [facility]  Social Connections: Socially Isolated (01/13/2021)   Received from [facility], [facility]  Stress: Stress Concern Present (01/13/2021)   Received from [facility], [facility]  Tobacco Use: High Risk (10/09/2023)    Readmission Risk Interventions     No data to display

## 2023-10-12 ENCOUNTER — Inpatient Hospital Stay: Payer: Medicare HMO

## 2023-10-12 DIAGNOSIS — T50901A Poisoning by unspecified drugs, medicaments and biological substances, accidental (unintentional), initial encounter: Secondary | ICD-10-CM | POA: Diagnosis not present

## 2023-10-12 LAB — BASIC METABOLIC PANEL
Anion gap: 8 (ref 5–15)
BUN: 12 mg/dL (ref 8–23)
CO2: 25 mmol/L (ref 22–32)
Calcium: 8.3 mg/dL — ABNORMAL LOW (ref 8.9–10.3)
Chloride: 106 mmol/L (ref 98–111)
Creatinine, Ser: 0.86 mg/dL (ref 0.44–1.00)
GFR, Estimated: >60 mL/min (ref >60)
Glucose, Bld: 115 mg/dL — ABNORMAL HIGH (ref 70–99)
Potassium: 2.8 mmol/L — ABNORMAL LOW (ref 3.5–5.1)
Sodium: 139 mmol/L (ref 135–145)

## 2023-10-12 LAB — CULTURE, BLOOD (ROUTINE X 2)
Culture: NO GROWTH
Special Requests: ADEQUATE

## 2023-10-12 LAB — CBC
HCT: 32.6 % — ABNORMAL LOW (ref 36.0–46.0)
Hemoglobin: 11.2 g/dL — ABNORMAL LOW (ref 12.0–15.0)
MCH: 31.9 pg (ref 26.0–34.0)
MCHC: 34.4 g/dL (ref 30.0–36.0)
MCV: 92.9 fL (ref 80.0–100.0)
Platelets: 187 10*3/uL (ref 150–400)
RBC: 3.51 MIL/uL — ABNORMAL LOW (ref 3.87–5.11)
RDW: 13.4 % (ref 11.5–15.5)
WBC: 5.5 10*3/uL (ref 4.0–10.5)
nRBC: 0 % (ref 0.0–0.2)

## 2023-10-12 LAB — PHOSPHORUS: Phosphorus: 3 mg/dL (ref 2.5–4.6)

## 2023-10-12 LAB — CK: Total CK: 2380 U/L — ABNORMAL HIGH (ref 38–234)

## 2023-10-12 LAB — MAGNESIUM: Magnesium: 1.7 mg/dL (ref 1.7–2.4)

## 2023-10-12 MED ORDER — CALCIUM CARBONATE ANTACID 500 MG PO CHEW
1.0000 | CHEWABLE_TABLET | Freq: Three times a day (TID) | ORAL | Status: DC | PRN
  Administered 2023-10-12: 200 mg via ORAL

## 2023-10-12 MED ORDER — GABAPENTIN 300 MG PO CAPS
300.0000 mg | ORAL_CAPSULE | Freq: Three times a day (TID) | ORAL | Status: DC
Start: 2023-10-12 — End: 2023-10-14
  Administered 2023-10-12 – 2023-10-14 (×6): 300 mg via ORAL
  Filled 2023-10-12 (×6): qty 1

## 2023-10-12 MED ORDER — SODIUM CHLORIDE 0.9 % IV SOLN: INTRAVENOUS | Status: AC

## 2023-10-12 MED ORDER — MAGNESIUM SULFATE 2 GM/50ML IV SOLN
2.0000 g | Freq: Once | INTRAVENOUS | Status: AC
  Administered 2023-10-12: 2 g via INTRAVENOUS
  Filled 2023-10-12: qty 50

## 2023-10-12 MED ORDER — POTASSIUM CHLORIDE CRYS ER 20 MEQ PO TBCR
40.0000 meq | EXTENDED_RELEASE_TABLET | Freq: Three times a day (TID) | ORAL | Status: AC
  Administered 2023-10-12 (×3): 40 meq via ORAL
  Filled 2023-10-12 (×3): qty 2

## 2023-10-12 MED ORDER — OXYCODONE-ACETAMINOPHEN 5-325 MG PO TABS: 1.0000 | ORAL_TABLET | Freq: Four times a day (QID) | ORAL | Status: DC | PRN

## 2023-10-12 NOTE — Plan of Care (Signed)
  Problem: Education: Goal: Knowledge of General Education information will improve Description: Including pain rating scale, medication(s)/side effects and non-pharmacologic comfort measures Outcome: Not Progressing   Problem: Health Behavior/Discharge Planning: Goal: Ability to manage health-related needs will improve Outcome: Not Progressing   

## 2023-10-12 NOTE — Progress Notes (Signed)
[facility] Progress Note  Patient: Tami Hopkins    WUJ:[id number]  DOA: 10/07/2023     Date of Service: the patient was seen and examined on 10/12/2023  Chief Complaint  Patient presents with   Altered Mental Status   Brief hospital course: Tami Hopkins is a 62 y.o. female with medical history significant of chronic pain syndrome (on Percocet, baclofen, and gabapentin), PTSD (on Klonopin), GERD, IBS, hyperlipidemia, who presents to the ED due to altered mental status.   History limited due to patient's altered mental status.  Her daughter at bedside is not able to provide additional history, but states that she has been worried about patient's medications, stating she often takes more than she is supposed to.   Per chart review, EMS was called after patient's roommate found her on the bathroom floor.  Unclear how long patient was down on the bathroom floor and she is not able to tell me what led to her being there.  She cannot tell me if she took extra doses of any of her medications.   ED course: On arrival to the ED, patient was normotensive at 126/92 with heart rate of 115.  She was saturating at 92% on room air but subsequently desaturated to 87% and was placed on 3 L with improvement.  She was tachycardic up to 115.  She was afebrile at 98.4.  Initial workup notable for VBG with pH of 7.37, pCO2 of 38.  CBC with WBC of 15.1, hemoglobin of 16.4.  CK elevated above 50,000.  CMP with potassium of 3.1, bicarb 20, anion gap 17, AST 710, ALT 255, creatinine 1.87 and GFR of 30.  Lactic acid 3.0.  UDS positive for benzos, opioids, and tricyclic's.  UA with hematuria but no RBC; many bacteria, calcium oxalate, and WBC clumps noted.  Alcohol level negative.  Chest x-ray with consolidation in the right lung base.  CT head negative.  CT renal stone study with no obstructing stones.  Patient started on IV fluids and levofloxacin.  TRH contacted for admission.   Assessment and Plan:  #  Drug overdose Patient presenting with suspected drug overdose, unclear if intentional or unintentional.  She is on multiple centrally acting medications at home including gabapentin, baclofen, Percocet, Klonopin, Buspar and Seroquel (all with recent refills).  UDS also positive for tricyclic antidepressants. With current home regimen, she was at extremely high risk for an adverse event such as this.  - IV fluids as ordered - Hold home regimen - Monitor closely for withdrawal from Percocet, baclofen and Klonopin Follow psych consult  # Rhabdomyolysis In the setting of unknown downtime. CK 50K--31k ---18k--8k--4529--2380 trending down - IV fluids as ordered - Recheck CK in the a.m.   # Acute respiratory failure with hypoxia.  Resolved Patient was noted to be hypoxic as low as 87%.  Likely multifactorial in the setting of encephalopathy and suspected aspiration pneumonia.  Given history of COPD, that may be contributing as well although no wheezing on examination. -Supplemental O2 inhalation weaned off, currently saturating well on room air.    # Aspiration pneumonia Chest x-ray notable for right lower lobe and possible right middle lobe opacity and in the setting of drug overdose, most likely aspiration related.  History of significant penicillin allergy and no prior history of cephalosporin trial. -s/p Levofloxacin, renal dose x 1 as per pharmacy  12/20 started doxycycline 100 mg p.o. twice daily for 5 days - Aspiration precautions   # AKI (acute kidney  injury) Resolved  In the setting of dehydration and rhabdomyolysis. -Continue IV fluids - Hold home nephrotoxic agents - Strict in and out - Repeat BMP in the a.m. Creatinine 0.99 improving   # Transaminitis, monitor LFTs.  # Hypokalemia, potassium repleted. # Magnesium, mag repleted. # Hypophosphatemia, continue to monitor Monitor electrolytes and replete as needed.   # Chronic obstructive pulmonary disease History of COPD at  high risk of acute exacerbation in the setting of aspiration pneumonia - DuoNebs every 6 hours as needed - Hold home bronchodilators - Hold off on steroids at this time   # Chronic pain syndrome Patient follows Dr. [name], [facility] for pain management - Hold home Percocet, Gabapentin and Baclofen given lethargy and altered mental status - Monitor closely for evidence of withdrawal 12/18 started oxycodone 5 mg p.o. every 6 hourly as needed and morphine IV as needed.  Mental status improved. 12/21 follow MRI lumbar spine, patient has scoliosis and sciatica, pain radiates to right lower extremity Resumed gabapentin 300 mg p.o. 3 times daily and continue oxycodone as needed for pain control  # Post-traumatic stress disorder Patient is currently on BuSpar, Klonopin, and prazosin. - Hold home regimen in the setting of lethargy and altered mental status - CIWA monitoring given high risk for withdrawal Psych consulted  # Renal cyst, left.  Incidental finding  Left Bosniak I benign renal cyst measuring 1.9 cm. No follow-up imaging is recommended.   Body mass index is 31.24 kg/m.  Interventions:  Diet: Regular diet DVT Prophylaxis: Subcutaneous Lovenox   Advance goals of care discussion: Full code  Family Communication: family was not present at bedside, at the time of interview.  Patient is oriented to herself only  and very confused   Disposition:  Pt is from Home, admitted with drug overdose, respiratory failure, rhabdomyolysis, still has elevated CK and electrolyte imbalance, On IV fluids , which precludes a safe discharge. Discharge to home, when stable, may need few days to improve.  Subjective: No significant events overnight, patient is complaining of numbness and pain radiating to right lower extremity.  Patient has chronic lower back pain and scoliosis. Denied any new neurological symptoms. Agreed for MRI spine.    Physical Exam: General: NAD, lying comfortably Appear in  no distress, affect depressed Eyes: PERRLA ENT: Oral Mucosa Clear, moist  Neck: no JVD,  Cardiovascular: S1 and S2 Present, no Murmur,  Respiratory: Equal air entry bilaterally, no crackles, No wheezing Abdomen: Bowel Sound present, Soft and no tenderness,  Skin: no rashes Extremities: no Pedal edema, no calf tenderness Neurologic: without any new focal findings Gait not checked due to patient safety concerns  Vitals:   10/12/23 0428 10/12/23 0911 10/12/23 1217 10/12/23 1605  BP:  130/85 (!) 142/96 132/88  Pulse:  73 74 72  Resp:   18 18  Temp:  98.3 F (36.8 C) 98.3 F (36.8 C) 98.7 F (37.1 C)  TempSrc:      SpO2:  96% 96% 97%  Weight: 93.2 kg     Height:        Intake/Output Summary (Last 24 hours) at 10/12/2023 1631 Last data filed at 10/12/2023 0700 Gross per 24 hour  Intake 1359.19 ml  Output 750 ml  Net 609.19 ml   Filed Weights   10/10/23 0426 10/11/23 0638 10/12/23 0428  Weight: 90.4 kg 90.8 kg 93.2 kg    Data Reviewed: I have personally reviewed and interpreted daily labs, tele strips, imagings as discussed above. I reviewed all nursing  notes, pharmacy notes, vitals, pertinent old records I have discussed plan of care as described above with RN and patient/family.  CBC: Recent Labs  Lab 10/07/23 1552 10/08/23 0452 10/09/23 0859 10/10/23 0637 10/11/23 0413 10/12/23 0416  WBC 15.1* 12.0* 8.6 7.9 6.3 5.5  NEUTROABS 12.9*  --   --   --   --   --   HGB 16.4* 13.7 11.8* 10.9* 11.2* 11.2*  HCT 48.4* 40.6 36.1 32.2* 32.6* 32.6*  MCV 93.6 94.6 97.8 93.6 93.9 92.9  PLT 279 223 150 168 187 187   Basic Metabolic Panel: Recent Labs  Lab 10/08/23 0452 10/09/23 0859 10/10/23 0637 10/11/23 0413 10/12/23 0416  NA 136 135 138 139 139  K 2.9* 3.3* 3.3* 3.7 2.8*  CL 106 109 112* 109 106  CO2 21* 20* 18* 23 25  GLUCOSE 119* 112* 106* 97 115*  BUN 27* 26* 16 12 12   CREATININE 1.60* 1.45* 1.19* 0.99 0.86  CALCIUM 8.0* 8.2* 8.3* 8.6* 8.3*  MG 1.5* 2.2 1.9  1.6* 1.7  PHOS 3.1 3.0 2.2* 1.7* 3.0    Studies: No results found.   Scheduled Meds:  calcium carbonate  1 tablet Oral BID WC   doxycycline  100 mg Oral Q12H   feeding supplement  237 mL Oral BID BM   folic acid  1 mg Oral Daily   gabapentin  300 mg Oral TID   heparin injection (subcutaneous)  5,000 Units Subcutaneous Q8H   multivitamin with minerals  1 tablet Oral Daily   oxymetazoline  1 spray Each Nare BID   pantoprazole  40 mg Oral Daily   potassium chloride  40 mEq Oral TID   sodium chloride flush  3 mL Intravenous Q12H   thiamine  100 mg Oral Daily   Or   thiamine  100 mg Intravenous Daily   Continuous Infusions:  sodium chloride 75 mL/hr at 10/12/23 1022   PRN Meds: acetaminophen **OR** acetaminophen, ipratropium-albuterol, morphine injection, ondansetron **OR** ondansetron (ZOFRAN) IV, oxyCODONE, oxyCODONE-acetaminophen  Time spent: 40 minutes  Author: Gillis Santa. MD [facility] 10/12/2023 4:31 PM  To reach On-call, see care teams to locate the attending and reach out to them via [email or web address]. If 7PM-7AM, please contact night-coverage If you still have difficulty reaching the attending provider, please page the [facility] (Director on Call) for [facility] on amion for assistance.

## 2023-10-12 NOTE — Consult Note (Signed)
PHARMACY CONSULT NOTE - ELECTROLYTES  Pharmacy Consult for Electrolyte Monitoring and Replacement   Recent Labs: Potassium (mmol/L)  Date Value  10/12/2023 2.8 (L)   Magnesium (mg/dL)  Date Value  91/47/8295 1.7   Calcium (mg/dL)  Date Value  62/13/0865 8.3 (L)   Albumin (g/dL)  Date Value  78/46/9629 2.9 (L)   Phosphorus (mg/dL)  Date Value  52/84/1324 3.0   Sodium (mmol/L)  Date Value  10/12/2023 139   Height: 5\' 8"  (172.7 cm) Weight: 93.2 kg (205 lb 7.5 oz) IBW/kg (Calculated) : 63.9 Estimated Creatinine Clearance: 78.9 mL/min (by C-G formula based on SCr of 0.86 mg/dL).  Assessment  Tami Hopkins is a 62 y.o. female presenting with AMS. PMH significant for chronic pain syndrome (on Percocet, baclofen, and gabapentin), PTSD (on Klonopin), GERD, IBS, hyperlipidemia . Pharmacy has been consulted to monitor and replace electrolytes. AKI on admission in setting of rhabdomyolysis (CK > 50k). Scr trending down.   Diet: PO, regular MIVF: NS @ 125 mL/hr x 24 hours.  Pertinent medications: N/A  Goal of Therapy: Electrolytes WNL 10/12/2023  Plan:  Mg 2 g IV x 1 Kcl 40 mEq x 3.  F/u with AM labs.    Thank you for allowing pharmacy to be a part of this patient's care.  Ronnald Ramp, PharmD  10/12/2023 7:16 AM

## 2023-10-13 DIAGNOSIS — M48061 Spinal stenosis, lumbar region without neurogenic claudication: Secondary | ICD-10-CM | POA: Diagnosis not present

## 2023-10-13 DIAGNOSIS — M5116 Intervertebral disc disorders with radiculopathy, lumbar region: Secondary | ICD-10-CM | POA: Diagnosis not present

## 2023-10-13 DIAGNOSIS — M4125 Other idiopathic scoliosis, thoracolumbar region: Secondary | ICD-10-CM

## 2023-10-13 DIAGNOSIS — T50901A Poisoning by unspecified drugs, medicaments and biological substances, accidental (unintentional), initial encounter: Secondary | ICD-10-CM | POA: Diagnosis not present

## 2023-10-13 LAB — CK: Total CK: 1415 U/L — ABNORMAL HIGH (ref 38–234)

## 2023-10-13 LAB — CBC
HCT: 34.1 % — ABNORMAL LOW (ref 36.0–46.0)
Hemoglobin: 11.6 g/dL — ABNORMAL LOW (ref 12.0–15.0)
MCH: 31.8 pg (ref 26.0–34.0)
MCHC: 34 g/dL (ref 30.0–36.0)
MCV: 93.4 fL (ref 80.0–100.0)
Platelets: 231 10*3/uL (ref 150–400)
RBC: 3.65 MIL/uL — ABNORMAL LOW (ref 3.87–5.11)
RDW: 13.7 % (ref 11.5–15.5)
WBC: 7.1 10*3/uL (ref 4.0–10.5)
nRBC: 0 % (ref 0.0–0.2)

## 2023-10-13 LAB — HEPATIC FUNCTION PANEL
ALT: 113 U/L — ABNORMAL HIGH (ref 0–44)
AST: 91 U/L — ABNORMAL HIGH (ref 15–41)
Albumin: 3.4 g/dL — ABNORMAL LOW (ref 3.5–5.0)
Alkaline Phosphatase: 53 U/L (ref 38–126)
Bilirubin, Direct: 0.2 mg/dL (ref 0.0–0.2)
Indirect Bilirubin: 0.6 mg/dL (ref 0.3–0.9)
Total Bilirubin: 0.8 mg/dL (ref <1.2)
Total Protein: 6.4 g/dL — ABNORMAL LOW (ref 6.5–8.1)

## 2023-10-13 LAB — BASIC METABOLIC PANEL
Anion gap: 9 (ref 5–15)
BUN: 12 mg/dL (ref 8–23)
CO2: 25 mmol/L (ref 22–32)
Calcium: 9.3 mg/dL (ref 8.9–10.3)
Chloride: 106 mmol/L (ref 98–111)
Creatinine, Ser: 0.94 mg/dL (ref 0.44–1.00)
GFR, Estimated: >60 mL/min (ref >60)
Glucose, Bld: 122 mg/dL — ABNORMAL HIGH (ref 70–99)
Potassium: 4.4 mmol/L (ref 3.5–5.1)
Sodium: 140 mmol/L (ref 135–145)

## 2023-10-13 LAB — PHOSPHORUS: Phosphorus: 2.2 mg/dL — ABNORMAL LOW (ref 2.5–4.6)

## 2023-10-13 LAB — MAGNESIUM: Magnesium: 2 mg/dL (ref 1.7–2.4)

## 2023-10-13 MED ORDER — MELATONIN 5 MG PO TABS
5.0000 mg | ORAL_TABLET | Freq: Once | ORAL | Status: AC
  Administered 2023-10-13: 5 mg via ORAL
  Filled 2023-10-13: qty 1

## 2023-10-13 MED ORDER — METHYLPREDNISOLONE 4 MG PO TABS: 2.0000 mg | ORAL_TABLET | Freq: Every day | ORAL | Status: DC

## 2023-10-13 MED ORDER — METHYLPREDNISOLONE 4 MG PO TABS
8.0000 mg | ORAL_TABLET | Freq: Every day | ORAL | Status: AC
Start: 2023-10-14 — End: 2023-10-15
  Administered 2023-10-14: 8 mg via ORAL
  Filled 2023-10-13: qty 2

## 2023-10-13 MED ORDER — LOPERAMIDE HCL 2 MG PO CAPS
4.0000 mg | ORAL_CAPSULE | Freq: Three times a day (TID) | ORAL | Status: DC | PRN
  Administered 2023-10-13: 4 mg via ORAL
  Filled 2023-10-13: qty 2

## 2023-10-13 MED ORDER — METHYLPREDNISOLONE 4 MG PO TABS: 4.0000 mg | ORAL_TABLET | Freq: Every day | ORAL | Status: DC

## 2023-10-13 MED ORDER — SACCHAROMYCES BOULARDII 250 MG PO CAPS
250.0000 mg | ORAL_CAPSULE | Freq: Two times a day (BID) | ORAL | Status: DC
  Administered 2023-10-13 – 2023-10-14 (×2): 250 mg via ORAL
  Filled 2023-10-13 (×2): qty 1

## 2023-10-13 MED ORDER — DEXAMETHASONE SODIUM PHOSPHATE 10 MG/ML IJ SOLN
10.0000 mg | Freq: Once | INTRAMUSCULAR | Status: AC
  Administered 2023-10-13: 10 mg via INTRAVENOUS
  Filled 2023-10-13: qty 1

## 2023-10-13 NOTE — Consult Note (Signed)
PHARMACY CONSULT NOTE - ELECTROLYTES  Pharmacy Consult for Electrolyte Monitoring and Replacement   Recent Labs: Potassium (mmol/L)  Date Value  10/13/2023 4.4   Magnesium (mg/dL)  Date Value  02/72/5366 2.0   Calcium (mg/dL)  Date Value  44/12/4740 9.3   Albumin (g/dL)  Date Value  59/56/3875 3.4 (L)   Phosphorus (mg/dL)  Date Value  64/33/2951 2.2 (L)   Sodium (mmol/L)  Date Value  10/13/2023 140   Height: 5\' 8"  (172.7 cm) Weight: 92.1 kg (203 lb 0.7 oz) IBW/kg (Calculated) : 63.9 Estimated Creatinine Clearance: 71.8 mL/min (by C-G formula based on SCr of 0.94 mg/dL).  Assessment  Tami Hopkins is a 62 y.o. female presenting with AMS. PMH significant for chronic pain syndrome (on Percocet, baclofen, and gabapentin), PTSD (on Klonopin), GERD, IBS, hyperlipidemia . Pharmacy has been consulted to monitor and replace electrolytes. AKI on admission in setting of rhabdomyolysis (CK > 50k). Scr trending down.   Diet: PO, regular MIVF: NS @ 75 mL/hr x 24 hours.  Pertinent medications: N/A  Goal of Therapy: Electrolytes WNL 10/13/2023  Plan:  No replacement needed F/u with AM labs.    Thank you for allowing pharmacy to be a part of this patient's care.  Ronnald Ramp, PharmD  10/13/2023 8:43 AM

## 2023-10-13 NOTE — Plan of Care (Signed)
  Problem: Health Behavior/Discharge Planning: Goal: Ability to manage health-related needs will improve Outcome: Progressing   Problem: Clinical Measurements: Goal: Ability to maintain clinical measurements within normal limits will improve Outcome: Progressing   Problem: Clinical Measurements: Goal: Will remain free from infection Outcome: Progressing   Problem: Clinical Measurements: Goal: Will remain free from infection Outcome: Progressing   Problem: Activity: Goal: Risk for activity intolerance will decrease Outcome: Progressing   Problem: Coping: Goal: Level of anxiety will decrease Outcome: Progressing

## 2023-10-13 NOTE — Progress Notes (Signed)
Rounding tech

## 2023-10-13 NOTE — Plan of Care (Signed)
  Problem: Health Behavior/Discharge Planning: Goal: Ability to manage health-related needs will improve Outcome: Progressing   Problem: Clinical Measurements: Goal: Will remain free from infection Outcome: Progressing Goal: Diagnostic test results will improve Outcome: Progressing Goal: Respiratory complications will improve Outcome: Progressing   

## 2023-10-13 NOTE — Progress Notes (Addendum)
[facility] Progress Note  Patient: Tami Hopkins    UJW:[id number]  DOA: 10/07/2023     Date of Service: the patient was seen and examined on 10/13/2023  Chief Complaint  Patient presents with   Altered Mental Status   Brief hospital course: CAIAH MCGLATHERY is a 62 y.o. female with medical history significant of chronic pain syndrome (on Percocet, baclofen, and gabapentin), PTSD (on Klonopin), GERD, IBS, hyperlipidemia, who presents to the ED due to altered mental status.   History limited due to patient's altered mental status.  Her daughter at bedside is not able to provide additional history, but states that she has been worried about patient's medications, stating she often takes more than she is supposed to.   Per chart review, EMS was called after patient's roommate found her on the bathroom floor.  Unclear how long patient was down on the bathroom floor and she is not able to tell me what led to her being there.  She cannot tell me if she took extra doses of any of her medications.   ED course: On arrival to the ED, patient was normotensive at 126/92 with heart rate of 115.  She was saturating at 92% on room air but subsequently desaturated to 87% and was placed on 3 L with improvement.  She was tachycardic up to 115.  She was afebrile at 98.4.  Initial workup notable for VBG with pH of 7.37, pCO2 of 38.  CBC with WBC of 15.1, hemoglobin of 16.4.  CK elevated above 50,000.  CMP with potassium of 3.1, bicarb 20, anion gap 17, AST 710, ALT 255, creatinine 1.87 and GFR of 30.  Lactic acid 3.0.  UDS positive for benzos, opioids, and tricyclic's.  UA with hematuria but no RBC; many bacteria, calcium oxalate, and WBC clumps noted.  Alcohol level negative.  Chest x-ray with consolidation in the right lung base.  CT head negative.  CT renal stone study with no obstructing stones.  Patient started on IV fluids and levofloxacin.  [facility] contacted for admission.   Assessment and Plan:  #  Drug overdose Patient presenting with suspected drug overdose, unclear if intentional or unintentional.  She is on multiple centrally acting medications at home including gabapentin, baclofen, Percocet, Klonopin, Buspar and Seroquel (all with recent refills).  UDS also positive for tricyclic antidepressants. With current home regimen, she was at extremely high risk for an adverse event such as this.  - IV fluids as ordered - Hold home regimen - Monitor closely for withdrawal from Percocet, baclofen and Klonopin Follow psych consult  # Rhabdomyolysis In the setting of unknown downtime. CK 50K--31k ---18k--8k--4529--2380--1415 trending down - IV fluids as ordered - Recheck CK in the a.m.   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 [name] pain management clinic as an outpatient MRI shows significant compression of L4-5 due to disc bulging on right side, left-sided muscular edema Discussed with neurosurgery, recommended surgical intervention but patient denied so recommended Decadron 10 mg IV x 1 dose followed by Medrol 8 mg p.o. x 1 dose followed by 4 mg p.o. daily x 1 dose followed by 2 mg p.o. daily x 1 dose. Recommended to follow as an outpatient, PT OT eval and disposition plan with home health.   # Acute respiratory failure with hypoxia.  Resolved Patient was noted to be hypoxic as low as 87%.  Likely multifactorial in the setting of encephalopathy and  suspected aspiration pneumonia.  Given history of COPD, that may be contributing as well although no wheezing on examination. -Supplemental O2 inhalation weaned off, currently saturating well on room air.    # Aspiration pneumonia Chest x-ray notable for right lower lobe and possible right middle lobe opacity and in the setting of drug overdose, most likely aspiration related.  History of significant penicillin allergy and no prior history of cephalosporin trial. -s/p  Levofloxacin, renal dose x 1 as per pharmacy  12/20 started doxycycline 100 mg p.o. twice daily for 5 days - Aspiration precautions   # AKI (acute kidney injury) Resolved  In the setting of dehydration and rhabdomyolysis. -Continue IV fluids - Hold home nephrotoxic agents - Strict in and out - Repeat BMP in the a.m. Creatinine 0.99 improving   # Transaminitis, monitor LFTs.  # Hypokalemia, potassium repleted. # Magnesium, mag repleted. # Hypophosphatemia, continue to monitor Monitor electrolytes and replete as needed.   # Chronic obstructive pulmonary disease History of COPD at high risk of acute exacerbation in the setting of aspiration pneumonia - DuoNebs every 6 hours as needed - Hold home bronchodilators - Hold off on steroids at this time   # Chronic pain syndrome Patient follows Dr. [name], Fayrene Fearing for pain management - Hold home Percocet, Gabapentin and Baclofen given lethargy and altered mental status - Monitor closely for evidence of withdrawal 12/18 started oxycodone 5 mg p.o. every 6 hourly as needed and morphine IV as needed.  Mental status improved. 12/21 MRI lumbar spine as above  Resumed gabapentin 300 mg p.o. 3 times daily and continue oxycodone as needed for pain control  # Post-traumatic stress disorder Patient is currently on BuSpar, Klonopin, and prazosin. - Hold home regimen in the setting of lethargy and altered mental status - CIWA monitoring given high risk for withdrawal Psych consulted  # Renal cyst, left.  Incidental finding  Left Bosniak I benign renal cyst measuring 1.9 cm. No follow-up imaging is recommended.   Body mass index is 30.87 kg/m.  Interventions:  Diet: Regular diet DVT Prophylaxis: Subcutaneous Lovenox   Advance goals of care discussion: Full code  Family Communication: family was not present at bedside, at the time of interview.  Patient is oriented to herself only  and very confused   Disposition:  Pt is from Home,  admitted with drug overdose, respiratory failure, rhabdomyolysis, still has elevated CK and electrolyte imbalance, On IV fluids , which precludes a safe discharge. Discharge to home, when stable, may need few days to improve.  Subjective: No significant events overnight, patient says that she is feeling little bit improvement, still has pain in the lower back radiating to right leg it is a shooting nerve pain off and on.  Patient is requesting to go home.  Patient was advised to stay for steroid therapy today, IV fluids to check CK level tomorrow a.m. and then discharge.   Physical Exam: General: NAD, lying comfortably Appear in no distress, affect depressed Eyes: PERRLA ENT: Oral Mucosa Clear, moist  Neck: no JVD,  Cardiovascular: S1 and S2 Present, no Murmur,  Respiratory: Equal air entry bilaterally, no crackles, No wheezing Abdomen: Bowel Sound present, Soft and no tenderness,  Skin: no rashes Extremities: no Pedal edema, no calf tenderness Neurologic: without any new focal findings Gait not checked due to patient safety concerns  Vitals:   10/13/23 0400 10/13/23 0754 10/13/23 1213 10/13/23 1547  BP: (!) 143/88 (!) 145/98 (!) 130/91 133/85  Pulse: 76 76 78 85  Resp: 16 18 18 18   Temp: 97.7 F (36.5 C) 98.1 F (36.7 C) 99 F (37.2 C) 99 F (37.2 C)  TempSrc:      SpO2: 97% 95% 94% 96%  Weight:      Height:        Intake/Output Summary (Last 24 hours) at 10/13/2023 1631 Last data filed at 10/13/2023 0340 Gross per 24 hour  Intake 1262.82 ml  Output --  Net 1262.82 ml   Filed Weights   10/11/23 1610 10/12/23 0428 10/13/23 0346  Weight: 90.8 kg 93.2 kg 92.1 kg    Data Reviewed: I have personally reviewed and interpreted daily labs, tele strips, imagings as discussed above. I reviewed all nursing notes, pharmacy notes, vitals, pertinent old records I have discussed plan of care as described above with RN and patient/family.  CBC: Recent Labs  Lab 10/07/23 1552  10/08/23 0452 10/09/23 0859 10/10/23 0637 10/11/23 0413 10/12/23 0416 10/13/23 0551  WBC 15.1*   < > 8.6 7.9 6.3 5.5 7.1  NEUTROABS 12.9*  --   --   --   --   --   --   HGB 16.4*   < > 11.8* 10.9* 11.2* 11.2* 11.6*  HCT 48.4*   < > 36.1 32.2* 32.6* 32.6* 34.1*  MCV 93.6   < > 97.8 93.6 93.9 92.9 93.4  PLT 279   < > 150 168 187 187 231   < > = values in this interval not displayed.   Basic Metabolic Panel: Recent Labs  Lab 10/09/23 0859 10/10/23 0637 10/11/23 0413 10/12/23 0416 10/13/23 0551  NA 135 138 139 139 140  K 3.3* 3.3* 3.7 2.8* 4.4  CL 109 112* 109 106 106  CO2 20* 18* 23 25 25   GLUCOSE 112* 106* 97 115* 122*  BUN 26* 16 12 12 12   CREATININE 1.45* 1.19* 0.99 0.86 0.94  CALCIUM 8.2* 8.3* 8.6* 8.3* 9.3  MG 2.2 1.9 1.6* 1.7 2.0  PHOS 3.0 2.2* 1.7* 3.0 2.2*    Studies: MR LUMBAR SPINE WO CONTRAST Result Date: 10/12/2023 CLINICAL DATA:  Low back pain.  Right lower extremity numbness. EXAM: MRI LUMBAR SPINE WITHOUT CONTRAST TECHNIQUE: Multiplanar, multisequence MR imaging of the lumbar spine was performed. No intravenous contrast was administered. COMPARISON:  06/18/2017 FINDINGS: Segmentation:  Standard. Alignment:  Right convex scoliosis with apex at L2 Vertebrae: Superior endplate Schmorl's nodes at L1 and L2, unchanged. No acute fracture. Conus medullaris and cauda equina: Conus extends to the L1 level. Conus and cauda equina appear normal. Paraspinal and other soft tissues: Edema within the left paraspinous muscles Disc levels: T12-L1: Left asymmetric disc bulge narrowing of the left lateral recess. No spinal canal stenosis centrally. Mild left foraminal stenosis, unchanged. L1-L2: Normal disc space and facet joints. No spinal canal stenosis. No neural foraminal stenosis. L2-L3: Small disc bulge. No spinal canal stenosis. No neural foraminal stenosis. L3-L4: Left asymmetric disc bulge. No spinal canal stenosis. Unchanged moderate left neural foraminal stenosis. L4-L5:  Intermediate sized right asymmetric disc bulge. No spinal canal stenosis. Worsened severe right neural foraminal stenosis. L5-S1: Normal disc space and facet joints. No spinal canal stenosis. No neural foraminal stenosis. Visualized sacrum: Normal. IMPRESSION: 1. Worsened severe right L4-L5 neural foraminal stenosis due to asymmetric disc bulge. 2. Unchanged moderate left L3-L4 neural foraminal stenosis. 3. No spinal canal stenosis. 4. Edema within the left paraspinous muscles. Electronically Signed   By: Deatra Robinson M.D.   On: 10/12/2023 23:11     Scheduled  Meds:  calcium carbonate  1 tablet Oral BID WC   doxycycline  100 mg Oral Q12H   feeding supplement  237 mL Oral BID BM   folic acid  1 mg Oral Daily   gabapentin  300 mg Oral TID   heparin injection (subcutaneous)  5,000 Units Subcutaneous Q8H   [START ON 10/14/2023] methylPREDNISolone  8 mg Oral Daily   Followed by   Melene Muller ON 10/15/2023] methylPREDNISolone  4 mg Oral Daily   Followed by   Melene Muller ON 10/16/2023] methylPREDNISolone  2 mg Oral Daily   multivitamin with minerals  1 tablet Oral Daily   pantoprazole  40 mg Oral Daily   saccharomyces boulardii  250 mg Oral BID   sodium chloride flush  3 mL Intravenous Q12H   thiamine  100 mg Oral Daily   Or   thiamine  100 mg Intravenous Daily   Continuous Infusions:   PRN Meds: acetaminophen **OR** acetaminophen, ipratropium-albuterol, loperamide, ondansetron **OR** ondansetron (ZOFRAN) IV, oxyCODONE  Time spent: 55 minutes  Author: Gillis Santa. MD [facility] 10/13/2023 4:31 PM  To reach On-call, see care teams to locate the attending and reach out to them via [email or web address]. If 7PM-7AM, please contact night-coverage If you still have difficulty reaching the attending provider, please page the [facility] (Director on Call) for [facility] on amion for assistance.

## 2023-10-13 NOTE — Consult Note (Signed)
Consulting Department:  Internal Medicine  Primary Physician:  Ardyth Man, PA-C  Chief Complaint:  Disk Herniation/foraminal stenosis  History of Present Illness: 10/13/2023 Tami Hopkins is a 62 y.o. female who presents with the chief complaint of right sided lower extremity pain.  She has a history of chronic pain and spinal deformity who has been following with Dr. [name] for multiple injections.  She has had previous neurosurgery consults where surgery was discussed for her significant spondylosis.  She initially was having a severe amount of back pain but then started radiating into her lower extremity.  This goes down the back of her leg to the anterior lateral and anterior medial aspects of her feet.  Sometimes goes down to the bottom of her feet.  She does feel like she has some weakness but does not feel like it severe.  She feels like most of her limitation is from pain.  She denies any bowel or bladder dysfunction.  The symptoms are causing a significant impact on the patient's life.   Review of Systems:  A 10 point review of systems is negative, except for the pertinent positives and negatives detailed in the HPI.  Past Medical History: Past Medical History:  Diagnosis Date   Anxiety    Asthma    Bursitis of hip bilateral    Hyperlipidemia    PTSD (post-traumatic stress disorder)    Scoliosis     Past Surgical History: Past Surgical History:  Procedure Laterality Date   ABDOMINAL HYSTERECTOMY     CESAREAN SECTION     CHOLECYSTECTOMY     ENDOMETRIAL ABLATION     x3   NOSE SURGERY      Allergies: Allergies as of 10/07/2023 - Review Complete 10/07/2023  Allergen Reaction Noted   Penicillins Anaphylaxis 04/09/2015   Sulfa antibiotics Anaphylaxis 01/26/2014   Amitriptyline  07/18/2015   Bupropion  07/18/2015   Doxepin  07/18/2015   Moxifloxacin Other (See Comments) 08/18/2015    Medications:  Current Facility-Administered Medications:    0.9 %   sodium chloride infusion, , Intravenous, Continuous, Gillis Santa, MD, Stopped at 10/13/23 0931   acetaminophen (TYLENOL) tablet 650 mg, 650 mg, Oral, Q6H PRN, 650 mg at 10/13/23 0847 **OR** acetaminophen (TYLENOL) suppository 650 mg, 650 mg, Rectal, Q6H PRN, Verdene Lennert, MD   calcium carbonate (TUMS - dosed in mg elemental calcium) chewable tablet 200 mg of elemental calcium, 1 tablet, Oral, BID WC, Lucianne Muss, Dileep, MD, 200 mg of elemental calcium at 10/13/23 0843   doxycycline (VIBRA-TABS) tablet 100 mg, 100 mg, Oral, Q12H, Gillis Santa, MD, 100 mg at 10/13/23 0843   feeding supplement (ENSURE ENLIVE / ENSURE PLUS) liquid 237 mL, 237 mL, Oral, BID BM, Gillis Santa, MD, 237 mL at 10/13/23 0847   folic acid (FOLVITE) tablet 1 mg, 1 mg, Oral, Daily, Verdene Lennert, MD, 1 mg at 10/13/23 0843   gabapentin (NEURONTIN) capsule 300 mg, 300 mg, Oral, TID, Gillis Santa, MD, 300 mg at 10/13/23 0843   heparin injection 5,000 Units, 5,000 Units, Subcutaneous, Q8H, Gillis Santa, MD, 5,000 Units at 10/13/23 0528   ipratropium-albuterol (DUONEB) 0.5-2.5 (3) MG/3ML nebulizer solution 3 mL, 3 mL, Nebulization, Q6H PRN, Gillis Santa, MD   multivitamin with minerals tablet 1 tablet, 1 tablet, Oral, Daily, Verdene Lennert, MD, 1 tablet at 10/12/23 1703   ondansetron (ZOFRAN) tablet 4 mg, 4 mg, Oral, Q6H PRN **OR** ondansetron (ZOFRAN) injection 4 mg, 4 mg, Intravenous, Q6H PRN, Verdene Lennert, MD, 4 mg at 10/08/23 [phone]  oxyCODONE (Oxy IR/ROXICODONE) immediate release tablet 5 mg, 5 mg, Oral, Q6H PRN, Gillis Santa, MD, 5 mg at 10/13/23 0528   pantoprazole (PROTONIX) EC tablet 40 mg, 40 mg, Oral, Daily, Gillis Santa, MD, 40 mg at 10/13/23 4098   sodium chloride flush (NS) 0.9 % injection 3 mL, 3 mL, Intravenous, Q12H, Verdene Lennert, MD, 3 mL at 10/12/23 1032   thiamine (VITAMIN B1) tablet 100 mg, 100 mg, Oral, Daily, 100 mg at 10/13/23 0843 **OR** thiamine (VITAMIN B1) injection 100 mg, 100 mg, Intravenous,  Daily, Verdene Lennert, MD, 100 mg at 10/11/23 1191   Social History: Social History   Tobacco Use   Smoking status: Every Day    Current packs/day: 0.25    Average packs/day: 0.3 packs/day for 28.2 years (7.0 ttl pk-yrs)    Types: Cigarettes    Start date: 08/18/1995   Smokeless tobacco: Never  Vaping Use   Vaping status: Never Used  Substance Use Topics   Alcohol use: No    Alcohol/week: 0.0 - 2.0 standard drinks of alcohol    Comment: rare   Drug use: No    Family Medical History: Family History  Problem Relation Age of Onset   Asthma Mother    Arthritis Mother    Depression Mother    Heart attack Father    Alcohol abuse Father    Hypertension Sister    Diabetes Sister    Obesity Sister    Breast cancer Sister 72       diagnosed a 2nd time metastic   Endometriosis Daughter    Endometriosis Daughter    Breast cancer Maternal Aunt        Great aunt   Heart failure Neg Hx     Physical Examination: Vitals:   10/13/23 0400 10/13/23 0754  BP: (!) 143/88 (!) 145/98  Pulse: 76 76  Resp: 16 18  Temp: 97.7 F (36.5 C) 98.1 F (36.7 C)  SpO2: 97% 95%     General: Patient is well developed, well nourished, she is intermittently tearful, continues to speak about her dog at home.   NEUROLOGICAL:  General: Tearful.   Awake, alert, oriented to person, place, and time.  Pupils equal round and reactive to light.  Facial tone is symmetric.  Tongue protrusion is midline.  There is no pronator drift.  Strength:  Side Iliopsoas Quads Hamstring PF DF EHL  R 5 5 5  4+ 4 4  L 5 5 5 5 5 5     Bilateral upper and lower extremity sensation is intact to light touch with the exception of some decrease in the right lower extremity in the L4/5 distributions  Reflexes are 2+ at the bilateral patellas, trace at the bilateral Achilles, trace of the bilateral medial hamstrings  Clonus is not present.  Toes are down-going.    Imaging: MR LUMBAR SPINE WO CONTRAST Result Date:  10/12/2023 CLINICAL DATA:  Low back pain.  Right lower extremity numbness. EXAM: MRI LUMBAR SPINE WITHOUT CONTRAST TECHNIQUE: Multiplanar, multisequence MR imaging of the lumbar spine was performed. No intravenous contrast was administered. COMPARISON:  06/18/2017 FINDINGS: Segmentation:  Standard. Alignment:  Right convex scoliosis with apex at L2 Vertebrae: Superior endplate Schmorl's nodes at L1 and L2, unchanged. No acute fracture. Conus medullaris and cauda equina: Conus extends to the L1 level. Conus and cauda equina appear normal. Paraspinal and other soft tissues: Edema within the left paraspinous muscles Disc levels: T12-L1: Left asymmetric disc bulge narrowing of the left lateral recess. No spinal  canal stenosis centrally. Mild left foraminal stenosis, unchanged. L1-L2: Normal disc space and facet joints. No spinal canal stenosis. No neural foraminal stenosis. L2-L3: Small disc bulge. No spinal canal stenosis. No neural foraminal stenosis. L3-L4: Left asymmetric disc bulge. No spinal canal stenosis. Unchanged moderate left neural foraminal stenosis. L4-L5: Intermediate sized right asymmetric disc bulge. No spinal canal stenosis. Worsened severe right neural foraminal stenosis. L5-S1: Normal disc space and facet joints. No spinal canal stenosis. No neural foraminal stenosis. Visualized sacrum: Normal. IMPRESSION: 1. Worsened severe right L4-L5 neural foraminal stenosis due to asymmetric disc bulge. 2. Unchanged moderate left L3-L4 neural foraminal stenosis. 3. No spinal canal stenosis. 4. Edema within the left paraspinous muscles. Electronically Signed   By: Deatra Robinson M.D.   On: 10/12/2023 23:11     I have personally reviewed the images and agree with the above interpretation.  Labs:    Latest Ref Rng & Units 10/13/2023    5:51 AM 10/12/2023    4:16 AM 10/11/2023    4:13 AM  CBC  WBC 4.0 - 10.5 K/uL 7.1  5.5  6.3   Hemoglobin 12.0 - 15.0 g/dL 65.7  84.6  96.2   Hematocrit 36.0 - 46.0 %  34.1  32.6  32.6   Platelets 150 - 400 K/uL 231  187  187        Assessment and Plan: Ms. Gavia is a pleasant 62 y.o. female with history of chronic back pain who follows with Dr. [name] for injections.  She has known history of spinal deformity and was previously offered surgery as far back as 2019.  She has been trying avoid surgery since that time.  She presented with an acute exacerbation of lower extremity pain.  Found to have some worsening of her right sided foraminal stenosis.  She is not having any bowel or bladder dysfunction.  She does have some mild to moderate right lower extremity weakness and decreased sensation.  Her MRI as outlined above shows a significant deformity which was known, however progression has shown worsening of the foraminal stenosis partly in due to a coronal asymmetric collapse.  We did discuss that given her weakness she could potentially benefit from surgery, however she is adamant that she wants to avoid surgery at all costs and would like to try to get pain control through injections as she has in the past.  Will plan to reach out to her pain provider.  Lovenia Kim, MD/MSCR Dept. of Neurosurgery

## 2023-10-14 DIAGNOSIS — R4182 Altered mental status, unspecified: Secondary | ICD-10-CM

## 2023-10-14 DIAGNOSIS — M6282 Rhabdomyolysis: Secondary | ICD-10-CM | POA: Diagnosis not present

## 2023-10-14 LAB — CULTURE, BLOOD (ROUTINE X 2)
Culture: NO GROWTH
Special Requests: ADEQUATE

## 2023-10-14 LAB — BASIC METABOLIC PANEL
Anion gap: 10 (ref 5–15)
BUN: 17 mg/dL (ref 8–23)
CO2: 28 mmol/L (ref 22–32)
Calcium: 9.8 mg/dL (ref 8.9–10.3)
Chloride: 101 mmol/L (ref 98–111)
Creatinine, Ser: 0.98 mg/dL (ref 0.44–1.00)
GFR, Estimated: >60 mL/min (ref >60)
Glucose, Bld: 103 mg/dL — ABNORMAL HIGH (ref 70–99)
Potassium: 3.9 mmol/L (ref 3.5–5.1)
Sodium: 139 mmol/L (ref 135–145)

## 2023-10-14 LAB — HEPATIC FUNCTION PANEL
ALT: 99 U/L — ABNORMAL HIGH (ref 0–44)
AST: 70 U/L — ABNORMAL HIGH (ref 15–41)
Albumin: 3.5 g/dL (ref 3.5–5.0)
Alkaline Phosphatase: 50 U/L (ref 38–126)
Bilirubin, Direct: 0.3 mg/dL — ABNORMAL HIGH (ref 0.0–0.2)
Indirect Bilirubin: 0.9 mg/dL (ref 0.3–0.9)
Total Bilirubin: 1.2 mg/dL — ABNORMAL HIGH (ref <1.2)
Total Protein: 6.6 g/dL (ref 6.5–8.1)

## 2023-10-14 LAB — CBC
HCT: 35.4 % — ABNORMAL LOW (ref 36.0–46.0)
Hemoglobin: 11.7 g/dL — ABNORMAL LOW (ref 12.0–15.0)
MCH: 31.7 pg (ref 26.0–34.0)
MCHC: 33.1 g/dL (ref 30.0–36.0)
MCV: 95.9 fL (ref 80.0–100.0)
Platelets: 209 10*3/uL (ref 150–400)
RBC: 3.69 MIL/uL — ABNORMAL LOW (ref 3.87–5.11)
RDW: 13.8 % (ref 11.5–15.5)
WBC: 9.3 10*3/uL (ref 4.0–10.5)
nRBC: 0 % (ref 0.0–0.2)

## 2023-10-14 LAB — PHOSPHORUS: Phosphorus: 2.9 mg/dL (ref 2.5–4.6)

## 2023-10-14 LAB — CK: Total CK: 764 U/L — ABNORMAL HIGH (ref 38–234)

## 2023-10-14 LAB — MAGNESIUM: Magnesium: 2 mg/dL (ref 1.7–2.4)

## 2023-10-14 MED ORDER — SACCHAROMYCES BOULARDII 250 MG PO CAPS: 250.0000 mg | ORAL_CAPSULE | Freq: Two times a day (BID) | ORAL | 0 refills | Status: AC

## 2023-10-14 MED ORDER — FOLIC ACID 1 MG PO TABS: 1.0000 mg | ORAL_TABLET | Freq: Every day | ORAL | 0 refills | Status: AC

## 2023-10-14 MED ORDER — METHYLPREDNISOLONE 2 MG PO TABS: ORAL_TABLET | ORAL | 0 refills | Status: AC

## 2023-10-14 MED ORDER — GABAPENTIN 300 MG PO CAPS: 300.0000 mg | ORAL_CAPSULE | Freq: Three times a day (TID) | ORAL | 3 refills | Status: DC

## 2023-10-14 MED ORDER — DOXYCYCLINE HYCLATE 100 MG PO TABS: 100.0000 mg | ORAL_TABLET | Freq: Two times a day (BID) | ORAL | 0 refills | Status: AC

## 2023-10-14 MED ORDER — VITAMIN B-1 100 MG PO TABS: 100.0000 mg | ORAL_TABLET | Freq: Every day | ORAL | 0 refills | Status: DC

## 2023-10-14 NOTE — Consult Note (Signed)
PHARMACY CONSULT NOTE - ELECTROLYTES  Pharmacy Consult for Electrolyte Monitoring and Replacement   Recent Labs: Potassium (mmol/L)  Date Value  10/14/2023 3.9   Magnesium (mg/dL)  Date Value  40/07/2724 2.0   Calcium (mg/dL)  Date Value  36/64/4034 9.8   Albumin (g/dL)  Date Value  74/25/9563 3.5   Phosphorus (mg/dL)  Date Value  87/56/4332 2.9   Sodium (mmol/L)  Date Value  10/14/2023 139   Height: 5\' 8"  (172.7 cm) Weight: 91.8 kg (202 lb 6.1 oz) IBW/kg (Calculated) : 63.9 Estimated Creatinine Clearance: 68.8 mL/min (by C-G formula based on SCr of 0.98 mg/dL).  Assessment  Tami Hopkins is a 62 y.o. female presenting with AMS. PMH significant for chronic pain syndrome (on Percocet, baclofen, and gabapentin), PTSD (on Klonopin), GERD, IBS, hyperlipidemia . Pharmacy has been consulted to monitor and replace electrolytes. AKI on admission in setting of rhabdomyolysis (CK > 50k). Scr trending down.   Diet: PO, regular, ensure BID between meals.  MIVF: NS @ 75 mL/hr x 24 hours (now complete) Pertinent medications: N/A  Goal of Therapy: Electrolytes WNL 10/14/2023  Plan:  No replacement needed. Electrolytes are stable. Pharmacy will sign off. Please re-consult if needed.    Thank you for allowing pharmacy to be a part of this patient's care.  Ronnald Ramp, PharmD  10/14/2023 7:54 AM

## 2023-10-14 NOTE — Progress Notes (Signed)
Occupational Therapy Treatment Patient Details Name: Tami Hopkins MRN: [id number] DOB: 11-10-1960 Today's Date: 10/14/2023   History of present illness 62 y/o female presented to ED on 10/07/23 for AMS 2/2 possible drug overdose. Admitted for drug overdose, rhabdomyolysis, and aspiration pneumonia. PMH: chronic pain syndrome, PTSD, COPD   OT comments  Pt seen for OT tx and cotx with PT for safety. Pt demonstrated excellent progress today, requiring only supervision for ADL transfers with RW, LB dressing, and standing grooming tasks at sink and able to reach outside BOS with UE on counter to retrieve a towel with no LOB. Very limited VC for safety during session. Continues to benefit.       If plan is discharge home, recommend the following:  Assistance with cooking/housework;Direct supervision/assist for medications management;Assist for transportation;Supervision due to cognitive status;Help with stairs or ramp for entrance;Direct supervision/assist for financial management;A little help with walking and/or transfers;A little help with bathing/dressing/bathroom   Equipment Recommendations  BSC/3in1    Recommendations for Other Services      Precautions / Restrictions Precautions Precautions: Fall Restrictions Weight Bearing Restrictions Per Provider Order: No       Mobility Bed Mobility Overal bed mobility: Needs Assistance Bed Mobility: Supine to Sit     Supine to sit: Supervision          Transfers Overall transfer level: Needs assistance Equipment used: Rolling walker (2 wheels) Transfers: Sit to/from Stand Sit to Stand: Supervision                 Balance Overall balance assessment: Mild deficits observed, not formally tested                                         ADL either performed or assessed with clinical judgement   ADL Overall ADL's : Needs assistance/impaired     Grooming: Standing;Supervision/safety;Wash/dry face;Oral  care                               Functional mobility during ADLs: Supervision/safety;Rolling walker (2 wheels)      Extremity/Trunk Assessment              Vision       Perception     Praxis      Cognition Arousal: Alert Behavior During Therapy: WFL for tasks assessed/performed Overall Cognitive Status: Within Functional Limits for tasks assessed                                 General Comments: grossly WFL, minimal VC for safety during session, follows commands well        Exercises      Shoulder Instructions       General Comments      Pertinent Vitals/ Pain       Pain Assessment Pain Assessment: 0-10 Pain Score: 5  Pain Location: RLE Pain Descriptors / Indicators: Spasm Pain Intervention(s): Monitored during session, Limited activity within patient's tolerance, Repositioned  Home Living                                          Prior Functioning/Environment  Frequency  Min 1X/week        Progress Toward Goals  OT Goals(current goals can now be found in the care plan section)  Progress towards OT goals: Progressing toward goals  Acute Rehab OT Goals Patient Stated Goal: get stronger OT Goal Formulation: With patient Time For Goal Achievement: 10/24/23 Potential to Achieve Goals: Good  Plan      Co-evaluation    PT/OT/SLP Co-Evaluation/Treatment: Yes Reason for Co-Treatment: For patient/therapist safety PT goals addressed during session: Mobility/safety with mobility;Balance;Proper use of DME;Strengthening/ROM;Other (comment) OT goals addressed during session: ADL's and self-care      AM-PAC OT "6 Clicks" Daily Activity     Outcome Measure   Help from another person eating meals?: None Help from another person taking care of personal grooming?: None Help from another person toileting, which includes using toliet, bedpan, or urinal?: None Help from another person  bathing (including washing, rinsing, drying)?: A Little Help from another person to put on and taking off regular upper body clothing?: None Help from another person to put on and taking off regular lower body clothing?: None 6 Click Score: 23    End of Session Equipment Utilized During Treatment: Rolling walker (2 wheels)  OT Visit Diagnosis: Other abnormalities of gait and mobility (R26.89);Unsteadiness on feet (R26.81)   Activity Tolerance Patient tolerated treatment well   Patient Left in bed;with call bell/phone within reach;Other (comment) (with PT)   Nurse Communication          Time: 6433-2951 OT Time Calculation (min): 15 min  Charges: OT General Charges $OT Visit: 1 Visit OT Treatments $Self Care/Home Management : 8-22 mins  Arman Filter., MPH, MS, OTR/L ascom [phone] 10/14/23, 1:06 PM

## 2023-10-14 NOTE — Plan of Care (Signed)
  Problem: Health Behavior/Discharge Planning: Goal: Ability to manage health-related needs will improve Outcome: Progressing   Problem: Clinical Measurements: Goal: Cardiovascular complication will be avoided Outcome: Progressing   Problem: Activity: Goal: Risk for activity intolerance will decrease Outcome: Progressing   Problem: Safety: Goal: Ability to remain free from injury will improve Outcome: Progressing   

## 2023-10-14 NOTE — TOC Transition Note (Signed)
Transition of Care [facility]) - Discharge Note   Patient Details  Name: Tami Hopkins MRN: [id number] Date of Birth: [birth year]  [facility] CM/SW Contact:  Truddie Hidden, RN Phone Number: 10/14/2023, 11:12 AM   Clinical Narrative:    Patient discharging home.  Elnita Maxwell from [facility] notified of discharge home today.  Jon from [employer] verified RW and [facility] have been delivered to patient's room.      Barriers to Discharge: Continued Medical Work up   Patient Goals and CMS Choice            Discharge Placement                       Discharge Plan and Services Additional resources added to the After Visit Summary for       Post Acute Care Choice: NA                               Social Drivers of Health (SDOH) Interventions SDOH Screenings   Food Insecurity: Food Insecurity Present (10/10/2023)  Housing: High Risk (10/09/2023)  Transportation Needs: No Transportation Needs (10/09/2023)  Utilities: Patient Declined (10/09/2023)  Depression (PHQ2-9): Medium Risk (04/30/2023)  Financial Resource Strain: Medium Risk (09/25/2022)   Received from [facility], [facility]  Physical Activity: Insufficiently Active (01/13/2021)   Received from [facility], [facility]  Social Connections: Socially Isolated (01/13/2021)   Received from [facility], [facility]  Stress: Stress Concern Present (01/13/2021)   Received from [facility], [facility]  Tobacco Use: High Risk (10/09/2023)     Readmission Risk Interventions     No data to display

## 2023-10-14 NOTE — Progress Notes (Signed)
Physical Therapy Treatment Patient Details Name: Tami Hopkins MRN: [id number] DOB: [birth year] Today's Date: 10/14/2023   History of Present Illness 62 y/o female presented to ED on 10/07/23 for AMS 2/2 possible drug overdose. Admitted for drug overdose, rhabdomyolysis, and aspiration pneumonia. PMH: chronic pain syndrome, PTSD, COPD    PT Comments  Patient progressing towards physical therapy goals. Patient able to complete bed mobility with supervision and stand from low bed surface with CGA. Ambulated 250' with RW and CGA initially, however progressing to supervision. Negotiated 3 stairs with B handrails and supervision. Instructed patient on supine piriformis stretch to mediate R LE symptoms. Patient would benefit from STR for improving independence along with cognitive reasons, however moving well enough for HHPT.     If plan is discharge home, recommend the following: A lot of help with walking and/or transfers;A lot of help with bathing/dressing/bathroom;Assistance with cooking/housework;Direct supervision/assist for medications management;Direct supervision/assist for financial management;Assist for transportation;Help with stairs or ramp for entrance   Can travel by private vehicle     No  Equipment Recommendations  Rolling Nicholai Willette (2 wheels);BSC/3in1    Recommendations for Other Services       Precautions / Restrictions Precautions Precautions: Fall Restrictions Weight Bearing Restrictions Per Provider Order: No     Mobility  Bed Mobility Overal bed mobility: Needs Assistance Bed Mobility: Supine to Sit, Sit to Supine     Supine to sit: Supervision Sit to supine: Supervision        Transfers Overall transfer level: Needs assistance Equipment used: Rolling Latishia Suitt (2 wheels) Transfers: Sit to/from Stand Sit to Stand: Contact guard assist                Ambulation/Gait Ambulation/Gait assistance: Contact guard assist, Supervision Gait Distance  (Feet): 250 Feet Assistive device: Rolling Zayvien Canning (2 wheels) Gait Pattern/deviations: Step-through pattern, Decreased stride length Gait velocity: decreased     General Gait Details: CGA initially progressing to supervision with no LOB noted. Prefers RW at this time due to R LE pain   Stairs Stairs: Yes Stairs assistance: Supervision Stair Management: Two rails, Step to pattern, Forwards Number of Stairs: 3     Wheelchair Mobility     Tilt Bed    Modified Rankin (Stroke Patients Only)       Balance Overall balance assessment: Mild deficits observed, not formally tested                                          Cognition Arousal: Alert Behavior During Therapy: Anxious, Impulsive Overall Cognitive Status: Impaired/Different from baseline                           Safety/Judgement: Decreased awareness of safety Awareness: Emergent Problem Solving: Slow processing General Comments: poor insight into full situation and poor awareness of safety with patient inquiring about driving with only her L foot        Exercises Other Exercises Other Exercises: Instructed on supine piriformis stretch to assist with sciatic like pain    General Comments        Pertinent Vitals/Pain Pain Assessment Pain Assessment: Faces Faces Pain Scale: Hurts even more Pain Location: R LE Pain Descriptors / Indicators: Grimacing, Guarding, Burning Pain Intervention(s): Limited activity within patient's tolerance, Monitored during session, Repositioned    Home Living  Prior Function            PT Goals (current goals can now be found in the care plan section) Acute Rehab PT Goals Patient Stated Goal: to go home for Christmas PT Goal Formulation: With patient Time For Goal Achievement: 10/24/23 Potential to Achieve Goals: Good Progress towards PT goals: Progressing toward goals    Frequency    Min 1X/week       PT Plan      Co-evaluation   Reason for Co-Treatment: For patient/therapist safety PT goals addressed during session: Mobility/safety with mobility;Balance;Proper use of DME;Strengthening/ROM;Other (comment) OT goals addressed during session: ADL's and self-care      AM-PAC PT "6 Clicks" Mobility   Outcome Measure  Help needed turning from your back to your side while in a flat bed without using bedrails?: A Little Help needed moving from lying on your back to sitting on the side of a flat bed without using bedrails?: A Little Help needed moving to and from a bed to a chair (including a wheelchair)?: A Little Help needed standing up from a chair using your arms (e.g., wheelchair or bedside chair)?: A Little Help needed to walk in hospital room?: A Little Help needed climbing 3-5 steps with a railing? : A Little 6 Click Score: 18    End of Session   Activity Tolerance: Patient tolerated treatment well Patient left: in bed;with call bell/phone within reach;with bed alarm set Nurse Communication: Mobility status PT Visit Diagnosis: Unsteadiness on feet (R26.81);Muscle weakness (generalized) (M62.81);Difficulty in walking, not elsewhere classified (R26.2)     Time: 9604-5409 PT Time Calculation (min) (ACUTE ONLY): 26 min  Charges:    $Therapeutic Activity: 8-22 mins PT General Charges $$ ACUTE PT VISIT: 1 Visit                     Maylon Peppers, PT, DPT Physical Therapist - [facility]  [facility]    Kailey Esquilin A Daniele Dillow 10/14/2023, 12:50 PM

## 2023-10-14 NOTE — TOC Transition Note (Signed)
Transition of Care [facility]) - Discharge Note   Patient Details  Name: Tami Hopkins MRN: [id number] Date of Birth: [birth year]  [facility] CM/SW Contact:  Truddie Hidden, RN Phone Number: 10/14/2023, 1:37 PM   Clinical Narrative:    Spoke with patient at bedside regarding discharge today. She was advised Elnita Maxwell from [facility] has been notified of discharge and will contact her to schedule SOC. Patient daughter will transport her home. Patient RW and BSC  confirmed to be delivered by jon from [employer]. DME was delivered to patient's previous room. Nurse notified.      Barriers to Discharge: Continued Medical Work up   Patient Goals and CMS Choice            Discharge Placement                       Discharge Plan and Services Additional resources added to the After Visit Summary for       Post Acute Care Choice: NA                               Social Drivers of Health (SDOH) Interventions SDOH Screenings   Food Insecurity: Food Insecurity Present (10/10/2023)  Housing: High Risk (10/09/2023)  Transportation Needs: No Transportation Needs (10/09/2023)  Utilities: Patient Declined (10/09/2023)  Depression (PHQ2-9): Medium Risk (04/30/2023)  Financial Resource Strain: Medium Risk (09/25/2022)   Received from [facility], [facility]  Physical Activity: Insufficiently Active (01/13/2021)   Received from [facility], [facility]  Social Connections: Socially Isolated (01/13/2021)   Received from [facility], [facility]  Stress: Stress Concern Present (01/13/2021)   Received from [facility], [facility]  Tobacco Use: High Risk (10/09/2023)     Readmission Risk Interventions     No data to display

## 2023-10-14 NOTE — Progress Notes (Signed)
AVS given and reviewed with patient. Home medications given to patient. PIV removed. All questions answered.

## 2023-10-14 NOTE — Progress Notes (Signed)
Patient remained up throughout the evening Lights turned off, TV turned off, stuffed animal in hand and continues to toss and turn.  Remains anxious even with redirection. No tremors, no sweating, no hallucinations noted. Remains confused about Gabapentin scheduled times. Writer printed out times for Gabapentin. Patient requests to be toilet  hourly. Call light within reach.

## 2023-10-14 NOTE — Discharge Summary (Signed)
Physician Discharge Summary   Patient: Tami Hopkins MRN: [id number] DOB: 1961/07/26  Admit date:     10/07/2023  Discharge date: 10/14/23  Discharge Physician: Loyce Dys   PCP: Ardyth Man, PA-C   Recommendations at discharge:  Follow-up with outpatient physicians including pain physician  Discharge Diagnoses:  Drug overdose # Rhabdomyolysis # L4-5 disc herniation # Acute respiratory failure with hypoxia.  Resolved # Aspiration pneumonia # AKI (acute kidney injury) Resolved  # Transaminitis # Hypokalemia, potassium repleted. # Magnesium, # Hypophosphatemia,  # Chronic obstructive pulmonary disease # Chronic pain syndrome # Post-traumatic stress disorder # Renal cyst, left.    Hospital Course: Tami Hopkins is a 62 y.o. female with medical history significant of chronic pain syndrome (on Percocet, baclofen, and gabapentin), PTSD (on Klonopin), GERD, IBS, hyperlipidemia, who presents to the ED due to altered mental status. Her daughter at bedside is not able to provide additional history, but states that she has been worried about patient's medications, stating she often takes more than she is supposed to. Per chart review, EMS was called after patient's roommate found her on the bathroom floor.  ED course: On arrival to the ED, patient was normotensive at 126/92 with heart rate of 115.  She was saturating at 92% on room air but subsequently desaturated to 87% and was placed on 3 L with improvement.  She was tachycardic up to 115.  She was afebrile at 98.4.  Initial workup notable for VBG with pH of 7.37, pCO2 of 38.  CBC with WBC of 15.1, hemoglobin of 16.4.  CK elevated above 50,000.  CMP with potassium of 3.1, bicarb 20, anion gap 17, AST 710, ALT 255, creatinine 1.87 and GFR of 30.  Lactic acid 3.0.  UDS positive for benzos, opioids, and tricyclic's.  UA with hematuria but no RBC; many bacteria, calcium oxalate, and WBC clumps noted.  Alcohol level negative.   Chest x-ray with consolidation in the right lung base.  CT head negative.  CT renal stone study with no obstructing stones.  Patient started on IV fluids and levofloxacin.  TRH contacted for admission.  Patient was also treated with antibiotics with improvement.  Patient returned to her baseline and sedating medications/pain medications were adjusted and patient was counseled extensively.  Patient was also seen by neurosurgeon and was recommended for surgery for her back pain however patient repeatedly declined surgery since 2019.  Patient was also seen by psychiatry and was not deemed to meet inpatient psych admission.  I had extensive discussion with patient's daughter concerning close follow-up with her outpatient pain physician.  And recommended only to take the medications as directed at discharge.  Patient and daughter expressed understanding.  Patient was also recommended by PT OT for discharge to rehab, however she declined acute rehab and chose to go home with home health.  Consultants: As mentioned above Procedures performed: None Disposition: Home health Diet recommendation:  Cardiac diet DISCHARGE MEDICATION: Allergies as of 10/14/2023       Reactions   Penicillins Anaphylaxis   Any "cillins"   Sulfa Antibiotics Anaphylaxis   Amitriptyline    Can not take with other medications  Can not take with other medications  Can not take with other medications    Bupropion    Can not take with other combination medications  Can not take with other combination medications  Can not take with other combination medications    Doxepin    Can not take with other medications  Can not take with other medications  Can not take with other medications    Moxifloxacin Other (See Comments)        Medication List     STOP taking these medications    baclofen 10 MG tablet Commonly known as: LIORESAL   clonazePAM 1 MG tablet Commonly known as: KLONOPIN   promethazine 25 MG  tablet Commonly known as: PHENERGAN       TAKE these medications    albuterol 108 (90 Base) MCG/ACT inhaler Commonly known as: VENTOLIN HFA Inhale into the lungs.   Arnuity Ellipta 200 MCG/ACT Aepb Generic drug: Fluticasone Furoate Take 1 puff by mouth daily.   atorvastatin 40 MG tablet Commonly known as: LIPITOR Take 40 mg by mouth daily.   busPIRone 10 MG tablet Commonly known as: BUSPAR Take 10 mg by mouth 4 (four) times daily.   diphenoxylate-atropine 2.5-0.025 MG tablet Commonly known as: LOMOTIL Take 1 tablet by mouth 4 (four) times daily as needed for diarrhea or loose stools.   doxycycline 100 MG tablet Commonly known as: VIBRA-TABS Take 1 tablet (100 mg total) by mouth every 12 (twelve) hours for 3 days.   Flovent HFA 110 MCG/ACT inhaler Generic drug: fluticasone Inhale 2 puffs into the lungs at bedtime.   fluticasone 50 MCG/ACT nasal spray Commonly known as: FLONASE Place 1 spray into both nostrils 2 (two) times daily.   folic acid 1 MG tablet Commonly known as: FOLVITE Take 1 tablet (1 mg total) by mouth daily. Start taking on: October 15, 2023   furosemide 40 MG tablet Commonly known as: LASIX Take 1 tablet by mouth as needed.   gabapentin 300 MG capsule Commonly known as: NEURONTIN Take 1 capsule (300 mg total) by mouth 3 (three) times daily. TAKE 1 CAPSULE BY MOUTH 4 TIMES DAILY. What changed: when to take this   ipratropium 0.06 % nasal spray Commonly known as: ATROVENT Place into the nose.   Klor-Con 10 10 MEQ tablet Generic drug: potassium chloride Take 10 mEq by mouth as needed.   methylPREDNISolone 2 MG tablet Commonly known as: MEDROL Take 2 tablets (4 mg total) by mouth daily for 1 day, THEN 1 tablet (2 mg total) daily for 1 day. Start taking on: October 15, 2023   montelukast 10 MG tablet Commonly known as: SINGULAIR Take 10 mg by mouth at bedtime.   mupirocin ointment 2 % Commonly known as: BACTROBAN APPLY 1 APPLICATION  TOPICALLY DAILY. APPLY TO WOUND AT RIGHT EAR AND COVER WITH BANDAID UNTIL HEALED   oxyCODONE-acetaminophen 5-325 MG tablet Commonly known as: Percocet Take 1 tablet by mouth every 6 (six) hours as needed for severe pain (pain score 7-10) (rib fx pain).   pantoprazole 40 MG tablet Commonly known as: PROTONIX Take 40 mg by mouth daily.   prazosin 1 MG capsule Commonly known as: MINIPRESS Take by mouth.   QUEtiapine 100 MG tablet Commonly known as: SEROQUEL Take by mouth.   saccharomyces boulardii 250 MG capsule Commonly known as: FLORASTOR Take 1 capsule (250 mg total) by mouth 2 (two) times daily for 3 days.   thiamine 100 MG tablet Commonly known as: Vitamin B-1 Take 1 tablet (100 mg total) by mouth daily. Start taking on: October 15, 2023               Durable Medical Equipment  (From admission, onward)           Start     Ordered   10/11/23 1443  For  home use only DME Walker rolling  Once       Question Answer Comment  Walker: With 5 Inch Wheels   Patient needs a walker to treat with the following condition Physical debility      10/11/23 1442   10/11/23 1443  For home use only DME 3 n 1  Once        10/11/23 1442            Follow-up Information     [facility] Follow up.   Why: They will follow up with you for your home health needs. Contact information: [address] [place] [address] [phone]                Discharge Exam: Ceasar Mons Weights   10/12/23 0428 10/13/23 0346 10/14/23 0354  Weight: 93.2 kg 92.1 kg 91.8 kg   Eyes: PERRLA ENT: Oral Mucosa Clear, moist  Neck: no JVD,  Cardiovascular: S1 and S2 Present, no Murmur,  Respiratory: Equal air entry bilaterally, no crackles, No wheezing Abdomen: Bowel Sound present, Soft and no tenderness,  Skin: no rashes Extremities: no Pedal edema, no calf tenderness Neurologic: without any new focal findings Gait not checked due to patient safety  concerns  Condition at discharge: good  The results of significant diagnostics from this hospitalization (including imaging, microbiology, ancillary and laboratory) are listed below for reference.   Imaging Studies: MR LUMBAR SPINE WO CONTRAST Result Date: 10/12/2023 CLINICAL DATA:  Low back pain.  Right lower extremity numbness. EXAM: MRI LUMBAR SPINE WITHOUT CONTRAST TECHNIQUE: Multiplanar, multisequence MR imaging of the lumbar spine was performed. No intravenous contrast was administered. COMPARISON:  06/18/2017 FINDINGS: Segmentation:  Standard. Alignment:  Right convex scoliosis with apex at L2 Vertebrae: Superior endplate Schmorl's nodes at L1 and L2, unchanged. No acute fracture. Conus medullaris and cauda equina: Conus extends to the L1 level. Conus and cauda equina appear normal. Paraspinal and other soft tissues: Edema within the left paraspinous muscles Disc levels: T12-L1: Left asymmetric disc bulge narrowing of the left lateral recess. No spinal canal stenosis centrally. Mild left foraminal stenosis, unchanged. L1-L2: Normal disc space and facet joints. No spinal canal stenosis. No neural foraminal stenosis. L2-L3: Small disc bulge. No spinal canal stenosis. No neural foraminal stenosis. L3-L4: Left asymmetric disc bulge. No spinal canal stenosis. Unchanged moderate left neural foraminal stenosis. L4-L5: Intermediate sized right asymmetric disc bulge. No spinal canal stenosis. Worsened severe right neural foraminal stenosis. L5-S1: Normal disc space and facet joints. No spinal canal stenosis. No neural foraminal stenosis. Visualized sacrum: Normal. IMPRESSION: 1. Worsened severe right L4-L5 neural foraminal stenosis due to asymmetric disc bulge. 2. Unchanged moderate left L3-L4 neural foraminal stenosis. 3. No spinal canal stenosis. 4. Edema within the left paraspinous muscles. Electronically Signed   By: Deatra Robinson M.D.   On: 10/12/2023 23:11   DG Shoulder Right Port Result Date:  10/08/2023 CLINICAL DATA:  Right shoulder pain after fall. EXAM: RIGHT SHOULDER - 1 VIEW COMPARISON:  May 10, 2015.  October 07, 2023. FINDINGS: There is no evidence of fracture or dislocation. There is no evidence of arthropathy or other focal bone abnormality. Airspace opacity seen in right lung as described on chest radiograph performed yesterday. IMPRESSION: No abnormality seen in right shoulder. Please see chest radiograph dictation from yesterday for discussion of right lung opacity. Electronically Signed   By: Lupita Raider M.D.   On: 10/08/2023 10:45   CT Renal Stone Study Result Date: 10/07/2023  CLINICAL DATA:  Abdominal and flank pain EXAM: CT ABDOMEN AND PELVIS WITHOUT CONTRAST TECHNIQUE: Multidetector CT imaging of the abdomen and pelvis was performed following the standard protocol without IV contrast. RADIATION DOSE REDUCTION: This exam was performed according to the departmental dose-optimization program which includes automated exposure control, adjustment of the mA and/or kV according to patient size and/or use of iterative reconstruction technique. COMPARISON:  CT abdomen and pelvis 04/08/2018 FINDINGS: Lower chest: No acute abnormality. Hepatobiliary: The liver is enlarged and there is diffuse fatty infiltration. Gallbladder is surgically absent. No biliary ductal dilatation. Pancreas: Unremarkable. No pancreatic ductal dilatation or surrounding inflammatory changes. Spleen: Normal in size without focal abnormality. Adrenals/Urinary Tract: There is a cyst in the superior pole of the left kidney measuring 19 mm. There are punctate right renal calculi. There is no hydronephrosis or perinephric fat stranding. The adrenal glands bladder are within normal limits. Stomach/Bowel: Stomach is within normal limits. Appendix appears normal. No evidence of bowel wall thickening, distention, or inflammatory changes. Vascular/Lymphatic: No significant vascular findings are present. No enlarged  abdominal or pelvic lymph nodes. Reproductive: Status post hysterectomy. No adnexal masses. Other: No abdominal wall hernia or abnormality. No abdominopelvic ascites. Musculoskeletal: There is scoliosis and degenerative change of the thoracolumbar spine. IMPRESSION: 1. No acute localizing process in the abdomen or pelvis. 2. Hepatomegaly with fatty infiltration of the liver. 3. Nonobstructing right renal calculi. 4. Left Bosniak I benign renal cyst measuring 1.9 cm. No follow-up imaging is recommended. JACR 2018 Feb; 264-273, Management of the Incidental Renal Mass on CT, [employer] 2021; [id number], Bosniak Classification of Cystic Renal Masses, Version 2019. Electronically Signed   By: Darliss Cheney M.D.   On: 10/07/2023 20:31   CT HEAD WO CONTRAST ( ) Result Date: 10/07/2023 CLINICAL DATA:  Altered mental status EXAM: CT HEAD WITHOUT CONTRAST TECHNIQUE: Contiguous axial images were obtained from the base of the skull through the vertex without intravenous contrast. RADIATION DOSE REDUCTION: This exam was performed according to the departmental dose-optimization program which includes automated exposure control, adjustment of the mA and/or kV according to patient size and/or use of iterative reconstruction technique. COMPARISON:  05/10/2015 FINDINGS: Brain: No evidence of acute infarction, hemorrhage, mass, mass effect, or midline shift. No hydrocephalus or extra-axial fluid collection. Small calcified lesion along the left posterior fossa appears stable from the prior exam, most likely a small meningioma (series 3, image 9). No evidence of significant mass effect. Vascular: No hyperdense vessel. Skull: Negative for fracture or focal lesion. Sinuses/Orbits: No acute finding. Other: The mastoid air cells are well aerated. IMPRESSION: No acute intracranial process. Electronically Signed   By: Wiliam Ke M.D.   On: 10/07/2023 18:07   DG Chest Portable 1 View Result Date: 10/07/2023 CLINICAL DATA:   Altered mental status. EXAM: PORTABLE CHEST 1 VIEW COMPARISON:  05/20/2023 FINDINGS: Abnormal consolidation/airspace opacity at the right lung base potentially in the right lower lobe a right middle lobe. The left lung appears clear. Levoconvex thoracic scoliosis and spondylosis. Heart size within normal limits. The patient is rotated to the right on today's radiograph, reducing diagnostic sensitivity and specificity. IMPRESSION: 1. Abnormal consolidation/airspace opacity at the right lung base potentially in the right lower lobe or right middle lobe. This is suspicious for pneumonia. Followup PA and lateral chest X-ray is recommended in 3-4 weeks following trial of antibiotic therapy to ensure resolution and exclude underlying malignancy. 2. Levoconvex thoracic scoliosis and spondylosis. Electronically Signed   By: Gaylyn Rong M.D.   On: 10/07/2023  17:12    Microbiology: Results for orders placed or performed during the hospital encounter of 10/07/23  Urine Culture     Status: Abnormal   Collection Time: 10/07/23  4:51 PM   Specimen: Urine, Catheterized  Result Value Ref Range Status   Specimen Description   Final    URINE, CATHETERIZED Performed at [facility], [address] [place], [place] [address]    Special Requests   Final    NONE Performed at [facility], [address] [place], [place] [address]    Culture (A)  Final    <10,000 COLONIES/mL INSIGNIFICANT GROWTH Performed at [facility], [address], [place], [place] [address]    Report Status 10/09/2023 FINAL  Final  Resp panel by RT-PCR (RSV, Flu A&B, Covid) Anterior Nasal Swab     Status: None   Collection Time: 10/07/23  5:48 PM   Specimen: Anterior Nasal Swab  Result Value Ref Range Status   SARS Coronavirus 2 by RT PCR NEGATIVE NEGATIVE Final    Comment: (NOTE) SARS-CoV-2 target nucleic acids are NOT DETECTED.  The SARS-CoV-2 RNA is generally detectable in upper  respiratory specimens during the acute phase of infection. The lowest concentration of SARS-CoV-2 viral copies this assay can detect is 138 copies/mL. A negative result does not preclude SARS-Cov-2 infection and should not be used as the sole basis for treatment or other patient management decisions. A negative result may occur with  improper specimen collection/handling, submission of specimen other than nasopharyngeal swab, presence of viral mutation(s) within the areas targeted by this assay, and inadequate number of viral copies(<138 copies/mL). A negative result must be combined with clinical observations, patient history, and epidemiological information. The expected result is Negative.  Fact Sheet for Patients:  BloggerCourse.com  Fact Sheet for Healthcare Providers:  SeriousBroker.it  This test is no t yet approved or cleared by the [place] FDA and  has been authorized for detection and/or diagnosis of SARS-CoV-2 by FDA under an Emergency Use Authorization (EUA). This EUA will remain  in effect (meaning this test can be used) for the duration of the COVID-19 declaration under Section 564(b)(1) of the Act, 21 U.S.C.section 360bbb-3(b)(1), unless the authorization is terminated  or revoked sooner.       Influenza A by PCR NEGATIVE NEGATIVE Final   Influenza B by PCR NEGATIVE NEGATIVE Final    Comment: (NOTE) The Xpert Xpress SARS-CoV-2/FLU/RSV plus assay is intended as an aid in the diagnosis of influenza from Nasopharyngeal swab specimens and should not be used as a sole basis for treatment. Nasal washings and aspirates are unacceptable for Xpert Xpress SARS-CoV-2/FLU/RSV testing.  Fact Sheet for Patients: BloggerCourse.com  Fact Sheet for Healthcare Providers: SeriousBroker.it  This test is not yet approved or cleared by the [place] FDA and has been  authorized for detection and/or diagnosis of SARS-CoV-2 by FDA under an Emergency Use Authorization (EUA). This EUA will remain in effect (meaning this test can be used) for the duration of the COVID-19 declaration under Section 564(b)(1) of the Act, 21 U.S.C. section 360bbb-3(b)(1), unless the authorization is terminated or revoked.     Resp Syncytial Virus by PCR NEGATIVE NEGATIVE Final    Comment: (NOTE) Fact Sheet for Patients: BloggerCourse.com  Fact Sheet for Healthcare Providers: SeriousBroker.it  This test is not yet approved or cleared by the [place] FDA and has been authorized for detection and/or diagnosis of SARS-CoV-2 by FDA under an Emergency Use Authorization (EUA). This  EUA will remain in effect (meaning this test can be used) for the duration of the COVID-19 declaration under Section 564(b)(1) of the Act, 21 U.S.C. section 360bbb-3(b)(1), unless the authorization is terminated or revoked.  Performed at [facility], [address] [place], [place] [address]   Blood Culture (routine x 2)     Status: None   Collection Time: 10/07/23  5:48 PM   Specimen: BLOOD  Result Value Ref Range Status   Specimen Description BLOOD  RAC  Final   Special Requests   Final    BOTTLES DRAWN AEROBIC AND ANAEROBIC Blood Culture adequate volume   Culture   Final    NO GROWTH 5 DAYS Performed at [facility], [address] [place], [place] [address]    Report Status 10/12/2023 FINAL  Final  Blood Culture (routine x 2)     Status: None   Collection Time: 10/09/23  7:34 PM   Specimen: BLOOD  Result Value Ref Range Status   Specimen Description BLOOD BLOOD RIGHT HAND  Final   Special Requests   Final    BOTTLES DRAWN AEROBIC AND ANAEROBIC Blood Culture adequate volume   Culture   Final    NO GROWTH 5 DAYS Performed at [facility], [address], [place], [place] [address]    Report Status  10/14/2023 FINAL  Final    Labs: CBC: Recent Labs  Lab 10/07/23 1552 10/08/23 0452 10/10/23 0637 10/11/23 0413 10/12/23 0416 10/13/23 0551 10/14/23 0553  WBC 15.1*   < > 7.9 6.3 5.5 7.1 9.3  NEUTROABS 12.9*  --   --   --   --   --   --   HGB 16.4*   < > 10.9* 11.2* 11.2* 11.6* 11.7*  HCT 48.4*   < > 32.2* 32.6* 32.6* 34.1* 35.4*  MCV 93.6   < > 93.6 93.9 92.9 93.4 95.9  PLT 279   < > 168 187 187 231 209   < > = values in this interval not displayed.   Basic Metabolic Panel: Recent Labs  Lab 10/10/23 0637 10/11/23 0413 10/12/23 0416 10/13/23 0551 10/14/23 0553  NA 138 139 139 140 139  K 3.3* 3.7 2.8* 4.4 3.9  CL 112* 109 106 106 101  CO2 18* 23 25 25 28   GLUCOSE 106* 97 115* 122* 103*  BUN 16 12 12 12 17   CREATININE 1.19* 0.99 0.86 0.94 0.98  CALCIUM 8.3* 8.6* 8.3* 9.3 9.8  MG 1.9 1.6* 1.7 2.0 2.0  PHOS 2.2* 1.7* 3.0 2.2* 2.9   Liver Function Tests: Recent Labs  Lab 10/09/23 0859 10/10/23 0637 10/11/23 0413 10/13/23 0551 10/14/23 0553  AST 448* 260* 185* 91* 70*  ALT 210* 168* 143* 113* 99*  ALKPHOS 64 56 55 53 50  BILITOT 0.8 0.6 0.7 0.8 1.2*  PROT 6.0* 5.6* 5.6* 6.4* 6.6  ALBUMIN 3.2* 2.8* 2.9* 3.4* 3.5   CBG: No results for input(s): "GLUCAP" in the last 168 hours.  Discharge time spent:  34 minutes.  Signed: Loyce Dys, MD [facility] 10/14/2023

## 2023-10-14 NOTE — Care Management Important Message (Signed)
Important Message  Patient Details  Name: DESERIE TONG MRN: [id number] Date of Birth: January 06, 1961   Important Message Given:  Yes - Medicare IM     Bernadette Hoit 10/14/2023, 12:24 PM

## 2023-10-17 ENCOUNTER — Telehealth: Payer: Self-pay | Admitting: Neurosurgery

## 2023-10-17 NOTE — Telephone Encounter (Signed)
will need outpatient follow up with [place].   Dr.Smith consult note on 12.22. 24. AT the time, patient is not ready to schedule due to transportation and insurance. Once she has all of her information, she will give our office a call to set up a follow up appointment

## 2023-10-21 ENCOUNTER — Encounter (INDEPENDENT_AMBULATORY_CARE_PROVIDER_SITE_OTHER): Payer: Self-pay | Admitting: Nurse Practitioner

## 2023-10-21 ENCOUNTER — Telehealth: Payer: Self-pay | Admitting: *Deleted

## 2023-10-21 NOTE — Telephone Encounter (Signed)
-----   Message from Yevette Edwards sent at 10/15/2023  3:36 PM EST ----- Regarding: FW: patient asked that we reach out  ----- Message ----- From: Lovenia Kim, MD Sent: 10/13/2023  10:18 PM EST To: Yevette Edwards, MD Subject: patient asked that we reach out                Hey Dr. [name],  I saw this lady in the hospital this weekend.  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  She has some mild weakness but she continues to want to avoid surgery at all costs.  I started her on a Medrol Dosepak for the radiculopathy, she asked that we reach out to you to see if she could see you in clinic to discuss possible injections.  Hope all is well, Brandon.

## 2023-10-21 NOTE — Telephone Encounter (Signed)
Spoke with patient, she does want to schedule a VV to discuss injections.

## 2023-10-28 ENCOUNTER — Encounter: Payer: Self-pay | Admitting: Anesthesiology

## 2023-10-28 ENCOUNTER — Ambulatory Visit: Payer: Medicare HMO | Attending: Anesthesiology | Admitting: Anesthesiology

## 2023-10-28 DIAGNOSIS — M5441 Lumbago with sciatica, right side: Secondary | ICD-10-CM

## 2023-10-28 DIAGNOSIS — M5442 Lumbago with sciatica, left side: Secondary | ICD-10-CM

## 2023-10-28 DIAGNOSIS — M542 Cervicalgia: Secondary | ICD-10-CM

## 2023-10-28 DIAGNOSIS — G894 Chronic pain syndrome: Secondary | ICD-10-CM

## 2023-10-28 DIAGNOSIS — M545 Low back pain, unspecified: Secondary | ICD-10-CM

## 2023-10-28 DIAGNOSIS — Z79891 Long term (current) use of opiate analgesic: Secondary | ICD-10-CM

## 2023-10-28 DIAGNOSIS — M25551 Pain in right hip: Secondary | ICD-10-CM

## 2023-10-28 DIAGNOSIS — G8929 Other chronic pain: Secondary | ICD-10-CM

## 2023-10-28 DIAGNOSIS — M549 Dorsalgia, unspecified: Secondary | ICD-10-CM

## 2023-10-28 DIAGNOSIS — F1190 Opioid use, unspecified, uncomplicated: Secondary | ICD-10-CM

## 2023-10-28 DIAGNOSIS — M5431 Sciatica, right side: Secondary | ICD-10-CM

## 2023-10-28 MED ORDER — OXYCODONE-ACETAMINOPHEN 5-325 MG PO TABS: 1.0000 | ORAL_TABLET | Freq: Four times a day (QID) | ORAL | 0 refills | Status: DC | PRN

## 2023-10-28 NOTE — Progress Notes (Signed)
 Virtual Visit via Telephone Note  I connected with Tami Hopkins on 10/28/23 at  8:40 AM EST by telephone and verified that I am speaking with the correct person using two identifiers.  Location: Patient: Home Provider: [facility] center   I discussed the limitations, risks, security and privacy concerns of performing an evaluation and management service by telephone and the availability of in person appointments. I also discussed with the patient that there may be a patient responsible charge related to this service. The patient expressed understanding and agreed to proceed.   History of Present Illness: I spoke with Tami Hopkins via telephone as she could not link for the video portion of the conference.  She reports that she recently fell just prior to Christmas this past December.  She sustained some injury to her right lower back that has caused a short-term hospitalization at that time.  The pain she is experiencing is an exacerbation of a longstanding pain she has had chronically.  Is a right lower back pain with radiation into the right hip buttocks and down the right posterior lateral leg.  She feels shocky like numbness and tingling affecting the right foot with calf cramping in the right side as well.  She has had giveway weakness and was seen by Dr. Penne Sharps, a neurosurgeon in the hospital at that time.  She has been home recovering since then.  He is advocated for possible epidural steroid placement and physical therapy.  She was scheduled for physical therapy a few days ago but had to cancel secondary to a recent upper respiratory illness and she did not feel good she reports.  She has been taking her oral pain medications as prescribed and these have given her some relief but she is still having severe right lower extremity pain.  She takes her gabapentin  4 times a day additionally and this helps as well in addition to the baclofen .  Despite this she is having a lot of pain  and is quite tearful during our conversation.  She does have several difficulties with seeking additional care secondary to her living circumstances.  She reports no problem with bowel or bladder dysfunction.  Her primary problem is severe pain with ambulation that has been unremitting.  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  Review of systems: General: No fevers or chills Pulmonary: No shortness of breath or dyspnea Cardiac: No angina or palpitations or lightheadedness GI: No abdominal pain or constipation Psych: No depression    Observations/Objective:  Current Outpatient Medications:    [START ON 12/05/2023] oxyCODONE -acetaminophen  (PERCOCET) 5-325 MG tablet, Take 1 tablet by mouth every 6 (six) hours as needed for moderate pain (pain score 4-6) or severe pain (pain score 7-10)., Disp: 120 tablet, Rfl: 0   albuterol  (VENTOLIN  HFA) 108 (90 Base) MCG/ACT inhaler, Inhale into the lungs., Disp: , Rfl:    ARNUITY ELLIPTA  200 MCG/ACT AEPB, Take 1 puff by mouth daily., Disp: , Rfl:    atorvastatin  (LIPITOR) 40 MG tablet, Take 40 mg by mouth daily., Disp: , Rfl:    busPIRone  (BUSPAR ) 10 MG tablet, Take 10 mg by mouth 4 (four) times daily., Disp: , Rfl:    diphenoxylate-atropine (LOMOTIL) 2.5-0.025 MG per tablet, Take 1 tablet by mouth 4 (four) times daily as needed for diarrhea or loose stools., Disp: , Rfl:    FLOVENT  HFA 110 MCG/ACT inhaler, Inhale  2 puffs into the lungs at bedtime. , Disp: , Rfl:    fluticasone  (FLONASE ) 50 MCG/ACT nasal spray, Place 1 spray into both nostrils 2 (two) times daily., Disp: , Rfl:    folic acid  (FOLVITE ) 1 MG tablet, Take 1 tablet (1 mg total) by mouth daily., Disp: 30 tablet, Rfl: 0   furosemide  (LASIX ) 40 MG tablet, Take 1 tablet by mouth as needed. , Disp: , Rfl:    gabapentin  (NEURONTIN ) 300 MG capsule, Take 1 capsule (300 mg total) by mouth 3 (three)  times daily. TAKE 1 CAPSULE BY MOUTH 4 TIMES DAILY., Disp: 90 capsule, Rfl: 3   ipratropium (ATROVENT) 0.06 % nasal spray, Place into the nose., Disp: , Rfl:    KLOR-CON  10 10 MEQ tablet, Take 10 mEq by mouth as needed. , Disp: , Rfl: 5   montelukast  (SINGULAIR ) 10 MG tablet, Take 10 mg by mouth at bedtime., Disp: , Rfl:    mupirocin  ointment (BACTROBAN ) 2 %, APPLY 1 APPLICATION TOPICALLY DAILY. APPLY TO WOUND AT RIGHT EAR AND COVER WITH BANDAID UNTIL HEALED, Disp: 22 g, Rfl: 0   [START ON 11/05/2023] oxyCODONE -acetaminophen  (PERCOCET) 5-325 MG tablet, Take 1 tablet by mouth every 6 (six) hours as needed for severe pain (pain score 7-10) (rib fx pain)., Disp: 120 tablet, Rfl: 0   pantoprazole  (PROTONIX ) 40 MG tablet, Take 40 mg by mouth daily., Disp: , Rfl:    prazosin  (MINIPRESS ) 1 MG capsule, Take by mouth., Disp: , Rfl:    QUEtiapine  (SEROQUEL ) 100 MG tablet, Take by mouth., Disp: , Rfl:    thiamine  (VITAMIN B-1) 100 MG tablet, Take 1 tablet (100 mg total) by mouth daily., Disp: 30 tablet, Rfl: 0   Past Medical History:  Diagnosis Date   Anxiety    Asthma    Bursitis of hip bilateral    Hyperlipidemia    PTSD (post-traumatic stress disorder)    Scoliosis    Assessment and Plan:  1. Chronic, continuous use of opioids   2. Chronic pain syndrome   3. Cervicalgia   4. Bilateral sciatica   5. Musculoskeletal back pain   6. Chronic hip pain, right   7. Back pain at L4-L5 level   8. Chronic right-sided low back pain with right-sided sciatica    Based on our conversation I think it is appropriate to schedule her for an epidural steroid injection.  She had 1 these back in 2018 I think is appropriate to try this.  We had a conversation about the risks and benefits of epidural steroid injections and I am hopeful that she can gain some relief and perhaps avoid surgery.  I encouraged her strongly to follow-up with physical therapy and do her exercises and stretches.  Continue with current  medication management.  I think it is reasonable to increase her evening gabapentin  dose to 2 tablets which would give her 1 in the morning 1 in the afternoon and 1 in the early evening and 2 at bedtime.  Continue with baclofen  for spasming and apply ice and heat with gradual stretching.  If her condition changes or she reports any change in motor function I have recommended she be seen by Dr. Claudene for further evaluation and consideration for possible surgical decompression if indicated.  Continue follow-up with her primary care physician for baseline medical care with scheduled epidural steroid here in the next few weeks. Follow Up Instructions:    I discussed the assessment and treatment plan with the patient. The patient was  provided an opportunity to ask questions and all were answered. The patient agreed with the plan and demonstrated an understanding of the instructions.   The patient was advised to call back or seek an in-person evaluation if the symptoms worsen or if the condition fails to improve as anticipated.  I provided 30 minutes of non-face-to-face time during this encounter.   Lynwood KANDICE Clause, MD

## 2023-10-31 ENCOUNTER — Telehealth: Payer: Self-pay | Admitting: Anesthesiology

## 2023-10-31 NOTE — Telephone Encounter (Signed)
 We need order put in for this patient to get a procedure. Alona Bene will need to authorize

## 2023-11-04 NOTE — Addendum Note (Signed)
 Addended by: Yevette Edwards on: 11/04/2023 03:04 PM   Modules accepted: Orders

## 2023-11-11 ENCOUNTER — Ambulatory Visit: Payer: Medicare HMO | Attending: Anesthesiology | Admitting: Anesthesiology

## 2023-11-11 ENCOUNTER — Institutional Professional Consult (permissible substitution): Payer: Medicare HMO | Admitting: Pulmonary Disease

## 2023-11-11 DIAGNOSIS — M5431 Sciatica, right side: Secondary | ICD-10-CM

## 2023-11-11 DIAGNOSIS — M542 Cervicalgia: Secondary | ICD-10-CM | POA: Diagnosis not present

## 2023-11-11 DIAGNOSIS — M545 Low back pain, unspecified: Secondary | ICD-10-CM

## 2023-11-11 DIAGNOSIS — G894 Chronic pain syndrome: Secondary | ICD-10-CM | POA: Diagnosis not present

## 2023-11-11 DIAGNOSIS — Z79891 Long term (current) use of opiate analgesic: Secondary | ICD-10-CM

## 2023-11-11 DIAGNOSIS — M549 Dorsalgia, unspecified: Secondary | ICD-10-CM

## 2023-11-11 DIAGNOSIS — M25512 Pain in left shoulder: Secondary | ICD-10-CM

## 2023-11-11 DIAGNOSIS — M5442 Lumbago with sciatica, left side: Secondary | ICD-10-CM | POA: Diagnosis not present

## 2023-11-11 DIAGNOSIS — F1190 Opioid use, unspecified, uncomplicated: Secondary | ICD-10-CM

## 2023-11-11 DIAGNOSIS — M25551 Pain in right hip: Secondary | ICD-10-CM

## 2023-11-11 DIAGNOSIS — M5441 Lumbago with sciatica, right side: Secondary | ICD-10-CM | POA: Diagnosis not present

## 2023-11-11 DIAGNOSIS — G8929 Other chronic pain: Secondary | ICD-10-CM

## 2023-11-11 DIAGNOSIS — Q675 Congenital deformity of spine: Secondary | ICD-10-CM

## 2023-11-11 MED ORDER — OXYCODONE-ACETAMINOPHEN 7.5-325 MG PO TABS
1.0000 | ORAL_TABLET | Freq: Four times a day (QID) | ORAL | 0 refills | Status: DC | PRN
Start: 2023-11-11 — End: 2024-01-02

## 2023-11-12 ENCOUNTER — Telehealth (INDEPENDENT_AMBULATORY_CARE_PROVIDER_SITE_OTHER): Payer: Self-pay | Admitting: Nurse Practitioner

## 2023-11-12 NOTE — Telephone Encounter (Signed)
New message   Sherrilyn Rist 10/21/2023  9:13 AM mail no show letter   Scheduling     Department/Location Provider Visit Type Status   Thu 10/10/23  8:30 AM [facility] NP [facility] No Show   Thu 10/10/23  7:30 AM [facility] No Show   Thu 09/05/23  9:30 AM [facility] Canceled   Thu 09/05/23 10:30 AM [facility] Canceled

## 2023-11-12 NOTE — Progress Notes (Signed)
Virtual Visit via Telephone Note  I connected with Tami Hopkins on 11/12/23 at 11:20 AM EST by telephone and verified that I am speaking with the correct person using two identifiers.  Location: Patient: Home Provider: [facility] control center   I discussed the limitations, risks, security and privacy concerns of performing an evaluation and management service by telephone and the availability of in person appointments. I also discussed with the patient that there may be a patient responsible charge related to this service. The patient expressed understanding and agreed to proceed.   History of Present Illness: I spoke with Tami Hopkins via telephone.  We could not link for the video portion of the conference but she reports that she is still having considerable amount of right anterior thigh pain comparable to what she described on her last visit.  She is scheduled for a lumbar epidural steroid here within the next several Hopkins.  She is having problems with persistent pain.  This is of the same quality characteristic and distribution as she described on her last visit.  Secondary to the severity pain she has been having more breakthrough pain.  The 5 mg opioid has been insufficient to cover the increased intensity recently.  She is due for refill today.  Otherwise she is in her usual state of health with no changes in lower extremity strength function bowel or bladder function.  Review of systems: General: No fevers or chills Pulmonary: No shortness of breath or dyspnea Cardiac: No angina or palpitations or lightheadedness GI: No abdominal pain or constipation Psych: No depression    Observations/Objective:  Current Outpatient Medications:    oxyCODONE-acetaminophen (PERCOCET) 7.5-325 MG tablet, Take 1 tablet by mouth every 6 (six) hours as needed for moderate pain (pain score 4-6) or severe pain (pain score 7-10)., Disp: 120 tablet, Rfl: 0   albuterol (VENTOLIN HFA) 108 (90 Base)  MCG/ACT inhaler, Inhale into the lungs., Disp: , Rfl:    ARNUITY ELLIPTA 200 MCG/ACT AEPB, Take 1 puff by mouth daily., Disp: , Rfl:    atorvastatin (LIPITOR) 40 MG tablet, Take 40 mg by mouth daily., Disp: , Rfl:    busPIRone (BUSPAR) 10 MG tablet, Take 10 mg by mouth 4 (four) times daily., Disp: , Rfl:    diphenoxylate-atropine (LOMOTIL) 2.5-0.025 MG per tablet, Take 1 tablet by mouth 4 (four) times daily as needed for diarrhea or loose stools., Disp: , Rfl:    FLOVENT HFA 110 MCG/ACT inhaler, Inhale 2 puffs into the lungs at bedtime. , Disp: , Rfl:    fluticasone (FLONASE) 50 MCG/ACT nasal spray, Place 1 spray into both nostrils 2 (two) times daily., Disp: , Rfl:    folic acid (FOLVITE) 1 MG tablet, Take 1 tablet (1 mg total) by mouth daily., Disp: 30 tablet, Rfl: 0   furosemide (LASIX) 40 MG tablet, Take 1 tablet by mouth as needed. , Disp: , Rfl:    gabapentin (NEURONTIN) 300 MG capsule, Take 1 capsule (300 mg total) by mouth 3 (three) times daily. TAKE 1 CAPSULE BY MOUTH 4 TIMES DAILY., Disp: 90 capsule, Rfl: 3   ipratropium (ATROVENT) 0.06 % nasal spray, Place into the nose., Disp: , Rfl:    KLOR-CON 10 10 MEQ tablet, Take 10 mEq by mouth as needed. , Disp: , Rfl: 5   montelukast (SINGULAIR) 10 MG tablet, Take 10 mg by mouth at bedtime., Disp: , Rfl:    mupirocin ointment (BACTROBAN) 2 %, APPLY 1 APPLICATION TOPICALLY DAILY. APPLY TO WOUND AT  RIGHT EAR AND COVER WITH BANDAID UNTIL HEALED, Disp: 22 g, Rfl: 0   oxyCODONE-acetaminophen (PERCOCET) 5-325 MG tablet, Take 1 tablet by mouth every 6 (six) hours as needed for severe pain (pain score 7-10) (rib fx pain)., Disp: 120 tablet, Rfl: 0   [START ON 12/05/2023] oxyCODONE-acetaminophen (PERCOCET) 5-325 MG tablet, Take 1 tablet by mouth every 6 (six) hours as needed for moderate pain (pain score 4-6) or severe pain (pain score 7-10)., Disp: 120 tablet, Rfl: 0   pantoprazole (PROTONIX) 40 MG tablet, Take 40 mg by mouth daily., Disp: , Rfl:     prazosin (MINIPRESS) 1 MG capsule, Take by mouth., Disp: , Rfl:    QUEtiapine (SEROQUEL) 100 MG tablet, Take by mouth., Disp: , Rfl:    thiamine (VITAMIN B-1) 100 MG tablet, Take 1 tablet (100 mg total) by mouth daily., Disp: 30 tablet, Rfl: 0   Past Medical History:  Diagnosis Date   Anxiety    Asthma    Bursitis of hip bilateral    Hyperlipidemia    PTSD (post-traumatic stress disorder)    Scoliosis    Assessment and Plan: 1. Chronic, continuous use of opioids   2. Chronic pain syndrome   3. Cervicalgia   4. Bilateral sciatica   5. Musculoskeletal back pain   6. Chronic hip pain, right   7. Back pain at L4-L5 level   8. Chronic right-sided low back pain with right-sided sciatica   9. Congenital scoliosis   10. Pain in joint of left shoulder    [name] was quite tearful during our discussion today as she has had problems with her housing situation.  She is currently in transition trying to find a different solution for some housing problems that she has been dealing with.  The severity of pain has been overwhelming in combination with the social issues.  As such were going to increase her dose to 7.5 mg strength to see if this can give her better pain coverage and hopefully enable her to adjust more effectively with some of these other issues.  She is scheduled for an epidural steroid here within the next few Hopkins and hopefully this will be of benefit additionally.  I have encouraged her to continue with stretching strengthening exercises.  I have also given her the number for the pain clinic front desk to see if there are any other social support networks and encouraged her to contact the hospital to see if they could be of assistance.  Continue follow-up with her primary care physicians for baseline medical care.  Follow Up Instructions:    I discussed the assessment and treatment plan with the patient. The patient was provided an opportunity to ask questions and all were answered.  The patient agreed with the plan and demonstrated an understanding of the instructions.   The patient was advised to call back or seek an in-person evaluation if the symptoms worsen or if the condition fails to improve as anticipated.  I provided 30 minutes of non-face-to-face time during this encounter.   Yevette Edwards, MD

## 2023-11-21 ENCOUNTER — Other Ambulatory Visit: Payer: Self-pay | Admitting: Anesthesiology

## 2023-11-21 ENCOUNTER — Encounter: Payer: Self-pay | Admitting: Anesthesiology

## 2023-11-21 ENCOUNTER — Ambulatory Visit: Payer: Medicare HMO | Admitting: Anesthesiology

## 2023-11-21 ENCOUNTER — Ambulatory Visit
Admission: RE | Admit: 2023-11-21 | Discharge: 2023-11-21 | Disposition: A | Discharge status: Home / Self Care | Payer: Medicare HMO | Source: Ambulatory Visit | Attending: Anesthesiology | Admitting: Anesthesiology

## 2023-11-21 VITALS — BP 125/108 | HR 96 | Temp 98.1°F | Resp 16 | Ht 68.0 in | Wt 178.0 lb

## 2023-11-21 DIAGNOSIS — M5431 Sciatica, right side: Secondary | ICD-10-CM | POA: Insufficient documentation

## 2023-11-21 DIAGNOSIS — M549 Dorsalgia, unspecified: Secondary | ICD-10-CM

## 2023-11-21 DIAGNOSIS — M545 Low back pain, unspecified: Secondary | ICD-10-CM

## 2023-11-21 DIAGNOSIS — M5441 Lumbago with sciatica, right side: Secondary | ICD-10-CM | POA: Diagnosis present

## 2023-11-21 DIAGNOSIS — R52 Pain, unspecified: Secondary | ICD-10-CM

## 2023-11-21 DIAGNOSIS — M25551 Pain in right hip: Secondary | ICD-10-CM | POA: Insufficient documentation

## 2023-11-21 DIAGNOSIS — M5442 Lumbago with sciatica, left side: Secondary | ICD-10-CM | POA: Diagnosis not present

## 2023-11-21 DIAGNOSIS — G8929 Other chronic pain: Secondary | ICD-10-CM | POA: Insufficient documentation

## 2023-11-21 DIAGNOSIS — M5432 Sciatica, left side: Secondary | ICD-10-CM | POA: Insufficient documentation

## 2023-11-21 DIAGNOSIS — M542 Cervicalgia: Secondary | ICD-10-CM

## 2023-11-21 DIAGNOSIS — G894 Chronic pain syndrome: Secondary | ICD-10-CM | POA: Insufficient documentation

## 2023-11-21 DIAGNOSIS — F1190 Opioid use, unspecified, uncomplicated: Secondary | ICD-10-CM

## 2023-11-21 MED ORDER — TRIAMCINOLONE ACETONIDE 40 MG/ML IJ SUSP
40.0000 mg | Freq: Once | INTRAMUSCULAR | Status: AC
  Administered 2023-11-21: 40 mg

## 2023-11-21 MED ORDER — IOHEXOL 180 MG/ML  SOLN
INTRAMUSCULAR | Status: AC
Start: 2023-11-21 — End: ?
  Filled 2023-11-21: qty 20

## 2023-11-21 MED ORDER — ROPIVACAINE HCL 2 MG/ML IJ SOLN
INTRAMUSCULAR | Status: AC
  Filled 2023-11-21: qty 20

## 2023-11-21 MED ORDER — LIDOCAINE HCL (PF) 1 % IJ SOLN
5.0000 mL | Freq: Once | INTRAMUSCULAR | Status: AC
  Administered 2023-11-21: 5 mL via SUBCUTANEOUS

## 2023-11-21 MED ORDER — SODIUM CHLORIDE 0.9% FLUSH
10.0000 mL | Freq: Once | INTRAVENOUS | Status: AC
  Administered 2023-11-21: 10 mL

## 2023-11-21 MED ORDER — ROPIVACAINE HCL 2 MG/ML IJ SOLN
10.0000 mL | Freq: Once | INTRAMUSCULAR | Status: AC
  Administered 2023-11-21: 20 mL via EPIDURAL

## 2023-11-21 MED ORDER — SODIUM CHLORIDE (PF) 0.9 % IJ SOLN
INTRAMUSCULAR | Status: AC
  Filled 2023-11-21: qty 10

## 2023-11-21 MED ORDER — TRIAMCINOLONE ACETONIDE 40 MG/ML IJ SUSP
INTRAMUSCULAR | Status: AC
  Filled 2023-11-21: qty 1

## 2023-11-21 MED ORDER — LACTATED RINGERS IV SOLN
INTRAVENOUS | Status: AC
Start: 2023-11-21 — End: 2023-11-21

## 2023-11-21 MED ORDER — LIDOCAINE HCL (PF) 1 % IJ SOLN
INTRAMUSCULAR | Status: AC
  Filled 2023-11-21: qty 5

## 2023-11-21 MED ORDER — IOHEXOL 180 MG/ML  SOLN
10.0000 mL | Freq: Once | INTRAMUSCULAR | Status: AC | PRN
  Administered 2023-11-21: 10 mL via EPIDURAL

## 2023-11-21 MED ORDER — MIDAZOLAM HCL 2 MG/2ML IJ SOLN: 2.0000 mg | Freq: Once | INTRAMUSCULAR | Status: DC

## 2023-11-21 NOTE — Progress Notes (Signed)
Safety precautions to be maintained throughout the outpatient stay will include: orient to surroundings, keep bed in low position, maintain call bell within reach at all times, provide assistance with transfer out of bed and ambulation.

## 2023-11-21 NOTE — Patient Instructions (Signed)
______________________________________________________________________    Post-Procedure Discharge Instructions  Instructions: Apply ice:  Purpose: This will minimize any swelling and discomfort after procedure.  When: Day of procedure, as soon as you get home. How: Fill a plastic sandwich bag with crushed ice. Cover it with a small towel and apply to injection site. How long: (15 min on, 15 min off) Apply for 15 minutes then remove x 15 minutes.  Repeat sequence on day of procedure, until you go to bed. Apply heat:  Purpose: To treat any soreness and discomfort from the procedure. When: Starting the next day after the procedure. How: Apply heat to procedure site starting the day following the procedure. How long: May continue to repeat daily, until discomfort goes away. Food intake: Start with clear liquids (like water) and advance to regular food, as tolerated.  Physical activities: Keep activities to a minimum for the first 8 hours after the procedure. After that, then as tolerated. Driving: If you have received any sedation, be responsible and do not drive. You are not allowed to drive for 24 hours after having sedation. Blood thinner: (Applies only to those taking blood thinners) You may restart your blood thinner 6 hours after your procedure. Insulin: (Applies only to Diabetic patients taking insulin) As soon as you can eat, you may resume your normal dosing schedule. Infection prevention: Keep procedure site clean and dry. Shower daily and clean area with soap and water. Post-procedure Pain Diary: Extremely important that this be done correctly and accurately. Recorded information will be used to determine the next step in treatment. For the purpose of accuracy, follow these rules: Evaluate only the area treated. Do not report or include pain from an untreated area. For the purpose of this evaluation, ignore all other areas of pain, except for the treated area. After your procedure,  avoid taking a long nap and attempting to complete the pain diary after you wake up. Instead, set your alarm clock to go off every hour, on the hour, for the initial 8 hours after the procedure. Document the duration of the numbing medicine, and the relief you are getting from it. Do not go to sleep and attempt to complete it later. It will not be accurate. If you received sedation, it is likely that you were given a medication that may cause amnesia. Because of this, completing the diary at a later time may cause the information to be inaccurate. This information is needed to plan your care. Follow-up appointment: Keep your post-procedure follow-up evaluation appointment after the procedure (usually 2 weeks for most procedures, 6 weeks for radiofrequencies). DO NOT FORGET to bring you pain diary with you.   Expect: (What should I expect to see with my procedure?) From numbing medicine (AKA: Local Anesthetics): Numbness or decrease in pain. You may also experience some weakness, which if present, could last for the duration of the local anesthetic. Onset: Full effect within 15 minutes of injected. Duration: It will depend on the type of local anesthetic used. On the average, 1 to 8 hours.  From steroids (Applies only if steroids were used): Decrease in swelling or inflammation. Once inflammation is improved, relief of the pain will follow. Onset of benefits: Depends on the amount of swelling present. The more swelling, the longer it will take for the benefits to be seen. In some cases, up to 10 days. Duration: Steroids will stay in the system x 2 weeks. Duration of benefits will depend on multiple posibilities including persistent irritating factors. Side-effects: If  present, they may typically last 2 weeks (the duration of the steroids). Frequent: Cramps (if they occur, drink Gatorade and take over-the-counter Magnesium 450-500 mg once to twice a day); water retention with temporary weight gain;  increases in blood sugar; decreased immune system response; increased appetite. Occasional: Facial flushing (red, warm cheeks); mood swings; menstrual changes. Uncommon: Long-term decrease or suppression of natural hormones; bone thinning. (These are more common with higher doses or more frequent use. This is why we prefer that our patients avoid having any injection therapies in other practices.)  Very Rare: Severe mood changes; psychosis; aseptic necrosis. From procedure: Some discomfort is to be expected once the numbing medicine wears off. This should be minimal if ice and heat are applied as instructed.  Call if: (When should I call?) You experience numbness and weakness that gets worse with time, as opposed to wearing off. New onset bowel or bladder incontinence. (Applies only to procedures done in the spine)  Emergency Numbers: Durning business hours (Monday - Thursday, 8:00 AM - 4:00 PM) (Friday, 9:00 AM - 12:00 Noon): [phone] After hours: [phone] NOTE: If you are having a problem and are unable connect with, or to talk to a provider, then go to your nearest urgent care or emergency department. If the problem is serious and urgent, please call 911. ______________________________________________________________________      ______________________________________________________________________    Preparing for your procedure  Appointments: If you think you may not be able to keep your appointment, call 24-48 hours in advance to cancel. We need time to make it available to others.  Procedure visits are for procedures only. During your procedure appointment there will be: NO Prescription Refills*. NO medication changes or discussions*. NO discussion of disability issues*. NO unrelated pain problem evaluations*. NO evaluations to order other pain procedures*. *These will be addressed at a separate and distinct evaluation encounter on the provider's evaluation schedule  and not during procedure days.  Instructions: Food intake: Avoid eating anything solid for at least 8 hours prior to your procedure. Clear liquid intake: You may take clear liquids such as water up to 2 hours prior to your procedure. (No carbonated drinks. No soda.) Transportation: Unless otherwise stated by your physician, bring a driver. (Driver cannot be a Market researcher, Pharmacist, community, or any other form of public transportation.) Morning Medicines: Except for blood thinners, take all of your other morning medications with a sip of water. Make sure to take your heart and blood pressure medicines. If your blood pressure's lower number is above 100, the case will be rescheduled. Blood thinners: Make sure to stop your blood thinners as instructed.  If you take a blood thinner, but were not instructed to stop it, call our office [phone] and ask to talk to a nurse. Not stopping a blood thinner prior to certain procedures could lead to serious complications. Diabetics on insulin: Notify the staff so that you can be scheduled 1st case in the morning. If your diabetes requires high dose insulin, take only  of your normal insulin dose the morning of the procedure and notify the staff that you have done so. Preventing infections: Shower with an antibacterial soap the morning of your procedure.  Build-up your immune system: Take 1000 mg of Vitamin C with every meal (3 times a day) the day prior to your procedure. Antibiotics: Inform the nursing staff if you are taking any antibiotics or if you have any conditions that may require antibiotics prior to procedures. (Example: recent joint  implants)   Pregnancy: If you are pregnant make sure to notify the nursing staff. Not doing so may result in injury to the fetus, including death.  Sickness: If you have a cold, fever, or any active infections, call and cancel or reschedule your procedure. Receiving steroids while having an infection may result in complications. Arrival:  You must be in the facility at least 30 minutes prior to your scheduled procedure. Tardiness: Your scheduled time is also the cutoff time. If you do not arrive at least 15 minutes prior to your procedure, you will be rescheduled.  Children: Do not bring any children with you. Make arrangements to keep them home. Dress appropriately: There is always a possibility that your clothing may get soiled. Avoid long dresses. Valuables: Do not bring any jewelry or valuables.  Reasons to call and reschedule or cancel your procedure: (Following these recommendations will minimize the risk of a serious complication.) Surgeries: Avoid having procedures within 2 weeks of any surgery. (Avoid for 2 weeks before or after any surgery). Flu Shots: Avoid having procedures within 2 weeks of a flu shots or . (Avoid for 2 weeks before or after immunizations). Barium: Avoid having a procedure within 7-10 days after having had a radiological study involving the use of radiological contrast. (Myelograms, Barium swallow or enema study). Heart attacks: Avoid any elective procedures or surgeries for the initial 6 months after a "Myocardial Infarction" (Heart Attack). Blood thinners: It is imperative that you stop these medications before procedures. Let us know if you if you take any blood thinner.  Infection: Avoid procedures during or within two weeks of an infection (including chest colds or gastrointestinal problems). Symptoms associated with infections include: Localized redness, fever, chills, night sweats or profuse sweating, burning sensation when voiding, cough, congestion, stuffiness, runny nose, sore throat, diarrhea, nausea, vomiting, cold or Flu symptoms, recent or current infections. It is specially important if the infection is over the area that we intend to treat. Heart and lung problems: Symptoms that may suggest an active cardiopulmonary problem include: cough, chest pain, breathing difficulties or shortness of  breath, dizziness, ankle swelling, uncontrolled high or unusually low blood pressure, and/or palpitations. If you are experiencing any of these symptoms, cancel your procedure and contact your primary care physician for an evaluation.  Remember:  Regular Business hours are:  Monday to Thursday 8:00 AM to 4:00 PM  Provider's Schedule: Delano Metz, MD:  Procedure days: Tuesday and Thursday 7:30 AM to 4:00 PM  Edward Jolly, MD:  Procedure days: Monday and Wednesday 7:30 AM to 4:00 PM Last  Updated: 10/01/2023 ______________________________________________________________________

## 2023-11-21 NOTE — Progress Notes (Signed)
Subjective:  Patient ID: Tami Hopkins, female    DOB: May 25, 1961  Age: 63 y.o. MRN: [id number]  CC: Leg Pain (Right leg from the knee down)   Procedure: L5-S1 lumbar epidural steroid under fluoroscopic guidance with no sedation  HPI Tami Hopkins presents for evaluation.  She is having a lot of right lower extremity posterior lateral leg pain with calf cramping that has caused her to be quite miserable.  She is often tearful secondary to the chronicity and severity of the pain.  She reports being limited in her activity with this sciatica symptom and cannot do her stretching strengthening exercises.  She takes her chronic opioid therapy to help with the long-term pain management issue but this is insufficient to keep her pain under good control.  Unfortunately Tami Hopkins has been quite unhappy and in severe pain that has failed conservative therapy with physical therapy modality stretching exercises and conservative care.  She has had some giveaway weakness on the right leg.  Bowel and bladder function has been at baseline.  Outpatient Medications Prior to Visit  Medication Sig Dispense Refill   albuterol (VENTOLIN HFA) 108 (90 Base) MCG/ACT inhaler Inhale into the lungs.     ARNUITY ELLIPTA 200 MCG/ACT AEPB Take 1 puff by mouth daily.     atorvastatin (LIPITOR) 40 MG tablet Take 40 mg by mouth daily.     busPIRone (BUSPAR) 10 MG tablet Take 10 mg by mouth 4 (four) times daily.     diphenoxylate-atropine (LOMOTIL) 2.5-0.025 MG per tablet Take 1 tablet by mouth 4 (four) times daily as needed for diarrhea or loose stools.     FLOVENT HFA 110 MCG/ACT inhaler Inhale 2 puffs into the lungs at bedtime.      fluticasone (FLONASE) 50 MCG/ACT nasal spray Place 1 spray into both nostrils 2 (two) times daily.     folic acid (FOLVITE) 1 MG tablet Take 1 tablet (1 mg total) by mouth daily. 30 tablet 0   furosemide (LASIX) 40 MG tablet Take 1 tablet by mouth as needed.      gabapentin (NEURONTIN) 300  MG capsule Take 1 capsule (300 mg total) by mouth 3 (three) times daily. TAKE 1 CAPSULE BY MOUTH 4 TIMES DAILY. 90 capsule 3   KLOR-CON 10 10 MEQ tablet Take 10 mEq by mouth as needed.   5   montelukast (SINGULAIR) 10 MG tablet Take 10 mg by mouth at bedtime.     mupirocin ointment (BACTROBAN) 2 % APPLY 1 APPLICATION TOPICALLY DAILY. APPLY TO WOUND AT RIGHT EAR AND COVER WITH BANDAID UNTIL HEALED 22 g 0   oxyCODONE-acetaminophen (PERCOCET) 5-325 MG tablet Take 1 tablet by mouth every 6 (six) hours as needed for severe pain (pain score 7-10) (rib fx pain). 120 tablet 0   [START ON 12/05/2023] oxyCODONE-acetaminophen (PERCOCET) 5-325 MG tablet Take 1 tablet by mouth every 6 (six) hours as needed for moderate pain (pain score 4-6) or severe pain (pain score 7-10). 120 tablet 0   oxyCODONE-acetaminophen (PERCOCET) 7.5-325 MG tablet Take 1 tablet by mouth every 6 (six) hours as needed for moderate pain (pain score 4-6) or severe pain (pain score 7-10). 120 tablet 0   pantoprazole (PROTONIX) 40 MG tablet Take 40 mg by mouth daily.     prazosin (MINIPRESS) 1 MG capsule Take by mouth.     QUEtiapine (SEROQUEL) 100 MG tablet Take by mouth.     thiamine (VITAMIN B-1) 100 MG tablet Take 1 tablet (100 mg total) by  mouth daily. 30 tablet 0   ipratropium (ATROVENT) 0.06 % nasal spray Place into the nose.     No facility-administered medications prior to visit.    Review of Systems CNS: No confusion or sedation Cardiac: No angina or palpitations GI: No abdominal pain or constipation Constitutional: No nausea vomiting fevers or chills  Objective:  BP (!) 125/108   Pulse 96   Temp 98.1 F (36.7 C)   Resp 16   Ht 5\' 8"  (1.727 m)   Wt 178 lb (80.7 kg)   SpO2 97%   BMI 27.06 kg/m    BP Readings from Last 3 Encounters:  11/21/23 (!) 125/108  10/14/23 (!) 148/102  05/20/23 (!) 157/112     Wt Readings from Last 3 Encounters:  11/21/23 178 lb (80.7 kg)  10/14/23 202 lb 6.1 oz (91.8 kg)  05/20/23  184 lb 1.4 oz (83.5 kg)     Physical Exam Pt is alert and oriented PERRL EOMI HEART IS RRR no murmur or rub LCTA no wheezing or rales MUSCULOSKELETAL reveals some paraspinous muscle tenderness in the lower back but no overt trigger points.  She does have a positive straight leg raise on the right side.  She has an antalgic gait  Labs  No results found for: "HGBA1C" Lab Results  Component Value Date   CREATININE 0.98 10/14/2023    -------------------------------------------------------------------------------------------------------------------- Lab Results  Component Value Date   WBC 9.3 10/14/2023   HGB 11.7 (L) 10/14/2023   HCT 35.4 (L) 10/14/2023   PLT 209 10/14/2023   GLUCOSE 103 (H) 10/14/2023   ALT 99 (H) 10/14/2023   AST 70 (H) 10/14/2023   NA 139 10/14/2023   K 3.9 10/14/2023   CL 101 10/14/2023   CREATININE 0.98 10/14/2023   BUN 17 10/14/2023   CO2 28 10/14/2023   TSH 0.912 05/11/2015    --------------------------------------------------------------------------------------------------------------------- DG PAIN CLINIC C-ARM 1-60 MIN NO REPORT Result Date: 11/21/2023 Fluoro was used, but no Radiologist interpretation will be provided. Please refer to "NOTES" tab for provider progress note.    Assessment & Plan:   Tami Hopkins was seen today for leg pain.  Diagnoses and all orders for this visit:  Chronic, continuous use of opioids  Bilateral sciatica -     Lumbar Epidural Injection  Chronic hip pain, right -     Lumbar Epidural Injection  Back pain at L4-L5 level -     Lumbar Epidural Injection  Chronic right-sided low back pain with right-sided sciatica -     Lumbar Epidural Injection  Chronic pain syndrome  Cervicalgia  Musculoskeletal back pain  Other orders -     triamcinolone acetonide (KENALOG-40) injection 40 mg -     sodium chloride flush (NS) 0.9 % injection 10 mL -     ropivacaine (PF) 2 mg/mL (0.2%) (NAROPIN) injection 10 mL -      midazolam (VERSED) injection 2 mg -     lidocaine (PF) (XYLOCAINE) 1 % injection 5 mL -     lactated ringers infusion -     iohexol (OMNIPAQUE) 180 MG/ML injection 10 mL        ----------------------------------------------------------------------------------------------------------------------  Problem List Items Addressed This Visit       Unprioritized   Chronic hip pain, right (Chronic)   Chronic pain syndrome (Chronic)   Chronic right-sided low back pain with right-sided sciatica (Chronic)   Relevant Medications   midazolam (VERSED) injection 2 mg   Cervicalgia   Other Visit Diagnoses  Chronic, continuous use of opioids    -  Primary     Bilateral sciatica       Relevant Medications   midazolam (VERSED) injection 2 mg     Back pain at L4-L5 level       Relevant Medications   triamcinolone acetonide (KENALOG-40) injection 40 mg (Completed)     Musculoskeletal back pain       Relevant Medications   triamcinolone acetonide (KENALOG-40) injection 40 mg (Completed)         ----------------------------------------------------------------------------------------------------------------------  1. Bilateral sciatica Will proceed with an epidural today.  Will do this at L5-S1.  I gone over the risks and benefits of the procedure with her in full detail all questions are answered.  Schedule her for a 1 month return to clinic possible repeat epidural at that time. - Lumbar Epidural Injection  2. Chronic hip pain, right As above - Lumbar Epidural Injection  3. Back pain at L4-L5 level As above - Lumbar Epidural Injection  4. Chronic right-sided low back pain with right-sided sciatica  - Lumbar Epidural Injection  5. Chronic, continuous use of opioids (Primary) Continue current medication management.  6. Chronic pain syndrome   7. Cervicalgia   8. Musculoskeletal back  pain     ----------------------------------------------------------------------------------------------------------------------  I am having Tami Hopkins maintain her diphenoxylate-atropine, montelukast, furosemide, fluticasone, Klor-Con 10, Flovent HFA, ipratropium, QUEtiapine, mupirocin ointment, atorvastatin, albuterol, busPIRone, Arnuity Ellipta, prazosin, pantoprazole, folic acid, gabapentin, thiamine, oxyCODONE-acetaminophen, oxyCODONE-acetaminophen, and oxyCODONE-acetaminophen. We administered triamcinolone acetonide, sodium chloride flush, ropivacaine (PF) 2 mg/mL (0.2%), lidocaine (PF), and iohexol.   Meds ordered this encounter  Medications   triamcinolone acetonide (KENALOG-40) injection 40 mg   sodium chloride flush (NS) 0.9 % injection 10 mL   ropivacaine (PF) 2 mg/mL (0.2%) (NAROPIN) injection 10 mL   midazolam (VERSED) injection 2 mg   lidocaine (PF) (XYLOCAINE) 1 % injection 5 mL   lactated ringers infusion   iohexol (OMNIPAQUE) 180 MG/ML injection 10 mL   Patient's Medications  New Prescriptions   No medications on file  Previous Medications   ALBUTEROL (VENTOLIN HFA) 108 (90 BASE) MCG/ACT INHALER    Inhale into the lungs.   ARNUITY ELLIPTA 200 MCG/ACT AEPB    Take 1 puff by mouth daily.   ATORVASTATIN (LIPITOR) 40 MG TABLET    Take 40 mg by mouth daily.   BUSPIRONE (BUSPAR) 10 MG TABLET    Take 10 mg by mouth 4 (four) times daily.   DIPHENOXYLATE-ATROPINE (LOMOTIL) 2.5-0.025 MG PER TABLET    Take 1 tablet by mouth 4 (four) times daily as needed for diarrhea or loose stools.   FLOVENT HFA 110 MCG/ACT INHALER    Inhale 2 puffs into the lungs at bedtime.    FLUTICASONE (FLONASE) 50 MCG/ACT NASAL SPRAY    Place 1 spray into both nostrils 2 (two) times daily.   FOLIC ACID (FOLVITE) 1 MG TABLET    Take 1 tablet (1 mg total) by mouth daily.   FUROSEMIDE (LASIX) 40 MG TABLET    Take 1 tablet by mouth as needed.    GABAPENTIN (NEURONTIN) 300 MG CAPSULE    Take 1  capsule (300 mg total) by mouth 3 (three) times daily. TAKE 1 CAPSULE BY MOUTH 4 TIMES DAILY.   IPRATROPIUM (ATROVENT) 0.06 % NASAL SPRAY    Place into the nose.   KLOR-CON 10 10 MEQ TABLET    Take 10 mEq by mouth as needed.    MONTELUKAST (SINGULAIR) 10 MG  TABLET    Take 10 mg by mouth at bedtime.   MUPIROCIN OINTMENT (BACTROBAN) 2 %    APPLY 1 APPLICATION TOPICALLY DAILY. APPLY TO WOUND AT RIGHT EAR AND COVER WITH BANDAID UNTIL HEALED   OXYCODONE-ACETAMINOPHEN (PERCOCET) 5-325 MG TABLET    Take 1 tablet by mouth every 6 (six) hours as needed for severe pain (pain score 7-10) (rib fx pain).   OXYCODONE-ACETAMINOPHEN (PERCOCET) 5-325 MG TABLET    Take 1 tablet by mouth every 6 (six) hours as needed for moderate pain (pain score 4-6) or severe pain (pain score 7-10).   OXYCODONE-ACETAMINOPHEN (PERCOCET) 7.5-325 MG TABLET    Take 1 tablet by mouth every 6 (six) hours as needed for moderate pain (pain score 4-6) or severe pain (pain score 7-10).   PANTOPRAZOLE (PROTONIX) 40 MG TABLET    Take 40 mg by mouth daily.   PRAZOSIN (MINIPRESS) 1 MG CAPSULE    Take by mouth.   QUETIAPINE (SEROQUEL) 100 MG TABLET    Take by mouth.   THIAMINE (VITAMIN B-1) 100 MG TABLET    Take 1 tablet (100 mg total) by mouth daily.  Modified Medications   No medications on file  Discontinued Medications   No medications on file   ----------------------------------------------------------------------------------------------------------------------  Follow-up: Return in about 1 month (around 12/20/2023) for evaluation, med refill.   Procedure: L5-S1 LESI with fluoroscopic guidance and without moderate sedation  NOTE: The risks, benefits, and expectations of the procedure have been discussed and explained to the patient who was understanding and in agreement with suggested treatment plan. No guarantees were made.  DESCRIPTION OF PROCEDURE: Lumbar epidural steroid injection with no IV Versed, EKG, blood pressure, pulse, and  pulse oximetry monitoring. The procedure was performed with the patient in the prone position under fluoroscopic guidance.  Sterile prep x3 was initiated and I then injected subcutaneous lidocaine to the overlying 5 S1 site after its fluoroscopic identifictation.  Using strict aseptic technique, I then advanced an 18-gauge Tuohy epidural needle in the midline using interlaminar approach via loss-of-resistance to saline technique. There was negative aspiration for heme or  CSF.  I then confirmed position with both AP and Lateral fluoroscan.  2 cc of contrast dye were injected and a  total of 5 mL of Preservative-Free normal saline mixed with 40 mg of Kenalog and 1cc Ropicaine 0.2 percent were injected incrementally via the  epidurally placed needle. The needle was removed. The patient tolerated the injection well and was convalesced and discharged to home in stable condition. Should the patient have any post procedure difficulty they have been instructed on how to contact us for assistance.   Yevette Edwards, MD

## 2023-11-22 ENCOUNTER — Telehealth: Payer: Self-pay | Admitting: *Deleted

## 2023-11-22 NOTE — Telephone Encounter (Signed)
 No problems post procedure.

## 2023-11-25 ENCOUNTER — Telehealth: Payer: Self-pay | Admitting: Anesthesiology

## 2023-11-25 MED ORDER — GABAPENTIN 300 MG PO CAPS: 300.0000 mg | ORAL_CAPSULE | Freq: Four times a day (QID) | ORAL | 3 refills | Status: DC

## 2023-11-25 NOTE — Telephone Encounter (Signed)
Spoke with patient and she states she normally gets #120 of the Gabapentin.  She also states that the tingling in her leg was worse since the procedure.  Dr [name] notified of both issues.  Informed patient that it has only been 4 days since her procedure and she would need to give it some time for the steroid to start working.

## 2023-11-25 NOTE — Telephone Encounter (Signed)
Patient states she is having a lot of pain in right leg and foot. Also states her scripts were not written correctly. Please call

## 2023-11-25 NOTE — Addendum Note (Signed)
Addended by: Yevette Edwards on: 11/25/2023 08:56 AM   Modules accepted: Orders

## 2023-12-04 ENCOUNTER — Encounter: Payer: Self-pay | Admitting: Emergency Medicine

## 2023-12-04 ENCOUNTER — Emergency Department: Payer: Medicare HMO

## 2023-12-04 ENCOUNTER — Other Ambulatory Visit: Payer: Self-pay

## 2023-12-04 ENCOUNTER — Inpatient Hospital Stay
Admission: EM | Admit: 2023-12-04 | Discharge: 2024-01-02 | DRG: 871 | Disposition: A | Discharge status: Home / Self Care | Payer: Medicare HMO | Attending: Obstetrics and Gynecology | Admitting: Obstetrics and Gynecology

## 2023-12-04 DIAGNOSIS — R32 Unspecified urinary incontinence: Secondary | ICD-10-CM | POA: Diagnosis present

## 2023-12-04 DIAGNOSIS — R5381 Other malaise: Secondary | ICD-10-CM | POA: Diagnosis present

## 2023-12-04 DIAGNOSIS — G928 Other toxic encephalopathy: Secondary | ICD-10-CM | POA: Diagnosis present

## 2023-12-04 DIAGNOSIS — T402X1A Poisoning by other opioids, accidental (unintentional), initial encounter: Secondary | ICD-10-CM | POA: Diagnosis present

## 2023-12-04 DIAGNOSIS — N179 Acute kidney failure, unspecified: Secondary | ICD-10-CM | POA: Diagnosis present

## 2023-12-04 DIAGNOSIS — R159 Full incontinence of feces: Secondary | ICD-10-CM | POA: Diagnosis present

## 2023-12-04 DIAGNOSIS — Z833 Family history of diabetes mellitus: Secondary | ICD-10-CM | POA: Diagnosis not present

## 2023-12-04 DIAGNOSIS — J449 Chronic obstructive pulmonary disease, unspecified: Secondary | ICD-10-CM | POA: Diagnosis present

## 2023-12-04 DIAGNOSIS — M609 Myositis, unspecified: Secondary | ICD-10-CM | POA: Diagnosis present

## 2023-12-04 DIAGNOSIS — E875 Hyperkalemia: Secondary | ICD-10-CM | POA: Diagnosis present

## 2023-12-04 DIAGNOSIS — Z1152 Encounter for screening for COVID-19: Secondary | ICD-10-CM

## 2023-12-04 DIAGNOSIS — J9600 Acute respiratory failure, unspecified whether with hypoxia or hypercapnia: Secondary | ICD-10-CM | POA: Diagnosis present

## 2023-12-04 DIAGNOSIS — M5431 Sciatica, right side: Secondary | ICD-10-CM | POA: Diagnosis present

## 2023-12-04 DIAGNOSIS — K580 Irritable bowel syndrome with diarrhea: Secondary | ICD-10-CM | POA: Diagnosis present

## 2023-12-04 DIAGNOSIS — R4189 Other symptoms and signs involving cognitive functions and awareness: Principal | ICD-10-CM

## 2023-12-04 DIAGNOSIS — K2100 Gastro-esophageal reflux disease with esophagitis, without bleeding: Secondary | ICD-10-CM | POA: Diagnosis not present

## 2023-12-04 DIAGNOSIS — F1110 Opioid abuse, uncomplicated: Secondary | ICD-10-CM | POA: Diagnosis present

## 2023-12-04 DIAGNOSIS — Z818 Family history of other mental and behavioral disorders: Secondary | ICD-10-CM

## 2023-12-04 DIAGNOSIS — R519 Headache, unspecified: Secondary | ICD-10-CM | POA: Diagnosis present

## 2023-12-04 DIAGNOSIS — E7800 Pure hypercholesterolemia, unspecified: Secondary | ICD-10-CM | POA: Diagnosis present

## 2023-12-04 DIAGNOSIS — J690 Pneumonitis due to inhalation of food and vomit: Secondary | ICD-10-CM | POA: Diagnosis present

## 2023-12-04 DIAGNOSIS — F4310 Post-traumatic stress disorder, unspecified: Secondary | ICD-10-CM | POA: Diagnosis not present

## 2023-12-04 DIAGNOSIS — J9601 Acute respiratory failure with hypoxia: Secondary | ICD-10-CM | POA: Diagnosis present

## 2023-12-04 DIAGNOSIS — J9811 Atelectasis: Secondary | ICD-10-CM | POA: Diagnosis present

## 2023-12-04 DIAGNOSIS — F17200 Nicotine dependence, unspecified, uncomplicated: Secondary | ICD-10-CM | POA: Diagnosis present

## 2023-12-04 DIAGNOSIS — A419 Sepsis, unspecified organism: Principal | ICD-10-CM | POA: Diagnosis present

## 2023-12-04 DIAGNOSIS — S143XXA Injury of brachial plexus, initial encounter: Secondary | ICD-10-CM

## 2023-12-04 DIAGNOSIS — R6521 Severe sepsis with septic shock: Secondary | ICD-10-CM | POA: Diagnosis present

## 2023-12-04 DIAGNOSIS — M6281 Muscle weakness (generalized): Secondary | ICD-10-CM | POA: Diagnosis not present

## 2023-12-04 DIAGNOSIS — Z7951 Long term (current) use of inhaled steroids: Secondary | ICD-10-CM

## 2023-12-04 DIAGNOSIS — G9341 Metabolic encephalopathy: Secondary | ICD-10-CM | POA: Diagnosis not present

## 2023-12-04 DIAGNOSIS — I700 Atherosclerosis of aorta: Secondary | ICD-10-CM | POA: Diagnosis present

## 2023-12-04 DIAGNOSIS — E876 Hypokalemia: Secondary | ICD-10-CM | POA: Diagnosis not present

## 2023-12-04 DIAGNOSIS — Z79891 Long term (current) use of opiate analgesic: Secondary | ICD-10-CM

## 2023-12-04 DIAGNOSIS — M4802 Spinal stenosis, cervical region: Secondary | ICD-10-CM | POA: Diagnosis present

## 2023-12-04 DIAGNOSIS — Z596 Low income: Secondary | ICD-10-CM

## 2023-12-04 DIAGNOSIS — R4182 Altered mental status, unspecified: Secondary | ICD-10-CM | POA: Diagnosis present

## 2023-12-04 DIAGNOSIS — F17210 Nicotine dependence, cigarettes, uncomplicated: Secondary | ICD-10-CM | POA: Diagnosis present

## 2023-12-04 DIAGNOSIS — K219 Gastro-esophageal reflux disease without esophagitis: Secondary | ICD-10-CM | POA: Diagnosis present

## 2023-12-04 DIAGNOSIS — T796XXD Traumatic ischemia of muscle, subsequent encounter: Secondary | ICD-10-CM | POA: Diagnosis not present

## 2023-12-04 DIAGNOSIS — G894 Chronic pain syndrome: Secondary | ICD-10-CM | POA: Diagnosis present

## 2023-12-04 DIAGNOSIS — Z8249 Family history of ischemic heart disease and other diseases of the circulatory system: Secondary | ICD-10-CM

## 2023-12-04 DIAGNOSIS — G514 Facial myokymia: Secondary | ICD-10-CM | POA: Diagnosis not present

## 2023-12-04 DIAGNOSIS — K649 Unspecified hemorrhoids: Secondary | ICD-10-CM | POA: Diagnosis present

## 2023-12-04 DIAGNOSIS — F419 Anxiety disorder, unspecified: Secondary | ICD-10-CM | POA: Diagnosis present

## 2023-12-04 DIAGNOSIS — Z881 Allergy status to other antibiotic agents status: Secondary | ICD-10-CM

## 2023-12-04 DIAGNOSIS — T17908A Unspecified foreign body in respiratory tract, part unspecified causing other injury, initial encounter: Secondary | ICD-10-CM

## 2023-12-04 DIAGNOSIS — R609 Edema, unspecified: Secondary | ICD-10-CM | POA: Diagnosis not present

## 2023-12-04 DIAGNOSIS — E860 Dehydration: Secondary | ICD-10-CM | POA: Diagnosis present

## 2023-12-04 DIAGNOSIS — Z888 Allergy status to other drugs, medicaments and biological substances status: Secondary | ICD-10-CM

## 2023-12-04 DIAGNOSIS — Z90710 Acquired absence of both cervix and uterus: Secondary | ICD-10-CM

## 2023-12-04 DIAGNOSIS — Z882 Allergy status to sulfonamides status: Secondary | ICD-10-CM

## 2023-12-04 DIAGNOSIS — K760 Fatty (change of) liver, not elsewhere classified: Secondary | ICD-10-CM | POA: Diagnosis present

## 2023-12-04 DIAGNOSIS — Z880 Allergy status to penicillin: Secondary | ICD-10-CM

## 2023-12-04 DIAGNOSIS — Z79899 Other long term (current) drug therapy: Secondary | ICD-10-CM

## 2023-12-04 DIAGNOSIS — N200 Calculus of kidney: Secondary | ICD-10-CM | POA: Diagnosis present

## 2023-12-04 DIAGNOSIS — M419 Scoliosis, unspecified: Secondary | ICD-10-CM | POA: Diagnosis present

## 2023-12-04 DIAGNOSIS — M6282 Rhabdomyolysis: Secondary | ICD-10-CM | POA: Diagnosis present

## 2023-12-04 DIAGNOSIS — J4540 Moderate persistent asthma, uncomplicated: Secondary | ICD-10-CM | POA: Diagnosis present

## 2023-12-04 DIAGNOSIS — R531 Weakness: Secondary | ICD-10-CM | POA: Diagnosis not present

## 2023-12-04 DIAGNOSIS — Z825 Family history of asthma and other chronic lower respiratory diseases: Secondary | ICD-10-CM

## 2023-12-04 DIAGNOSIS — R569 Unspecified convulsions: Secondary | ICD-10-CM | POA: Diagnosis not present

## 2023-12-04 LAB — PROCALCITONIN: Procalcitonin: 4.37 ng/mL

## 2023-12-04 LAB — TROPONIN I (HIGH SENSITIVITY)
Troponin I (High Sensitivity): 91 ng/L — ABNORMAL HIGH (ref <18)
Troponin I (High Sensitivity): 74 ng/L — ABNORMAL HIGH (ref <18)

## 2023-12-04 LAB — URINE DRUG SCREEN, QUALITATIVE (ARMC ONLY)
Amphetamines, Ur Screen: NOT DETECTED
Barbiturates, Ur Screen: NOT DETECTED
Benzodiazepine, Ur Scrn: POSITIVE — AB
Cannabinoid 50 Ng, Ur ~~LOC~~: NOT DETECTED
Cocaine Metabolite,Ur ~~LOC~~: NOT DETECTED
MDMA (Ecstasy)Ur Screen: NOT DETECTED
Methadone Scn, Ur: NOT DETECTED
Opiate, Ur Screen: POSITIVE — AB
Phencyclidine (PCP) Ur S: NOT DETECTED
Tricyclic, Ur Screen: POSITIVE — AB

## 2023-12-04 LAB — BLOOD GAS, ARTERIAL
Acid-base deficit: 9.5 mmol/L — ABNORMAL HIGH (ref 0.0–2.0)
Acid-base deficit: 8.9 mmol/L — ABNORMAL HIGH (ref 0.0–2.0)
Bicarbonate: 18 mmol/L — ABNORMAL LOW (ref 20.0–28.0)
Bicarbonate: 18.4 mmol/L — ABNORMAL LOW (ref 20.0–28.0)
FIO2: 60 %
FIO2: 60 %
MECHVT: 450 mL
MECHVT: 450 mL
Mechanical Rate: 18
Mechanical Rate: 18
O2 Saturation: 96.6 %
O2 Saturation: 99.9 %
PEEP: 5 cmH2O
PEEP: 5 cmH2O
Patient temperature: 37
Patient temperature: 37
pCO2 arterial: 44 mm[Hg] (ref 32–48)
pCO2 arterial: 44 mm[Hg] (ref 32–48)
pH, Arterial: 7.22 — ABNORMAL LOW (ref 7.35–7.45)
pH, Arterial: 7.23 — ABNORMAL LOW (ref 7.35–7.45)
pO2, Arterial: 72 mm[Hg] — ABNORMAL LOW (ref 83–108)
pO2, Arterial: 109 mm[Hg] — ABNORMAL HIGH (ref 83–108)

## 2023-12-04 LAB — CBC WITH DIFFERENTIAL/PLATELET
Abs Immature Granulocytes: 0.09 10*3/uL — ABNORMAL HIGH (ref 0.00–0.07)
Basophils Absolute: 0.1 10*3/uL (ref 0.0–0.1)
Basophils Relative: 0 %
Eosinophils Absolute: 0.1 10*3/uL (ref 0.0–0.5)
Eosinophils Relative: 1 %
HCT: 47.9 % — ABNORMAL HIGH (ref 36.0–46.0)
Hemoglobin: 14.4 g/dL (ref 12.0–15.0)
Immature Granulocytes: 1 %
Lymphocytes Relative: 6 %
Lymphs Abs: 1.2 10*3/uL (ref 0.7–4.0)
MCH: 31.4 pg (ref 26.0–34.0)
MCHC: 30.1 g/dL (ref 30.0–36.0)
MCV: 104.6 fL — ABNORMAL HIGH (ref 80.0–100.0)
Monocytes Absolute: 1.3 10*3/uL — ABNORMAL HIGH (ref 0.1–1.0)
Monocytes Relative: 7 %
Neutro Abs: 16.3 10*3/uL — ABNORMAL HIGH (ref 1.7–7.7)
Neutrophils Relative %: 85 %
Platelets: 209 10*3/uL (ref 150–400)
RBC: 4.58 MIL/uL (ref 3.87–5.11)
RDW: 14 % (ref 11.5–15.5)
WBC: 19 10*3/uL — ABNORMAL HIGH (ref 4.0–10.5)
nRBC: 0 % (ref 0.0–0.2)

## 2023-12-04 LAB — CK: Total CK: 4155 U/L — ABNORMAL HIGH (ref 38–234)

## 2023-12-04 LAB — URINALYSIS, ROUTINE W REFLEX MICROSCOPIC
Bacteria, UA: NONE SEEN
Bilirubin Urine: NEGATIVE
Glucose, UA: NEGATIVE mg/dL
Ketones, ur: NEGATIVE mg/dL
Leukocytes,Ua: NEGATIVE
Nitrite: NEGATIVE
Protein, ur: 100 mg/dL — AB
RBC / HPF: 0 RBC/hpf (ref 0–5)
Specific Gravity, Urine: 1.019 (ref 1.005–1.030)
pH: 5 (ref 5.0–8.0)

## 2023-12-04 LAB — COMPREHENSIVE METABOLIC PANEL
ALT: 85 U/L — ABNORMAL HIGH (ref 0–44)
ALT: 61 U/L — ABNORMAL HIGH (ref 0–44)
AST: 162 U/L — ABNORMAL HIGH (ref 15–41)
AST: 118 U/L — ABNORMAL HIGH (ref 15–41)
Albumin: 4.2 g/dL (ref 3.5–5.0)
Albumin: 2.9 g/dL — ABNORMAL LOW (ref 3.5–5.0)
Alkaline Phosphatase: 85 U/L (ref 38–126)
Alkaline Phosphatase: 62 U/L (ref 38–126)
Anion gap: 22 — ABNORMAL HIGH (ref 5–15)
Anion gap: 12 (ref 5–15)
BUN: 62 mg/dL — ABNORMAL HIGH (ref 8–23)
BUN: 52 mg/dL — ABNORMAL HIGH (ref 8–23)
CO2: 16 mmol/L — ABNORMAL LOW (ref 22–32)
CO2: 16 mmol/L — ABNORMAL LOW (ref 22–32)
Calcium: 6.8 mg/dL — ABNORMAL LOW (ref 8.9–10.3)
Calcium: 6.4 mg/dL — CL (ref 8.9–10.3)
Chloride: 101 mmol/L (ref 98–111)
Chloride: 111 mmol/L (ref 98–111)
Creatinine, Ser: 3.72 mg/dL — ABNORMAL HIGH (ref 0.44–1.00)
Creatinine, Ser: 2.54 mg/dL — ABNORMAL HIGH (ref 0.44–1.00)
GFR, Estimated: 13 mL/min — ABNORMAL LOW (ref >60)
GFR, Estimated: 21 mL/min — ABNORMAL LOW (ref >60)
Glucose, Bld: 102 mg/dL — ABNORMAL HIGH (ref 70–99)
Glucose, Bld: 128 mg/dL — ABNORMAL HIGH (ref 70–99)
Potassium: 6 mmol/L — ABNORMAL HIGH (ref 3.5–5.1)
Potassium: 4.8 mmol/L (ref 3.5–5.1)
Sodium: 139 mmol/L (ref 135–145)
Sodium: 139 mmol/L (ref 135–145)
Total Bilirubin: 1 mg/dL (ref 0.0–1.2)
Total Bilirubin: 0.5 mg/dL (ref 0.0–1.2)
Total Protein: 7.7 g/dL (ref 6.5–8.1)
Total Protein: 5.5 g/dL — ABNORMAL LOW (ref 6.5–8.1)

## 2023-12-04 LAB — LACTIC ACID, PLASMA: Lactic Acid, Venous: 1.6 mmol/L (ref 0.5–1.9)

## 2023-12-04 LAB — ETHANOL: Alcohol, Ethyl (B): <10 mg/dL (ref <10)

## 2023-12-04 LAB — AMMONIA: Ammonia: 18 umol/L (ref 9–35)

## 2023-12-04 LAB — CBG MONITORING, ED: Glucose-Capillary: 115 mg/dL — ABNORMAL HIGH (ref 70–99)

## 2023-12-04 LAB — PROTIME-INR
INR: 1.1 (ref 0.8–1.2)
Prothrombin Time: 14.7 s (ref 11.4–15.2)

## 2023-12-04 LAB — SALICYLATE LEVEL: Salicylate Lvl: <7 mg/dL — ABNORMAL LOW (ref 7.0–30.0)

## 2023-12-04 LAB — MAGNESIUM: Magnesium: 2.3 mg/dL (ref 1.7–2.4)

## 2023-12-04 LAB — ACETAMINOPHEN LEVEL: Acetaminophen (Tylenol), Serum: <10 ug/mL — ABNORMAL LOW (ref 10–30)

## 2023-12-04 MED ORDER — LACTATED RINGERS IV BOLUS
1000.0000 mL | Freq: Once | INTRAVENOUS | Status: AC
  Administered 2023-12-04: 1000 mL via INTRAVENOUS

## 2023-12-04 MED ORDER — PANTOPRAZOLE SODIUM 40 MG IV SOLR
40.0000 mg | Freq: Every day | INTRAVENOUS | Status: DC
  Administered 2023-12-05: 40 mg via INTRAVENOUS
  Filled 2023-12-04: qty 10

## 2023-12-04 MED ORDER — ROCURONIUM BROMIDE 10 MG/ML (PF) SYRINGE: 80.0000 mg | PREFILLED_SYRINGE | Freq: Once | INTRAVENOUS | Status: AC

## 2023-12-04 MED ORDER — ALBUTEROL SULFATE (2.5 MG/3ML) 0.083% IN NEBU
10.0000 mg | INHALATION_SOLUTION | Freq: Once | RESPIRATORY_TRACT | Status: AC
  Administered 2023-12-04: 10 mg via RESPIRATORY_TRACT
  Filled 2023-12-04: qty 12

## 2023-12-04 MED ORDER — VANCOMYCIN HCL IN DEXTROSE 1-5 GM/200ML-% IV SOLN
1000.0000 mg | Freq: Once | INTRAVENOUS | Status: AC
  Administered 2023-12-04: 1000 mg via INTRAVENOUS
  Filled 2023-12-04: qty 200

## 2023-12-04 MED ORDER — INSULIN ASPART 100 UNIT/ML IV SOLN
10.0000 [IU] | Freq: Once | INTRAVENOUS | Status: AC
  Administered 2023-12-04: 10 [IU] via INTRAVENOUS
  Filled 2023-12-04: qty 0.1

## 2023-12-04 MED ORDER — ROCURONIUM BROMIDE 10 MG/ML (PF) SYRINGE
PREFILLED_SYRINGE | INTRAVENOUS | Status: AC
  Administered 2023-12-04: 80 mg via INTRAVENOUS
  Filled 2023-12-04: qty 10

## 2023-12-04 MED ORDER — MIDAZOLAM HCL 2 MG/2ML IJ SOLN
1.0000 mg | INTRAMUSCULAR | Status: DC | PRN
  Administered 2023-12-05 – 2023-12-06 (×6): 2 mg via INTRAVENOUS
  Filled 2023-12-04 (×6): qty 2

## 2023-12-04 MED ORDER — DEXTROSE 50 % IV SOLN
1.0000 | Freq: Once | INTRAVENOUS | Status: AC
  Administered 2023-12-04: 50 mL via INTRAVENOUS
  Filled 2023-12-04: qty 50

## 2023-12-04 MED ORDER — FENTANYL CITRATE PF 50 MCG/ML IJ SOSY
50.0000 ug | PREFILLED_SYRINGE | INTRAMUSCULAR | Status: AC | PRN
  Administered 2023-12-06 (×3): 50 ug via INTRAVENOUS
  Filled 2023-12-04 (×4): qty 1

## 2023-12-04 MED ORDER — CALCIUM GLUCONATE-NACL 2-0.675 GM/100ML-% IV SOLN
2.0000 g | Freq: Once | INTRAVENOUS | Status: AC
  Administered 2023-12-05: 2000 mg via INTRAVENOUS
  Filled 2023-12-04 (×2): qty 100

## 2023-12-04 MED ORDER — ETOMIDATE 2 MG/ML IV SOLN: 20.0000 mg | Freq: Once | INTRAVENOUS | Status: AC

## 2023-12-04 MED ORDER — FLUTICASONE PROPIONATE 50 MCG/ACT NA SUSP
1.0000 | Freq: Two times a day (BID) | NASAL | Status: DC | PRN
  Administered 2023-12-10 – 2024-01-01 (×2): 1 via NASAL
  Filled 2023-12-04: qty 16

## 2023-12-04 MED ORDER — THIAMINE MONONITRATE 100 MG PO TABS
100.0000 mg | ORAL_TABLET | Freq: Every day | ORAL | Status: DC
  Administered 2023-12-05: 100 mg
  Filled 2023-12-04: qty 1

## 2023-12-04 MED ORDER — DOCUSATE SODIUM 100 MG PO CAPS: 100.0000 mg | ORAL_CAPSULE | Freq: Two times a day (BID) | ORAL | Status: DC | PRN

## 2023-12-04 MED ORDER — NOREPINEPHRINE 4 MG/250ML-% IV SOLN
0.0000 ug/min | INTRAVENOUS | Status: DC
  Administered 2023-12-04: 2 ug/min via INTRAVENOUS
  Filled 2023-12-04: qty 250

## 2023-12-04 MED ORDER — MONTELUKAST SODIUM 10 MG PO TABS
10.0000 mg | ORAL_TABLET | Freq: Every day | ORAL | Status: DC
  Administered 2023-12-05 – 2023-12-07 (×3): 10 mg
  Filled 2023-12-04 (×3): qty 1

## 2023-12-04 MED ORDER — SODIUM CHLORIDE 0.9 % IV SOLN
2.0000 g | Freq: Once | INTRAVENOUS | Status: AC
  Administered 2023-12-04: 2 g via INTRAVENOUS
  Filled 2023-12-04: qty 10

## 2023-12-04 MED ORDER — CALCIUM GLUCONATE-NACL 2-0.675 GM/100ML-% IV SOLN
2.0000 g | Freq: Once | INTRAVENOUS | Status: AC
  Administered 2023-12-04: 2000 mg via INTRAVENOUS
  Filled 2023-12-04: qty 100

## 2023-12-04 MED ORDER — ETOMIDATE 2 MG/ML IV SOLN
INTRAVENOUS | Status: AC
  Administered 2023-12-04: 20 mg via INTRAVENOUS
  Filled 2023-12-04: qty 10

## 2023-12-04 MED ORDER — IPRATROPIUM-ALBUTEROL 0.5-2.5 (3) MG/3ML IN SOLN: 3.0000 mL | Freq: Four times a day (QID) | RESPIRATORY_TRACT | Status: DC | PRN

## 2023-12-04 MED ORDER — FOLIC ACID 1 MG PO TABS
1.0000 mg | ORAL_TABLET | Freq: Every day | ORAL | Status: DC
  Administered 2023-12-05 – 2023-12-08 (×4): 1 mg
  Filled 2023-12-04 (×4): qty 1

## 2023-12-04 MED ORDER — POLYETHYLENE GLYCOL 3350 17 G PO PACK: 17.0000 g | PACK | Freq: Every day | ORAL | Status: DC | PRN

## 2023-12-04 MED ORDER — SODIUM CHLORIDE 0.9 % IV BOLUS
1000.0000 mL | Freq: Once | INTRAVENOUS | Status: AC
  Administered 2023-12-04: 1000 mL via INTRAVENOUS

## 2023-12-04 MED ORDER — SODIUM CHLORIDE 0.9 % IV SOLN: INTRAVENOUS | Status: DC

## 2023-12-04 MED ORDER — METRONIDAZOLE 500 MG/100ML IV SOLN
500.0000 mg | Freq: Once | INTRAVENOUS | Status: AC
  Administered 2023-12-04: 500 mg via INTRAVENOUS
  Filled 2023-12-04: qty 100

## 2023-12-04 MED ORDER — DEXMEDETOMIDINE HCL IN NACL 400 MCG/100ML IV SOLN
0.0000 ug/kg/h | INTRAVENOUS | Status: DC
  Administered 2023-12-04: 0.4 ug/kg/h via INTRAVENOUS
  Filled 2023-12-04: qty 100

## 2023-12-04 MED ORDER — ATORVASTATIN CALCIUM 20 MG PO TABS: 40.0000 mg | ORAL_TABLET | Freq: Every day | ORAL | Status: DC

## 2023-12-04 MED ORDER — FENTANYL CITRATE PF 50 MCG/ML IJ SOSY: 50.0000 ug | PREFILLED_SYRINGE | INTRAMUSCULAR | Status: DC | PRN

## 2023-12-04 MED ORDER — FLUTICASONE FUROATE 200 MCG/ACT IN AEPB: 1.0000 | INHALATION_SPRAY | Freq: Every day | RESPIRATORY_TRACT | Status: DC

## 2023-12-04 NOTE — ED Notes (Signed)
Md in with pt and family

## 2023-12-04 NOTE — ED Triage Notes (Signed)
Patient to ED via [facility] from home- found unresponsive at [facility] after wellness check. Roommate reports pt sleeping for the past few days. Pt being bagged by EMS with agonal respirations. Hx of drug abuse. Given 2mg  IM Naracan and 2 mg IV of Narcan and Nacl.   150 cbg 101/40 104 HR

## 2023-12-04 NOTE — ED Notes (Signed)
Fsbs 115

## 2023-12-04 NOTE — H&P (Addendum)
 NAME:  Tami Hopkins, MRN:  [id number], DOB:  Sep 13, 1961, LOS: 0 ADMISSION DATE:  12/04/2023, CONSULTATION DATE:  12/04/2023 REFERRING MD:, CHIEF COMPLAINT:  Drug Overdose, Septic Shock   History of Present Illness:  Tami Hopkins is a 63 y.o. female with a PMH significant for HLD, COPD, Asthma, Tobacco use, Chronic pain syndrome, Anxiety, PTSD, IBS, and GERD who presents to the ED for altered mental status.  Per chart review, family had not heard from the patient for the past 2 days and called for a wellness check.  Patient subsequently found unresponsive sitting on the couch, roommate stated that they thought patient had been sleeping on the couch for the past few days.  Of note, patient was admitted to the hospital a couple of months ago for altered mental status due to suspected polypharmacy use.  Upon EMS arrival patient was given 2 mg of intranasal Narcan with no effect, subsequently given 2 mg IV Narcan with slight respiratory improvement, yet remained unresponsive.  Upon arrival to the ER, BP 94/78, afebrile, 100% on room air, she was noted to have vomit in her airway during suctioning. She was intubated for airway protection. Labs revealed K 6.0, Creat 3.72, BUN 62, AST 162, ALT 85, CK 4155, Troponin 91 and 74, lactic acid 1.6, WBC 19.0, Drug screen + benzodiazepine, opiate, tricyclics. She was given a total of 3 liters of IVF, started on broad spectrum antibiotics, cultures sent, and started on Norepinephrine infusion. CT c-spine without noted fracture, CT head without acute pathology, CT A/P/T bilateral atelectasis, stable non obstructing right renal calculi, hepatic steatosis, aortic atherosclerosis, no acute intra abdominal process.    PCCM was consulted for Medical Management in this critically ill patient in septic shock and respiratory failure suspected due to polysubstance abuse and aspiration Pneumonia  Pertinent  Medical History  HLD, Chronic Pain Syndrome, Asthma, Tobacco Use,  Anxiety, PTSD  Significant Hospital Events: Including procedures, antibiotic start and stop dates in addition to other pertinent events   2/12: Admitted with AMS due to polysubstance abuse. Septic shock, levophed, cultures sent, intubated  Interim History / Subjective:   Objective   Blood pressure 96/63, pulse 82, temperature 98.8 F (37.1 C), resp. rate 18, height 5\' 8"  (1.727 m), weight 80 kg, SpO2 99%.    Vent Mode: PRVC FiO2 (%):  [60 %] 60 % Set Rate:  [18 bmp] 18 bmp Vt Set:  [450 mL] 450 mL PEEP:  [5 cmH20] 5 cmH20  No intake or output data in the 24 hours ending 12/04/23 2139 Filed Weights   12/04/23 1304  Weight: 80 kg  Physical Exam Vitals reviewed.  Constitutional:      Appearance: She is normal weight.     Comments: NAD  HENT:     Head: Normocephalic and atraumatic.     Right Ear: External ear normal.     Left Ear: External ear normal.     Nose: Nose normal.     Mouth/Throat:     Mouth: Mucous membranes are dry.  Eyes:     Pupils: Pupils are equal, round, and reactive to light.  Cardiovascular:     Rate and Rhythm: Regular rhythm. Tachycardia present.     Pulses: Normal pulses.     Heart sounds: Normal heart sounds.  Pulmonary:     Effort: Pulmonary effort is normal.     Breath sounds: Normal breath sounds.  Abdominal:     General: Bowel sounds are normal.     Palpations:  Abdomen is soft.  Genitourinary:    Comments: Foley in place Musculoskeletal:        General: No swelling. Normal range of motion.     Cervical back: Normal range of motion.  Skin:    General: Skin is warm and dry.     Capillary Refill: Capillary refill takes less than 2 seconds.     Coloration: Skin is pale.     Comments: Abrassion/skin tear circumference abdomen  Dry psoriasis appear lesion under bilateral breast   Neurological:     Comments: Off sedation for 10 minutes No eye opening, not following commands, no response to pain +cough/gag, + right corneal  Occasional stim  induced twitching    Resolved Hospital Problem list   None  Assessment & Plan:   Polysubstance Abuse with Suspected Overdose Multifactorial Encephalopathy due to polysubstance abuse, metabolic derangements, sedation Hx: Anxiety/PTSD Patient with history of self medicating beyond dose that is prescribed (last admission 09/2023). Unclear if intentional or accidental. Will require MSW consult once neurological status improves -Home Regimen: Baclofen 10 mg BID, Buspar 10 mg QID, Klonopin 1 mg BID, Neurontin 300 mg QID, Seroquel 100 mg in AM and 3 PM, 200 mg nightly, Percocet 1 tab Q 6 hours PRN - Currently holding, resume as appropriate to avoid withdrawal -Consult to MSW  -Drug screen: Opiates, benzodiazepine, and tricyclics -Ammonia level normal -Monitor for withdrawal  Suspected Aspiration Pneumonia Septic Shock  -s/p 3 liters IVF -Antibiotics: Aztreonam, Metronidazole, and Vancomycin  -Cultures pending: BC x 2, Sputum, Procalcitonin 4.37. UA negative  Acute Respiratory Failure due to Inability to Protect Airway COPD; not in exacerbation Hx: Asthma, Tobacco Use Intubated in the ER 12/12 -Ventilator settings: PRVC  60% FiO2, 5 PEEP, continue ventilator support & lung protective strategies -Wean PEEP & FiO2 as tolerated, maintain SpO2 > 90% -Head of bed elevated 30 degrees, VAP protocol in place -Plateau pressures less than 30 cm H20  -Daily WUA with SBT as tolerated  -Repeat ABG pending and daily -Continue home Ellipta, Flonase, Singulair -Sedation: Precedex infusion, titrate as able  Rhabdomyolysis Acute Kidney Injury due to Rhabdomyolysis and dehydration Hyperkalemia -CK 4155 on admission, repeat pending, trend daily until clears -s/p 3 liters IVF in ER, continue normal saline @ 125 ml/hr -s/p Insulin 10 units IV, Dextrose, Albuterol - Repeat K 4.8 -Daily BMP, Mag, Phos -Avoid nephrotoxic agents -Pending trend of labs may require Nephrology consultation  Chronic Pain  Syndrome on Chronic Opiates Patient with chronic right hip, back, and sciatica pain. Follows with Pain clinic for injections, long term use of opiates. Pain yet remains uncontrolled per chart review  Hyperlipidemia -Continue home Lipitor  Best Practice (right click and "Reselect all SmartList Selections" daily)   Diet/type: NPO DVT prophylaxis prophylactic heparin , SCDs Pressure ulcer(s): N/A GI prophylaxis: H2B Lines: N/A Foley:  Yes, and it is still needed Code Status:  full code Last date of multidisciplinary goals of care discussion [Family not present]  Labs   CBC: Recent Labs  Lab 12/04/23 1312  WBC 19.0*  NEUTROABS 16.3*  HGB 14.4  HCT 47.9*  MCV 104.6*  PLT 209   Basic Metabolic Panel: Recent Labs  Lab 12/04/23 1312 12/04/23 1343  NA 139  --   K 6.0*  --   CL 101  --   CO2 16*  --   GLUCOSE 102*  --   BUN 62*  --   CREATININE 3.72*  --   CALCIUM 6.8*  --   MG  --  2.3   GFR: Estimated Creatinine Clearance: 17 mL/min (A) (by C-G formula based on SCr of 3.72 mg/dL (H)). Recent Labs  Lab 12/04/23 1312  WBC 19.0*  LATICACIDVEN 1.6   Liver Function Tests: Recent Labs  Lab 12/04/23 1312  AST 162*  ALT 85*  ALKPHOS 85  BILITOT 1.0  PROT 7.7  ALBUMIN 4.2   ABG    Component Value Date/Time   PHART 7.22 (L) 12/04/2023 1422   PCO2ART 44 12/04/2023 1422   PO2ART 72 (L) 12/04/2023 1422   HCO3 18.0 (L) 12/04/2023 1422   ACIDBASEDEF 9.5 (H) 12/04/2023 1422   O2SAT 96.6 12/04/2023 1422    Coagulation Profile: No results for input(s): "INR", "PROTIME" in the last 168 hours.  Cardiac Enzymes: Recent Labs  Lab 12/04/23 1615  CKTOTAL 4,155*   CBG: Recent Labs  Lab 12/04/23 1552  GLUCAP 115*   Review of Systems:   Unable to obtain due to intubated/comatose  Past Medical History:  She,  has a past medical history of Anxiety, Asthma, Bursitis of hip bilateral, Hyperlipidemia, PTSD (post-traumatic stress disorder), and Scoliosis.    Surgical History:   Past Surgical History:  Procedure Laterality Date   ABDOMINAL HYSTERECTOMY     CESAREAN SECTION     CHOLECYSTECTOMY     ENDOMETRIAL ABLATION     x3   NOSE SURGERY       Social History:   reports that she has been smoking cigarettes. She started smoking about 28 years ago. She has a 7.1 pack-year smoking history. She has never used smokeless tobacco. She reports that she does not drink alcohol and does not use drugs.   Family History:  Her family history includes Alcohol abuse in her father; Arthritis in her mother; Asthma in her mother; Breast cancer in her maternal aunt; Breast cancer (age of onset: 85) in her sister; Depression in her mother; Diabetes in her sister; Endometriosis in her daughter and daughter; Heart attack in her father; Hypertension in her sister; Obesity in her sister. There is no history of Heart failure.   Allergies Allergies  Allergen Reactions   Penicillins Anaphylaxis    Any "cillins"   Sulfa Antibiotics Anaphylaxis   Amitriptyline     Can not take with other medications  Can not take with other medications  Can not take with other medications    Bupropion     Can not take with other combination medications  Can not take with other combination medications  Can not take with other combination medications    Doxepin     Can not take with other medications  Can not take with other medications  Can not take with other medications    Moxifloxacin Other (See Comments)    Home Medications  Prior to Admission medications   Medication Sig Start Date End Date Taking? Authorizing Provider  albuterol (VENTOLIN HFA) 108 (90 Base) MCG/ACT inhaler Inhale into the lungs. 09/09/17  Yes [provider]  ARNUITY ELLIPTA 200 MCG/ACT AEPB Take 1 puff by mouth daily.   Yes [provider]  atorvastatin (LIPITOR) 40 MG tablet Take 40 mg by mouth daily.   Yes [provider]  baclofen (LIORESAL) 10 MG tablet Take 10 mg by  mouth 2 (two) times daily.   Yes [provider]  busPIRone (BUSPAR) 10 MG tablet Take 10 mg by mouth 4 (four) times daily. 10/03/23  Yes [provider]  clonazePAM (KLONOPIN) 1 MG tablet Take 1 mg by mouth 2 (two)  times daily as needed.   Yes [provider]  diphenoxylate-atropine (LOMOTIL) 2.5-0.025 MG per tablet Take 1 tablet by mouth 4 (four) times daily as needed for diarrhea or loose stools.   Yes [provider]  fluticasone (FLONASE) 50 MCG/ACT nasal spray Place 1 spray into both nostrils 2 (two) times daily.   Yes [provider]  folic acid (FOLVITE) 1 MG tablet Take 1 tablet (1 mg total) by mouth daily. 10/15/23  Yes Loyce Dys, MD  furosemide (LASIX) 40 MG tablet Take 1 tablet by mouth as needed.    Yes [provider]  gabapentin (NEURONTIN) 300 MG capsule Take 1 capsule (300 mg total) by mouth 4 (four) times daily. TAKE 1 CAPSULE BY MOUTH 4 TIMES DAILY. 11/25/23 03/24/24 Yes Yevette Edwards, MD  ipratropium (ATROVENT) 0.06 % nasal spray Place into the nose. 02/20/18 12/04/23 Yes [provider]  KLOR-CON 10 10 MEQ tablet Take 10 mEq by mouth as needed.  07/02/15  Yes [provider]  montelukast (SINGULAIR) 10 MG tablet Take 10 mg by mouth at bedtime.   Yes [provider]  oxyCODONE-acetaminophen (PERCOCET) 5-325 MG tablet Take 1 tablet by mouth every 6 (six) hours as needed for severe pain (pain score 7-10) (rib fx pain). 11/05/23 12/05/23 Yes Yevette Edwards, MD  oxyCODONE-acetaminophen (PERCOCET) 7.5-325 MG tablet Take 1 tablet by mouth every 6 (six) hours as needed for moderate pain (pain score 4-6) or severe pain (pain score 7-10). 11/11/23 12/11/23 Yes Yevette Edwards, MD  pantoprazole (PROTONIX) 40 MG tablet Take 40 mg by mouth daily.   Yes [provider]  prazosin (MINIPRESS) 1 MG capsule Take by mouth.   Yes [provider]  promethazine (PHENERGAN) 25 MG tablet Take 25 mg by mouth every  6 (six) hours as needed.   Yes [provider]  QUEtiapine (SEROQUEL) 100 MG tablet Take 100-200 mg by mouth 3 (three) times daily. Take 100 mg daily at 8 am Take 100 mg daily at 3 pm  Take 200 mg daily at bedtime 04/13/22  Yes [provider]  thiamine (VITAMIN B-1) 100 MG tablet Take 1 tablet (100 mg total) by mouth daily. 10/15/23  Yes Loyce Dys, MD  mupirocin ointment (BACTROBAN) 2 % APPLY 1 APPLICATION TOPICALLY DAILY. APPLY TO WOUND AT RIGHT EAR AND COVER WITH BANDAID UNTIL HEALED Patient not taking: Reported on 12/04/2023 01/24/23   Neale Burly, [place], MD  oxyCODONE-acetaminophen (PERCOCET) 5-325 MG tablet Take 1 tablet by mouth every 6 (six) hours as needed for moderate pain (pain score 4-6) or severe pain (pain score 7-10). 12/05/23 01/04/24  Yevette Edwards, MD     Critical care time: 105 minutes

## 2023-12-04 NOTE — ED Notes (Signed)
Bridgette russell np called about calcium lab result by this rn.

## 2023-12-04 NOTE — ED Notes (Signed)
Resumed care from lindsay rn.  Pt intubated.  Meds infusing.  Nsr on monitor.  Foley cath draining.  Ogt in place.

## 2023-12-04 NOTE — ED Notes (Signed)
Patient's sister at bedside. Awaiting CMP before going to CT.

## 2023-12-04 NOTE — ED Notes (Addendum)
Pt intubated, nsr on monitor.  Iv meds infusing.  Approx 1750 in foley bag.  Approx 200cc dark emesis in ogtube suction container.  Warm blankets in place.

## 2023-12-04 NOTE — ED Provider Notes (Signed)
[facility] Provider Note    Event Date/Time   First MD Initiated Contact with Patient 12/04/23 1314     (approximate)   History   Chief Complaint Unresponsive   HPI  Tami Hopkins is a 63 y.o. female with past medical history of hyperlipidemia, chronic pain syndrome, PTSD, and GERD who presents to the ED for altered mental status.  Per EMS, family had not heard from the patient for the past 2 days and called for a wellness check.  Patient subsequently found unresponsive sitting on the couch, roommate stated to PD that they thought patient had been sleeping on the couch for the past few days.  Notably, patient admitted to the hospital a couple of months ago for altered mental status due to suspected polypharmacy.  Patient was given 2 mg of intranasal Narcan with no effect, subsequently given 2 mg IV Narcan by EMS.  EMS states that she seemed to be breathing slightly better following IV Narcan, but remained unresponsive.  EMS noted that patient had been incontinent of urine and stool, also noted to have some vomit in her airway with suctioning.     Physical Exam   Triage Vital Signs: ED Triage Vitals  Encounter Vitals Group     BP 12/04/23 1305 (!) 115/91     Systolic BP Percentile --      Diastolic BP Percentile --      Pulse Rate 12/04/23 1305 100     Resp 12/04/23 1305 13     Temp --      Temp src --      SpO2 12/04/23 1305 100 %     Weight 12/04/23 1304 176 lb 5.9 oz (80 kg)     Height 12/04/23 1304 5\' 8"  (1.727 m)     Head Circumference --      Peak Flow --      Pain Score 12/04/23 1303 0     Pain Loc --      Pain Education --      Exclude from Growth Chart --     Most recent vital signs: Vitals:   12/04/23 1506 12/04/23 1509  BP: 99/69 100/68  Pulse: 87 88  Resp: 18 18  Temp: (!) 97.5 F (36.4 C) (!) 97.5 F (36.4 C)  SpO2: 95% 95%    Constitutional: Unresponsive to painful stimuli. Eyes: Conjunctivae are normal.  Pupils 4 mm  and minimally reactive to light. Head: Atraumatic. Nose: Dried blood noted to nasal passageways. Mouth/Throat: Mucous membranes are moist.  Emesis in posterior oropharynx. Cardiovascular: Tachycardic, regular rhythm. Grossly normal heart sounds.  2+ radial pulses bilaterally. Respiratory: Decreased respiratory effort respiratory effort.  No retractions. Lungs with crackles throughout. Gastrointestinal: Soft and nondistended. Musculoskeletal: No lower extremity edema. Neurologic: No verbal response, does not withdraw from painful stimuli.    ED Results / Procedures / Treatments   Labs (all labs ordered are listed, but only abnormal results are displayed) Labs Reviewed  CBC WITH DIFFERENTIAL/PLATELET - Abnormal; Notable for the following components:      Result Value   WBC 19.0 (*)    HCT 47.9 (*)    MCV 104.6 (*)    Neutro Abs 16.3 (*)    Monocytes Absolute 1.3 (*)    Abs Immature Granulocytes 0.09 (*)    All other components within normal limits  COMPREHENSIVE METABOLIC PANEL - Abnormal; Notable for the following components:   Potassium 6.0 (*)    CO2 16 (*)  Glucose, Bld 102 (*)    BUN 62 (*)    Creatinine, Ser 3.72 (*)    Calcium 6.8 (*)    AST 162 (*)    ALT 85 (*)    GFR, Estimated 13 (*)    Anion gap 22 (*)    All other components within normal limits  ACETAMINOPHEN LEVEL - Abnormal; Notable for the following components:   Acetaminophen (Tylenol), Serum <10 (*)    All other components within normal limits  SALICYLATE LEVEL - Abnormal; Notable for the following components:   Salicylate Lvl <7.0 (*)    All other components within normal limits  URINALYSIS, ROUTINE W REFLEX MICROSCOPIC - Abnormal; Notable for the following components:   Color, Urine AMBER (*)    APPearance CLOUDY (*)    Hgb urine dipstick LARGE (*)    Protein, ur 100 (*)    All other components within normal limits  URINE DRUG SCREEN, QUALITATIVE ([facility] ONLY) - Abnormal; Notable for the following  components:   Tricyclic, Ur Screen POSITIVE (*)    Opiate, Ur Screen POSITIVE (*)    Benzodiazepine, Ur Scrn POSITIVE (*)    All other components within normal limits  BLOOD GAS, ARTERIAL - Abnormal; Notable for the following components:   pH, Arterial 7.22 (*)    pO2, Arterial 72 (*)    Bicarbonate 18.0 (*)    Acid-base deficit 9.5 (*)    All other components within normal limits  TROPONIN I (HIGH SENSITIVITY) - Abnormal; Notable for the following components:   Troponin I (High Sensitivity) 91 (*)    All other components within normal limits  CULTURE, BLOOD (ROUTINE X 2)  CULTURE, BLOOD (ROUTINE X 2)  ETHANOL  LACTIC ACID, PLASMA  MAGNESIUM  LACTIC ACID, PLASMA  CK  TROPONIN I (HIGH SENSITIVITY)     EKG  ED ECG REPORT I, Chesley Noon, the attending physician, personally viewed and interpreted this ECG.   Date: 12/04/2023  EKG Time: 13:15  Rate: 103  Rhythm: sinus tachycardia  Axis: Normal  Intervals: Prolonged QT  ST&T Change: None  RADIOLOGY Chest x-ray reviewed and interpreted by me with multifocal infiltrates concerning for aspiration, ET tube appropriately positioned.  PROCEDURES:  Critical Care performed: Yes, see critical care procedure note(s)  Procedure Name: Intubation Date/Time: 12/04/2023 1:22 PM  Performed by: Chesley Noon, MDPre-anesthesia Checklist: Patient identified, Patient being monitored, Emergency Drugs available, Timeout performed and Suction available Oxygen Delivery Method: Non-rebreather mask Preoxygenation: Pre-oxygenation with 100% oxygen Induction Type: Rapid sequence Ventilation: Mask ventilation without difficulty Laryngoscope Size: Glidescope and 4 Grade View: Grade I Tube size: 7.5 mm Number of attempts: 1 Airway Equipment and Method: Video-laryngoscopy Placement Confirmation: ETT inserted through vocal cords under direct vision, CO2 detector and Breath sounds checked- equal and bilateral Secured at: 24 cm Tube secured  with: ETT holder Dental Injury: Teeth and Oropharynx as per pre-operative assessment     .Critical Care  Performed by: Chesley Noon, MD Authorized by: Chesley Noon, MD   Critical care provider statement:    Critical care time (minutes):  30   Critical care time was exclusive of:  Separately billable procedures and treating other patients and teaching time   Critical care was necessary to treat or prevent imminent or life-threatening deterioration of the following conditions:  Respiratory failure and CNS failure or compromise   Critical care was time spent personally by me on the following activities:  Development of treatment plan with patient or surrogate, discussions with consultants, evaluation  of patient's response to treatment, examination of patient, ordering and review of laboratory studies, ordering and review of radiographic studies, ordering and performing treatments and interventions, pulse oximetry, re-evaluation of patient's condition and review of old charts   I assumed direction of critical care for this patient from another provider in my specialty: no     Care discussed with: admitting provider      MEDICATIONS ORDERED IN ED: Medications  dexmedetomidine (PRECEDEX) 400 MCG/100ML (4 mcg/mL) infusion (0.4 mcg/kg/hr  80 kg Intravenous New Bag/Given 12/04/23 1412)  fentaNYL (SUBLIMAZE) injection 50 mcg (has no administration in time range)  fentaNYL (SUBLIMAZE) injection 50-200 mcg (has no administration in time range)  midazolam (VERSED) injection 1-2 mg (has no administration in time range)  aztreonam (AZACTAM) 2 g in sodium chloride 0.9 % 100 mL IVPB (2 g Intravenous New Bag/Given 12/04/23 1520)  metroNIDAZOLE (FLAGYL) IVPB 500 mg (has no administration in time range)  vancomycin (VANCOCIN) IVPB 1000 mg/200 mL premix (has no administration in time range)  albuterol (PROVENTIL) (2.5 MG/3ML) 0.083% nebulizer solution 10 mg (has no administration in time range)  insulin  aspart (novoLOG) injection 10 Units (has no administration in time range)    And  dextrose 50 % solution 50 mL (has no administration in time range)  etomidate (AMIDATE) injection 20 mg (20 mg Intravenous Given 12/04/23 1309)  rocuronium (ZEMURON) injection 80 mg (80 mg Intravenous Given 12/04/23 1310)  lactated ringers bolus 1,000 mL (0 mLs Intravenous Stopped 12/04/23 1522)  sodium chloride 0.9 % bolus 1,000 mL (1,000 mLs Intravenous New Bag/Given 12/04/23 1524)     IMPRESSION / MDM / ASSESSMENT AND PLAN / ED COURSE  I reviewed the triage vital signs and the nursing notes.                              63 y.o. female with past medical history of hyperlipidemia, PTSD, chronic pain syndrome, and GERD who presents to the ED for altered mental status after being found unresponsive on her couch.  Patient's presentation is most consistent with acute presentation with potential threat to life or bodily function.  Differential diagnosis includes, but is not limited to, intracranial hemorrhage, stroke, sepsis, aspiration, electrolyte abnormality, anemia, AKI, UTI, overdose, substance abuse.  Patient ill-appearing on arrival, unresponsive to painful stimuli with poor respiratory effort.  Significant emesis noted in her posterior oropharynx by EMS, required additional suctioning prior to intubation.  Patient intubated without difficulty, chest x-ray to confirm positioning is pending at this time.  We will check CT head, neck, and chest/abdomen/pelvis given unclear history.  Lab results are pending at this time.  Overall, I am concerned that patient likely aspirated with prolonged period of poor oxygenation.  Labs with significant leukocytosis and patient meets sepsis criteria, will start on broad-spectrum antibiotics, especially given concern for aspiration.  Initial chest x-ray showed ET tube appeared to be deep, was retracted 2 cm with follow-up x-ray showing appropriate positioning.  Labs additionally  with new renal failure and hyperkalemia with potassium of 6.0.  We will continue IV fluid hydration, treat with insulin and high dose albuterol, no EKG changes to necessitate calcium.  Patient with mild transaminitis but no elevation in bilirubin to suggest biliary obstruction.  Lactic acid within normal limits.  Patient turned over to oncoming provider pending CT results and admission.      FINAL CLINICAL IMPRESSION(S) / ED DIAGNOSES   Final diagnoses:  Unresponsive state  Aspiration into airway, initial encounter     Rx / DC Orders   ED Discharge Orders     None        Note:  This document was prepared using Dragon voice recognition software and may include unintentional dictation errors.   Chesley Noon, MD 12/04/23 [phone]

## 2023-12-04 NOTE — ED Notes (Signed)
Levophed started and another iv placed.  Family with pt.   Nsr on monitor.  Dark colored bile in ogtube.

## 2023-12-04 NOTE — ED Notes (Signed)
Family in room with pt

## 2023-12-04 NOTE — Consult Note (Signed)
CODE SEPSIS - PHARMACY COMMUNICATION  **Broad Spectrum Antibiotics should be administered within 1 hour of Sepsis diagnosis**  Time Code Sepsis Called/Page Received: 1512  Antibiotics Ordered: Aztreonam, metronidazole, and vancomycin  Time of 1st antibiotic administration: 1520  Additional action taken by pharmacy: none  If necessary, Name of Provider/Nurse Contacted: n/a    Conley Delisle Rodriguez-Guzman PharmD, BCPS 12/04/2023 3:17 PM

## 2023-12-04 NOTE — ED Notes (Signed)
Patient's pants noted to be full of feces. Patient cleaned at this time. Patient placed in new gown.

## 2023-12-04 NOTE — Consult Note (Signed)
PHARMACY CONSULT NOTE - ELECTROLYTES  Pharmacy Consult for Electrolyte Monitoring and Replacement   Recent Labs: Height: 5\' 8"  (172.7 cm) Weight: 80 kg (176 lb 5.9 oz) IBW/kg (Calculated) : 63.9 Estimated Creatinine Clearance: 17 mL/min (A) (by C-G formula based on SCr of 3.72 mg/dL (H)). Potassium (mmol/L)  Date Value  12/04/2023 6.0 (H)   Magnesium (mg/dL)  Date Value  81/19/1478 2.3   Calcium (mg/dL)  Date Value  29/56/2130 6.8 (L)   Albumin (g/dL)  Date Value  86/57/8469 4.2   Phosphorus (mg/dL)  Date Value  62/95/2841 2.9   Sodium (mmol/L)  Date Value  12/04/2023 139    Assessment  Tami Hopkins is a 63 y.o. female presenting with acute hypoxic respiratory failure. PMH significant for HLD, chronic pain, PTSD, and GERD. Pharmacy has been consulted to monitor and replace electrolytes.   MIVF: N/A Pertinent medications: N/A  Goal of Therapy: Electrolytes WNL  Plan:  Ca = 6.8, give calcium gluconate 2 grams IV x 1 Check BMP, Mg, Phos with AM labs  Thank you for allowing pharmacy to be a part of this patient's care.  Barrie Folk, PharmD Clinical Pharmacist 12/04/2023 4:09 PM

## 2023-12-04 NOTE — Hospital Course (Addendum)
Tami Hopkins

## 2023-12-04 NOTE — ED Notes (Addendum)
No gag reflex, no cough when suctioned by icu md.   No beds in icu

## 2023-12-04 NOTE — ED Notes (Signed)
Pt to ct with rn and rt

## 2023-12-04 NOTE — ED Notes (Signed)
Pt continues to wait on icu bed assignment.  Pt is intubated   iv meds infusing.  Foley in place ogt in place.  Nsr on monitor.

## 2023-12-04 NOTE — Sepsis Progress Note (Signed)
eLink is following this Code Sepsis.

## 2023-12-05 ENCOUNTER — Inpatient Hospital Stay: Payer: Medicare HMO

## 2023-12-05 DIAGNOSIS — J9600 Acute respiratory failure, unspecified whether with hypoxia or hypercapnia: Secondary | ICD-10-CM | POA: Diagnosis present

## 2023-12-05 DIAGNOSIS — G928 Other toxic encephalopathy: Secondary | ICD-10-CM | POA: Diagnosis not present

## 2023-12-05 DIAGNOSIS — M6282 Rhabdomyolysis: Secondary | ICD-10-CM

## 2023-12-05 LAB — MAGNESIUM: Magnesium: 2 mg/dL (ref 1.7–2.4)

## 2023-12-05 LAB — CBC
HCT: 47.9 % — ABNORMAL HIGH (ref 36.0–46.0)
HCT: 40 % (ref 36.0–46.0)
Hemoglobin: 14.7 g/dL (ref 12.0–15.0)
Hemoglobin: 12.7 g/dL (ref 12.0–15.0)
MCH: 31.1 pg (ref 26.0–34.0)
MCH: 31.2 pg (ref 26.0–34.0)
MCHC: 30.7 g/dL (ref 30.0–36.0)
MCHC: 31.8 g/dL (ref 30.0–36.0)
MCV: 101.5 fL — ABNORMAL HIGH (ref 80.0–100.0)
MCV: 98.3 fL (ref 80.0–100.0)
Platelets: 177 10*3/uL (ref 150–400)
Platelets: 191 10*3/uL (ref 150–400)
RBC: 4.72 MIL/uL (ref 3.87–5.11)
RBC: 4.07 MIL/uL (ref 3.87–5.11)
RDW: 14.1 % (ref 11.5–15.5)
RDW: 14.5 % (ref 11.5–15.5)
WBC: 13 10*3/uL — ABNORMAL HIGH (ref 4.0–10.5)
WBC: 19.8 10*3/uL — ABNORMAL HIGH (ref 4.0–10.5)
nRBC: 0 % (ref 0.0–0.2)
nRBC: 0 % (ref 0.0–0.2)

## 2023-12-05 LAB — RESPIRATORY PANEL BY PCR

## 2023-12-05 LAB — BASIC METABOLIC PANEL
Anion gap: 11 (ref 5–15)
Anion gap: 12 (ref 5–15)
BUN: 48 mg/dL — ABNORMAL HIGH (ref 8–23)
BUN: 48 mg/dL — ABNORMAL HIGH (ref 8–23)
CO2: 19 mmol/L — ABNORMAL LOW (ref 22–32)
CO2: 20 mmol/L — ABNORMAL LOW (ref 22–32)
Calcium: 8 mg/dL — ABNORMAL LOW (ref 8.9–10.3)
Calcium: 8 mg/dL — ABNORMAL LOW (ref 8.9–10.3)
Chloride: 110 mmol/L (ref 98–111)
Chloride: 111 mmol/L (ref 98–111)
Creatinine, Ser: 2.07 mg/dL — ABNORMAL HIGH (ref 0.44–1.00)
Creatinine, Ser: 1.3 mg/dL — ABNORMAL HIGH (ref 0.44–1.00)
GFR, Estimated: 26 mL/min — ABNORMAL LOW (ref >60)
GFR, Estimated: 46 mL/min — ABNORMAL LOW (ref >60)
Glucose, Bld: 126 mg/dL — ABNORMAL HIGH (ref 70–99)
Glucose, Bld: 145 mg/dL — ABNORMAL HIGH (ref 70–99)
Potassium: 5.3 mmol/L — ABNORMAL HIGH (ref 3.5–5.1)
Potassium: 4.7 mmol/L (ref 3.5–5.1)
Sodium: 140 mmol/L (ref 135–145)
Sodium: 143 mmol/L (ref 135–145)

## 2023-12-05 LAB — GLUCOSE, CAPILLARY
Glucose-Capillary: 111 mg/dL — ABNORMAL HIGH (ref 70–99)
Glucose-Capillary: 116 mg/dL — ABNORMAL HIGH (ref 70–99)
Glucose-Capillary: 125 mg/dL — ABNORMAL HIGH (ref 70–99)
Glucose-Capillary: 139 mg/dL — ABNORMAL HIGH (ref 70–99)
Glucose-Capillary: 142 mg/dL — ABNORMAL HIGH (ref 70–99)

## 2023-12-05 LAB — MRSA NEXT GEN BY PCR, NASAL: MRSA by PCR Next Gen: NOT DETECTED

## 2023-12-05 LAB — HEMOGLOBIN AND HEMATOCRIT, BLOOD
HCT: 42.2 % (ref 36.0–46.0)
HCT: 42.2 % (ref 36.0–46.0)
Hemoglobin: 13.3 g/dL (ref 12.0–15.0)
Hemoglobin: 13.4 g/dL (ref 12.0–15.0)

## 2023-12-05 LAB — BLOOD GAS, ARTERIAL
Acid-base deficit: 8.2 mmol/L — ABNORMAL HIGH (ref 0.0–2.0)
Bicarbonate: 19.3 mmol/L — ABNORMAL LOW (ref 20.0–28.0)
FIO2: 50 %
MECHVT: 450 mL
Mechanical Rate: 20
O2 Saturation: 99.2 %
PEEP: 8 cmH2O
Patient temperature: 37
pCO2 arterial: 46 mm[Hg] (ref 32–48)
pH, Arterial: 7.23 — ABNORMAL LOW (ref 7.35–7.45)
pO2, Arterial: 97 mm[Hg] (ref 83–108)

## 2023-12-05 LAB — PHOSPHORUS
Phosphorus: 5.8 mg/dL — ABNORMAL HIGH (ref 2.5–4.6)
Phosphorus: 4.2 mg/dL (ref 2.5–4.6)

## 2023-12-05 LAB — CK
Total CK: 4054 U/L — ABNORMAL HIGH (ref 38–234)
Total CK: 2547 U/L — ABNORMAL HIGH (ref 38–234)

## 2023-12-05 LAB — TROPONIN I (HIGH SENSITIVITY): Troponin I (High Sensitivity): 38 ng/L — ABNORMAL HIGH (ref <18)

## 2023-12-05 MED ORDER — SODIUM ZIRCONIUM CYCLOSILICATE 5 G PO PACK
10.0000 g | PACK | Freq: Once | ORAL | Status: AC
  Administered 2023-12-05: 10 g
  Filled 2023-12-05: qty 2

## 2023-12-05 MED ORDER — CHLORHEXIDINE GLUCONATE CLOTH 2 % EX PADS
6.0000 | MEDICATED_PAD | Freq: Every day | CUTANEOUS | Status: DC
Start: 2023-12-05 — End: 2023-12-10
  Administered 2023-12-05 – 2023-12-09 (×5): 6 via TOPICAL

## 2023-12-05 MED ORDER — BACLOFEN 10 MG PO TABS
10.0000 mg | ORAL_TABLET | Freq: Two times a day (BID) | ORAL | Status: DC
  Administered 2023-12-05 – 2023-12-10 (×10): 10 mg via ORAL
  Filled 2023-12-05 (×11): qty 1

## 2023-12-05 MED ORDER — SODIUM CHLORIDE 0.9 % IV BOLUS
1000.0000 mL | Freq: Once | INTRAVENOUS | Status: AC
  Administered 2023-12-05: 1000 mL via INTRAVENOUS

## 2023-12-05 MED ORDER — CHLORHEXIDINE GLUCONATE CLOTH 2 % EX PADS: 6.0000 | MEDICATED_PAD | Freq: Every day | CUTANEOUS | Status: DC

## 2023-12-05 MED ORDER — PROPOFOL 1000 MG/100ML IV EMUL
5.0000 ug/kg/min | INTRAVENOUS | Status: DC
  Administered 2023-12-05: 5 ug/kg/min via INTRAVENOUS
  Administered 2023-12-06: 45 ug/kg/min via INTRAVENOUS
  Administered 2023-12-06: 35 ug/kg/min via INTRAVENOUS
  Administered 2023-12-06: 45 ug/kg/min via INTRAVENOUS
  Administered 2023-12-06: 55 ug/kg/min via INTRAVENOUS
  Administered 2023-12-07: 45 ug/kg/min via INTRAVENOUS
  Administered 2023-12-07: 5 ug/kg/min via INTRAVENOUS
  Administered 2023-12-08: 15 ug/kg/min via INTRAVENOUS
  Filled 2023-12-05 (×10): qty 100

## 2023-12-05 MED ORDER — SODIUM CHLORIDE 0.9 % IV SOLN
2.0000 g | INTRAVENOUS | Status: DC
  Administered 2023-12-05 – 2023-12-08 (×4): 2 g via INTRAVENOUS
  Filled 2023-12-05 (×5): qty 20

## 2023-12-05 MED ORDER — BUDESONIDE 0.5 MG/2ML IN SUSP
0.5000 mg | Freq: Two times a day (BID) | RESPIRATORY_TRACT | Status: DC
Start: 2023-12-05 — End: 2023-12-06
  Administered 2023-12-05 – 2023-12-06 (×3): 0.5 mg via RESPIRATORY_TRACT
  Filled 2023-12-05 (×3): qty 2

## 2023-12-05 MED ORDER — PANTOPRAZOLE SODIUM 40 MG IV SOLR
40.0000 mg | Freq: Two times a day (BID) | INTRAVENOUS | Status: DC
  Administered 2023-12-05 – 2023-12-07 (×4): 40 mg via INTRAVENOUS
  Filled 2023-12-05 (×4): qty 10

## 2023-12-05 MED ORDER — ORAL CARE MOUTH RINSE: 15.0000 mL | OROMUCOSAL | Status: DC | PRN

## 2023-12-05 MED ORDER — ORAL CARE MOUTH RINSE
15.0000 mL | OROMUCOSAL | Status: DC
  Administered 2023-12-05 – 2023-12-08 (×37): 15 mL via OROMUCOSAL

## 2023-12-05 MED ORDER — HEPARIN SODIUM (PORCINE) 5000 UNIT/ML IJ SOLN: 5000.0000 [IU] | Freq: Three times a day (TID) | INTRAMUSCULAR | Status: DC

## 2023-12-05 MED ORDER — PANTOPRAZOLE SODIUM 40 MG IV SOLR
40.0000 mg | Freq: Once | INTRAVENOUS | Status: AC
  Administered 2023-12-05: 40 mg via INTRAVENOUS
  Filled 2023-12-05: qty 10

## 2023-12-05 MED ORDER — GADOBUTROL 1 MMOL/ML IV SOLN
8.0000 mL | Freq: Once | INTRAVENOUS | Status: AC | PRN
  Administered 2023-12-05: 8 mL via INTRAVENOUS

## 2023-12-05 MED ORDER — SODIUM CHLORIDE 0.9 % IV BOLUS
500.0000 mL | Freq: Once | INTRAVENOUS | Status: AC
  Administered 2023-12-05: 500 mL via INTRAVENOUS

## 2023-12-05 MED ORDER — SODIUM CHLORIDE 0.9 % IV SOLN
2.0000 g | Freq: Two times a day (BID) | INTRAVENOUS | Status: DC
  Administered 2023-12-05: 2 g via INTRAVENOUS
  Filled 2023-12-05: qty 10

## 2023-12-05 MED ORDER — METRONIDAZOLE 500 MG/100ML IV SOLN
500.0000 mg | Freq: Two times a day (BID) | INTRAVENOUS | Status: DC
  Administered 2023-12-05: 500 mg via INTRAVENOUS
  Filled 2023-12-05 (×2): qty 100

## 2023-12-05 NOTE — Progress Notes (Signed)
NAME:  Tami Hopkins, MRN:  [id number], DOB:  14-Jul-1961, LOS: 1 ADMISSION DATE:  12/04/2023 History of Present Illness:  Tami Hopkins is a 63 y.o. female with a PMH significant for HLD, COPD, Asthma, Tobacco use, Chronic pain syndrome, Anxiety, PTSD, IBS, and GERD who presents to the ED for altered mental status.  Per chart review, family had not heard from the patient for the past 2 days and called for a wellness check.  Patient subsequently found unresponsive sitting on the couch, roommate stated that they thought patient had been sleeping on the couch for the past few days.  Of note, patient was admitted to the hospital a couple of months ago for altered mental status due to suspected polypharmacy use.  Upon EMS arrival patient was given 2 mg of intranasal Narcan with no effect, subsequently given 2 mg IV Narcan with slight respiratory improvement, yet remained unresponsive.  Upon arrival to the ER, BP 94/78, afebrile, 100% on room air, she was noted to have vomit in her airway during suctioning. She was intubated for airway protection. Labs revealed K 6.0, Creat 3.72, BUN 62, AST 162, ALT 85, CK 4155, Troponin 91 and 74, lactic acid 1.6, WBC 19.0, Drug screen + benzodiazepine, opiate, tricyclics. She was given a total of 3 liters of IVF, started on broad spectrum antibiotics, cultures sent, and started on Norepinephrine infusion. CT c-spine without noted fracture, CT head without acute pathology, CT A/P/T bilateral atelectasis, stable non obstructing right renal calculi, hepatic steatosis, aortic atherosclerosis, no acute intra abdominal process.     PCCM was consulted for Medical Management in this critically ill patient in septic shock and respiratory failure suspected due to polysubstance abuse and aspiration Pneumonia  Pertinent  Medical History  HLD, Chronic Pain Syndrome, Asthma, Tobacco Use, Anxiety, PTSD   Significant Hospital Events: Including procedures, antibiotic start and stop  dates in addition to other pertinent events   2/12: Admitted with AMS due to polysubstance abuse. Septic shock, levophed, cultures sent, intubated  02/13: Brainstem reflexes improved today with regain of cough, gag, pupils reactive.   Interim History / Subjective:  Improved brain stem reflexes this am. Remains nonresponsive to voice.   Objective   Blood pressure 123/88, pulse (!) 104, temperature 98.2 F (36.8 C), resp. rate 20, height 5\' 8"  (1.727 m), weight 81.9 kg, SpO2 100%.    Vent Mode: PRVC FiO2 (%):  [50 %-60 %] 50 % Set Rate:  [18 bmp-20 bmp] 20 bmp Vt Set:  [450 mL] 450 mL PEEP:  [5 cmH20-8 cmH20] 8 cmH20 Plateau Pressure:  [13 cmH20-21 cmH20] 13 cmH20   Intake/Output Summary (Last 24 hours) at 12/05/2023 1735 Last data filed at 12/05/2023 1500 Gross per 24 hour  Intake 1844.46 ml  Output 1510 ml  Net 334.46 ml   Filed Weights   12/04/23 1304 12/05/23 0028 12/05/23 0500  Weight: 80 kg 81.9 kg 81.9 kg    Examination: GEN patient intubated without sedation, gag and cough present. HEENT supple neck, reactive pupil, corneal reflexes present. CVS normal S1, normal S2, regular rate and rhythm Lungs clear bilateral air entry Abdomen soft nontender nondistended positive bowel sound Extremities warm well-perfused no edema  Labs and imaging were reviewed.   Assessment & Plan:  63 year old female patient with past medical history of chronic pain on chronic opioid use presenting with altered mental status and low GCS.  Intubated in the emergency department on 12/04/2023.  Remained with a low GCS.  Thought to be  secondary to oxycodone overdose.  # Toxic encephalopathy secondary to oxycodone overdose with U-Tox positive for opioids, benzos. # Suspected aspiration pneumonia # Rhabdo with CK 4000 # Suspecting GI bleed with dark output from her OG tube.  Resolved later during the day.  Neuro: Restart home baclofen 10 mg twice daily.  Hold BuSpar Klonopin Neurontin Seroquel and  Percocet.  We need to restart her on opioids at some point given high risk for withdrawal. CVS Target MAP greater than 65 GI keep n.p.o. for now.  If no signs of GI bleed in the a.m. restart tube feeds.  IV PPI twice daily. Renal normal saline at 150 cc/h.  CK in the morning.  Monitor urine output.  Avoid nephrotoxic agent. Heme: SCDs for now.  Chemical prophylaxis in the AM.  Best Practice (right click and "Reselect all SmartList Selections" daily)   Diet/type: NPO DVT prophylaxis SCD Pressure ulcer(s): N/A GI prophylaxis: PPI Lines: N/A Foley:  Yes, and it is still needed Code Status:  full code Last date of multidisciplinary goals of care discussion [12/05/2023]  I spent 60 minutes caring for this patient today, including preparing to see the patient, obtaining a medical history , reviewing a separately obtained history, performing a medically appropriate examination and/or evaluation, counseling and educating the patient/family/caregiver, ordering medications, tests, or procedures, documenting clinical information in the electronic health record, and independently interpreting results (not separately reported/billed) and communicating results to the patient/family/caregiver  Tami Colonel, MD [facility] 12/05/2023 5:57 PM

## 2023-12-05 NOTE — Progress Notes (Addendum)
Per Dietician, Tami Hopkins, OG tube needs to be advanced by approximately 5 cm. Tube advanced and stat x-ray ordered. After advancing tube, color of GI content changed from red/coffee ground to bile/green. NP Elvina Sidle made aware.

## 2023-12-05 NOTE — Progress Notes (Signed)
Transported to MRI and back with no events.

## 2023-12-05 NOTE — Plan of Care (Signed)
Problem: Clinical Measurements: Goal: Ability to maintain clinical measurements within normal limits will improve Outcome: Progressing Goal: Respiratory complications will improve Outcome: Progressing   Problem: Nutrition: Goal: Adequate nutrition will be maintained Outcome: Not Progressing

## 2023-12-05 NOTE — Progress Notes (Addendum)
Initial Nutrition Assessment  DOCUMENTATION CODES:   Not applicable  INTERVENTION:   If tube feeds initiated, recommend:  Vital 1.2@65ml /hr- Initiate at 47ml/hr and increase by 43ml/hr q 8 hours until goal rate are reached.   Free water flushes 30ml q4 hours to maintain tube patency   Regimen provides 1872kcal/day, 117g/day protein and 1453ml/day of free water.   Pt at high refeed risk; recommend monitor potassium, magnesium and phosphorus labs daily until stable  Daily weights  NUTRITION DIAGNOSIS:   Inadequate oral intake related to inability to eat (pt sedated and ventilated) as evidenced by NPO status.  GOAL:   Provide needs based on ASPEN/SCCM guidelines  MONITOR:   Vent status, Labs, Weight trends, I & O's, Skin  REASON FOR ASSESSMENT:   Ventilator    ASSESSMENT:   63 y.o. female with h/o HLD, COPD, asthma, tobacco use, DDD. chronic pain syndrome, anxiety, PTSD, IBS and GERD who is admitted with polypharmacy, OD, rhabdomyolysis, aspiration PNA and AKI.  Pt sedated and ventilated. OGT in place but needs advancement; RN aware. OGT to LIS with bloody output noted. GI consulted. Will plan to initiate tube feeds once appropriate. Pt is likely at refeed risk. Per chart, pt appears weight stable at baseline.   Medications reviewed and include: folic acid, protonix, thiamine, ceftriaxone  Labs reviewed: K 5.3(H), BUN 48(H), creat 2.07(H), P 4.2 wnl, Mg 2.0 wnl Wbc- 13.0(H) Cbgs- 139, 125, 142 x 24 hrs   Patient is currently intubated on ventilator support MV: 8.4 L/min Temp (24hrs), Avg:99.2 F (37.3 C), Min:96.5 F (35.8 C), Max:102 F (38.9 C)  MAP >68mmHg   UOP-   NUTRITION - FOCUSED PHYSICAL EXAM:  Flowsheet Row Most Recent Value  Orbital Region No depletion  Upper Arm Region No depletion  Thoracic and Lumbar Region No depletion  Buccal Region No depletion  Temple Region No depletion  Clavicle Bone Region No depletion  Clavicle and  Acromion Bone Region No depletion  Scapular Bone Region No depletion  Dorsal Hand No depletion  Patellar Region No depletion  Anterior Thigh Region No depletion  Posterior Calf Region No depletion  Edema (RD Assessment) None  Hair Reviewed  Eyes Reviewed  Mouth Reviewed  Skin Reviewed  Nails Reviewed   Diet Order:   Diet Order     None      EDUCATION NEEDS:   No education needs have been identified at this time  Skin:  Skin Assessment: Reviewed RN Assessment  Last BM:  2/12  Height:   Ht Readings from Last 1 Encounters:  12/04/23 5\' 8"  (1.727 m)    Weight:   Wt Readings from Last 1 Encounters:  12/05/23 81.9 kg    Ideal Body Weight:  70 kg  BMI:  Body mass index is 27.45 kg/m.  Estimated Nutritional Needs:   Kcal:  1909kcal/day  Protein:  110-125g/day  Fluid:  1.9-2.2L/day  Betsey Holiday MS, RD, LDN If unable to be reached, please send secure chat to "RD inpatient" available from 8:00a-4:00p daily

## 2023-12-05 NOTE — Progress Notes (Signed)
Noted red colored GI content from patient's OG that is connected to Low-intermittent suction. Approx. in patient's canister. Patient's abdomen looks distended but is soft to palpation. Patient did have eye opening upon palpation of abdomen. MD Assaker and NP Elvina Sidle made aware. Order received for additional dose of protonix and stat CBC.

## 2023-12-05 NOTE — Consult Note (Cosign Needed Addendum)
PHARMACY CONSULT NOTE - ELECTROLYTES  Pharmacy Consult for Electrolyte Monitoring and Replacement   Recent Labs: Height: 5\' 8"  (172.7 cm) Weight: 81.9 kg (180 lb 8.9 oz) IBW/kg (Calculated) : 63.9 Estimated Creatinine Clearance: 30.8 mL/min (A) (by C-G formula based on SCr of 2.07 mg/dL (H)). Potassium (mmol/L)  Date Value  12/05/2023 5.3 (H)   Magnesium (mg/dL)  Date Value  16/07/9603 2.0   Calcium (mg/dL)  Date Value  54/06/8118 8.0 (L)   Albumin (g/dL)  Date Value  14/78/2956 2.9 (L)   Phosphorus (mg/dL)  Date Value  21/30/8657 4.2   Sodium (mmol/L)  Date Value  12/05/2023 140    Assessment  Tami Hopkins is a 63 y.o. female presenting with acute hypoxic respiratory failure. PMH significant for HLD, chronic pain, PTSD, and GERD. Pharmacy has been consulted to monitor and replace electrolytes.  MIVF: NS @ 125 mL/hr Pertinent medications: N/A  Goal of Therapy: Electrolytes WNL Corrected Calcium = 8.9 K = 5.3 (elevated)  Plan:  Ca corrected, no further replacement needed at this time.  Check BMP, Mg, Phos with AM labs  Thank you for allowing pharmacy to be a part of this patient's care.  Effie Shy, PharmD Pharmacy Resident  12/05/2023 6:44 AM

## 2023-12-06 DIAGNOSIS — G928 Other toxic encephalopathy: Secondary | ICD-10-CM | POA: Diagnosis not present

## 2023-12-06 LAB — BASIC METABOLIC PANEL
Anion gap: 11 (ref 5–15)
BUN: 39 mg/dL — ABNORMAL HIGH (ref 8–23)
CO2: 19 mmol/L — ABNORMAL LOW (ref 22–32)
Calcium: 8.6 mg/dL — ABNORMAL LOW (ref 8.9–10.3)
Chloride: 114 mmol/L — ABNORMAL HIGH (ref 98–111)
Creatinine, Ser: 0.84 mg/dL (ref 0.44–1.00)
GFR, Estimated: >60 mL/min (ref >60)
Glucose, Bld: 115 mg/dL — ABNORMAL HIGH (ref 70–99)
Potassium: 4.4 mmol/L (ref 3.5–5.1)
Sodium: 144 mmol/L (ref 135–145)

## 2023-12-06 LAB — BLOOD GAS, ARTERIAL
Acid-Base Excess: 0.8 mmol/L (ref 0.0–2.0)
Bicarbonate: 24.2 mmol/L (ref 20.0–28.0)
FIO2: 40 %
MECHVT: 450 mL
Mechanical Rate: 12
O2 Saturation: 100 %
PEEP: 8 cmH2O
Patient temperature: 37
pCO2 arterial: 34 mmHg (ref 32–48)
pH, Arterial: 7.46 — ABNORMAL HIGH (ref 7.35–7.45)
pO2, Arterial: 137 mmHg — ABNORMAL HIGH (ref 83–108)

## 2023-12-06 LAB — GLUCOSE, CAPILLARY
Glucose-Capillary: 112 mg/dL — ABNORMAL HIGH (ref 70–99)
Glucose-Capillary: 115 mg/dL — ABNORMAL HIGH (ref 70–99)
Glucose-Capillary: 118 mg/dL — ABNORMAL HIGH (ref 70–99)
Glucose-Capillary: 118 mg/dL — ABNORMAL HIGH (ref 70–99)
Glucose-Capillary: 132 mg/dL — ABNORMAL HIGH (ref 70–99)
Glucose-Capillary: 137 mg/dL — ABNORMAL HIGH (ref 70–99)

## 2023-12-06 LAB — HEMOGLOBIN AND HEMATOCRIT, BLOOD
HCT: 40.2 % (ref 36.0–46.0)
Hemoglobin: 13.3 g/dL (ref 12.0–15.0)

## 2023-12-06 LAB — CBC
HCT: 42.7 % (ref 36.0–46.0)
Hemoglobin: 13.1 g/dL (ref 12.0–15.0)
MCH: 30.6 pg (ref 26.0–34.0)
MCHC: 30.7 g/dL (ref 30.0–36.0)
MCV: 99.8 fL (ref 80.0–100.0)
Platelets: 180 10*3/uL (ref 150–400)
RBC: 4.28 MIL/uL (ref 3.87–5.11)
RDW: 14.6 % (ref 11.5–15.5)
WBC: 24.7 10*3/uL — ABNORMAL HIGH (ref 4.0–10.5)
nRBC: 0 % (ref 0.0–0.2)

## 2023-12-06 LAB — PHOSPHORUS
Phosphorus: 1.5 mg/dL — ABNORMAL LOW (ref 2.5–4.6)
Phosphorus: 1.5 mg/dL — ABNORMAL LOW (ref 2.5–4.6)

## 2023-12-06 LAB — CK: Total CK: 1573 U/L — ABNORMAL HIGH (ref 38–234)

## 2023-12-06 LAB — MAGNESIUM
Magnesium: 2.3 mg/dL (ref 1.7–2.4)
Magnesium: 2.2 mg/dL (ref 1.7–2.4)
Magnesium: 2.1 mg/dL (ref 1.7–2.4)

## 2023-12-06 LAB — CALCIUM, IONIZED: Calcium, Ionized, Serum: 4.2 mg/dL — ABNORMAL LOW (ref 4.5–5.6)

## 2023-12-06 LAB — TRIGLYCERIDES: Triglycerides: 199 mg/dL — ABNORMAL HIGH (ref <150)

## 2023-12-06 MED ORDER — METHYLPREDNISOLONE SODIUM SUCC 40 MG IJ SOLR
40.0000 mg | Freq: Every day | INTRAMUSCULAR | Status: DC
  Administered 2023-12-06 – 2023-12-08 (×3): 40 mg via INTRAVENOUS
  Filled 2023-12-06 (×3): qty 1

## 2023-12-06 MED ORDER — VITAL AF 1.2 CAL PO LIQD
1000.0000 mL | ORAL | Status: DC
  Administered 2023-12-06 – 2023-12-07 (×2): 1000 mL

## 2023-12-06 MED ORDER — DOCUSATE SODIUM 50 MG/5ML PO LIQD
100.0000 mg | Freq: Two times a day (BID) | ORAL | Status: DC
  Administered 2023-12-06 – 2023-12-08 (×4): 100 mg
  Filled 2023-12-06 (×4): qty 10

## 2023-12-06 MED ORDER — ADULT MULTIVITAMIN W/MINERALS CH
1.0000 | ORAL_TABLET | Freq: Every day | ORAL | Status: DC
  Administered 2023-12-06 – 2023-12-07 (×2): 1
  Filled 2023-12-06 (×3): qty 1

## 2023-12-06 MED ORDER — FENTANYL BOLUS VIA INFUSION: 50.0000 ug | INTRAVENOUS | Status: DC | PRN

## 2023-12-06 MED ORDER — THIAMINE HCL 100 MG/ML IJ SOLN
100.0000 mg | Freq: Every day | INTRAMUSCULAR | Status: DC
  Administered 2023-12-06: 100 mg via INTRAVENOUS
  Filled 2023-12-06: qty 2

## 2023-12-06 MED ORDER — FREE WATER
30.0000 mL | Status: DC
  Administered 2023-12-06 – 2023-12-08 (×11): 30 mL

## 2023-12-06 MED ORDER — FENTANYL 2500MCG IN NS 250ML (10MCG/ML) PREMIX INFUSION
50.0000 ug/h | INTRAVENOUS | Status: DC
Start: 2023-12-06 — End: 2023-12-08
  Administered 2023-12-06: 50 ug/h via INTRAVENOUS
  Administered 2023-12-07: 75 ug/h via INTRAVENOUS
  Filled 2023-12-06 (×2): qty 250

## 2023-12-06 MED ORDER — FENTANYL CITRATE PF 50 MCG/ML IJ SOSY
50.0000 ug | PREFILLED_SYRINGE | Freq: Once | INTRAMUSCULAR | Status: AC
  Administered 2023-12-06: 50 ug via INTRAVENOUS
  Filled 2023-12-06: qty 1

## 2023-12-06 MED ORDER — HEPARIN SODIUM (PORCINE) 5000 UNIT/ML IJ SOLN
5000.0000 [IU] | Freq: Three times a day (TID) | INTRAMUSCULAR | Status: DC
  Administered 2023-12-06 – 2023-12-10 (×12): 5000 [IU] via SUBCUTANEOUS
  Filled 2023-12-06 (×12): qty 1

## 2023-12-06 MED ORDER — POLYETHYLENE GLYCOL 3350 17 G PO PACK
17.0000 g | PACK | Freq: Every day | ORAL | Status: DC
  Administered 2023-12-07 – 2023-12-08 (×2): 17 g
  Filled 2023-12-06 (×2): qty 1

## 2023-12-06 MED ORDER — FUROSEMIDE 40 MG PO TABS
40.0000 mg | ORAL_TABLET | Freq: Every day | ORAL | Status: DC
  Administered 2023-12-06 – 2023-12-10 (×5): 40 mg via ORAL
  Filled 2023-12-06 (×3): qty 2
  Filled 2023-12-06: qty 1
  Filled 2023-12-06: qty 2

## 2023-12-06 MED ORDER — THIAMINE MONONITRATE 100 MG PO TABS
100.0000 mg | ORAL_TABLET | Freq: Every day | ORAL | Status: DC
  Administered 2023-12-07 – 2023-12-08 (×2): 100 mg
  Filled 2023-12-06 (×2): qty 1

## 2023-12-06 MED ORDER — LACTATED RINGERS IV SOLN: INTRAVENOUS | Status: AC

## 2023-12-06 MED ORDER — VITAL HIGH PROTEIN PO LIQD: 1000.0000 mL | ORAL | Status: DC

## 2023-12-06 MED ORDER — OXYCODONE HCL 5 MG PO TABS
5.0000 mg | ORAL_TABLET | Freq: Four times a day (QID) | ORAL | Status: DC
  Administered 2023-12-06 – 2023-12-08 (×9): 5 mg via ORAL
  Filled 2023-12-06 (×9): qty 1

## 2023-12-06 MED ORDER — IPRATROPIUM-ALBUTEROL 0.5-2.5 (3) MG/3ML IN SOLN
3.0000 mL | RESPIRATORY_TRACT | Status: DC
  Administered 2023-12-06 – 2023-12-08 (×15): 3 mL via RESPIRATORY_TRACT
  Filled 2023-12-06 (×15): qty 3

## 2023-12-06 MED ORDER — PREDNISONE 10 MG PO TABS: 40.0000 mg | ORAL_TABLET | Freq: Every day | ORAL | Status: DC

## 2023-12-06 MED ORDER — LABETALOL HCL 5 MG/ML IV SOLN: 10.0000 mg | INTRAVENOUS | Status: DC | PRN

## 2023-12-06 NOTE — Plan of Care (Signed)
Patient's daughter, Morrie Sheldon, notified this nurse that police have been contacted due to the circumstances in which patient was found prior to admission. ME to be contacted if death occurs. Provider notified.  Problem: Nutrition: Goal: Adequate nutrition will be maintained Outcome: Progressing   Problem: Elimination: Goal: Will not experience complications related to bowel motility Outcome: Progressing

## 2023-12-06 NOTE — Progress Notes (Signed)
Social worker from [place] came to see patient. Nurse not present at time, phone number provided below. This nurse attempted to contact at number listed. No answer.  Noemi Chapel [phone]  [email or web address]

## 2023-12-06 NOTE — Plan of Care (Signed)

## 2023-12-06 NOTE — Progress Notes (Signed)
NAME:  Tami Hopkins, MRN:  [id number], DOB:  01-09-1961, LOS: 2 ADMISSION DATE:  12/04/2023 History of Present Illness:  Tami Hopkins is a 63 y.o. female with a PMH significant for HLD, COPD, Asthma, Tobacco use, Chronic pain syndrome, Anxiety, PTSD, IBS, and GERD who presents to the ED for altered mental status.  Per chart review, family had not heard from the patient for the past 2 days and called for a wellness check.  Patient subsequently found unresponsive sitting on the couch, roommate stated that they thought patient had been sleeping on the couch for the past few days.  Of note, patient was admitted to the hospital a couple of months ago for altered mental status due to suspected polypharmacy use.  Upon EMS arrival patient was given 2 mg of intranasal Narcan with no effect, subsequently given 2 mg IV Narcan with slight respiratory improvement, yet remained unresponsive.  Upon arrival to the ER, BP 94/78, afebrile, 100% on room air, she was noted to have vomit in her airway during suctioning. She was intubated for airway protection. Labs revealed K 6.0, Creat 3.72, BUN 62, AST 162, ALT 85, CK 4155, Troponin 91 and 74, lactic acid 1.6, WBC 19.0, Drug screen + benzodiazepine, opiate, tricyclics. She was given a total of 3 liters of IVF, started on broad spectrum antibiotics, cultures sent, and started on Norepinephrine infusion. CT c-spine without noted fracture, CT head without acute pathology, CT A/P/T bilateral atelectasis, stable non obstructing right renal calculi, hepatic steatosis, aortic atherosclerosis, no acute intra abdominal process.     PCCM was consulted for Medical Management in this critically ill patient in septic shock and respiratory failure suspected due to polysubstance abuse and aspiration Pneumonia  Pertinent  Medical History  HLD, Chronic Pain Syndrome, Asthma, Tobacco Use, Anxiety, PTSD   Significant Hospital Events: Including procedures, antibiotic start and stop  dates in addition to other pertinent events   2/12: Admitted with AMS due to polysubstance abuse. Septic shock, levophed, cultures sent, intubated  02/13: Brainstem reflexes improved today with regain of cough, gag, pupils reactive.   Interim History / Subjective:  No major events overngiht.  MRI brain w/ No acute abnormality.  Started on propofol for hypertension and agitation.  Objective   Blood pressure (!) 163/105, pulse (!) 106, temperature 98 F (36.7 C), temperature source Axillary, resp. rate (!) 22, height 5\' 8"  (1.727 m), weight 78.7 kg, SpO2 100%.    Vent Mode: PRVC FiO2 (%):  [40 %-50 %] 40 % Set Rate:  [20 bmp] 20 bmp Vt Set:  [450 mL] 450 mL PEEP:  [8 cmH20] 8 cmH20 Plateau Pressure:  [13 cmH20-19 cmH20] 19 cmH20   Intake/Output Summary (Last 24 hours) at 12/06/2023 0955 Last data filed at 12/06/2023 0900 Gross per 24 hour  Intake 948.05 ml  Output 1875 ml  Net -926.95 ml   Filed Weights   12/05/23 0028 12/05/23 0500 12/06/23 0500  Weight: 81.9 kg 81.9 kg 78.7 kg    Examination: GEN patient intubated without sedation, gag and cough present. HEENT supple neck, reactive pupil, corneal reflexes present. CVS normal S1, normal S2, regular rate and rhythm Lungs clear bilateral air entry Abdomen soft nontender nondistended positive bowel sound Extremities warm well-perfused no edema  Labs and imaging were reviewed.   Assessment & Plan:  63 year old female patient with past medical history of chronic pain on chronic opioid use presenting with altered mental status and low GCS.  Intubated in the emergency department on  12/04/2023.  Remained with a low GCS.  Thought to be secondary to oxycodone overdose.  # Toxic encephalopathy secondary to oxycodone overdose with U-Tox positive for opioids, benzos. MRI 02/14 normal.  # Suspected aspiration pneumonia # Rhabdo with CK 4000 --> 2200 # Suspecting GI bleed with dark output from her OG tube.  Resolved later during the  day. H&H stable today.   Neuro: Restart home baclofen 10 mg twice daily.  Hold BuSpar Klonopin Neurontin Seroquel and Percocet.  Restart oxycodone 5 Q6H. Pulmonary: Duonebs scheduled. Pred 40 daily. Ceftriaxone daily. ABG.  CVS Target MAP greater than 65. Restart home furosemide.  GI start TF. IV PPI twice daily. Renal  CK in the morning.  Monitor urine output.  Avoid nephrotoxic agent. Heme: SCDs for now.  Start heparin Dane dvt prophy.   Best Practice (right click and "Reselect all SmartList Selections" daily)   Diet/type: TF DVT prophylaxis Heparin [place]  Pressure ulcer(s): N/A GI prophylaxis: PPI Lines: N/A Foley:  Yes, and it is still needed Code Status:  full code  Last date of multidisciplinary goals of care discussion [12/06/2023]  I spent 50 minutes caring for this patient today, including preparing to see the patient, obtaining a medical history , reviewing a separately obtained history, performing a medically appropriate examination and/or evaluation, counseling and educating the patient/family/caregiver, ordering medications, tests, or procedures, documenting clinical information in the electronic health record, and independently interpreting results (not separately reported/billed) and communicating results to the patient/family/caregiver  Janann Colonel, MD [facility] 12/06/2023 9:55 AM

## 2023-12-06 NOTE — Progress Notes (Signed)
Nutrition Follow-up  DOCUMENTATION CODES:   Not applicable  INTERVENTION:   -TF via OGT:   Initiate Vital 1.2 @ 25 ml/hr and increase by 10 ml every 8 hours to goal rate of 65 ml/hr.   30 ml free water flush every 4 hours  Tube feeding regimen provides 1872 kcal (100% of needs), 117 grams of protein, and 1145 ml of H2O.    -Continue 100 mg thiamine daily -MVI with minerals daily via tube x 10 days -Daily weights -Monitor Mg, K, and Phos and replete as needed secondary to high refeeding risk   NUTRITION DIAGNOSIS:   Inadequate oral intake related to inability to eat (pt sedated and ventilated) as evidenced by NPO status.  Ongoing   GOAL:   Provide needs based on ASPEN/SCCM guidelines  Progressing   MONITOR:   Vent status, Labs, Weight trends, I & O's, Skin  REASON FOR ASSESSMENT:   Consult Enteral/tube feeding initiation and management  ASSESSMENT:   63 y.o. female with h/o HLD, COPD, asthma, tobacco use, DDD. chronic pain syndrome, anxiety, PTSD, IBS and GERD who is admitted with polypharmacy, OD, rhabdomyolysis, aspiration PNA and AKI.  Patient is currently intubated on ventilator support. NGT currently connected to low, intermittent suction. Per KUB on 12/05/23; tip of tube confirmed in stomach.  MV: 10.8 L/min Temp (24hrs), Avg:98.5 F (36.9 C), Min:97.2 F (36.2 C), Max:99.6 F (37.6 C)  Propofol: 22.1 ml/hr (provides 583 kcals daily)  Reviewed I/O's: -660 ml x 24 hours and -718 ml since admission  UOP: 1.4 L x 24 hours  NGT output: 400 ml x 24 hours  Per GI notes, Hgb stable and NGT output has cleared with no further blood/ red output. No plan for further GI work-up at this time.   Per PCCM notes, pt with no major event overnight and MRI revealed no acute abnormality. Pt started on propofol secondary to HTN and agitation. H&H stable today. Per PCCM, ok to start TF today.   Medications reviewed and include folic acid, lasix, solu-medrol, protonix,  thiamine, and propofol.   Labs reviewed: K, Mg, and Phos WDL. CBGS: 112-137 (inpatient orders for glycemic control are none).    Diet Order:   Diet Order     None       EDUCATION NEEDS:   No education needs have been identified at this time  Skin:  Skin Assessment: Reviewed RN Assessment  Last BM:  12/06/23 (type 6)  Height:   Ht Readings from Last 1 Encounters:  12/04/23 5\' 8"  (1.727 m)    Weight:   Wt Readings from Last 1 Encounters:  12/06/23 78.7 kg    Ideal Body Weight:  70 kg  BMI:  Body mass index is 26.38 kg/m.  Estimated Nutritional Needs:   Kcal:  1736  Protein:  110-125g/day  Fluid:  1.9-2.2L/day    Levada Schilling, RD, LDN, CDCES Registered Dietitian III Certified Diabetes Care and Education Specialist If unable to reach this RD, please use "RD Inpatient" group chat on secure chat between hours of 8am-4 pm daily

## 2023-12-06 NOTE — Progress Notes (Signed)
The patient's hemoglobin has been stable and the NG tube has cleared up without any blood.  I spoke to the ICU team and we will hold off on any GI workup or consultation at this time unless the patient should show any sign of bleeding or drop in hemoglobin.  Please do not hesitate to call if any change in the patient's status occurs.

## 2023-12-06 NOTE — Consult Note (Signed)
PHARMACY CONSULT NOTE - ELECTROLYTES  Pharmacy Consult for Electrolyte Monitoring and Replacement   Recent Labs: Height: 5\' 8"  (172.7 cm) Weight: 78.7 kg (173 lb 8 oz) IBW/kg (Calculated) : 63.9 Estimated Creatinine Clearance: 74.6 mL/min (by C-G formula based on SCr of 0.84 mg/dL). Potassium (mmol/L)  Date Value  12/06/2023 4.4   Magnesium (mg/dL)  Date Value  55/73/2202 2.3   Calcium (mg/dL)  Date Value  54/27/0623 8.6 (L)   Albumin (g/dL)  Date Value  76/28/3151 2.9 (L)   Phosphorus (mg/dL)  Date Value  76/16/0737 4.2   Sodium (mmol/L)  Date Value  12/06/2023 144    Assessment  Tami Hopkins is a 63 y.o. female presenting with acute hypoxic respiratory failure. PMH significant for HLD, chronic pain, PTSD, and GERD. Pharmacy has been consulted to monitor and replace electrolytes.  MIVF: N/A Pertinent medications: Norepi, furosemide 40 mg daily   Goal of Therapy: Electrolytes WNL Corrected Calcium = 9.5  Plan:  No replacement indicated at this time Check BMP, Mg, Phos with AM labs  Thank you for allowing pharmacy to be a part of this patient's care.  Effie Shy, PharmD Pharmacy Resident  12/06/2023 8:17 AM

## 2023-12-07 DIAGNOSIS — G928 Other toxic encephalopathy: Secondary | ICD-10-CM | POA: Diagnosis not present

## 2023-12-07 LAB — CBC
HCT: 37.5 % (ref 36.0–46.0)
Hemoglobin: 12.3 g/dL (ref 12.0–15.0)
MCH: 31.5 pg (ref 26.0–34.0)
MCHC: 32.8 g/dL (ref 30.0–36.0)
MCV: 96.2 fL (ref 80.0–100.0)
Platelets: 160 10*3/uL (ref 150–400)
RBC: 3.9 MIL/uL (ref 3.87–5.11)
RDW: 14.6 % (ref 11.5–15.5)
WBC: 15.5 10*3/uL — ABNORMAL HIGH (ref 4.0–10.5)
nRBC: 0 % (ref 0.0–0.2)

## 2023-12-07 LAB — GLUCOSE, CAPILLARY
Glucose-Capillary: 116 mg/dL — ABNORMAL HIGH (ref 70–99)
Glucose-Capillary: 126 mg/dL — ABNORMAL HIGH (ref 70–99)
Glucose-Capillary: 129 mg/dL — ABNORMAL HIGH (ref 70–99)
Glucose-Capillary: 135 mg/dL — ABNORMAL HIGH (ref 70–99)
Glucose-Capillary: 138 mg/dL — ABNORMAL HIGH (ref 70–99)
Glucose-Capillary: 163 mg/dL — ABNORMAL HIGH (ref 70–99)

## 2023-12-07 LAB — BASIC METABOLIC PANEL
Anion gap: 11 (ref 5–15)
BUN: 36 mg/dL — ABNORMAL HIGH (ref 8–23)
CO2: 22 mmol/L (ref 22–32)
Calcium: 8.8 mg/dL — ABNORMAL LOW (ref 8.9–10.3)
Chloride: 114 mmol/L — ABNORMAL HIGH (ref 98–111)
Creatinine, Ser: 0.74 mg/dL (ref 0.44–1.00)
GFR, Estimated: >60 mL/min (ref >60)
Glucose, Bld: 120 mg/dL — ABNORMAL HIGH (ref 70–99)
Potassium: 3.9 mmol/L (ref 3.5–5.1)
Sodium: 147 mmol/L — ABNORMAL HIGH (ref 135–145)

## 2023-12-07 LAB — MAGNESIUM
Magnesium: 2.2 mg/dL (ref 1.7–2.4)
Magnesium: 1.9 mg/dL (ref 1.7–2.4)

## 2023-12-07 LAB — PHOSPHORUS
Phosphorus: 2.1 mg/dL — ABNORMAL LOW (ref 2.5–4.6)
Phosphorus: 2.3 mg/dL — ABNORMAL LOW (ref 2.5–4.6)

## 2023-12-07 MED ORDER — K PHOS MONO-SOD PHOS DI & MONO 155-852-130 MG PO TABS
500.0000 mg | ORAL_TABLET | ORAL | Status: AC
  Administered 2023-12-07 (×2): 500 mg via ORAL
  Filled 2023-12-07 (×3): qty 2

## 2023-12-07 MED ORDER — PANTOPRAZOLE SODIUM 40 MG IV SOLR
40.0000 mg | INTRAVENOUS | Status: DC
  Administered 2023-12-08 – 2023-12-11 (×4): 40 mg via INTRAVENOUS
  Filled 2023-12-07 (×5): qty 10

## 2023-12-07 MED ORDER — SODIUM CHLORIDE 0.9 % IV BOLUS
500.0000 mL | Freq: Once | INTRAVENOUS | Status: AC
  Administered 2023-12-07: 500 mL via INTRAVENOUS

## 2023-12-07 MED ORDER — ACETAMINOPHEN 325 MG PO TABS
650.0000 mg | ORAL_TABLET | Freq: Four times a day (QID) | ORAL | Status: DC | PRN
  Administered 2023-12-07 – 2023-12-08 (×3): 650 mg
  Filled 2023-12-07 (×2): qty 2

## 2023-12-07 NOTE — Progress Notes (Signed)
 NAME:  Tami Hopkins, MRN:  [id number], DOB:  Aug 14, 1961, LOS: 3 ADMISSION DATE:  12/04/2023 History of Present Illness:  Tami Hopkins is a 63 y.o. female with a PMH significant for HLD, COPD, Asthma, Tobacco use, Chronic pain syndrome, Anxiety, PTSD, IBS, and GERD who presents to the ED for altered mental status.  Per chart review, family had not heard from the patient for the past 2 days and called for a wellness check.  Patient subsequently found unresponsive sitting on the couch, roommate stated that they thought patient had been sleeping on the couch for the past few days.  Of note, patient was admitted to the hospital a couple of months ago for altered mental status due to suspected polypharmacy use.  Upon EMS arrival patient was given 2 mg of intranasal Narcan with no effect, subsequently given 2 mg IV Narcan with slight respiratory improvement, yet remained unresponsive.  Upon arrival to the ER, BP 94/78, afebrile, 100% on room air, she was noted to have vomit in her airway during suctioning. She was intubated for airway protection. Labs revealed K 6.0, Creat 3.72, BUN 62, AST 162, ALT 85, CK 4155, Troponin 91 and 74, lactic acid 1.6, WBC 19.0, Drug screen + benzodiazepine, opiate, tricyclics. She was given a total of 3 liters of IVF, started on broad spectrum antibiotics, cultures sent, and started on Norepinephrine infusion. CT c-spine without noted fracture, CT head without acute pathology, CT A/P/T bilateral atelectasis, stable non obstructing right renal calculi, hepatic steatosis, aortic atherosclerosis, no acute intra abdominal process.     PCCM was consulted for Medical Management in this critically ill patient in septic shock and respiratory failure suspected due to polysubstance abuse and aspiration Pneumonia  Pertinent  Medical History  HLD, Chronic Pain Syndrome, Asthma, Tobacco Use, Anxiety, PTSD   Significant Hospital Events: Including procedures, antibiotic start and stop  dates in addition to other pertinent events   2/12: Admitted with AMS due to polysubstance abuse. Septic shock, levophed, cultures sent, intubated  02/13: Brainstem reflexes improved today with regain of cough, gag, pupils reactive.  02/14: Improved mental status but not yet following commands.   Interim History / Subjective:  No major events overngiht.  MRI brain w/ No acute abnormality.  Started on propofol for hypertension and agitation.   Objective   Blood pressure 117/82, pulse 86, temperature 98.8 F (37.1 C), resp. rate 10, height 5\' 8"  (1.727 m), weight 80 kg, SpO2 98%.    Vent Mode: PRVC FiO2 (%):  [28 %-30 %] 28 % Set Rate:  [12 bmp-18 bmp] 12 bmp Vt Set:  [400 mL-450 mL] 400 mL PEEP:  [8 cmH20] 8 cmH20   Intake/Output Summary (Last 24 hours) at 12/07/2023 1238 Last data filed at 12/07/2023 0536 Gross per 24 hour  Intake 2245.37 ml  Output 2550 ml  Net -304.63 ml   Filed Weights   12/05/23 0500 12/06/23 0500 12/07/23 0423  Weight: 81.9 kg 78.7 kg 80 kg    Examination: GEN patient intubated without sedation, gag, cough, corneal and pupillary reflexes present.  HEENT supple neck, reactive pupil, corneal reflexes present. CVS normal S1, normal S2, regular rate and rhythm Lungs clear bilateral air entry Abdomen soft nontender nondistended positive bowel sound Extremities warm well-perfused no edema  Labs and imaging were reviewed.   Assessment & Plan:  63 year old female patient with past medical history of chronic pain on chronic opioid use presenting with altered mental status and low GCS.  Intubated in the  emergency department on 12/04/2023.  Remained with a low GCS.  Thought to be secondary to oxycodone overdose.  # Toxic encephalopathy secondary to oxycodone overdose with U-Tox positive for opioids, benzos. MRI 02/14 normal.  # Suspected aspiration pneumonia # Rhabdo with CK 4000 --> 2200 --> 1500 #Chronic pain on chronic opioids.  Neuro: c/w home baclofen  10 mg twice daily.  Holding BuSpar Klonopin Neurontin Seroquel and Percocet.  Restart oxycodone 5 Q6H. SAT today.  Pulmonary: Duonebs scheduled. Pred 40 daily. Ceftriaxone daily for 5 days. CVS Target MAP greater than 65. C/w furosemide.  GI start TF. IV PPI twice daily. Renal  CK in the morning.  Monitor urine output.  Avoid nephrotoxic agent. Heme: SCDs for now.  Start heparin [place] dvt prophy.   Best Practice (right click and "Reselect all SmartList Selections" daily)   Diet/type: TF DVT prophylaxis Heparin [place]  Pressure ulcer(s): N/A GI prophylaxis: PPI Lines: N/A Foley:  Yes, and it is still needed Code Status:  full code  Last date of multidisciplinary goals of care discussion [12/07/2023]  Critical Care time: 35 min.   Janann Colonel, MD [facility] 12/07/2023 12:42 PM

## 2023-12-07 NOTE — Plan of Care (Signed)

## 2023-12-07 NOTE — Consult Note (Signed)
 PHARMACY CONSULT NOTE - ELECTROLYTES  Pharmacy Consult for Electrolyte Monitoring and Replacement   Recent Labs: Height: 5\' 8"  (172.7 cm) Weight: 80 kg (176 lb 5.9 oz) IBW/kg (Calculated) : 63.9 Estimated Creatinine Clearance: 78.8 mL/min (by C-G formula based on SCr of 0.74 mg/dL). Potassium (mmol/L)  Date Value  12/07/2023 3.9   Magnesium (mg/dL)  Date Value  16/07/9603 2.2   Calcium (mg/dL)  Date Value  54/06/8118 8.8 (L)   Albumin (g/dL)  Date Value  14/78/2956 2.9 (L)   Phosphorus (mg/dL)  Date Value  21/30/8657 2.1 (L)   Sodium (mmol/L)  Date Value  12/07/2023 147 (H)    Assessment  Tami Hopkins is a 63 y.o. female presenting with acute hypoxic respiratory failure. PMH significant for HLD, chronic pain, PTSD, and GERD. Pharmacy has been consulted to monitor and replace electrolytes.  MIVF: LR @ 125 ml/hr.  Pertinent medications: furosemide 40 mg daily, free water 30 ml q4H.   Goal of Therapy: Electrolytes WNL Corrected Calcium = 9.5  Plan:  Kphos 2 tab every 4 hours x 2 doses.  F/u with AM labs.  Thank you for allowing pharmacy to be a part of this patient's care.  Ronnald Ramp, PharmD 12/07/2023 7:05 AM

## 2023-12-08 ENCOUNTER — Inpatient Hospital Stay: Payer: Medicare HMO

## 2023-12-08 DIAGNOSIS — G928 Other toxic encephalopathy: Secondary | ICD-10-CM | POA: Diagnosis not present

## 2023-12-08 LAB — CBC
HCT: 33.4 % — ABNORMAL LOW (ref 36.0–46.0)
Hemoglobin: 10.8 g/dL — ABNORMAL LOW (ref 12.0–15.0)
MCH: 30.9 pg (ref 26.0–34.0)
MCHC: 32.3 g/dL (ref 30.0–36.0)
MCV: 95.7 fL (ref 80.0–100.0)
Platelets: 158 10*3/uL (ref 150–400)
RBC: 3.49 MIL/uL — ABNORMAL LOW (ref 3.87–5.11)
RDW: 14.6 % (ref 11.5–15.5)
WBC: 11.5 10*3/uL — ABNORMAL HIGH (ref 4.0–10.5)
nRBC: 0 % (ref 0.0–0.2)

## 2023-12-08 LAB — GLUCOSE, CAPILLARY
Glucose-Capillary: 111 mg/dL — ABNORMAL HIGH (ref 70–99)
Glucose-Capillary: 117 mg/dL — ABNORMAL HIGH (ref 70–99)
Glucose-Capillary: 168 mg/dL — ABNORMAL HIGH (ref 70–99)
Glucose-Capillary: 133 mg/dL — ABNORMAL HIGH (ref 70–99)
Glucose-Capillary: 124 mg/dL — ABNORMAL HIGH (ref 70–99)
Glucose-Capillary: 129 mg/dL — ABNORMAL HIGH (ref 70–99)

## 2023-12-08 LAB — BASIC METABOLIC PANEL
Anion gap: 10 (ref 5–15)
BUN: 33 mg/dL — ABNORMAL HIGH (ref 8–23)
CO2: 26 mmol/L (ref 22–32)
Calcium: 8.7 mg/dL — ABNORMAL LOW (ref 8.9–10.3)
Chloride: 116 mmol/L — ABNORMAL HIGH (ref 98–111)
Creatinine, Ser: 0.63 mg/dL (ref 0.44–1.00)
GFR, Estimated: >60 mL/min (ref >60)
Glucose, Bld: 125 mg/dL — ABNORMAL HIGH (ref 70–99)
Potassium: 2.9 mmol/L — ABNORMAL LOW (ref 3.5–5.1)
Sodium: 152 mmol/L — ABNORMAL HIGH (ref 135–145)

## 2023-12-08 LAB — PROCALCITONIN: Procalcitonin: 0.3 ng/mL

## 2023-12-08 LAB — POTASSIUM: Potassium: 3.5 mmol/L (ref 3.5–5.1)

## 2023-12-08 MED ORDER — OXYCODONE HCL 5 MG PO TABS
5.0000 mg | ORAL_TABLET | Freq: Four times a day (QID) | ORAL | Status: DC | PRN
  Administered 2023-12-08 – 2024-01-02 (×28): 5 mg via ORAL
  Filled 2023-12-08 (×31): qty 1

## 2023-12-08 MED ORDER — POTASSIUM CHLORIDE 20 MEQ PO PACK: 40.0000 meq | PACK | Freq: Two times a day (BID) | ORAL | Status: DC

## 2023-12-08 MED ORDER — POTASSIUM CHLORIDE 10 MEQ/100ML IV SOLN
10.0000 meq | INTRAVENOUS | Status: AC
  Administered 2023-12-08 (×2): 10 meq via INTRAVENOUS
  Filled 2023-12-08 (×2): qty 100

## 2023-12-08 MED ORDER — FOLIC ACID 1 MG PO TABS
1.0000 mg | ORAL_TABLET | Freq: Every day | ORAL | Status: DC
  Administered 2023-12-09 – 2024-01-02 (×25): 1 mg via ORAL
  Filled 2023-12-08 (×25): qty 1

## 2023-12-08 MED ORDER — THIAMINE MONONITRATE 100 MG PO TABS
100.0000 mg | ORAL_TABLET | Freq: Every day | ORAL | Status: DC
  Administered 2023-12-09 – 2024-01-02 (×25): 100 mg via ORAL
  Filled 2023-12-08 (×25): qty 1

## 2023-12-08 MED ORDER — ORAL CARE MOUTH RINSE: 15.0000 mL | OROMUCOSAL | Status: DC | PRN

## 2023-12-08 MED ORDER — SODIUM CHLORIDE 0.9 % IV BOLUS
500.0000 mL | Freq: Once | INTRAVENOUS | Status: AC
  Administered 2023-12-08: 500 mL via INTRAVENOUS

## 2023-12-08 MED ORDER — ADULT MULTIVITAMIN W/MINERALS CH
1.0000 | ORAL_TABLET | Freq: Every day | ORAL | Status: AC
Start: 2023-12-09 — End: 2023-12-15
  Administered 2023-12-09 – 2023-12-15 (×7): 1 via ORAL
  Filled 2023-12-08 (×7): qty 1

## 2023-12-08 MED ORDER — METHYLPREDNISOLONE SODIUM SUCC 40 MG IJ SOLR
40.0000 mg | Freq: Every day | INTRAMUSCULAR | Status: AC
  Administered 2023-12-09: 40 mg via INTRAVENOUS
  Filled 2023-12-08: qty 1

## 2023-12-08 MED ORDER — MONTELUKAST SODIUM 10 MG PO TABS
10.0000 mg | ORAL_TABLET | Freq: Every day | ORAL | Status: DC
  Administered 2023-12-08 – 2024-01-02 (×25): 10 mg via ORAL
  Filled 2023-12-08 (×26): qty 1

## 2023-12-08 MED ORDER — POTASSIUM CHLORIDE 20 MEQ PO PACK
40.0000 meq | PACK | Freq: Once | ORAL | Status: AC
  Administered 2023-12-08: 40 meq
  Filled 2023-12-08: qty 2

## 2023-12-08 MED ORDER — LOPERAMIDE HCL 2 MG PO CAPS
2.0000 mg | ORAL_CAPSULE | ORAL | Status: DC | PRN
  Administered 2023-12-08 – 2023-12-09 (×2): 2 mg via ORAL
  Filled 2023-12-08 (×2): qty 1

## 2023-12-08 MED ORDER — DEXTROSE 5 % IV SOLN: INTRAVENOUS | Status: AC

## 2023-12-08 MED ORDER — IPRATROPIUM-ALBUTEROL 0.5-2.5 (3) MG/3ML IN SOLN
3.0000 mL | Freq: Three times a day (TID) | RESPIRATORY_TRACT | Status: DC
  Administered 2023-12-09 – 2023-12-11 (×6): 3 mL via RESPIRATORY_TRACT
  Filled 2023-12-08 (×7): qty 3

## 2023-12-08 MED ORDER — POTASSIUM CHLORIDE 20 MEQ PO PACK
40.0000 meq | PACK | Freq: Two times a day (BID) | ORAL | Status: DC
  Administered 2023-12-08 – 2023-12-10 (×4): 40 meq via ORAL
  Filled 2023-12-08 (×4): qty 2

## 2023-12-08 MED ORDER — THIAMINE HCL 100 MG/ML IJ SOLN
100.0000 mg | Freq: Every day | INTRAMUSCULAR | Status: DC
  Filled 2023-12-08 (×4): qty 2

## 2023-12-08 MED ORDER — POTASSIUM CHLORIDE CRYS ER 20 MEQ PO TBCR: 40.0000 meq | EXTENDED_RELEASE_TABLET | Freq: Once | ORAL | Status: DC

## 2023-12-08 MED ORDER — POTASSIUM CHLORIDE 20 MEQ/100ML IV SOLN: 20.0000 meq | Freq: Once | INTRAVENOUS | Status: DC

## 2023-12-08 NOTE — Clinical Note (Incomplete)
Likely oxy OD Left for 2 days by bf MRI 2/13 appears normal Not moving RUE and RLE, twitching on R Fevers Diminished reflexes  MRI? Ceribell?

## 2023-12-08 NOTE — Consult Note (Signed)
 PHARMACY CONSULT NOTE - ELECTROLYTES  Pharmacy Consult for Electrolyte Monitoring and Replacement   Recent Labs: Height: 5\' 8"  (172.7 cm) Weight: 85 kg (187 lb 6.3 oz) IBW/kg (Calculated) : 63.9 Estimated Creatinine Clearance: 81.1 mL/min (by C-G formula based on SCr of 0.63 mg/dL). Potassium (mmol/L)  Date Value  12/08/2023 2.9 (L)   Magnesium (mg/dL)  Date Value  40/98/1191 1.9   Calcium (mg/dL)  Date Value  47/82/9562 8.7 (L)   Albumin (g/dL)  Date Value  13/05/6577 2.9 (L)   Phosphorus (mg/dL)  Date Value  46/96/2952 2.3 (L)   Sodium (mmol/L)  Date Value  12/08/2023 152 (H)    Assessment  Tami Hopkins is a 63 y.o. female presenting with acute hypoxic respiratory failure. PMH significant for HLD, chronic pain, PTSD, and GERD. Pharmacy has been consulted to monitor and replace electrolytes.  MIVF: LR @ 125 ml/hr. - completed.  Pertinent medications: furosemide 40 mg daily, free water 30 ml q4H.   Goal of Therapy: Electrolytes WNL   Plan:  Kcl 40 mEq Bid ordered by medical team with a repeat K+ @ 1000.     Thank you for allowing pharmacy to be a part of this patient's care.  Ronnald Ramp, PharmD 12/08/2023 7:52 AM

## 2023-12-08 NOTE — Progress Notes (Signed)
 NAME:  GENIE WENKE, MRN:  [id number], DOB:  [birth year], LOS: 4 ADMISSION DATE:  12/04/2023 History of Present Illness:  Tami Hopkins is a 63 y.o. female with a PMH significant for HLD, COPD, Asthma, Tobacco use, Chronic pain syndrome, Anxiety, PTSD, IBS, and GERD who presents to the ED for altered mental status.  Per chart review, family had not heard from the patient for the past 2 days and called for a wellness check.  Patient subsequently found unresponsive sitting on the couch, roommate stated that they thought patient had been sleeping on the couch for the past few days.  Of note, patient was admitted to the hospital a couple of months ago for altered mental status due to suspected polypharmacy use.  Upon EMS arrival patient was given 2 mg of intranasal Narcan with no effect, subsequently given 2 mg IV Narcan with slight respiratory improvement, yet remained unresponsive.  Upon arrival to the ER, BP 94/78, afebrile, 100% on room air, she was noted to have vomit in her airway during suctioning. She was intubated for airway protection. Labs revealed K 6.0, Creat 3.72, BUN 62, AST 162, ALT 85, CK 4155, Troponin 91 and 74, lactic acid 1.6, WBC 19.0, Drug screen + benzodiazepine, opiate, tricyclics. She was given a total of 3 liters of IVF, started on broad spectrum antibiotics, cultures sent, and started on Norepinephrine infusion. CT c-spine without noted fracture, CT head without acute pathology, CT A/P/T bilateral atelectasis, stable non obstructing right renal calculi, hepatic steatosis, aortic atherosclerosis, no acute intra abdominal process.     PCCM was consulted for Medical Management in this critically ill patient in septic shock and respiratory failure suspected due to polysubstance abuse and aspiration Pneumonia  Pertinent  Medical History  HLD, Chronic Pain Syndrome, Asthma, Tobacco Use, Anxiety, PTSD   Significant Hospital Events: Including procedures, antibiotic start and stop  dates in addition to other pertinent events   2/12: Admitted with AMS due to polysubstance abuse. Septic shock, levophed, cultures sent, intubated  02/13: Brainstem reflexes improved today with regain of cough, gag, pupils reactive.  02/14: Improved mental status but not yet following commands.  02/16: Extubated to nasal cannula.  With right sided weakness in upper and lower extremity and facial twitches. Febrile to 101. Neurology is consulted.   Interim History / Subjective:  No major events overngiht.  MRI brain 02/13  w/ No acute abnormality.  Started on propofol for hypertension and agitation.   Objective   Blood pressure (!) 141/86, pulse 99, temperature (!) 100.8 F (38.2 C), resp. rate 12, height 5\' 8"  (1.727 m), weight 85 kg, SpO2 96%.    Vent Mode: PSV FiO2 (%):  [28 %] 28 % PEEP:  [8 cmH20] 8 cmH20 Pressure Support:  [5 cmH20] 5 cmH20   Intake/Output Summary (Last 24 hours) at 12/08/2023 1403 Last data filed at 12/08/2023 7829 Gross per 24 hour  Intake 1982.47 ml  Output 1850 ml  Net 132.47 ml   Filed Weights   12/06/23 0500 12/07/23 0423 12/08/23 0408  Weight: 78.7 kg 80 kg 85 kg   Subjective  Patient respiratory status improving. Prioceeding with extubation.   Examination: GEN Comfortable on PSV   HEENT supple neck, reactive pupil, corneal reflexes present. CVS normal S1, normal S2, regular rate and rhythm Lungs clear bilateral air entry Abdomen soft nontender nondistended positive bowel sound Extremities warm well-perfused no edema Neuro: 0/5 motor on RUE and RLE. 3/5 LUE and LLE. Follows minimal copmmands. Fascial twitches  noted.   Labs and imaging were reviewed.  Assessment & Plan:  63 year old female patient with past medical history of chronic pain on chronic opioid use presenting with altered mental status and low GCS.  Intubated in the emergency department on 12/04/2023.  Remained with a low GCS.  Thought to be secondary to oxycodone overdose.  # Toxic  encephalopathy secondary to oxycodone overdose with U-Tox positive for opioids, benzos. MRI 02/14 normal.  # Suspected aspiration pneumonia #Suspecting COPD exacerbation  # Rhabdo with CK 4000 --> 2200 --> 1500 #Chronic pain on chronic opioids.  Neuro: c/w home baclofen 10 mg twice daily.  Holding BuSpar Klonopin Neurontin Seroquel and Percocet.  Restart oxycodone 5 Q6H. SAT today. Appreciate neuro recs.  Pulmonary: Duonebs scheduled. Pred 40 daily last dose tomorrow. Ceftriaxone daily for 5 days. CVS Target MAP greater than 65. C/w home furosemide.  GI start TF. IV PPI daily. Switch to PO diet once extubated.  Renal Monitor urine output.  Avoid nephrotoxic agent. Heme: SCDs for now.  Start heparin [place] dvt prophy.   Best Practice (right click and "Reselect all SmartList Selections" daily)   Diet/type: TF DVT prophylaxis Heparin [place]  Pressure ulcer(s): N/A GI prophylaxis: PPI Lines: N/A Foley:  Yes, and it is still needed Code Status:  full code  Last date of multidisciplinary goals of care discussion [12/08/2023]  Critical Care time: 35 min.   Janann Colonel, MD [facility] 12/08/2023 2:03 PM

## 2023-12-08 NOTE — Progress Notes (Signed)
 Patient extubated per Dr. Magdalene Molly. No issues with extubation. Patient extubated to 2L nasal cannula, oxygen saturation is 95%.

## 2023-12-09 ENCOUNTER — Ambulatory Visit: Payer: Medicare HMO

## 2023-12-09 ENCOUNTER — Inpatient Hospital Stay: Payer: Medicare HMO

## 2023-12-09 DIAGNOSIS — R531 Weakness: Secondary | ICD-10-CM

## 2023-12-09 DIAGNOSIS — J690 Pneumonitis due to inhalation of food and vomit: Secondary | ICD-10-CM | POA: Diagnosis not present

## 2023-12-09 DIAGNOSIS — R569 Unspecified convulsions: Secondary | ICD-10-CM | POA: Diagnosis not present

## 2023-12-09 DIAGNOSIS — N179 Acute kidney failure, unspecified: Secondary | ICD-10-CM

## 2023-12-09 DIAGNOSIS — G894 Chronic pain syndrome: Secondary | ICD-10-CM

## 2023-12-09 DIAGNOSIS — G9341 Metabolic encephalopathy: Secondary | ICD-10-CM | POA: Diagnosis not present

## 2023-12-09 DIAGNOSIS — J9601 Acute respiratory failure with hypoxia: Secondary | ICD-10-CM | POA: Diagnosis not present

## 2023-12-09 DIAGNOSIS — F4310 Post-traumatic stress disorder, unspecified: Secondary | ICD-10-CM

## 2023-12-09 DIAGNOSIS — G514 Facial myokymia: Secondary | ICD-10-CM

## 2023-12-09 LAB — BASIC METABOLIC PANEL
Anion gap: 11 (ref 5–15)
BUN: 20 mg/dL (ref 8–23)
CO2: 25 mmol/L (ref 22–32)
Calcium: 8.6 mg/dL — ABNORMAL LOW (ref 8.9–10.3)
Chloride: 105 mmol/L (ref 98–111)
Creatinine, Ser: 0.51 mg/dL (ref 0.44–1.00)
GFR, Estimated: >60 mL/min (ref >60)
Glucose, Bld: 115 mg/dL — ABNORMAL HIGH (ref 70–99)
Potassium: 3.1 mmol/L — ABNORMAL LOW (ref 3.5–5.1)
Sodium: 141 mmol/L (ref 135–145)

## 2023-12-09 LAB — CBC
HCT: 33.8 % — ABNORMAL LOW (ref 36.0–46.0)
Hemoglobin: 11.2 g/dL — ABNORMAL LOW (ref 12.0–15.0)
MCH: 30.8 pg (ref 26.0–34.0)
MCHC: 33.1 g/dL (ref 30.0–36.0)
MCV: 92.9 fL (ref 80.0–100.0)
Platelets: 170 10*3/uL (ref 150–400)
RBC: 3.64 MIL/uL — ABNORMAL LOW (ref 3.87–5.11)
RDW: 13.9 % (ref 11.5–15.5)
WBC: 12.3 10*3/uL — ABNORMAL HIGH (ref 4.0–10.5)
nRBC: 0 % (ref 0.0–0.2)

## 2023-12-09 LAB — CULTURE, BLOOD (ROUTINE X 2)
Culture: NO GROWTH
Culture: NO GROWTH

## 2023-12-09 LAB — CULTURE, RESPIRATORY W GRAM STAIN
Culture: NO GROWTH
Gram Stain: NONE SEEN

## 2023-12-09 LAB — GLUCOSE, CAPILLARY
Glucose-Capillary: 112 mg/dL — ABNORMAL HIGH (ref 70–99)
Glucose-Capillary: 116 mg/dL — ABNORMAL HIGH (ref 70–99)
Glucose-Capillary: 116 mg/dL — ABNORMAL HIGH (ref 70–99)
Glucose-Capillary: 119 mg/dL — ABNORMAL HIGH (ref 70–99)
Glucose-Capillary: 131 mg/dL — ABNORMAL HIGH (ref 70–99)
Glucose-Capillary: 135 mg/dL — ABNORMAL HIGH (ref 70–99)

## 2023-12-09 MED ORDER — OXYMETAZOLINE HCL 0.05 % NA SOLN
1.0000 | Freq: Two times a day (BID) | NASAL | Status: DC
  Administered 2023-12-09 – 2023-12-11 (×5): 1 via NASAL
  Filled 2023-12-09: qty 15

## 2023-12-09 MED ORDER — ENSURE ENLIVE PO LIQD
237.0000 mL | Freq: Three times a day (TID) | ORAL | Status: DC
  Administered 2023-12-10 – 2024-01-01 (×51): 237 mL via ORAL

## 2023-12-09 MED ORDER — ENSURE ENLIVE PO LIQD
237.0000 mL | Freq: Two times a day (BID) | ORAL | Status: DC
  Administered 2023-12-09 (×2): 237 mL via ORAL

## 2023-12-09 NOTE — Progress Notes (Signed)
 NAME:  Tami Hopkins, MRN:  [id number], DOB:  10/30/1960, LOS: 5 ADMISSION DATE:  12/04/2023 History of Present Illness:  Tami Hopkins is a 63 y.o. female with a PMH significant for HLD, COPD, Asthma, Tobacco use, Chronic pain syndrome, Anxiety, PTSD, IBS, and GERD who presents to the ED for altered mental status.  Per chart review, family had not heard from the patient for the past 2 days and called for a wellness check.  Patient subsequently found unresponsive sitting on the couch, roommate stated that they thought patient had been sleeping on the couch for the past few days.  Of note, patient was admitted to the hospital a couple of months ago for altered mental status due to suspected polypharmacy use.  Upon EMS arrival patient was given 2 mg of intranasal Narcan with no effect, subsequently given 2 mg IV Narcan with slight respiratory improvement, yet remained unresponsive.  Upon arrival to the ER, BP 94/78, afebrile, 100% on room air, she was noted to have vomit in her airway during suctioning. She was intubated for airway protection. Labs revealed K 6.0, Creat 3.72, BUN 62, AST 162, ALT 85, CK 4155, Troponin 91 and 74, lactic acid 1.6, WBC 19.0, Drug screen + benzodiazepine, opiate, tricyclics. She was given a total of 3 liters of IVF, started on broad spectrum antibiotics, cultures sent, and started on Norepinephrine infusion. CT c-spine without noted fracture, CT head without acute pathology, CT A/P/T bilateral atelectasis, stable non obstructing right renal calculi, hepatic steatosis, aortic atherosclerosis, no acute intra abdominal process.     PCCM was consulted for Medical Management in this critically ill patient in septic shock and respiratory failure suspected due to polysubstance abuse and aspiration Pneumonia  Pertinent  Medical History  HLD, Chronic Pain Syndrome, Asthma, Tobacco Use, Anxiety, PTSD   Significant Hospital Events: Including procedures, antibiotic start and stop  dates in addition to other pertinent events   2/12: Admitted with AMS due to polysubstance abuse. Septic shock, levophed, cultures sent, intubated  02/13: Brainstem reflexes improved today with regain of cough, gag, pupils reactive.  02/14: Improved mental status but not yet following commands.  02/16: Extubated to nasal cannula.  With right sided weakness in upper and lower extremity and facial twitches. Febrile to 101. Neurology is consulted.   Interim History / Subjective:  No major events overngiht.  MRI brain 02/13  w/ No acute abnormality.  Alert and awake following commands   Objective   Blood pressure (!) 140/94, pulse 93, temperature 98.4 F (36.9 C), resp. rate 17, height 5\' 8"  (1.727 m), weight 86.2 kg, SpO2 94%.        Intake/Output Summary (Last 24 hours) at 12/09/2023 1501 Last data filed at 12/09/2023 1234 Gross per 24 hour  Intake 1885.15 ml  Output 2141 ml  Net -255.85 ml   Filed Weights   12/07/23 0423 12/08/23 0408 12/09/23 0500  Weight: 80 kg 85 kg 86.2 kg   Subjective  Patient respiratory status improving. Prioceeding with extubation.   Examination: GEN alert and awake    Review of Systems: Gen:  Denies  fever, sweats, chills weight loss   Physical Examination:   General Appearance: No distress  EYES PERRLA, EOM intact.   NECK Supple, No JVD Pulmonary: normal breath sounds, No wheezing.  CardiovascularNormal S1,S2.  No m/r/g.   Abdomen: Benign, Soft, non-tender. Neuro: 0/5 motor on RUE and RLE. 3/5 LUE and LLE. Follows minimal copmmands. Fascial twitches noted.     Assessment &  Plan:  63 year old female patient with past medical history of chronic pain on chronic opioid use presenting with altered mental status and low GCS.  Intubated in the emergency department on 12/04/2023.  Remained with a low GCS.  Thought to be secondary to oxycodone overdose, successfully extubated   NEUROLOGY ACUTE METABOLIC ENCEPHALOPATHY resolving  ICU needs  resolved  Resp failure resolved  TRANSFER TO TRH  Best Practice (right click and "Reselect all SmartList Selections" daily)   Diet/type: TF DVT prophylaxis Heparin [place]  Pressure ulcer(s): N/A GI prophylaxis: PPI Lines: N/A Foley:  Yes, and it is still needed Code Status:  full code  Erin Fulling, MD [facility] 12/09/2023 3:01 PM

## 2023-12-09 NOTE — Procedures (Signed)
 Patient Name: Tami Hopkins  MRN: [id number]  Epilepsy Attending: Charlsie Quest  Referring Physician/Provider: Jefferson Fuel, MD  Date: 12/09/2023 Duration: 26.48 mins  Patient history: 63yo F with ams. EEG to evaluate for seizure  Level of alertness: Awake  AEDs during EEG study: None  Technical aspects: This EEG study was done with scalp electrodes positioned according to the 10-20 International system of electrode placement. Electrical activity was reviewed with band pass filter of 1-70Hz , sensitivity of 7 uV/mm, display speed of 1mm/sec with a 60Hz  notched filter applied as appropriate. EEG data were recorded continuously and digitally stored.  Video monitoring was available and reviewed as appropriate.  Description: The posterior dominant rhythm consists of 8 Hz activity of moderate voltage (25-35 uV) seen predominantly in posterior head regions, symmetric and reactive to eye opening and eye closing. Hyperventilation and photic stimulation were not performed.     IMPRESSION: This study is within normal limits. No seizures or epileptiform discharges were seen throughout the recording.  A normal interictal EEG does not exclude the diagnosis of epilepsy.  Tami Hopkins

## 2023-12-09 NOTE — Consult Note (Signed)
 PHARMACY CONSULT NOTE - ELECTROLYTES  Pharmacy Consult for Electrolyte Monitoring and Replacement   Recent Labs: Height: 5\' 8"  (172.7 cm) Weight: 85 kg (187 lb 6.3 oz) IBW/kg (Calculated) : 63.9 Estimated Creatinine Clearance: 81.1 mL/min (by C-G formula based on SCr of 0.51 mg/dL). Potassium (mmol/L)  Date Value  12/09/2023 3.1 (L)   Magnesium (mg/dL)  Date Value  46/96/2952 1.9   Calcium (mg/dL)  Date Value  84/13/2440 8.6 (L)   Albumin (g/dL)  Date Value  08/18/2535 2.9 (L)   Phosphorus (mg/dL)  Date Value  64/40/3474 2.3 (L)   Sodium (mmol/L)  Date Value  12/09/2023 141    Assessment  Tami Hopkins is a 63 y.o. female presenting with acute hypoxic respiratory failure. PMH significant for HLD, chronic pain, PTSD, and GERD. Pharmacy has been consulted to monitor and replace electrolytes.  MIVF: D5W @ 86mL/hr,  Pertinent medications: furosemide 40 mg daily Diet: Thin diet, may use ONS per protocol   Goal of Therapy: Electrolytes WNL K = 3.1  Mg = NA Phos = NA  Plan:  Medical team scheduled 40 mEq of PO KCl twice daily  No other replacement indicated at this time Monitor daily with AM labs  Thank you for allowing pharmacy to be a part of this patient's care.  Effie Shy, PharmD Pharmacy Resident  12/09/2023 6:28 AM

## 2023-12-09 NOTE — Consult Note (Signed)
 NEUROLOGY CONSULT FOLLOW UP NOTE   Date of service: December 09, 2023 Patient Name: Tami Hopkins MRN:  [id number] DOB:  September 08, 1961  Interval Hx/subjective   This is a 63 year old woman with a history of hyperlipidemia, COPD, asthma, tobacco use, chronic pain syndrome, anxiety, PTSD, IBS, and GERD who presented to the ED for altered mental status.  Family had not heard from the patient in 2 days and called for a wellness check.  Patient was found unresponsive sitting on the couch, roommate stated that patient had been "sleeping on the couch" for the past few days.  Of note patient was admitted to the hospital couple months ago for altered mental status due to suspected polypharmacy.  Upon EMS arrival patient was given Narcan x 2 with slight respiratory improvement although she remained unresponsive.  She was intubated for airway protection upon arrival to the ED.  Drug screen was positive for benzos, opiates, and tricyclics.  Brain MRI on February 13 showed no acute abnormality.  She has persistent right-sided weakness particularly in her right arm where she has no movement. During the exam patient had near continuous twitching of her upper R face / periorbital region. This was not noted to result in mental status change however she has been altered since admission. No motor activity c/f seizure anywhere else in the body.  Patient was extubated today.  ICU also suspects aspiration pneumonia and COPD exacerbation as well as rhabdo.  Vitals   Vitals:   12/08/23 2200 12/08/23 2206 12/08/23 2300 12/09/23 0000  BP:  113/81 125/80 122/80  Pulse: 88 92 96 91  Resp: 16 14 16 15   Temp: 99.3 F (37.4 C) 99.3 F (37.4 C) 99.5 F (37.5 C) 99.5 F (37.5 C)  TempSrc:    Bladder  SpO2: 94% 95% 94% 94%  Weight:      Height:         Body mass index is 28.49 kg/m.  Physical Exam   Gen: patient lying in bed, NAD CV: extremities appear well-perfused Resp: normal WOB  Neurologic Examination    MS: alert, oriented to self, follows some simple commands Speech: moderate dysarthria, moderate aphasia, cannot name or repeat CN: PERRL, blinks to threat bilat, EOMI, sensation intact, face symmetric, hearing intact to voice Motor: No movement RUE. Able to wiggle toes in RLE with minimal movement anti-gravity. Anti-gravity with drift LUE and LLE. During the exam patient had near continuous twitching of her upper R face / periorbital region. This was not noted to result in mental status change however she has been altered since admission. No motor activity c/f seizure anywhere else in the body. Sensory: SILT Coordination: UTA 2/2 AMS Gait: deferred  Medications  Current Facility-Administered Medications:    acetaminophen (TYLENOL) tablet 650 mg, 650 mg, Per Tube, Q6H PRN, Ezequiel Essex, NP, 650 mg at 12/08/23 4098   baclofen (LIORESAL) tablet 10 mg, 10 mg, Oral, BID, Assaker, Jean-Pierre, MD, 10 mg at 12/08/23 2120   cefTRIAXone (ROCEPHIN) 2 g in sodium chloride 0.9 % 100 mL IVPB, 2 g, Intravenous, Q24H, Assaker, Jean-Pierre, MD, Stopped at 12/08/23 1309   Chlorhexidine Gluconate Cloth 2 % PADS 6 each, 6 each, Topical, Daily, Assaker, Jean-Pierre, MD, 6 each at 12/08/23 2120   dextrose 5 % solution, , Intravenous, Continuous, Assaker, Jean-Pierre, MD, Last Rate: 50 mL/hr at 12/09/23 0000, Infusion Verify at 12/09/23 0000   docusate sodium (COLACE) capsule 100 mg, 100 mg, Oral, BID PRN, Assaker, [place], MD   fluticasone Aleda Grana)  50 MCG/ACT nasal spray 1 spray, 1 spray, Each Nare, BID PRN, Dahlia Byes, NP   folic acid (FOLVITE) tablet 1 mg, 1 mg, Oral, Daily, Ezequiel Essex, NP   furosemide (LASIX) tablet 40 mg, 40 mg, Oral, Daily, Assaker, [place], MD, 40 mg at 12/08/23 0941   heparin injection 5,000 Units, 5,000 Units, Subcutaneous, Q8H, Assaker, Jean-Pierre, MD, 5,000 Units at 12/08/23 2126   ipratropium-albuterol (DUONEB) 0.5-2.5 (3) MG/3ML nebulizer solution 3 mL, 3 mL,  Nebulization, TID, Assaker, [place], MD   labetalol (NORMODYNE) injection 10 mg, 10 mg, Intravenous, Q2H PRN, Harlon Ditty D, NP   loperamide (IMODIUM) capsule 2 mg, 2 mg, Oral, PRN, Ezequiel Essex, NP, 2 mg at 12/08/23 2120   methylPREDNISolone sodium succinate (SOLU-MEDROL) 40 mg/mL injection 40 mg, 40 mg, Intravenous, Daily, Assaker, [place], MD   montelukast (SINGULAIR) tablet 10 mg, 10 mg, Oral, QHS, Ezequiel Essex, NP, 10 mg at 12/08/23 2120   multivitamin with minerals tablet 1 tablet, 1 tablet, Oral, Daily, Ezequiel Essex, NP   Oral care mouth rinse, 15 mL, Mouth Rinse, PRN, Assaker, [place], MD   oxyCODONE (Oxy IR/ROXICODONE) immediate release tablet 5 mg, 5 mg, Oral, Q6H PRN, Ezequiel Essex, NP, 5 mg at 12/08/23 2350   pantoprazole (PROTONIX) injection 40 mg, 40 mg, Intravenous, Q24H, Assaker, Jean-Pierre, MD, 40 mg at 12/08/23 1610   polyethylene glycol (MIRALAX / GLYCOLAX) packet 17 g, 17 g, Oral, Daily PRN, Assaker, [place], MD   potassium chloride (KLOR-CON) packet 40 mEq, 40 mEq, Oral, BID, Barrie Folk, RPH, 40 mEq at 12/08/23 2120   thiamine (VITAMIN B1) tablet 100 mg, 100 mg, Oral, Daily **OR** thiamine (VITAMIN B1) injection 100 mg, 100 mg, Intravenous, Daily, Barrie Folk [facility]  Labs and Diagnostic Imaging   CBC:  Recent Labs  Lab 12/04/23 1312 12/05/23 0417 12/07/23 0345 12/08/23 0540  WBC 19.0*   < > 15.5* 11.5*  NEUTROABS 16.3*  --   --   --   HGB 14.4   < > 12.3 10.8*  HCT 47.9*   < > 37.5 33.4*  MCV 104.6*   < > 96.2 95.7  PLT 209   < > 160 158   < > = values in this interval not displayed.    Basic Metabolic Panel:  Lab Results  Component Value Date   NA 152 (H) 12/08/2023   K 3.5 12/08/2023   CO2 26 12/08/2023   GLUCOSE 125 (H) 12/08/2023   BUN 33 (H) 12/08/2023   CREATININE 0.63 12/08/2023   CALCIUM 8.7 (L) 12/08/2023   GFRNONAA >60 12/08/2023   GFRAA >60 05/14/2015   Lipid Panel: No results found for:  "LDLCALC" HgbA1c: No results found for: "HGBA1C" Urine Drug Screen:     Component Value Date/Time   LABOPIA POSITIVE (A) 12/04/2023 1312   COCAINSCRNUR NONE DETECTED 12/04/2023 1312   LABBENZ POSITIVE (A) 12/04/2023 1312   AMPHETMU NONE DETECTED 12/04/2023 1312   THCU NONE DETECTED 12/04/2023 1312   LABBARB NONE DETECTED 12/04/2023 1312    Alcohol Level     Component Value Date/Time   ETH <10 12/04/2023 1343   INR  Lab Results  Component Value Date   INR 1.1 12/04/2023   APTT No results found for: "APTT" AED levels: No results found for: "PHENYTOIN", "ZONISAMIDE", "LAMOTRIGINE", "LEVETIRACETA"  MRI Brain(Personally reviewed): 2/13 Normal  rEEG:  Pending  Assessment   This is a 63 year old woman with a history of hyperlipidemia, COPD, asthma, tobacco use, chronic pain  syndrome, anxiety, PTSD, IBS, and GERD who presented to the ED for unresponsiveness possibly for up to 2 days requiring intubation and admission to the ICU. Etiology favored to be narcotic overdose or polypharmacy. She has persistent right-sided weakness particularly in her right arm where she has no movement. During the exam patient had near continuous twitching of her upper R face / periorbital region. This was not noted to result in mental status change however she has been altered since admission. No motor activity c/f seizure anywhere else in the body.  Brain MRI on February 13 showed no acute abnormality.  Patient was extubated today.  ICU also suspects aspiration pneumonia and COPD exacerbation as well as rhabdo.  Recommendations   - Repeat brain MRI to assess for evolving ischemia - rEEG tomorrow - Given twitching has not spread beyond the periorbital region I do not feel she needs to start AED at this time unless MRI and/or EEG are abnormal - Will continue to follow ______________________________________________________________________   This patient is critically ill and at significant risk of  neurological worsening, death and care requires constant monitoring of vital signs, hemodynamics,respiratory and cardiac monitoring, neurological assessment, discussion with family, other specialists and medical decision making of high complexity. I spent 55 minutes of neurocritical care time  in the care of  this patient. This was time spent independent of any time provided by nurse practitioner or PA.  Bing Neighbors, MD [facility] [phone]  If 7pm- 7am, please page neurology on call as listed in AMION.

## 2023-12-09 NOTE — Progress Notes (Signed)
 Eeg done

## 2023-12-09 NOTE — Progress Notes (Signed)
 Nutrition Follow Up Note   DOCUMENTATION CODES:   Not applicable  INTERVENTION:   Recommend NGT placement and nutrition support if no improvement in oral intake within the next 24 hrs  If tube feeds initiated, recommend:  Osmolite 1.5@60ml /hr- Initiate at 41ml/hr and increase by 26ml/hr q 8 hours until goal rate is reached.   ProSource TF 20- Give 60ml daily via tube, each supplement provides 80kcal and 20g of protein.   Free water flushes 30ml q4 hours to maintain tube patency   Regimen provides 2240kcal/day, 110g/day protein and 1277ml/day of free water.   Pt remains at high refeed risk; recommend monitor potassium, magnesium and phosphorus labs daily until stable  Daily weights  Ensure Enlive po TID, each supplement provides 350 kcal and 20 grams of protein.  MVI po daily   NUTRITION DIAGNOSIS:   Inadequate oral intake related to inability to eat (pt sedated and ventilated) as evidenced by NPO status. -ongoing   GOAL:   Patient will meet greater than or equal to 90% of their needs -not met   MONITOR:   PO intake, Supplement acceptance, Labs, Weight trends, I & O's, Skin  ASSESSMENT:   63 y.o. female with h/o HLD, COPD, asthma, tobacco use, DDD. chronic pain syndrome, anxiety, PTSD, IBS and GERD who is admitted with polypharmacy, OD, rhabdomyolysis, aspiration PNA and AKI.  Pt extubated 2/16. Visited pt's room today; pt is lethargic. Pt initiated on a full liquid diet yesterday but has been too lethargic to eat today. Recommend NGT placement and nutrition support if pt remains lethargic and is unable to take oral intake within the next 24 hrs. Pt remains at refeed risk. Per chart, pt is up ~10lbs since admission.    Medications reviewed and include: folic acid, lasix, heparin, MVI, protonix, thiamine  Labs reviewed: K 3.1(L) P 2.3(L), Mg 1.9 wnl- 2/15 Wbc- 12.3(H) Cbgs- 135, 116, 119, 112 x 24 hrs   Diet Order:   Diet Order             Diet full liquid  Room service appropriate? Yes; Fluid consistency: Thin  Diet effective now                  EDUCATION NEEDS:   No education needs have been identified at this time  Skin:  Skin Assessment: Reviewed RN Assessment  Last BM:  2/17- via rectal tube  Height:   Ht Readings from Last 1 Encounters:  12/04/23 5\' 8"  (1.727 m)    Weight:   Wt Readings from Last 1 Encounters:  12/09/23 86.2 kg    Ideal Body Weight:  70 kg  BMI:  Body mass index is 28.89 kg/m.  Estimated Nutritional Needs:   Kcal:  1900-2200kcal/day  Protein:  95-110g/day  Fluid:  1.9-2.2L/day  Betsey Holiday MS, RD, LDN If unable to be reached, please send secure chat to "RD inpatient" available from 8:00a-4:00p daily

## 2023-12-09 NOTE — Plan of Care (Signed)
 EEG is normal.   MRI brain is stable with no infarct or other explanation for symptoms.   Will re-examine tomorrow.   Electronically signed: Dr. Caryl Pina

## 2023-12-10 ENCOUNTER — Inpatient Hospital Stay: Payer: Medicare HMO

## 2023-12-10 DIAGNOSIS — M6281 Muscle weakness (generalized): Secondary | ICD-10-CM

## 2023-12-10 DIAGNOSIS — J9601 Acute respiratory failure with hypoxia: Secondary | ICD-10-CM | POA: Diagnosis not present

## 2023-12-10 LAB — BASIC METABOLIC PANEL
Anion gap: 12 (ref 5–15)
BUN: 21 mg/dL (ref 8–23)
CO2: 24 mmol/L (ref 22–32)
Calcium: 9 mg/dL (ref 8.9–10.3)
Chloride: 100 mmol/L (ref 98–111)
Creatinine, Ser: 0.6 mg/dL (ref 0.44–1.00)
GFR, Estimated: >60 mL/min (ref >60)
Glucose, Bld: 117 mg/dL — ABNORMAL HIGH (ref 70–99)
Potassium: 3.1 mmol/L — ABNORMAL LOW (ref 3.5–5.1)
Sodium: 136 mmol/L (ref 135–145)

## 2023-12-10 LAB — CBC
HCT: 37.1 % (ref 36.0–46.0)
Hemoglobin: 12.7 g/dL (ref 12.0–15.0)
MCH: 31.1 pg (ref 26.0–34.0)
MCHC: 34.2 g/dL (ref 30.0–36.0)
MCV: 90.7 fL (ref 80.0–100.0)
Platelets: 206 10*3/uL (ref 150–400)
RBC: 4.09 MIL/uL (ref 3.87–5.11)
RDW: 13.4 % (ref 11.5–15.5)
WBC: 10.8 10*3/uL — ABNORMAL HIGH (ref 4.0–10.5)
nRBC: 0.2 % (ref 0.0–0.2)

## 2023-12-10 LAB — GLUCOSE, CAPILLARY
Glucose-Capillary: 117 mg/dL — ABNORMAL HIGH (ref 70–99)
Glucose-Capillary: 108 mg/dL — ABNORMAL HIGH (ref 70–99)

## 2023-12-10 LAB — MAGNESIUM: Magnesium: 2 mg/dL (ref 1.7–2.4)

## 2023-12-10 LAB — PHOSPHORUS: Phosphorus: 2.8 mg/dL (ref 2.5–4.6)

## 2023-12-10 MED ORDER — ATORVASTATIN CALCIUM 20 MG PO TABS
40.0000 mg | ORAL_TABLET | Freq: Every day | ORAL | Status: DC
  Administered 2023-12-10 – 2023-12-13 (×4): 40 mg via ORAL
  Filled 2023-12-10 (×4): qty 2

## 2023-12-10 MED ORDER — CLONAZEPAM 0.5 MG PO TABS
0.5000 mg | ORAL_TABLET | Freq: Two times a day (BID) | ORAL | Status: DC | PRN
  Administered 2023-12-11: 0.5 mg via ORAL
  Filled 2023-12-10: qty 1

## 2023-12-10 MED ORDER — POTASSIUM CHLORIDE CRYS ER 20 MEQ PO TBCR: 40.0000 meq | EXTENDED_RELEASE_TABLET | Freq: Once | ORAL | Status: DC

## 2023-12-10 MED ORDER — ENOXAPARIN SODIUM 40 MG/0.4ML IJ SOSY
40.0000 mg | PREFILLED_SYRINGE | INTRAMUSCULAR | Status: DC
  Administered 2023-12-11 – 2024-01-02 (×23): 40 mg via SUBCUTANEOUS
  Filled 2023-12-10 (×23): qty 0.4

## 2023-12-10 MED ORDER — BUSPIRONE HCL 10 MG PO TABS
10.0000 mg | ORAL_TABLET | Freq: Three times a day (TID) | ORAL | Status: DC
  Administered 2023-12-10 – 2024-01-02 (×68): 10 mg via ORAL
  Filled 2023-12-10 (×70): qty 1

## 2023-12-10 MED ORDER — OXYCODONE-ACETAMINOPHEN 5-325 MG PO TABS: 1.0000 | ORAL_TABLET | Freq: Four times a day (QID) | ORAL | Status: DC | PRN

## 2023-12-10 MED ORDER — GADOBUTROL 1 MMOL/ML IV SOLN
10.0000 mL | Freq: Once | INTRAVENOUS | Status: AC | PRN
  Administered 2023-12-10: 10 mL via INTRAVENOUS

## 2023-12-10 MED ORDER — BACLOFEN 10 MG PO TABS
5.0000 mg | ORAL_TABLET | Freq: Two times a day (BID) | ORAL | Status: DC
  Administered 2023-12-10 – 2024-01-02 (×44): 5 mg via ORAL
  Filled 2023-12-10 (×46): qty 1

## 2023-12-10 MED ORDER — POTASSIUM CHLORIDE 20 MEQ PO PACK
40.0000 meq | PACK | Freq: Two times a day (BID) | ORAL | Status: AC
  Administered 2023-12-10: 40 meq via ORAL
  Filled 2023-12-10: qty 2

## 2023-12-10 MED ORDER — GABAPENTIN 300 MG PO CAPS
300.0000 mg | ORAL_CAPSULE | Freq: Four times a day (QID) | ORAL | Status: DC
  Administered 2023-12-10 – 2024-01-02 (×91): 300 mg via ORAL
  Filled 2023-12-10 (×92): qty 1

## 2023-12-10 NOTE — Evaluation (Signed)
 Physical Therapy Evaluation Patient Details Name: Tami Hopkins MRN: [id number] DOB: 11-Apr-1961 Today's Date: 12/10/2023  History of Present Illness  Pt is a 63 y/o female presented to ED for unresponsiveness. Admitted for drug overdose and aspiration pneumonia. PMH: chronic pain syndrome, PTSD, COPD.  Clinical Impression  Patient alert, oriented to self, very self limiting due to pain and fear of moving. Per pt, she was using a walker prior to admission, did not provide much more PLOF, needs further investigation. Session focused on functional bed mobility due to BM noted, two person assist for safety but pt able to roll with modA and assistance for step by step sequencing to maximize participation. Pt adamantly refused further mobility.  Overall the patient demonstrated deficits (see "PT Problem List") that impede the patient's functional abilities, safety, and mobility and would benefit from skilled PT intervention.          If plan is discharge home, recommend the following: Two people to help with walking and/or transfers;Two people to help with bathing/dressing/bathroom;Help with stairs or ramp for entrance;Assist for transportation;Assistance with feeding;Assistance with cooking/housework   Can travel by private vehicle   No    Equipment Recommendations Other (comment) (TBD at next venue of care)  Recommendations for Other Services       Functional Status Assessment Patient has had a recent decline in their functional status and demonstrates the ability to make significant improvements in function in a reasonable and predictable amount of time.     Precautions / Restrictions Precautions Precautions: Fall Recall of Precautions/Restrictions: Intact Restrictions Weight Bearing Restrictions Per Provider Order: No      Mobility  Bed Mobility Overal bed mobility: Needs Assistance Bed Mobility: Rolling Rolling: Max assist, +2 for safety/equipment, Used rails          General bed mobility comments: pt needed step by step sequencing for technique to maximize participation    Transfers                   General transfer comment: pt adamantly declined due to elevated pain    Ambulation/Gait                  Stairs            Wheelchair Mobility     Tilt Bed    Modified Rankin (Stroke Patients Only)       Balance                                             Pertinent Vitals/Pain Pain Assessment Pain Assessment: Faces Faces Pain Scale: Hurts little more Pain Location: generalized Pain Descriptors / Indicators: Aching, Grimacing, Guarding Pain Intervention(s): Limited activity within patient's tolerance, Monitored during session, Repositioned, Patient requesting pain meds-RN notified    Home Living Family/patient expects to be discharged to:: Private residence Living Arrangements: Non-relatives/Friends                 Additional Comments: Pt did not provide much PLOF, needs follow up questioning    Prior Function               Mobility Comments: reported using a walker prior to admission       Extremity/Trunk Assessment   Upper Extremity Assessment Upper Extremity Assessment: Generalized weakness (RUE weaker than LUE amd more painful)    Lower Extremity Assessment Lower  Extremity Assessment:  (pt unable to lift BLE without assistance due to pt, questionable  effort)       Communication        Cognition Arousal: Alert Behavior During Therapy: WFL for tasks assessed/performed   PT - Cognitive impairments: No apparent impairments                                 Cueing       General Comments      Exercises     Assessment/Plan    PT Assessment Patient needs continued PT services  PT Problem List Decreased strength;Pain;Decreased range of motion;Decreased activity tolerance;Decreased balance;Decreased mobility;Decreased knowledge of  precautions;Decreased knowledge of use of DME       PT Treatment Interventions DME instruction;Balance training;Gait training;Neuromuscular re-education;Stair training;Functional mobility training;Patient/family education;Therapeutic activities;Therapeutic exercise    PT Goals (Current goals can be found in the Care Plan section)  Acute Rehab PT Goals Patient Stated Goal: to feel better PT Goal Formulation: With patient Time For Goal Achievement: 12/24/23 Potential to Achieve Goals: Fair    Frequency Min 1X/week     Co-evaluation               AM-PAC PT "6 Clicks" Mobility  Outcome Measure Help needed turning from your back to your side while in a flat bed without using bedrails?: A Lot Help needed moving from lying on your back to sitting on the side of a flat bed without using bedrails?: A Lot Help needed moving to and from a bed to a chair (including a wheelchair)?: Total Help needed standing up from a chair using your arms (e.g., wheelchair or bedside chair)?: Total Help needed to walk in hospital room?: Total Help needed climbing 3-5 steps with a railing? : Total 6 Click Score: 8    End of Session   Activity Tolerance: Patient limited by fatigue;Patient limited by pain Patient left: in bed;with call bell/phone within reach;with bed alarm set;with nursing/sitter in room Nurse Communication: Mobility status PT Visit Diagnosis: Other abnormalities of gait and mobility (R26.89);Difficulty in walking, not elsewhere classified (R26.2);Muscle weakness (generalized) (M62.81)    Time: 8295-6213 PT Time Calculation (min) (ACUTE ONLY): 34 min   Charges:   PT Evaluation $PT Eval Low Complexity: 1 Low PT Treatments $Therapeutic Activity: 23-37 mins PT General Charges $$ ACUTE PT VISIT: 1 Visit         Olga Coaster PT, DPT 11:03 AM,12/10/23

## 2023-12-10 NOTE — NC FL2 (Signed)
 [place] MEDICAID FL2 LEVEL OF CARE FORM     IDENTIFICATION  Patient Name: Tami Hopkins Birthdate: January 16, 1961 Sex: female Admission Date (Current Location): 12/04/2023  [facility] and [place] Number:  Chiropodist and Address:  [facility], [address] [place], [place] [address]      Provider Number: [id number]  Attending Physician Name and Address:  Kathrynn Running, MD  Relative Name and Phone Number:  Rogers,Christie (Sister)  [phone]    Current Level of Care: Hospital Recommended Level of Care: Skilled Nursing Facility Prior Approval Number:    Date Approved/Denied:   PASRR Number: pending  Discharge Plan: SNF    Current Diagnoses: Patient Active Problem List   Diagnosis Date Noted   Acute respiratory failure (HCC) 12/05/2023   Acute hypoxic respiratory failure (HCC) 12/04/2023   Radiculopathy due to lumbar intervertebral disc disorder 10/13/2023   Other idiopathic scoliosis, thoracolumbar region 10/13/2023   Foraminal stenosis of lumbar region 10/13/2023   Drug overdose 10/07/2023   Acute respiratory failure with hypoxia (HCC) 10/07/2023   Aspiration pneumonia (HCC) 10/07/2023   AKI (acute kidney injury) (HCC) 10/07/2023   Pain in joint, shoulder region 12/28/2021   Unspecified inflammatory spondylopathy, lumbosacral region (HCC) 01/19/2019   DDD (degenerative disc disease), lumbar 11/26/2018   Cervicalgia 11/26/2018   Bad taste in mouth 06/26/2018   Chronic pain syndrome 01/29/2018   Chronic nausea 11/19/2017   Gastroesophageal reflux disease with esophagitis 05/17/2017   Trigger finger of right hand 02/08/2017   Irritable bowel syndrome with both constipation and diarrhea 11/19/2016   Chronic allergic rhinitis due to animal hair and dander 10/08/2016   Chronic hip pain, right 10/08/2016   Chronic right-sided low back pain with right-sided sciatica 10/08/2016   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   High  cholesterol 10/08/2016   Moderate persistent asthma without complication 10/08/2016   Overweight (BMI 25.0-29.9) 10/08/2016   Tobacco use disorder 10/08/2016   Neurosis, posttraumatic 02/28/2016   Endometriosis 08/18/2015   Chronic obstructive pulmonary disease (HCC) 07/18/2015   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) 07/18/2015   Post-traumatic stress disorder 07/18/2015   Delirium, drug-induced 05/12/2015   Posttraumatic stress disorder 05/12/2015   Rhabdomyolysis 05/10/2015   Encephalopathy acute 05/10/2015    Orientation RESPIRATION BLADDER Height & Weight     Time, Situation  Normal Incontinent Weight: 199 lb 8.3 oz (90.5 kg) Height:  5\' 8"  (172.7 cm)  BEHAVIORAL SYMPTOMS/MOOD NEUROLOGICAL BOWEL NUTRITION STATUS      Incontinent    AMBULATORY STATUS COMMUNICATION OF NEEDS Skin   Extensive Assist Verbally Normal                       Personal Care Assistance Level of Assistance  Bathing, Dressing, Feeding Bathing Assistance: Maximum assistance Feeding assistance: Maximum assistance Dressing Assistance: Maximum assistance     Functional Limitations Info  Sight, Hearing, Speech Sight Info: Adequate Hearing Info: Adequate Speech Info: Adequate    SPECIAL CARE FACTORS FREQUENCY  PT (By licensed PT), OT (By licensed OT)     PT Frequency: 5 times a week OT Frequency: 5 times a week            Contractures Contractures Info: Not present    Additional Factors Info  Code Status, Allergies Code Status Info: FULL Allergies Info: Penicillins, Sulfa Antibiotics, Amitriptyline, Bupropion, Doxepin, Moxifloxacin           Current Medications (12/10/2023):  This is the current hospital active medication  list Current Facility-Administered Medications  Medication Dose Route Frequency Provider Last Rate Last Admin   acetaminophen (TYLENOL) tablet 650 mg  650 mg Per Tube Q6H PRN Ezequiel Essex, NP   650 mg at 12/08/23 0552   atorvastatin (LIPITOR) tablet 40  mg  40 mg Oral Daily Kathrynn Running, MD   40 mg at 12/10/23 1240   baclofen (LIORESAL) tablet 5 mg  5 mg Oral BID Wouk, Wilfred Curtis, MD       busPIRone (BUSPAR) tablet 10 mg  10 mg Oral TID Kathrynn Running, MD       clonazePAM Scarlette Calico) tablet 0.5 mg  0.5 mg Oral BID PRN Kathrynn Running, MD       Melene Muller ON 12/11/2023] enoxaparin (LOVENOX) injection 40 mg  40 mg Subcutaneous Q24H Wouk, Wilfred Curtis, MD       feeding supplement (ENSURE ENLIVE / ENSURE PLUS) liquid 237 mL  237 mL Oral TID BM Erin Fulling, MD   237 mL at 12/10/23 1241   fluticasone (FLONASE) 50 MCG/ACT nasal spray 1 spray  1 spray Each Nare BID PRN Dahlia Byes, NP       folic acid (FOLVITE) tablet 1 mg  1 mg Oral Daily Ezequiel Essex, NP   1 mg at 12/10/23 1610   gabapentin (NEURONTIN) capsule 300 mg  300 mg Oral QID Kathrynn Running, MD   300 mg at 12/10/23 1241   ipratropium-albuterol (DUONEB) 0.5-2.5 (3) MG/3ML nebulizer solution 3 mL  3 mL Nebulization TID Janann Colonel, MD   3 mL at 12/10/23 0808   montelukast (SINGULAIR) tablet 10 mg  10 mg Oral QHS Ezequiel Essex, NP   10 mg at 12/09/23 2112   multivitamin with minerals tablet 1 tablet  1 tablet Oral Daily Ezequiel Essex, NP   1 tablet at 12/10/23 9604   Oral care mouth rinse  15 mL Mouth Rinse PRN Assaker, West Bali, MD       oxyCODONE (Oxy IR/ROXICODONE) immediate release tablet 5 mg  5 mg Oral Q6H PRN Ezequiel Essex, NP   5 mg at 12/10/23 0540   oxymetazoline (AFRIN) 0.05 % nasal spray 1 spray  1 spray Each Nare BID Erin Fulling, MD   1 spray at 12/10/23 0904   pantoprazole (PROTONIX) injection 40 mg  40 mg Intravenous Q24H Assaker, [place] Bali, MD   40 mg at 12/10/23 0905   potassium chloride (KLOR-CON) packet 40 mEq  40 mEq Oral BID Wouk, Wilfred Curtis, MD       thiamine (VITAMIN B1) tablet 100 mg  100 mg Oral Daily Barrie Folk, RPH   100 mg at 12/10/23 5409   Or   thiamine (VITAMIN B1) injection 100 mg  100 mg Intravenous Daily Barrie Folk, [facility]         Discharge Medications: Please see discharge summary for a list of discharge medications.  Relevant Imaging Results:  Relevant Lab Results:   Additional Information [id number]  Allena Katz, LCSW

## 2023-12-10 NOTE — Care Management Important Message (Signed)
 Important Message  Patient Details  Name: Tami Hopkins MRN: [id number] Date of Birth: 10/18/1961   Important Message Given:  Yes - Medicare IM     Cristela Blue, CMA 12/10/2023, 10:26 AM

## 2023-12-10 NOTE — Progress Notes (Signed)
 NEUROLOGY CONSULT FOLLOW UP NOTE   Date of service: December 10, 2023 Patient Name: Tami Hopkins MRN:  [id number] DOB:  09/25/1961  Brief Review of HPI  63 year old female with a history of hyperlipidemia, COPD, asthma, tobacco use, chronic pain syndrome, anxiety, PTSD, IBS, and GERD who presented to the ED for altered mental status.  Family had not heard from the patient in 2 days and called for a wellness check.  Patient was found unresponsive sitting on the couch, roommate stated that patient had been "sleeping on the couch" for the past few days.  Of note patient was admitted to the hospital a couple months ago for altered mental status due to suspected polypharmacy.  Upon EMS arrival patient was given Narcan x 2 with slight respiratory improvement although she remained unresponsive.  She was intubated for airway protection upon arrival to the ED.  Drug screen was positive for benzos, opiates, and tricyclics.  Brain MRI on February 13 showed no acute abnormality.  She has persistent right-sided weakness particularly in her right arm where she has no movement. During the Neurology consultant's exam on Sunday, the patient had near continuous twitching of her upper R face / periorbital region. This was not noted to result in mental status change in the context of her having been altered since admission. No motor activity c/f seizure anywhere else in the body.  Patient was extubated on Sunday.  ICU also suspected aspiration pneumonia and COPD exacerbation as well as rhabdo. She has been transferred out of the ICU and is now on the medical floor.   Vitals   Vitals:   12/10/23 0500 12/10/23 0808 12/10/23 0837 12/10/23 1555  BP:   (!) 145/91 110/82  Pulse:   95 92  Resp:   17 17  Temp:   98.2 F (36.8 C) 98.5 F (36.9 C)  TempSrc:    Oral  SpO2:  92% 95% 96%  Weight: 90.5 kg     Height:         Body mass index is 30.34 kg/m.  Physical Exam   Physical Exam HEENT- [place]/AT   Lungs-  Respirations unlabored Extremities- Warm and well-perfused. Edema of RUE is present, proximally and distally.   Neurological Examination Mental Status: Awake and alert. Speech is slow but clear and fluent with intact comprehension and naming. Fully oriented except for the year and the day of the week. Dysthymic, subdued affect. No agitation noted.  Cranial Nerves: II: Temporal visual fields intact with no extinction to DSS. PERRL. III,IV, VI: No ptosis. EOMI. No nystagmus. V: Temp sensation equal bilaterally VII: Smile symmetric VIII: Hearing intact to voice IX,X: Mild hypophonia (recently extubated) XI: Symmetric SCM's XII: Midline tongue extension Motor: RUE with severe weakness throughout as well as decreased tone: Shoulder elevation, abduction and adduction 0/5 Triceps 2/5 Biceps 1/5 Wrist flexion 4-/5 Wrist extension 3/5 Finger abduction and extension: 2-3/5 Grip: 4/5 LUE: 4+/5 proximally and distally RLE: 2/5 hip flexion, 4-/5 knee extension, ADF and APF LLE: 3/5 hip flexion, 4+/5 knee extension, ADF and APF Sensory:  Dysesthesia and allodynia to temp and FT distal RUE and right hand Normal temp and FT to LUE RLE with mild dysesthesia and allodynia to foot LLE normal sensation.  No extinction to DSS Deep Tendon Reflexes: 2+ and symmetric bilateral patellae as well as left brachioradialis, biceps and triceps RUE with depressed reflexes: 0 biceps and brachioradialis, 1+ triceps Plantars: Right: downgoingLeft: downgoing Cerebellar: No ataxia with FNF on the left. Unable to  perform on the right Gait: Deferred  Medications  Current Facility-Administered Medications:    acetaminophen (TYLENOL) tablet 650 mg, 650 mg, Per Tube, Q6H PRN, Ezequiel Essex, NP, 650 mg at 12/08/23 0552   atorvastatin (LIPITOR) tablet 40 mg, 40 mg, Oral, Daily, Wouk, Wilfred Curtis, MD, 40 mg at 12/10/23 1240   baclofen (LIORESAL) tablet 5 mg, 5 mg, Oral, BID, Wouk, Wilfred Curtis, MD   busPIRone  (BUSPAR) tablet 10 mg, 10 mg, Oral, TID, Wouk, Wilfred Curtis, MD   clonazePAM [facility]) tablet 0.5 mg, 0.5 mg, Oral, BID PRN, Ashok Pall, Wilfred Curtis, MD   Melene Muller ON 12/11/2023] enoxaparin (LOVENOX) injection 40 mg, 40 mg, Subcutaneous, Q24H, Wouk, Wilfred Curtis, MD   feeding supplement (ENSURE ENLIVE / ENSURE PLUS) liquid 237 mL, 237 mL, Oral, TID BM, Belia Heman, Kurian, MD, 237 mL at 12/10/23 1241   fluticasone (FLONASE) 50 MCG/ACT nasal spray 1 spray, 1 spray, Each Nare, BID PRN, Dahlia Byes, NP   folic acid (FOLVITE) tablet 1 mg, 1 mg, Oral, Daily, Ezequiel Essex, NP, 1 mg at 12/10/23 0859   gabapentin (NEURONTIN) capsule 300 mg, 300 mg, Oral, QID, Wouk, Wilfred Curtis, MD, 300 mg at 12/10/23 1241   ipratropium-albuterol (DUONEB) 0.5-2.5 (3) MG/3ML nebulizer solution 3 mL, 3 mL, Nebulization, TID, Assaker, [place] Bali, MD, 3 mL at 12/10/23 0808   montelukast (SINGULAIR) tablet 10 mg, 10 mg, Oral, QHS, Ezequiel Essex, NP, 10 mg at 12/09/23 2112   multivitamin with minerals tablet 1 tablet, 1 tablet, Oral, Daily, Ezequiel Essex, NP, 1 tablet at 12/10/23 1610   Oral care mouth rinse, 15 mL, Mouth Rinse, PRN, Assaker, [place] Bali, MD   oxyCODONE (Oxy IR/ROXICODONE) immediate release tablet 5 mg, 5 mg, Oral, Q6H PRN, Ezequiel Essex, NP, 5 mg at 12/10/23 0540   oxymetazoline (AFRIN) 0.05 % nasal spray 1 spray, 1 spray, Each Nare, BID, Erin Fulling, MD, 1 spray at 12/10/23 0904   pantoprazole (PROTONIX) injection 40 mg, 40 mg, Intravenous, Q24H, Assaker, Jean-Pierre, MD, 40 mg at 12/10/23 0905   potassium chloride (KLOR-CON) packet 40 mEq, 40 mEq, Oral, BID, Ashok Pall, Wilfred Curtis, MD   thiamine (VITAMIN B1) tablet 100 mg, 100 mg, Oral, Daily, 100 mg at 12/10/23 0859 **OR** thiamine (VITAMIN B1) injection 100 mg, 100 mg, Intravenous, Daily, [facility]  Labs and Diagnostic Imaging   CBC:  Recent Labs  Lab 12/04/23 1312 12/05/23 0417 12/09/23 0449 12/10/23 0648  WBC 19.0*   < > 12.3* 10.8*   NEUTROABS 16.3*  --   --   --   HGB 14.4   < > 11.2* 12.7  HCT 47.9*   < > 33.8* 37.1  MCV 104.6*   < > 92.9 90.7  PLT 209   < > 170 206   < > = values in this interval not displayed.    Basic Metabolic Panel:  Lab Results  Component Value Date   NA 136 12/10/2023   K 3.1 (L) 12/10/2023   CO2 24 12/10/2023   GLUCOSE 117 (H) 12/10/2023   BUN 21 12/10/2023   CREATININE 0.60 12/10/2023   CALCIUM 9.0 12/10/2023   GFRNONAA >60 12/10/2023   GFRAA >60 05/14/2015   Lipid Panel: No results found for: "LDLCALC" HgbA1c: No results found for: "HGBA1C" Urine Drug Screen:     Component Value Date/Time   LABOPIA POSITIVE (A) 12/04/2023 1312   COCAINSCRNUR NONE DETECTED 12/04/2023 1312   LABBENZ POSITIVE (A) 12/04/2023 1312   AMPHETMU NONE DETECTED 12/04/2023 1312  THCU NONE DETECTED 12/04/2023 1312   LABBARB NONE DETECTED 12/04/2023 1312    Alcohol Level     Component Value Date/Time   ETH <10 12/04/2023 1343   INR  Lab Results  Component Value Date   INR 1.1 12/04/2023    Assessment  This is a 63 year old woman with a history of hyperlipidemia, COPD, asthma, tobacco use, chronic pain syndrome, anxiety, PTSD, IBS, and GERD who presented to the ED for unresponsiveness after being found down at home, possibly for up to 2 days, requiring intubation and admission to the ICU. Etiology favored to be narcotic overdose or polypharmacy. She had persistent right-sided weakness on initial neurology exam Sunday, particularly in her right arm where she had no movement. During the exam on Sunday the patient had near continuous twitching of her upper R face / periorbital region; this was not noted to result in mental status change, however she had been altered since admission. No motor activity c/f seizure was seen anywhere else in the body on Sunday's exam.  Brain MRI on February 13 showed no acute abnormality.  Patient was extubated on Sunday.  ICU suspected aspiration pneumonia and COPD  exacerbation as well as rhabdomyolysis. - EEG is normal. No indication for an anticonvulsant at this time.  - Repeat MRI brain is stable with no infarct or other explanation for symptoms - Impression:  - Presentation for unresponsiveness: Most likely secondary to narcotic overdose or polypharmacy. Diarrhea here may be a manifestation of opioid withdrawal - she may have had a significantly higher daily opioid intake at home compared to her opioid regimen during this admission. - RUE paralysis, now improving. Based on exam with worse weakness in the proximal myotomes of her arm than distally, suggests possible compression neuropathy or rhabdomyolysis involving selectively the RUE given the swelling seen on exam. The dysesthesia/allodynia and numbness of her RUE points more to nerve injury than rhabdo, however. No evidence for stroke on serial MRIs.  - Elevated CK levels, trending downwards. Rhabdomyolysis due to prolonged immobility is high on the DDx rather than an inflammatory myopathy given improvement in CKs during this admission. Of note, she also had elevated CK levels during her admission in December. A possible statin myopathy may be a contributing factor.   Recommendations  - Discontinue her atorvastatin for now. Can consider restarting once she regains full strength in her RUE and BLE and CKs are normalized. If restarting her statin, she should have her CKs regularly checked as an outpatient.  - PT/OT. OOB to chair daily if possible - Minimize sedating medications - Observe for possible opioid withdrawal symptoms - No indication for an anticonvulsant at this time.  - MRI of cervical spine and right brachial plexus w/wo contrast (ordered) - Continue to monitor CKs - Correction of her hypokalemia - Agree with thiamine supplementation - B12 level (ordered) ______________________________________________________________________   Dessa Phi, Cali Cuartas, MD [facility]

## 2023-12-10 NOTE — Plan of Care (Signed)

## 2023-12-10 NOTE — Progress Notes (Addendum)
 PROGRESS NOTE    Tami Hopkins  WUX:[id number] DOB: 01-23-1961 DOA: 12/04/2023 PCP: Ardyth Man, PA-C  Outpatient Specialists: pain medicine    Brief Narrative:   Tami Hopkins is a 64 y.o. female with a PMH significant for HLD, COPD, Asthma, Tobacco use, Chronic pain syndrome, Anxiety, PTSD, IBS, and GERD who presents to the ED for altered mental status.  Per chart review, family had not heard from the patient for the past 2 days and called for a wellness check.  Patient subsequently found unresponsive sitting on the couch, roommate stated that they thought patient had been sleeping on the couch for the past few days.  Of note, patient was admitted to the hospital a couple of months ago for altered mental status due to suspected polypharmacy use.  Upon EMS arrival patient was given 2 mg of intranasal Narcan with no effect, subsequently given 2 mg IV Narcan with slight respiratory improvement, yet remained unresponsive.  Upon arrival to the ER, BP 94/78, afebrile, 100% on room air, she was noted to have vomit in her airway during suctioning. She was intubated for airway protection. Labs revealed K 6.0, Creat 3.72, BUN 62, AST 162, ALT 85, CK 4155, Troponin 91 and 74, lactic acid 1.6, WBC 19.0, Drug screen + benzodiazepine, opiate, tricyclics. She was given a total of 3 liters of IVF, started on broad spectrum antibiotics, cultures sent, and started on Norepinephrine infusion. CT c-spine without noted fracture, CT head without acute pathology, CT A/P/T bilateral atelectasis, stable non obstructing right renal calculi, hepatic steatosis, aortic atherosclerosis, no acute intra abdominal process.     PCCM was consulted for Medical Management in this critically ill patient in septic shock and respiratory failure suspected due to polysubstance abuse and aspiration Pneumonia  2/12: Admitted with AMS due to polysubstance abuse. Septic shock, levophed, cultures sent, intubated  02/13: Brainstem  reflexes improved today with regain of cough, gag, pupils reactive.  02/14: Improved mental status but not yet following commands.  02/16: Extubated to nasal cannula.  With right sided weakness in upper and lower extremity and facial twitches. Febrile to 101. Neurology is consulted.  2/18: [facility] assumed care  Assessment & Plan:   Principal Problem:   Acute hypoxic respiratory failure (HCC) Active Problems:   Rhabdomyolysis   Aspiration pneumonia (HCC)   AKI (acute kidney injury) (HCC)   Chronic obstructive pulmonary disease (HCC)   Chronic pain syndrome   Post-traumatic stress disorder   Gastroesophageal reflux disease with esophagitis   High cholesterol   Moderate persistent asthma without complication   Tobacco use disorder   Acute respiratory failure (HCC)  # Unintentional opioid overdose # Opioid use disorder # Chronic pain Denies intentional overdose, denies SI - will need close outpatient f/u with pain medicine and plan to wean off opioids vs suboxone/subutex or other OUD treatment - continue home prn oxy for now - cont home gabapentin, baclofen (baclofen at reduced dose)  # GAD - cont home klonipin prn but at reduced dose, as above strongly advise outpatient taper with eventual discontinuation - cont home buspar  # Hypokalemia - replete and monitor  # AKI Prerenal 2/2 overdose, resolved  # Loose stool Noted today - will d/c stool softeners and monitor  # Aspiration pneumonia # Acute hypoxic respiratory failure Extubated and now breathing comfortably on room air, most recent cxr no infiltrates. Finished a course of IV abx in the ICU. MRI nothing acute, EEG no seizure - monitor  # Debility PT  advising SNF, TOC is aware and pursuing a bed   DVT prophylaxis: lovenox Code Status: full Family Communication: sister updated @ bedside 2/18  Level of care: Med-Surg Status is: Inpatient Remains inpatient appropriate because: pending snf  placement    Consultants:  neurology  Procedures: intubation  Antimicrobials:  S/p ceftriaxone    Subjective: Reports stable chronic pain, no dyspnea  Objective: Vitals:   12/09/23 2318 12/10/23 0500 12/10/23 0808 12/10/23 0837  BP: (!) 127/90   (!) 145/91  Pulse: 94   95  Resp: 17   17  Temp: 98.5 F (36.9 C)   98.2 F (36.8 C)  TempSrc:      SpO2: 93%  92% 95%  Weight:  90.5 kg    Height:        Intake/Output Summary (Last 24 hours) at 12/10/2023 1142 Last data filed at 12/09/2023 1700 Gross per 24 hour  Intake 100 ml  Output 2001 ml  Net -1901 ml   Filed Weights   12/08/23 0408 12/09/23 0500 12/10/23 0500  Weight: 85 kg 86.2 kg 90.5 kg    Examination:  General exam: Appears calm and comfortable  Respiratory system: Clear to auscultation. Respiratory effort normal. Cardiovascular system: S1 & S2 heard, RRR.   Gastrointestinal system: Abdomen is nondistended, soft and nontender. No organomegaly or masses felt. Normal bowel sounds heard. Central nervous system: Alert and oriented. No focal neurological deficits. Extremities: mild generalized weakness Skin: No rashes, lesions or ulcers Psychiatry: Judgement and insight appear normal. Mood & affect appropriate.     Data Reviewed: I have personally reviewed following labs and imaging studies  CBC: Recent Labs  Lab 12/04/23 1312 12/05/23 0417 12/06/23 0452 12/06/23 1201 12/07/23 0345 12/08/23 0540 12/09/23 0449 12/10/23 0648  WBC 19.0*   < > 24.7*  --  15.5* 11.5* 12.3* 10.8*  NEUTROABS 16.3*  --   --   --   --   --   --   --   HGB 14.4   < > 13.1 13.3 12.3 10.8* 11.2* 12.7  HCT 47.9*   < > 42.7 40.2 37.5 33.4* 33.8* 37.1  MCV 104.6*   < > 99.8  --  96.2 95.7 92.9 90.7  PLT 209   < > 180  --  160 158 170 206   < > = values in this interval not displayed.   Basic Metabolic Panel: Recent Labs  Lab 12/06/23 0452 12/06/23 1254 12/06/23 1706 12/07/23 0345 12/07/23 1701 12/08/23 0540  12/08/23 1158 12/09/23 0449 12/10/23 0648 12/10/23 0822  NA 144  --   --  147*  --  152*  --  141  --  136  K 4.4  --   --  3.9  --  2.9* 3.5 3.1*  --  3.1*  CL 114*  --   --  114*  --  116*  --  105  --  100  CO2 19*  --   --  22  --  26  --  25  --  24  GLUCOSE 115*  --   --  120*  --  125*  --  115*  --  117*  BUN 39*  --   --  36*  --  33*  --  20  --  21  CREATININE 0.84  --   --  0.74  --  0.63  --  0.51  --  0.60  CALCIUM 8.6*  --   --  8.8*  --  8.7*  --  8.6*  --  9.0  MG 2.3 2.2 2.1 2.2 1.9  --   --   --  2.0  --   PHOS  --  1.5* 1.5* 2.1* 2.3*  --   --   --   --  2.8   GFR: Estimated Creatinine Clearance: 83.6 mL/min (by C-G formula based on SCr of 0.6 mg/dL). Liver Function Tests: Recent Labs  Lab 12/04/23 1312 12/04/23 2150  AST 162* 118*  ALT 85* 61*  ALKPHOS 85 62  BILITOT 1.0 0.5  PROT 7.7 5.5*  ALBUMIN 4.2 2.9*   No results for input(s): "LIPASE", "AMYLASE" in the last 168 hours. Recent Labs  Lab 12/04/23 2150  AMMONIA 18   Coagulation Profile: Recent Labs  Lab 12/04/23 2150  INR 1.1   Cardiac Enzymes: Recent Labs  Lab 12/04/23 1615 12/05/23 0417 12/05/23 1456 12/06/23 1201  CKTOTAL 4,155* 4,054* 2,547* 1,573*   BNP (last 3 results) No results for input(s): "PROBNP" in the last 8760 hours. HbA1C: No results for input(s): "HGBA1C" in the last 72 hours. CBG: Recent Labs  Lab 12/09/23 1207 12/09/23 2010 12/09/23 2322 12/10/23 0413 12/10/23 0831  GLUCAP 135* 131* 116* 117* 108*   Lipid Profile: No results for input(s): "CHOL", "HDL", "LDLCALC", "TRIG", "CHOLHDL", "LDLDIRECT" in the last 72 hours. Thyroid Function Tests: No results for input(s): "TSH", "T4TOTAL", "FREET4", "T3FREE", "THYROIDAB" in the last 72 hours. Anemia Panel: No results for input(s): "VITAMINB12", "FOLATE", "FERRITIN", "TIBC", "IRON", "RETICCTPCT" in the last 72 hours. Urine analysis:    Component Value Date/Time   COLORURINE AMBER (A) 12/04/2023 1312    APPEARANCEUR CLOUDY (A) 12/04/2023 1312   LABSPEC 1.019 12/04/2023 1312   PHURINE 5.0 12/04/2023 1312   GLUCOSEU NEGATIVE 12/04/2023 1312   HGBUR LARGE (A) 12/04/2023 1312   BILIRUBINUR NEGATIVE 12/04/2023 1312   KETONESUR NEGATIVE 12/04/2023 1312   PROTEINUR 100 (A) 12/04/2023 1312   NITRITE NEGATIVE 12/04/2023 1312   LEUKOCYTESUR NEGATIVE 12/04/2023 1312   Sepsis Labs: @LABRCNTIP (procalcitonin:4,lacticidven:4)  ) Recent Results (from the past 240 hours)  Culture, blood (routine x 2)     Status: None   Collection Time: 12/04/23  1:43 PM   Specimen: BLOOD RIGHT HAND  Result Value Ref Range Status   Specimen Description BLOOD RIGHT HAND  Final   Special Requests   Final    BOTTLES DRAWN AEROBIC AND ANAEROBIC Blood Culture results may not be optimal due to an inadequate volume of blood received in culture bottles   Culture   Final    NO GROWTH 5 DAYS Performed at [facility], [address] [place], [place] [address]    Report Status 12/09/2023 FINAL  Final  Culture, blood (routine x 2)     Status: None   Collection Time: 12/04/23  1:43 PM   Specimen: BLOOD RIGHT ARM  Result Value Ref Range Status   Specimen Description BLOOD RIGHT ARM  Final   Special Requests   Final    BOTTLES DRAWN AEROBIC AND ANAEROBIC Blood Culture results may not be optimal due to an inadequate volume of blood received in culture bottles   Culture   Final    NO GROWTH 5 DAYS Performed at [facility], [address], [place], [place] [address]    Report Status 12/09/2023 FINAL  Final  MRSA Next Gen by PCR, Nasal     Status: None   Collection Time: 12/04/23 11:42 PM   Specimen: Nasopharyngeal Swab; Nasal Swab  Result  Value Ref Range Status   MRSA by PCR Next Gen NOT DETECTED NOT DETECTED Final    Comment: (NOTE) The GeneXpert MRSA Assay (FDA approved for NASAL specimens only), is one component of a comprehensive MRSA colonization surveillance program. It is not intended to  diagnose MRSA infection nor to guide or monitor treatment for MRSA infections. Test performance is not FDA approved in patients less than 34 years old. Performed at [facility], [address] [place], [place] [address]   Respiratory (~20 pathogens) panel by PCR     Status: None   Collection Time: 12/04/23 11:42 PM   Specimen: Nasopharyngeal Swab; Respiratory  Result Value Ref Range Status   Adenovirus NOT DETECTED NOT DETECTED Final   Coronavirus 229E NOT DETECTED NOT DETECTED Final    Comment: (NOTE) The Coronavirus on the Respiratory Panel, DOES NOT test for the novel  Coronavirus (2019 nCoV)    Coronavirus HKU1 NOT DETECTED NOT DETECTED Final   Coronavirus NL63 NOT DETECTED NOT DETECTED Final   Coronavirus OC43 NOT DETECTED NOT DETECTED Final   Metapneumovirus NOT DETECTED NOT DETECTED Final   Rhinovirus / Enterovirus NOT DETECTED NOT DETECTED Final   Influenza A NOT DETECTED NOT DETECTED Final   Influenza B NOT DETECTED NOT DETECTED Final   Parainfluenza Virus 1 NOT DETECTED NOT DETECTED Final   Parainfluenza Virus 2 NOT DETECTED NOT DETECTED Final   Parainfluenza Virus 3 NOT DETECTED NOT DETECTED Final   Parainfluenza Virus 4 NOT DETECTED NOT DETECTED Final   Respiratory Syncytial Virus NOT DETECTED NOT DETECTED Final   Bordetella pertussis NOT DETECTED NOT DETECTED Final   Bordetella Parapertussis NOT DETECTED NOT DETECTED Final   Chlamydophila pneumoniae NOT DETECTED NOT DETECTED Final   Mycoplasma pneumoniae NOT DETECTED NOT DETECTED Final    Comment: Performed at [facility], [address] [place], [place] [address]  Culture, Respiratory w Gram Stain     Status: None   Collection Time: 12/06/23  3:11 PM   Specimen: Tracheal Aspirate  Result Value Ref Range Status   Specimen Description   Final    TRACHEAL ASPIRATE Performed at [facility], [address], [place], [place] [address]    Special Requests   Final    NONE Performed at  [facility], [address] [place], [place] [address]    Gram Stain   Final    NO WBC SEEN NO ORGANISMS SEEN Performed at [facility], [address], [place], [place] [address]    Culture FEW CANDIDA ALBICANS  Final   Report Status 12/09/2023 FINAL  Final  Culture, blood (Routine X 2) w Reflex to ID Panel     Status: None (Preliminary result)   Collection Time: 12/08/23  9:42 AM   Specimen: BLOOD  Result Value Ref Range Status   Specimen Description BLOOD BLOOD LEFT HAND  Final   Special Requests   Final    BOTTLES DRAWN AEROBIC AND ANAEROBIC Blood Culture results may not be optimal due to an inadequate volume of blood received in culture bottles   Culture   Final    NO GROWTH 2 DAYS Performed at [facility], [address], [place], [place] [address]    Report Status PENDING  Incomplete  Culture, blood (Routine X 2) w Reflex to ID Panel     Status: None (Preliminary result)   Collection Time: 12/08/23  9:42 AM   Specimen: BLOOD  Result Value Ref Range Status  Specimen Description BLOOD BLOOD RIGHT HAND  Final   Special Requests   Final    BOTTLES DRAWN AEROBIC AND ANAEROBIC Blood Culture results may not be optimal due to an inadequate volume of blood received in culture bottles   Culture   Final    NO GROWTH 2 DAYS Performed at [facility], [address], [place], [place] [address]    Report Status PENDING  Incomplete         Radiology Studies: EEG adult Result Date: 12/09/2023 Charlsie Quest, MD     12/09/2023 11:53 AM Patient Name: ZIOMARA BIRENBAUM MRN: [id number] Epilepsy Attending: Charlsie Quest Referring Physician/Provider: Jefferson Fuel, MD Date: 12/09/2023 Duration: 26.48 mins Patient history: 63yo F with ams. EEG to evaluate for seizure Level of alertness: Awake AEDs during EEG study: None Technical aspects: This EEG study was done with scalp electrodes positioned according to the 10-20 International system of  electrode placement. Electrical activity was reviewed with band pass filter of 1-70Hz , sensitivity of 7 uV/mm, display speed of 27mm/sec with a 60Hz  notched filter applied as appropriate. EEG data were recorded continuously and digitally stored.  Video monitoring was available and reviewed as appropriate. Description: The posterior dominant rhythm consists of 8 Hz activity of moderate voltage (25-35 uV) seen predominantly in posterior head regions, symmetric and reactive to eye opening and eye closing. Hyperventilation and photic stimulation were not performed.   IMPRESSION: This study is within normal limits. No seizures or epileptiform discharges were seen throughout the recording. A normal interictal EEG does not exclude the diagnosis of epilepsy. Charlsie Quest   MR BRAIN WO CONTRAST Result Date: 12/09/2023 CLINICAL DATA:  Neuro deficit with acute stroke suspected. Right-sided weakness. EXAM: MRI HEAD WITHOUT CONTRAST TECHNIQUE: Multiplanar, multiecho pulse sequences of the brain and surrounding structures were obtained without intravenous contrast. COMPARISON:  Brain MRI from 4 days ago FINDINGS: Brain: No acute infarction, hemorrhage, hydrocephalus, extra-axial collection or mass lesion. Few small FLAIR hyperintensities in the cerebral white matter, commonly seen in healthy patients of this age. Brain volume is normal Vascular: Normal flow voids Skull and upper cervical spine: Normal marrow signal Sinuses/Orbits: No progression of mild paranasal sinus opacification with secretions seen at the right sphenoid. IMPRESSION: Stable brain MRI.  No infarct or other explanation for symptoms. Electronically Signed   By: Tiburcio Pea M.D.   On: 12/09/2023 07:43        Scheduled Meds:  atorvastatin  40 mg Oral Daily   baclofen  10 mg Oral BID   busPIRone  10 mg Oral TID   Chlorhexidine Gluconate Cloth  6 each Topical Daily   feeding supplement  237 mL Oral TID BM   folic acid  1 mg Oral Daily    furosemide  40 mg Oral Daily   gabapentin  300 mg Oral QID   heparin injection (subcutaneous)  5,000 Units Subcutaneous Q8H   ipratropium-albuterol  3 mL Nebulization TID   montelukast  10 mg Oral QHS   multivitamin with minerals  1 tablet Oral Daily   oxymetazoline  1 spray Each Nare BID   pantoprazole (PROTONIX) IV  40 mg Intravenous Q24H   potassium chloride  40 mEq Oral BID   potassium chloride  40 mEq Oral Once   thiamine  100 mg Oral Daily   Or   thiamine (VITAMIN B1) injection  100 mg Intravenous Daily   Continuous Infusions:   LOS: 6 days     Anette Riedel B  Emad Brechtel, MD [facility]   If 7PM-7AM, please contact night-coverage [email or web address] Password [facility] 12/10/2023, 11:42 AM

## 2023-12-10 NOTE — Consult Note (Signed)
 PHARMACY CONSULT NOTE - ELECTROLYTES  Pharmacy Consult for Electrolyte Monitoring and Replacement   Recent Labs: Height: 5\' 8"  (172.7 cm) Weight: 90.5 kg (199 lb 8.3 oz) IBW/kg (Calculated) : 63.9 Estimated Creatinine Clearance: 83.6 mL/min (by C-G formula based on SCr of 0.6 mg/dL). Potassium (mmol/L)  Date Value  12/10/2023 3.1 (L)   Magnesium (mg/dL)  Date Value  54/06/8118 2.0   Calcium (mg/dL)  Date Value  14/78/2956 9.0   Albumin (g/dL)  Date Value  21/30/8657 2.9 (L)   Phosphorus (mg/dL)  Date Value  84/69/6295 2.8   Sodium (mmol/L)  Date Value  12/10/2023 136    Assessment  Tami Hopkins is a 63 y.o. female presenting with acute hypoxic respiratory failure. PMH significant for HLD, chronic pain, PTSD, and GERD. Pharmacy has been consulted to monitor and replace electrolytes.  MIVF:  Pertinent medications: furosemide 40 mg po daily,  KCL 40 meq po BID (ordered 2/16) Diet: FL, may use ONS per protocol   Goal of Therapy: Electrolytes WNL  Plan:  K 3.1 Will order additional KCL 40 meq po x1 at 1200.   Medical team scheduled 40 mEq of PO KCl twice daily starting 2/16 pm- has received all doses with K still low Monitor daily with AM labs  Thank you for allowing pharmacy to be a part of this patient's care.  Bari Mantis PharmD Clinical Pharmacist 12/10/2023

## 2023-12-10 NOTE — TOC PASRR Note (Signed)
 RE: Tami Hopkins Date of Birth: August 12, 1961 Date: 12/10/2023     To Whom It May Concern:   Please be advised that the above-named patient will require a short-term nursing home stay - anticipated 30 days or less for rehabilitation and strengthening.  The plan is for return home

## 2023-12-11 DIAGNOSIS — J9601 Acute respiratory failure with hypoxia: Secondary | ICD-10-CM | POA: Diagnosis not present

## 2023-12-11 DIAGNOSIS — T796XXD Traumatic ischemia of muscle, subsequent encounter: Secondary | ICD-10-CM | POA: Diagnosis not present

## 2023-12-11 DIAGNOSIS — M6281 Muscle weakness (generalized): Secondary | ICD-10-CM | POA: Diagnosis not present

## 2023-12-11 LAB — CBC WITH DIFFERENTIAL/PLATELET
Abs Immature Granulocytes: 0.16 10*3/uL — ABNORMAL HIGH (ref 0.00–0.07)
Basophils Absolute: 0 10*3/uL (ref 0.0–0.1)
Basophils Relative: 0 %
Eosinophils Absolute: 0.3 10*3/uL (ref 0.0–0.5)
Eosinophils Relative: 3 %
HCT: 38.9 % (ref 36.0–46.0)
Hemoglobin: 12.9 g/dL (ref 12.0–15.0)
Immature Granulocytes: 1 %
Lymphocytes Relative: 21 %
Lymphs Abs: 2.4 10*3/uL (ref 0.7–4.0)
MCH: 31.1 pg (ref 26.0–34.0)
MCHC: 33.2 g/dL (ref 30.0–36.0)
MCV: 93.7 fL (ref 80.0–100.0)
Monocytes Absolute: 0.6 10*3/uL (ref 0.1–1.0)
Monocytes Relative: 5 %
Neutro Abs: 8.1 10*3/uL — ABNORMAL HIGH (ref 1.7–7.7)
Neutrophils Relative %: 70 %
Platelets: 243 10*3/uL (ref 150–400)
RBC: 4.15 MIL/uL (ref 3.87–5.11)
RDW: 13.7 % (ref 11.5–15.5)
WBC: 11.6 10*3/uL — ABNORMAL HIGH (ref 4.0–10.5)
nRBC: 0 % (ref 0.0–0.2)

## 2023-12-11 LAB — BASIC METABOLIC PANEL
Anion gap: 11 (ref 5–15)
BUN: 19 mg/dL (ref 8–23)
CO2: 21 mmol/L — ABNORMAL LOW (ref 22–32)
Calcium: 9.1 mg/dL (ref 8.9–10.3)
Chloride: 103 mmol/L (ref 98–111)
Creatinine, Ser: 0.71 mg/dL (ref 0.44–1.00)
GFR, Estimated: >60 mL/min (ref >60)
Glucose, Bld: 193 mg/dL — ABNORMAL HIGH (ref 70–99)
Potassium: 3 mmol/L — ABNORMAL LOW (ref 3.5–5.1)
Sodium: 135 mmol/L (ref 135–145)

## 2023-12-11 LAB — CK: Total CK: 122 U/L (ref 38–234)

## 2023-12-11 LAB — GASTROINTESTINAL PANEL BY PCR, STOOL (REPLACES STOOL CULTURE)

## 2023-12-11 LAB — VITAMIN B12: Vitamin B-12: 528 pg/mL (ref 180–914)

## 2023-12-11 MED ORDER — LOPERAMIDE HCL 2 MG PO CAPS
2.0000 mg | ORAL_CAPSULE | ORAL | Status: DC | PRN
  Administered 2023-12-11 – 2023-12-28 (×2): 2 mg via ORAL
  Filled 2023-12-11 (×3): qty 1

## 2023-12-11 MED ORDER — IPRATROPIUM-ALBUTEROL 0.5-2.5 (3) MG/3ML IN SOLN: 3.0000 mL | RESPIRATORY_TRACT | Status: DC | PRN

## 2023-12-11 MED ORDER — BUDESONIDE 0.5 MG/2ML IN SUSP
0.5000 mg | Freq: Two times a day (BID) | RESPIRATORY_TRACT | Status: DC
  Administered 2023-12-11: 0.5 mg via RESPIRATORY_TRACT
  Filled 2023-12-11 (×3): qty 2

## 2023-12-11 MED ORDER — GERHARDT'S BUTT CREAM
TOPICAL_CREAM | Freq: Two times a day (BID) | CUTANEOUS | Status: DC
  Administered 2023-12-13 – 2023-12-14 (×2): 1 via TOPICAL
  Filled 2023-12-11 (×2): qty 60

## 2023-12-11 MED ORDER — CHOLESTYRAMINE LIGHT 4 G PO PACK
4.0000 g | PACK | Freq: Two times a day (BID) | ORAL | Status: DC
  Administered 2023-12-11 – 2023-12-13 (×5): 4 g via ORAL
  Filled 2023-12-11 (×5): qty 1

## 2023-12-11 MED ORDER — POTASSIUM CHLORIDE CRYS ER 20 MEQ PO TBCR
40.0000 meq | EXTENDED_RELEASE_TABLET | ORAL | Status: AC
  Administered 2023-12-11 (×2): 40 meq via ORAL
  Filled 2023-12-11 (×2): qty 2

## 2023-12-11 NOTE — Progress Notes (Signed)
 NEUROLOGY CONSULT FOLLOW UP NOTE   Date of service: December 11, 2023 Patient Name: Tami Hopkins MRN:  [id number] DOB:  01-18-1961  Interval Hx/subjective  Continues to have diarrhea. Now on enteric precautions. The patient expresses annoyance, when in answer to her question regarding why she has uncontrollable diarrhea, examiner describes opiate withdrawal as a possible etiology. She is complaining of pain "all over".   Vitals   Vitals:   12/11/23 0500 12/11/23 0753 12/11/23 0836 12/11/23 1544  BP:   108/75 118/79  Pulse:  95 87 89  Resp:  18 17 17   Temp:   98.2 F (36.8 C) 98.5 F (36.9 C)  TempSrc:      SpO2:  96% 95% 97%  Weight: 94 kg     Height:         Body mass index is 31.51 kg/m.  Physical Exam   Constitutional: Appears well-developed and well-nourished.  Psych: Dysthymic affect with mild agitation.  Eyes: No scleral injection.  Head: Normocephalic.  Extremities: Limb edema of RUE is somewhat improved since yesterday.   Neurologic Examination   Ment: Awake and alert. Speech is fluent without evidence for naming deficit. Able to answer questions and follow all commands that she is willing to comply with, but has poor attention and is mildly agitated.  CN: Eyes are conjugate without forced deviation. Face symmetric. Phonation intact. Refuses to participate in detailed exam.  Motor: Refuses to participate in detailed exam, but does lift and move LUE with apparent normal strength.  RUE: Improved wrist extension. Still not able to elevate arm at shoulder. Can elevate forearm antigravity in flexion (improved) but refuses further testing.  BLE: Refuses testing Sensory: Refuses testing Reflexes: Noncooperative Cerebellar: Noncooperative   Medications  Current Facility-Administered Medications:    acetaminophen (TYLENOL) tablet 650 mg, 650 mg, Per Tube, Q6H PRN, Ezequiel Essex, NP, 650 mg at 12/08/23 0552   atorvastatin (LIPITOR) tablet 40 mg, 40 mg, Oral,  Daily, Wouk, Wilfred Curtis, MD, 40 mg at 12/11/23 4098   baclofen (LIORESAL) tablet 5 mg, 5 mg, Oral, BID, Wouk, Wilfred Curtis, MD, 5 mg at 12/11/23 0957   budesonide (PULMICORT) nebulizer solution 0.5 mg, 0.5 mg, Nebulization, BID, Sreenath, Sudheer B, MD, 0.5 mg at 12/11/23 1422   busPIRone (BUSPAR) tablet 10 mg, 10 mg, Oral, TID, Wouk, Wilfred Curtis, MD, 10 mg at 12/11/23 1514   cholestyramine light (PREVALITE) packet 4 g, 4 g, Oral, BID, Sreenath, Sudheer B, MD, 4 g at 12/11/23 1001   clonazePAM (KLONOPIN) tablet 0.5 mg, 0.5 mg, Oral, BID PRN, Kathrynn Running, MD, 0.5 mg at 12/11/23 1514   enoxaparin (LOVENOX) injection 40 mg, 40 mg, Subcutaneous, Q24H, Wouk, Wilfred Curtis, MD, 40 mg at 12/11/23 0959   feeding supplement (ENSURE ENLIVE / ENSURE PLUS) liquid 237 mL, 237 mL, Oral, TID BM, Kasa, Kurian, MD, 237 mL at 12/11/23 1350   fluticasone (FLONASE) 50 MCG/ACT nasal spray 1 spray, 1 spray, Each Nare, BID PRN, Dahlia Byes, NP, 1 spray at 12/10/23 1849   folic acid (FOLVITE) tablet 1 mg, 1 mg, Oral, Daily, Ezequiel Essex, NP, 1 mg at 12/11/23 0957   gabapentin (NEURONTIN) capsule 300 mg, 300 mg, Oral, QID, Wouk, Wilfred Curtis, MD, 300 mg at 12/11/23 1654   ipratropium-albuterol (DUONEB) 0.5-2.5 (3) MG/3ML nebulizer solution 3 mL, 3 mL, Nebulization, Q4H PRN, Sreenath, Sudheer B, MD   montelukast (SINGULAIR) tablet 10 mg, 10 mg, Oral, QHS, Ezequiel Essex, NP, 10 mg at 12/10/23 2254  multivitamin with minerals tablet 1 tablet, 1 tablet, Oral, Daily, Ezequiel Essex, NP, 1 tablet at 12/11/23 0957   Oral care mouth rinse, 15 mL, Mouth Rinse, PRN, Assaker, [place], MD   oxyCODONE (Oxy IR/ROXICODONE) immediate release tablet 5 mg, 5 mg, Oral, Q6H PRN, Ezequiel Essex, NP, 5 mg at 12/10/23 0540   oxymetazoline (AFRIN) 0.05 % nasal spray 1 spray, 1 spray, Each Nare, BID, Erin Fulling, MD, 1 spray at 12/11/23 1000   pantoprazole (PROTONIX) injection 40 mg, 40 mg, Intravenous, Q24H, Assaker,  Jean-Pierre, MD, 40 mg at 12/11/23 1001   thiamine (VITAMIN B1) tablet 100 mg, 100 mg, Oral, Daily, 100 mg at 12/11/23 0958 **OR** thiamine (VITAMIN B1) injection 100 mg, 100 mg, Intravenous, Daily, Barrie Folk [facility]  Labs and Diagnostic Imaging   CBC:  Recent Labs  Lab 12/10/23 0648 12/11/23 0606  WBC 10.8* 11.6*  NEUTROABS  --  8.1*  HGB 12.7 12.9  HCT 37.1 38.9  MCV 90.7 93.7  PLT 206 243    Basic Metabolic Panel:  Lab Results  Component Value Date   NA 135 12/11/2023   K 3.0 (L) 12/11/2023   CO2 21 (L) 12/11/2023   GLUCOSE 193 (H) 12/11/2023   BUN 19 12/11/2023   CREATININE 0.71 12/11/2023   CALCIUM 9.1 12/11/2023   GFRNONAA >60 12/11/2023   GFRAA >60 05/14/2015   Lipid Panel: No results found for: "LDLCALC" HgbA1c: No results found for: "HGBA1C" Urine Drug Screen:     Component Value Date/Time   LABOPIA POSITIVE (A) 12/04/2023 1312   COCAINSCRNUR NONE DETECTED 12/04/2023 1312   LABBENZ POSITIVE (A) 12/04/2023 1312   AMPHETMU NONE DETECTED 12/04/2023 1312   THCU NONE DETECTED 12/04/2023 1312   LABBARB NONE DETECTED 12/04/2023 1312    Alcohol Level     Component Value Date/Time   ETH <10 12/04/2023 1343   INR  Lab Results  Component Value Date   INR 1.1 12/04/2023     Assessment   63 year old woman with a history of hyperlipidemia, COPD, asthma, tobacco use, chronic pain syndrome, anxiety, PTSD, IBS, and GERD who presented to the ED for unresponsiveness after being found down at home, possibly for up to 2 days, requiring intubation and admission to the ICU. Etiology favored to be narcotic overdose or polypharmacy. She had persistent right-sided weakness on initial neurology exam Sunday, particularly in her right arm where she had no movement. During the exam on Sunday the patient had near continuous twitching of her upper R face / periorbital region; this was not noted to result in mental status change, however she had been altered since admission. No  motor activity c/f seizure was seen anywhere else in the body on Sunday's exam.  Brain MRI on February 13 showed no acute abnormality.  Patient was extubated on Sunday.  ICU suspected aspiration pneumonia and COPD exacerbation as well as rhabdomyolysis. EEG was normal.  - Exam today is limited by poor patient cooperation, but RUE biceps and wrist extension strength are improved relative to yesterday.  - MRI brachial plexus w/wo contrast: Prominent edema and enhancement in the right paraspinous muscles extending from C4 to T3. This is nonspecific but may reflect rhabdomyolysis given clinical history. Diffuse low-level muscle edema in the right infraspinatus and to a lesser extent the supraspinatus muscles, suspicious for denervation related change (acute brachial neuritis). No muscle atrophy. Normal appearance of the right brachial plexus. - MRI cervical spine w/wo contrast: Diffuse edema throughout the right-sided paraspinal muscles with  areas of irregular contrast enhancement, possibly indicating myositis; this extends inferiorly as far as the upper thoracic spine. Mild spinal canal stenosis and severe right neural foraminal stenosis at C5-6. Moderate bilateral C6-7 neural foraminal stenosis. - Repeat MRI brain is stable with no infarct or other explanation for symptoms - B12 level is normal - CK now normal at 122 (was 4155 on admission) - Atorvastatin has been discontinued - Impression:  - Presentation for unresponsiveness: Most likely secondary to narcotic overdose or polypharmacy. Diarrhea here may be a manifestation of opioid withdrawal - she may have had a significantly higher daily opioid intake at home compared to her opioid regimen during this admission. - RUE weakness, proximal muscle groups worse than distal muscles, overall continuing to improve:  - Based on exam with worse weakness in the proximal myotomes of her arm than distally, suggests possible compression neuropathy or rhabdomyolysis  involving selectively the RUE given the swelling seen on exam. Rhabdomyolysis is higher on the DDx due to likely prolonged compression of her RUE and paraspinals as she was found unresponsive and immobile, possibly for up to two days. The dysesthesia/allodynia and numbness of her RUE points more to nerve injury than rhabdo, however.  - Upper arm compartment syndrome is also possible, but per the literature is rare.  - MRI of right brachial plexus suggests that there may be a component of acute right brachial neuritis. This may be due to prolonged traction on the brachial plexus due to awkward positioning of the limb during what is thought to have been a prolonged downtime.  - No evidence for stroke on serial MRIs.  - Elevated CK levels, now resolved. Rhabdomyolysis due to prolonged immobility again is high on the DDx rather than an inflammatory myopathy given improvement in CKs during this admission. Of note, she also had elevated CK levels during her admission in December. A possible statin myopathy may be a contributing factor.     Recommendations  - Atorvastatin has been discontinued. Can consider restarting once she regains full strength in her RUE and BLE and CKs are normalized. If restarting her statin, she should have her CKs regularly checked as an outpatient.  - PT/OT. OOB to chair daily if possible - Minimize sedating medications - Observe for possible opioid withdrawal symptoms - No indication for an anticonvulsant at this time.  - Continue to monitor CKs - Correction of her hypokalemia - Agree with thiamine supplementation - Neurohospitalist service will sign off.  - She may benefit from outpatient Neurology follow up for EMG/NCS of her RUE and paraspinal muscles.  ______________________________________________________________________   Dessa Phi, Kamyrah Feeser, MD [facility]

## 2023-12-11 NOTE — Evaluation (Signed)
 Occupational Therapy Evaluation Patient Details Name: Tami Hopkins MRN: [id number] DOB: 04-09-1961 Today's Date: 12/11/2023   History of Present Illness   This is a 63 year old woman with a history of hyperlipidemia, COPD, asthma, tobacco use, chronic pain syndrome, anxiety, PTSD, IBS, and GERD who presented to the ED for unresponsiveness after being found down at home, possibly for up to 2 days, requiring intubation and admission to the ICU. Etiology favored to be narcotic overdose or polypharmacy     Clinical Impressions Chart reviewed, patient greeted in bed, labile, oriented to self and place, requires significant encouragement for participation in OT evaluation. PTA pt reports she is MOD I in ADL, amb with RW however is a poor historian on this date and PLOF will need to be confirmed. Pt reports RUE is painful, significantly impaired AROM on this date with PROM appearing WFL however limited by pt reported pain. RUE is re-positioned for appropriate support. Pt performs grooming/feeding tasks with SET UP in bed, MAX A for repositioning in bed. Pt declines transition to edge of bed/chair despite max encouragement, states she will "do it tomorrow". Nurse notified via secure chat. Pt presents with deficits in strength, activity tolerance, cognition affecting safe and optimal ADL completion. Pt will benefit from acute OT to address deficits and to facilitate return to PLOF. Pt is left as received, all needs met. OT will continue to follow.      If plan is discharge home, recommend the following:   A lot of help with bathing/dressing/bathroom;A lot of help with walking and/or transfers     Functional Status Assessment   Patient has had a recent decline in their functional status and demonstrates the ability to make significant improvements in function in a reasonable and predictable amount of time.     Equipment Recommendations   Other (comment) (defer)     Recommendations for  Other Services         Precautions/Restrictions   Precautions Precautions: Fall Recall of Precautions/Restrictions: Impaired     Mobility Bed Mobility Overal bed mobility: Needs Assistance Bed Mobility: Rolling Rolling: Max assist              Transfers Overall transfer level: Needs assistance                 General transfer comment: pt declined despite encouragement and education re: importance of continued mobility      Balance                                           ADL either performed or assessed with clinical judgement   ADL Overall ADL's : Needs assistance/impaired Eating/Feeding: Set up;Sitting Eating/Feeding Details (indicate cue type and reason): drinking Grooming: Wash/dry face;Sitting;Set up           Upper Body Dressing : Moderate assistance   Lower Body Dressing: Maximal assistance Lower Body Dressing Details (indicate cue type and reason): anticipate   Toilet Transfer Details (indicate cue type and reason): pt declines on this date despite max encouragement           General ADL Comments: pt generally declining participating in most activity despite max encouragement     Vision Baseline Vision/History: 1 Wears glasses Patient Visual Report: No change from baseline Additional Comments: will continue to assess     Perception         Praxis  Pertinent Vitals/Pain Pain Assessment Pain Assessment: Faces Faces Pain Scale: Hurts even more Pain Location: generalized Pain Descriptors / Indicators: Guarding, Grimacing Pain Intervention(s): Limited activity within patient's tolerance, Monitored during session, Repositioned     Extremity/Trunk Assessment Upper Extremity Assessment Upper Extremity Assessment: RUE deficits/detail RUE Deficits / Details: AROM: can shrug shoulders; shoulder flexion/extension unable to perform, elbow approx 1/4 full AROM, wrist approx 1/2 full AROM, able to open/close  hand with weak grip strength, wiggle fingers; PROM grossly WFL however pt grimacing and guarding throughout attempts, will continue to assess; RUE: Unable to fully assess due to pain RUE Sensation: WNL RUE Coordination: decreased fine motor   Lower Extremity Assessment Lower Extremity Assessment: Defer to PT evaluation;Generalized weakness       Communication Communication Communication: No apparent difficulties   Cognition Arousal: Alert Behavior During Therapy: Lability Cognition: No family/caregiver present to determine baseline, Cognition impaired   Orientation impairments: Time, Situation Awareness: Intellectual awareness impaired, Online awareness impaired   Attention impairment (select first level of impairment): Sustained attention Executive functioning impairment (select all impairments): Problem solving, Reasoning OT - Cognition Comments: pt requires significant encouragement for participation in any activities                 Following commands: Impaired Following commands impaired: Follows one step commands with increased time     Cueing  General Comments   Cueing Techniques: Verbal cues;Gestural cues;Tactile cues;Visual cues      Exercises Other Exercises Other Exercises: edu re: role of OT, role of rehab, discharge recommendations, home safety, falls prevention   Shoulder Instructions      Home Living Family/patient expects to be discharged to:: Private residence Living Arrangements: Non-relatives/Friends                           Home Equipment: Agricultural consultant (2 wheels)   Additional Comments: pt reports she lives with a roommate, will not discharge back there, was amb with a RW; pt is a poor historian and will need to confirm PLOF      Prior Functioning/Environment Prior Level of Function : Patient poor historian/Family not available               ADLs Comments: pt reports she amb with a RW, is MOD I in ADL/IADL, will need  to confirm    OT Problem List: Decreased strength;Decreased activity tolerance;Decreased knowledge of use of DME or AE;Decreased safety awareness;Decreased cognition;Impaired balance (sitting and/or standing)   OT Treatment/Interventions: Self-care/ADL training;Therapeutic exercise;Patient/family education;Balance training;DME and/or AE instruction;Energy conservation;Therapeutic activities      OT Goals(Current goals can be found in the care plan section)   Acute Rehab OT Goals Patient Stated Goal: get home to her dog OT Goal Formulation: With patient Time For Goal Achievement: 12/24/23 Potential to Achieve Goals: Fair ADL Goals Pt Will Perform Grooming: with modified independence;sitting Pt Will Perform Lower Body Dressing: with modified independence;sitting/lateral leans Pt Will Transfer to Toilet: with modified independence;ambulating Pt Will Perform Toileting - Clothing Manipulation and hygiene: with modified independence;sit to/from stand;sitting/lateral leans   OT Frequency:  Min 1X/week    Co-evaluation              AM-PAC OT "6 Clicks" Daily Activity     Outcome Measure Help from another person eating meals?: None Help from another person taking care of personal grooming?: None Help from another person toileting, which includes using toliet, bedpan, or urinal?: A Lot Help from  another person bathing (including washing, rinsing, drying)?: A Lot Help from another person to put on and taking off regular upper body clothing?: A Little Help from another person to put on and taking off regular lower body clothing?: A Lot 6 Click Score: 17   End of Session Nurse Communication: Mobility status  Activity Tolerance: Patient limited by pain Patient left: in bed;with call bell/phone within reach;with bed alarm set  OT Visit Diagnosis: Other abnormalities of gait and mobility (R26.89);Muscle weakness (generalized) (M62.81)                Time: 4098-1191 OT Time  Calculation (min): 21 min Charges:  OT General Charges $OT Visit: 1 Visit OT Evaluation $OT Eval Low Complexity: 1 Low Oleta Mouse, OTD OTR/L  12/11/23, 9:35 AM

## 2023-12-11 NOTE — Progress Notes (Signed)
 PROGRESS NOTE    KIRRA VERGA  ZOX:[id number] DOB: June 11, 1961 DOA: 12/04/2023 PCP: Ardyth Man, PA-C    Brief Narrative:   Tami Hopkins is a 63 y.o. female with a PMH significant for HLD, COPD, Asthma, Tobacco use, Chronic pain syndrome, Anxiety, PTSD, IBS, and GERD who presents to the ED for altered mental status.  Per chart review, family had not heard from the patient for the past 2 days and called for a wellness check.  Patient subsequently found unresponsive sitting on the couch, roommate stated that they thought patient had been sleeping on the couch for the past few days.  Of note, patient was admitted to the hospital a couple of months ago for altered mental status due to suspected polypharmacy use.  Upon EMS arrival patient was given 2 mg of intranasal Narcan with no effect, subsequently given 2 mg IV Narcan with slight respiratory improvement, yet remained unresponsive.  Upon arrival to the ER, BP 94/78, afebrile, 100% on room air, she was noted to have vomit in her airway during suctioning. She was intubated for airway protection. Labs revealed K 6.0, Creat 3.72, BUN 62, AST 162, ALT 85, CK 4155, Troponin 91 and 74, lactic acid 1.6, WBC 19.0, Drug screen + benzodiazepine, opiate, tricyclics. She was given a total of 3 liters of IVF, started on broad spectrum antibiotics, cultures sent, and started on Norepinephrine infusion. CT c-spine without noted fracture, CT head without acute pathology, CT A/P/T bilateral atelectasis, stable non obstructing right renal calculi, hepatic steatosis, aortic atherosclerosis, no acute intra abdominal process.     PCCM was consulted for Medical Management in this critically ill patient in septic shock and respiratory failure suspected due to polysubstance abuse and aspiration Pneumonia   2/12: Admitted with AMS due to polysubstance abuse. Septic shock, levophed, cultures sent, intubated  02/13: Brainstem reflexes improved today with regain of  cough, gag, pupils reactive.  02/14: Improved mental status but not yet following commands.  02/16: Extubated to nasal cannula.  With right sided weakness in upper and lower extremity and facial twitches. Febrile to 101. Neurology is consulted.  2/18: [facility] assumed care   Assessment & Plan:   Principal Problem:   Acute hypoxic respiratory failure (HCC) Active Problems:   Rhabdomyolysis   Aspiration pneumonia (HCC)   AKI (acute kidney injury) (HCC)   Chronic obstructive pulmonary disease (HCC)   Chronic pain syndrome   Post-traumatic stress disorder   Gastroesophageal reflux disease with esophagitis   High cholesterol   Moderate persistent asthma without complication   Tobacco use disorder   Acute respiratory failure (HCC)  # Unintentional opioid overdose # Opioid use disorder # Chronic pain Denies intentional overdose, denies SI - will need close outpatient f/u with pain medicine and plan to wean off opioids vs suboxone/subutex or other OUD treatment - continue home prn oxy for now - cont home gabapentin, baclofen (baclofen at reduced dose)   # GAD - cont home klonipin prn but at reduced dose, as above strongly advise outpatient taper with eventual discontinuation - cont home buspar   # Hypokalemia - replete and monitor   # AKI Prerenal 2/2 overdose, resolved   # Loose stool Noted today - will d/c stool softeners and monitor   # Aspiration pneumonia # Acute hypoxic respiratory failure Extubated and now breathing comfortably on room air, most recent cxr no infiltrates. Finished a course of IV abx in the ICU. MRI nothing acute, EEG no seizure - monitor   #  Debility PT advising SNF, TOC is aware and pursuing a bed  # Facial twitching # Right upper extremity weakness # Myositis/rhabdomyolysis Patient with severe and persistent right upper extremity weakness.  MRI brain and cervical spine overall unrevealing.  Patient does have diffuse soft tissue edema  on right shoulder concerning for myositis.  CK has normalized. Plan: Upper extremity compartment syndrome is felt unlikely.  Patient has palpable pulses on right upper extremity.  I suspect myositis secondary to rhabdomyolysis from prolonged immobility.  Also possibly contribution from statin induced myopathy.  Statin is held and patient is encouraged to work regularly with physical therapy.     DVT prophylaxis: SQ lovenox Code Status: FULL Family Communication:Daughter via phone 2/ Disposition Plan: Status is: Inpatient Remains inpatient appropriate because: Weakness decreased from baseline.  Resolving encephalopathy.  Will need SNF   Level of care: Med-Surg  Consultants:  None  Procedures:  None  Antimicrobials: None    Subjective: Seen and examined.  Still slightly confused.  Right-sided weakness.  Complains of diarrhea.  Objective: Vitals:   12/11/23 0500 12/11/23 0753 12/11/23 0836 12/11/23 1544  BP:   108/75 118/79  Pulse:  95 87 89  Resp:  18 17 17   Temp:   98.2 F (36.8 C) 98.5 F (36.9 C)  TempSrc:      SpO2:  96% 95% 97%  Weight: 94 kg     Height:        Intake/Output Summary (Last 24 hours) at 12/11/2023 1551 Last data filed at 12/11/2023 1445 Gross per 24 hour  Intake --  Output 1000 ml  Net -1000 ml   Filed Weights   12/09/23 0500 12/10/23 0500 12/11/23 0500  Weight: 86.2 kg 90.5 kg 94 kg    Examination:  General exam: Appears fatigued Respiratory system: Clear to auscultation. Respiratory effort normal. Cardiovascular system: S1 & S2 heard, RRR. No JVD, murmurs, rubs, gallops or clicks. No pedal edema. Gastrointestinal system: Soft, NT/ND, normal bowel sounds Central nervous system: Alert and oriented x 2.  No focal deficits Extremities: Right upper extremity weak Skin: No rashes, lesions or ulcers Psychiatry: Judgement and insight appear impaired. Mood & affect flattened/confused.     Data Reviewed: I have personally reviewed following  labs and imaging studies  CBC: Recent Labs  Lab 12/07/23 0345 12/08/23 0540 12/09/23 0449 12/10/23 0648 12/11/23 0606  WBC 15.5* 11.5* 12.3* 10.8* 11.6*  NEUTROABS  --   --   --   --  8.1*  HGB 12.3 10.8* 11.2* 12.7 12.9  HCT 37.5 33.4* 33.8* 37.1 38.9  MCV 96.2 95.7 92.9 90.7 93.7  PLT 160 158 170 206 243   Basic Metabolic Panel: Recent Labs  Lab 12/06/23 1254 12/06/23 1706 12/07/23 0345 12/07/23 1701 12/08/23 0540 12/08/23 1158 12/09/23 0449 12/10/23 0648 12/10/23 0822 12/11/23 0606  NA  --   --  147*  --  152*  --  141  --  136 135  K  --   --  3.9  --  2.9* 3.5 3.1*  --  3.1* 3.0*  CL  --   --  114*  --  116*  --  105  --  100 103  CO2  --   --  22  --  26  --  25  --  24 21*  GLUCOSE  --   --  120*  --  125*  --  115*  --  117* 193*  BUN  --   --  36*  --  33*  --  20  --  21 19  CREATININE  --   --  0.74  --  0.63  --  0.51  --  0.60 0.71  CALCIUM  --   --  8.8*  --  8.7*  --  8.6*  --  9.0 9.1  MG 2.2 2.1 2.2 1.9  --   --   --  2.0  --   --   PHOS 1.5* 1.5* 2.1* 2.3*  --   --   --   --  2.8  --    GFR: Estimated Creatinine Clearance: 85.1 mL/min (by C-G formula based on SCr of 0.71 mg/dL). Liver Function Tests: Recent Labs  Lab 12/04/23 2150  AST 118*  ALT 61*  ALKPHOS 62  BILITOT 0.5  PROT 5.5*  ALBUMIN 2.9*   No results for input(s): "LIPASE", "AMYLASE" in the last 168 hours. Recent Labs  Lab 12/04/23 2150  AMMONIA 18   Coagulation Profile: Recent Labs  Lab 12/04/23 2150  INR 1.1   Cardiac Enzymes: Recent Labs  Lab 12/04/23 1615 12/05/23 0417 12/05/23 1456 12/06/23 1201 12/11/23 0606  CKTOTAL 4,155* 4,054* 2,547* 1,573* 122   BNP (last 3 results) No results for input(s): "PROBNP" in the last 8760 hours. HbA1C: No results for input(s): "HGBA1C" in the last 72 hours. CBG: Recent Labs  Lab 12/09/23 1207 12/09/23 2010 12/09/23 2322 12/10/23 0413 12/10/23 0831  GLUCAP 135* 131* 116* 117* 108*   Lipid Profile: No results  for input(s): "CHOL", "HDL", "LDLCALC", "TRIG", "CHOLHDL", "LDLDIRECT" in the last 72 hours. Thyroid Function Tests: No results for input(s): "TSH", "T4TOTAL", "FREET4", "T3FREE", "THYROIDAB" in the last 72 hours. Anemia Panel: Recent Labs    12/11/23 0606  VITAMINB12 528   Sepsis Labs: Recent Labs  Lab 12/04/23 2150 12/08/23 0540  PROCALCITON 4.37 0.30    Recent Results (from the past 240 hours)  Culture, blood (routine x 2)     Status: None   Collection Time: 12/04/23  1:43 PM   Specimen: BLOOD RIGHT HAND  Result Value Ref Range Status   Specimen Description BLOOD RIGHT HAND  Final   Special Requests   Final    BOTTLES DRAWN AEROBIC AND ANAEROBIC Blood Culture results may not be optimal due to an inadequate volume of blood received in culture bottles   Culture   Final    NO GROWTH 5 DAYS Performed at [facility], [address] [place], [place] [address]    Report Status 12/09/2023 FINAL  Final  Culture, blood (routine x 2)     Status: None   Collection Time: 12/04/23  1:43 PM   Specimen: BLOOD RIGHT ARM  Result Value Ref Range Status   Specimen Description BLOOD RIGHT ARM  Final   Special Requests   Final    BOTTLES DRAWN AEROBIC AND ANAEROBIC Blood Culture results may not be optimal due to an inadequate volume of blood received in culture bottles   Culture   Final    NO GROWTH 5 DAYS Performed at [facility], [address], [place], [place] [address]    Report Status 12/09/2023 FINAL  Final  MRSA Next Gen by PCR, Nasal     Status: None   Collection Time: 12/04/23 11:42 PM   Specimen: Nasopharyngeal Swab; Nasal Swab  Result Value Ref Range Status   MRSA by PCR Next Gen NOT DETECTED NOT DETECTED Final    Comment: (NOTE) The GeneXpert MRSA Assay (FDA approved for NASAL  specimens only), is one component of a comprehensive MRSA colonization surveillance program. It is not intended to diagnose MRSA infection nor to guide or monitor treatment  for MRSA infections. Test performance is not FDA approved in patients less than 58 years old. Performed at [facility] [place], [address] [place], [place] [address]   Respiratory (~20 pathogens) panel by PCR     Status: None   Collection Time: 12/04/23 11:42 PM   Specimen: Nasopharyngeal Swab; Respiratory  Result Value Ref Range Status   Adenovirus NOT DETECTED NOT DETECTED Final   Coronavirus 229E NOT DETECTED NOT DETECTED Final    Comment: (NOTE) The Coronavirus on the Respiratory Panel, DOES NOT test for the novel  Coronavirus (2019 nCoV)    Coronavirus HKU1 NOT DETECTED NOT DETECTED Final   Coronavirus NL63 NOT DETECTED NOT DETECTED Final   Coronavirus OC43 NOT DETECTED NOT DETECTED Final   Metapneumovirus NOT DETECTED NOT DETECTED Final   Rhinovirus / Enterovirus NOT DETECTED NOT DETECTED Final   Influenza A NOT DETECTED NOT DETECTED Final   Influenza B NOT DETECTED NOT DETECTED Final   Parainfluenza Virus 1 NOT DETECTED NOT DETECTED Final   Parainfluenza Virus 2 NOT DETECTED NOT DETECTED Final   Parainfluenza Virus 3 NOT DETECTED NOT DETECTED Final   Parainfluenza Virus 4 NOT DETECTED NOT DETECTED Final   Respiratory Syncytial Virus NOT DETECTED NOT DETECTED Final   Bordetella pertussis NOT DETECTED NOT DETECTED Final   Bordetella Parapertussis NOT DETECTED NOT DETECTED Final   Chlamydophila pneumoniae NOT DETECTED NOT DETECTED Final   Mycoplasma pneumoniae NOT DETECTED NOT DETECTED Final    Comment: Performed at [facility], [address], [place], [place] [address]  Culture, Respiratory w Gram Stain     Status: None   Collection Time: 12/06/23  3:11 PM   Specimen: Tracheal Aspirate  Result Value Ref Range Status   Specimen Description   Final    TRACHEAL ASPIRATE Performed at [facility], [address], [place], [place] [address]    Special Requests   Final    NONE Performed at [facility], [address] [place], [place]  [address]    Gram Stain   Final    NO WBC SEEN NO ORGANISMS SEEN Performed at [facility], [address] [place], [place] [address]    Culture FEW CANDIDA ALBICANS  Final   Report Status 12/09/2023 FINAL  Final  Culture, blood (Routine X 2) w Reflex to ID Panel     Status: None (Preliminary result)   Collection Time: 12/08/23  9:42 AM   Specimen: BLOOD  Result Value Ref Range Status   Specimen Description BLOOD BLOOD LEFT HAND  Final   Special Requests   Final    BOTTLES DRAWN AEROBIC AND ANAEROBIC Blood Culture results may not be optimal due to an inadequate volume of blood received in culture bottles   Culture   Final    NO GROWTH 3 DAYS Performed at [facility], [address], [place], [place] [address]    Report Status PENDING  Incomplete  Culture, blood (Routine X 2) w Reflex to ID Panel     Status: None (Preliminary result)   Collection Time: 12/08/23  9:42 AM   Specimen: BLOOD  Result Value Ref Range Status   Specimen Description BLOOD BLOOD RIGHT HAND  Final   Special Requests   Final    BOTTLES DRAWN AEROBIC AND ANAEROBIC Blood Culture results may not  be optimal due to an inadequate volume of blood received in culture bottles   Culture   Final    NO GROWTH 3 DAYS Performed at [facility], [address] [place], [place] [address]    Report Status PENDING  Incomplete         Radiology Studies: MR BRACHIAL PLEXUS W WO CM RT Result Date: 12/11/2023 CLINICAL DATA:  Right upper extremity weakness. History of polysubstance abuse. Elevated CK. EXAM: MRI BRACHIAL PLEXUS WITHOUT AND WITH CONTRAST TECHNIQUE: Multiplanar, multiecho pulse sequences of the neck and surrounding structures were obtained without and with intravenous contrast. The field of view was focused on the right brachial plexus from the neural foramina to the axilla. CONTRAST:  10mL GADAVIST GADOBUTROL 1 MMOL/ML IV SOLN COMPARISON:  MRI cervical spine from same day. FINDINGS:  Spinal cord Normal caliber and signal of visualized spinal cord. Brachial plexus Roots: Normal. Trunks: Normal. Divisions: Normal. Cords: Normal. Branches: Normal. Muscles and tendons Prominent edema and enhancement in the right paraspinous muscles extending from C4 to T3. Diffuse low-level muscle edema in the right infraspinatus and to a lesser extent the supraspinatus muscles. No significant muscle atrophy. Bones Cervical spine: Please see separate MRI cervical spine report from same day. C7 transverse processes: Normal. Marrow signal: Normal. Other findings None. IMPRESSION: 1. Prominent edema and enhancement in the right paraspinous muscles extending from C4 to T3. This is nonspecific but may reflect rhabdomyolysis given clinical history. 2. Diffuse low-level muscle edema in the right infraspinatus and to a lesser extent the supraspinatus muscles, suspicious for denervation related change (acute brachial neuritis). No muscle atrophy. 3. Normal appearance of the right brachial plexus. Electronically Signed   By: Obie Dredge M.D.   On: 12/11/2023 08:59   MR CERVICAL SPINE W WO CONTRAST Result Date: 12/10/2023 CLINICAL DATA:  Right-sided arm weakness EXAM: MRI CERVICAL SPINE WITHOUT AND WITH CONTRAST TECHNIQUE: Multiplanar and multiecho pulse sequences of the cervical spine, to include the craniocervical junction and cervicothoracic junction, were obtained without and with intravenous contrast. CONTRAST:  10mL GADAVIST GADOBUTROL 1 MMOL/ML IV SOLN COMPARISON:  None Available. FINDINGS: Alignment: Physiologic. Vertebrae: No fracture, evidence of discitis, or bone lesion. Cord: Normal signal and morphology. Posterior Fossa, vertebral arteries, paraspinal tissues: There is diffuse edema throughout the right-sided paraspinal muscles. This is associated with areas of irregular contrast enhancement, possibly indicating myositis. This extends inferiorly as far as the upper thoracic spine. Disc levels: C1-2:  Unremarkable. C2-3: Normal disc space and facet joints. There is no spinal canal stenosis. No neural foraminal stenosis. C3-4: Moderate left facet hypertrophy. There is no spinal canal stenosis. Mild left neural foraminal stenosis. C4-5: Small disc bulge. There is no spinal canal stenosis. No neural foraminal stenosis. C5-6: Intermediate sized disc bulge with right-greater-than-left uncovertebral hypertrophy. Mild spinal canal stenosis. Severe right and mild left neural foraminal stenosis. C6-7: Small disc bulge. There is no spinal canal stenosis. Moderate bilateral neural foraminal stenosis. C7-T1: Normal disc space and facet joints. There is no spinal canal stenosis. No neural foraminal stenosis. IMPRESSION: 1. Diffuse edema throughout the right-sided paraspinal muscles with areas of irregular contrast enhancement, possibly indicating myositis. This extends inferiorly as far as the upper thoracic spine. 2. Mild spinal canal stenosis and severe right neural foraminal stenosis at C5-6. 3. Moderate bilateral C6-7 neural foraminal stenosis. Electronically Signed   By: Deatra Robinson M.D.   On: 12/10/2023 23:57        Scheduled Meds:  atorvastatin  40 mg Oral Daily  baclofen  5 mg Oral BID   budesonide  0.5 mg Nebulization BID   busPIRone  10 mg Oral TID   cholestyramine light  4 g Oral BID   enoxaparin (LOVENOX) injection  40 mg Subcutaneous Q24H   feeding supplement  237 mL Oral TID BM   folic acid  1 mg Oral Daily   gabapentin  300 mg Oral QID   montelukast  10 mg Oral QHS   multivitamin with minerals  1 tablet Oral Daily   oxymetazoline  1 spray Each Nare BID   pantoprazole (PROTONIX) IV  40 mg Intravenous Q24H   thiamine  100 mg Oral Daily   Or   thiamine (VITAMIN B1) injection  100 mg Intravenous Daily   Continuous Infusions:   LOS: 7 days    Tresa Moore, MD [facility]   If 7PM-7AM, please contact night-coverage  12/11/2023, 3:51 PM

## 2023-12-11 NOTE — Plan of Care (Signed)

## 2023-12-11 NOTE — TOC Progression Note (Addendum)
 Transition of Care [facility]) - Progression Note    Patient Details  Name: Tami Hopkins MRN: [id number] Date of Birth: 08-02-1961  [facility] CM/SW Contact  Marlowe Sax, RN Phone Number: 12/11/2023, 12:39 PM  Clinical Narrative:     PASSR number [id number] E, no bed offers at this time, expanded the search       Expected Discharge Plan and Services                                               Social Determinants of Health (SDOH) Interventions SDOH Screenings   Food Insecurity: Patient Unable To Answer (12/05/2023)  Recent Concern: Food Insecurity - Food Insecurity Present (10/10/2023)  Housing: Patient Unable To Answer (12/05/2023)  Recent Concern: Housing - High Risk (10/09/2023)  Transportation Needs: Patient Unable To Answer (12/05/2023)  Utilities: Patient Unable To Answer (12/05/2023)  Depression (PHQ2-9): Low Risk  (11/21/2023)  Financial Resource Strain: Medium Risk (09/25/2022)   Received from [facility], [facility]  Physical Activity: Insufficiently Active (01/13/2021)   Received from [facility], [facility]  Social Connections: Socially Isolated (01/13/2021)   Received from [facility], [facility]  Stress: Stress Concern Present (01/13/2021)   Received from [facility], [facility]  Tobacco Use: High Risk (12/04/2023)    Readmission Risk Interventions     No data to display

## 2023-12-11 NOTE — Consult Note (Signed)
 PHARMACY CONSULT NOTE - ELECTROLYTES  Pharmacy Consult for Electrolyte Monitoring and Replacement   Recent Labs: Height: 5\' 8"  (172.7 cm) Weight: 94 kg (207 lb 3.7 oz) IBW/kg (Calculated) : 63.9 Estimated Creatinine Clearance: 85.1 mL/min (by C-G formula based on SCr of 0.71 mg/dL). Potassium (mmol/L)  Date Value  12/11/2023 3.0 (L)   Magnesium (mg/dL)  Date Value  96/01/5408 2.0   Calcium (mg/dL)  Date Value  81/19/1478 9.1   Albumin (g/dL)  Date Value  29/56/2130 2.9 (L)   Phosphorus (mg/dL)  Date Value  86/57/8469 2.8   Sodium (mmol/L)  Date Value  12/11/2023 135    Assessment  Tami Hopkins is a 63 y.o. female presenting with acute hypoxic respiratory failure,Unintentional opioid overdose  . PMH significant for HLD, chronic pain, PTSD, and GERD. Pharmacy has been consulted to monitor and replace electrolytes.  Having loose stools  MIVF:  Pertinent medications:  Diet: FL, may use ONS per protocol   Goal of Therapy: Electrolytes WNL  Plan:  K 3.0   Will order KCL 40 meq po Q4h x2 doses.   Note lasix discontinued on 2/18 Monitor daily with AM labs  Thank you for allowing pharmacy to be a part of this patient's care.  Bari Mantis PharmD Clinical Pharmacist 12/11/2023

## 2023-12-11 NOTE — Consult Note (Signed)
 WOC Nurse Consult Note: Reason for Consult: perineal skin breakdown Patient with excessive diarrhea, not responding to floor stock barrier cream  Wound type: irritant contact dermatitis  ICD-10 CM Codes for Irritant Dermatitis [id number] - Due to fecal, urinary or dual incontinence Pressure Injury POA:  Wound [id number]:[id number]  Drainage (amount, consistency, odor) none Periwound: intact  Dressing procedure/placement/frequency: Gerhardt's butt cream BID and PRN after episodes of incontinence   Re consult if needed, will not follow at this time. Thanks  Caela Huot [employer], RN,CWOCN, CNS, CWON-AP [phone])

## 2023-12-12 DIAGNOSIS — J9601 Acute respiratory failure with hypoxia: Secondary | ICD-10-CM | POA: Diagnosis not present

## 2023-12-12 LAB — BASIC METABOLIC PANEL
Anion gap: 10 (ref 5–15)
BUN: 13 mg/dL (ref 8–23)
CO2: 22 mmol/L (ref 22–32)
Calcium: 8.9 mg/dL (ref 8.9–10.3)
Chloride: 106 mmol/L (ref 98–111)
Creatinine, Ser: 0.59 mg/dL (ref 0.44–1.00)
GFR, Estimated: >60 mL/min (ref >60)
Glucose, Bld: 119 mg/dL — ABNORMAL HIGH (ref 70–99)
Potassium: 3.7 mmol/L (ref 3.5–5.1)
Sodium: 138 mmol/L (ref 135–145)

## 2023-12-12 MED ORDER — QUETIAPINE FUMARATE 25 MG PO TABS
100.0000 mg | ORAL_TABLET | Freq: Three times a day (TID) | ORAL | Status: DC
  Administered 2023-12-12 – 2023-12-13 (×2): 100 mg via ORAL
  Filled 2023-12-12 (×3): qty 4

## 2023-12-12 MED ORDER — CLONAZEPAM 1 MG PO TABS
1.0000 mg | ORAL_TABLET | Freq: Two times a day (BID) | ORAL | Status: DC | PRN
  Administered 2023-12-13 – 2024-01-01 (×23): 1 mg via ORAL
  Filled 2023-12-12 (×23): qty 1

## 2023-12-12 MED ORDER — PANTOPRAZOLE SODIUM 40 MG PO TBEC
40.0000 mg | DELAYED_RELEASE_TABLET | Freq: Every day | ORAL | Status: DC
  Administered 2023-12-13 – 2024-01-02 (×12): 40 mg via ORAL
  Filled 2023-12-12 (×20): qty 1

## 2023-12-12 MED ORDER — QUETIAPINE FUMARATE 25 MG PO TABS: 100.0000 mg | ORAL_TABLET | ORAL | Status: DC

## 2023-12-12 MED ORDER — QUETIAPINE FUMARATE 25 MG PO TABS: 200.0000 mg | ORAL_TABLET | Freq: Every day | ORAL | Status: DC

## 2023-12-12 MED ORDER — PRAZOSIN HCL 1 MG PO CAPS
1.0000 mg | ORAL_CAPSULE | Freq: Every day | ORAL | Status: DC
  Administered 2023-12-12 – 2024-01-01 (×21): 1 mg via ORAL
  Filled 2023-12-12 (×21): qty 1

## 2023-12-12 NOTE — Progress Notes (Signed)
 Occupational Therapy Treatment Patient Details Name: Tami Hopkins MRN: [id number] DOB: 1961-06-09 Today's Date: 12/12/2023   History of present illness This is a 63 year old woman with a history of hyperlipidemia, COPD, asthma, tobacco use, chronic pain syndrome, anxiety, PTSD, IBS, and GERD who presented to the ED for unresponsiveness after being found down at home, possibly for up to 2 days, requiring intubation and admission to the ICU. Etiology favored to be narcotic overdose or polypharmacy   OT comments  Chart reviewed to date, pt greeted sitting on edge of bed, agreeable to participation with encouragement. Pt is oriented to self and place, poor situational awareness and awareness of current level of functioning/deficits. Pt is labile throughout. Pt is making progress towards goals, educated on importance of continued participation in therapy efforts to facilitate return to PLOF. OT will continue to follow.       If plan is discharge home, recommend the following:  A lot of help with bathing/dressing/bathroom;A lot of help with walking and/or transfers   Equipment Recommendations  Other (comment) (defer)    Recommendations for Other Services      Precautions / Restrictions Precautions Precautions: Fall Recall of Precautions/Restrictions: Impaired Restrictions Weight Bearing Restrictions Per Provider Order: No       Mobility Bed Mobility Overal bed mobility: Needs Assistance Bed Mobility: Sit to Supine       Sit to supine: Max assist (assist for BLE)        Transfers Overall transfer level: Needs assistance Equipment used: Rolling walker (2 wheels) Transfers: Sit to/from Stand Sit to Stand: Min assist, Mod assist (multiple attempts)           General transfer comment: three lateral steps up the bed with MIN A +1 with RW     Balance Overall balance assessment: Needs assistance Sitting-balance support: Feet supported Sitting balance-Leahy Scale: Good      Standing balance support: Bilateral upper extremity supported, During functional activity, Reliant on assistive device for balance Standing balance-Leahy Scale: Fair                             ADL either performed or assessed with clinical judgement   ADL Overall ADL's : Needs assistance/impaired Eating/Feeding: Set up;Sitting   Grooming: Wash/dry face;Sitting;Set up Grooming Details (indicate cue type and reason): MAX A for hair management, discussed w patient need to wash hair or it will be at risk for knots that do not come out                                    Extremity/Trunk Assessment Upper Extremity Assessment RUE Deficits / Details: Continued RUE deficits noted- AROM: can shrug shoulders; shoulder flexion/extension unable to perform, elbow approx 1/4 full AROM, wrist approx 1/2 full AROM, able to open/close hand with weak grip strength, wiggle fingers; PROM grossly WFL however pt grimacing and guarding throughout attempts, will continue to assess;            Vision       Perception     Praxis     Communication Communication Communication: No apparent difficulties   Cognition Arousal: Alert Behavior During Therapy: Lability Cognition: Cognition impaired   Orientation impairments: Time, Situation Awareness: Intellectual awareness impaired, Online awareness impaired Memory impairment (select all impairments): Short-term memory, Declarative long-term memory Attention impairment (select first level of impairment): Sustained attention Executive functioning  impairment (select all impairments): Problem solving, Reasoning OT - Cognition Comments: poor awareness of current deficits, level of function                 Following commands: Impaired Following commands impaired: Follows one step commands with increased time      Cueing   Cueing Techniques: Verbal cues, Gestural cues, Tactile cues, Visual cues  Exercises Other  Exercises Other Exercises: edu re: role of OT, role of rehab, importance of continued attempts with therapy    Shoulder Instructions       General Comments breakdown noted in peri area    Pertinent Vitals/ Pain       Pain Assessment Pain Assessment: No/denies pain  Home Living                                          Prior Functioning/Environment              Frequency  Min 1X/week        Progress Toward Goals  OT Goals(current goals can now be found in the care plan section)  Progress towards OT goals: Progressing toward goals  Acute Rehab OT Goals Time For Goal Achievement: 12/24/23  Plan      Co-evaluation                 AM-PAC OT "6 Clicks" Daily Activity     Outcome Measure   Help from another person eating meals?: None Help from another person taking care of personal grooming?: None Help from another person toileting, which includes using toliet, bedpan, or urinal?: A Lot Help from another person bathing (including washing, rinsing, drying)?: A Lot Help from another person to put on and taking off regular upper body clothing?: A Little Help from another person to put on and taking off regular lower body clothing?: A Lot 6 Click Score: 17    End of Session Equipment Utilized During Treatment: Rolling walker (2 wheels)  OT Visit Diagnosis: Other abnormalities of gait and mobility (R26.89);Muscle weakness (generalized) (M62.81)   Activity Tolerance Patient tolerated treatment well (limited by cognition)   Patient Left in bed;with call bell/phone within reach;with bed alarm set   Nurse Communication Mobility status        Time: 1440-1454 OT Time Calculation (min): 14 min  Charges: OT General Charges $OT Visit: 1 Visit OT Treatments $Therapeutic Activity: 8-22 mins  Oleta Mouse, OTD OTR/L  12/12/23, 3:37 PM

## 2023-12-12 NOTE — Progress Notes (Signed)
 PROGRESS NOTE    Tami Hopkins  ZOX:[id number] DOB: 03/03/1961 DOA: 12/04/2023 PCP: Ardyth Man, PA-C    Brief Narrative:   Tami Hopkins is a 63 y.o. female with a PMH significant for HLD, COPD, Asthma, Tobacco use, Chronic pain syndrome, Anxiety, PTSD, IBS, and GERD who presents to the ED for altered mental status.  Per chart review, family had not heard from the patient for the past 2 days and called for a wellness check.  Patient subsequently found unresponsive sitting on the couch, roommate stated that they thought patient had been sleeping on the couch for the past few days.  Of note, patient was admitted to the hospital a couple of months ago for altered mental status due to suspected polypharmacy use.  Upon EMS arrival patient was given 2 mg of intranasal Narcan with no effect, subsequently given 2 mg IV Narcan with slight respiratory improvement, yet remained unresponsive.  Upon arrival to the ER, BP 94/78, afebrile, 100% on room air, she was noted to have vomit in her airway during suctioning. She was intubated for airway protection. Labs revealed K 6.0, Creat 3.72, BUN 62, AST 162, ALT 85, CK 4155, Troponin 91 and 74, lactic acid 1.6, WBC 19.0, Drug screen + benzodiazepine, opiate, tricyclics. She was given a total of 3 liters of IVF, started on broad spectrum antibiotics, cultures sent, and started on Norepinephrine infusion. CT c-spine without noted fracture, CT head without acute pathology, CT A/P/T bilateral atelectasis, stable non obstructing right renal calculi, hepatic steatosis, aortic atherosclerosis, no acute intra abdominal process.     PCCM was consulted for Medical Management in this critically ill patient in septic shock and respiratory failure suspected due to polysubstance abuse and aspiration Pneumonia   2/12: Admitted with AMS due to polysubstance abuse. Septic shock, levophed, cultures sent, intubated  02/13: Brainstem reflexes improved today with regain of  cough, gag, pupils reactive.  02/14: Improved mental status but not yet following commands.  02/16: Extubated to nasal cannula.  With right sided weakness in upper and lower extremity and facial twitches. Febrile to 101. Neurology is consulted.  2/18: [facility] assumed care 2/20: Mental status waxes and wanes.  Delirium noted last night   Assessment & Plan:   Principal Problem:   Acute hypoxic respiratory failure (HCC) Active Problems:   Rhabdomyolysis   Aspiration pneumonia (HCC)   AKI (acute kidney injury) (HCC)   Chronic obstructive pulmonary disease (HCC)   Chronic pain syndrome   Post-traumatic stress disorder   Gastroesophageal reflux disease with esophagitis   High cholesterol   Moderate persistent asthma without complication   Tobacco use disorder   Acute respiratory failure (HCC)  # Unintentional opioid overdose # Opioid use disorder # Chronic pain Denies intentional overdose, denies SI - will need close outpatient f/u with pain medicine and plan to wean off opioids vs suboxone/subutex or other OUD treatment - continue home prn oxy for now - cont home gabapentin, baclofen (baclofen at reduced dose)  # Acute mebabolic encephalopathy Noted to be more confused on 2/20, wandering, removing IV access Neuro workup reassuring Plan: Restart home psych regimen with pharmacy assistance Frequent reorienting measures   # GAD - cont klonopin - cont home buspar   # Hypokalemia - replete and monitor   # AKI Prerenal 2/2 overdose, resolved   # Loose stool Resolved - will d/c stool softeners and monitor   # Aspiration pneumonia # Acute hypoxic respiratory failure Extubated and now breathing comfortably on room  air, most recent cxr no infiltrates. Finished a course of IV abx in the ICU. MRI nothing acute, EEG no seizure - monitor   # Debility PT advising SNF, TOC is aware and pursuing a bed  # Facial twitching # Right upper extremity weakness #  Myositis/rhabdomyolysis Patient with severe and persistent right upper extremity weakness.  MRI brain and cervical spine overall unrevealing.  Patient does have diffuse soft tissue edema on right shoulder concerning for myositis.  CK has normalized. Plan: Upper extremity compartment syndrome is felt unlikely.  Patient has palpable pulses on right upper extremity.  I suspect myositis secondary to rhabdomyolysis from prolonged immobility.  Also possibly contribution from statin induced myopathy.  Statin is held and patient is encouraged to work regularly with physical therapy.     DVT prophylaxis: SQ lovenox Code Status: FULL Family Communication:Daughter via phone 2/19 Disposition Plan: Status is: Inpatient Remains inpatient appropriate because: Weakness decreased from baseline.  Resolving encephalopathy.  Will need SNF   Level of care: Med-Surg  Consultants:  None  Procedures:  None  Antimicrobials: None    Subjective: Seen and examined.  Confusion persistent, maybe slightly improved  Objective: Vitals:   12/11/23 1544 12/11/23 2214 12/12/23 0500 12/12/23 0741  BP: 118/79 (!) 137/95  126/79  Pulse: 89 85  94  Resp: 17 18  16   Temp: 98.5 F (36.9 C) 98.3 F (36.8 C)  97.9 F (36.6 C)  TempSrc:      SpO2: 97% 96%  94%  Weight:   89.1 kg   Height:        Intake/Output Summary (Last 24 hours) at 12/12/2023 1417 Last data filed at 12/11/2023 1445 Gross per 24 hour  Intake --  Output 1000 ml  Net -1000 ml   Filed Weights   12/10/23 0500 12/11/23 0500 12/12/23 0500  Weight: 90.5 kg 94 kg 89.1 kg    Examination:  General exam: Appears weak and fatigued Respiratory system: Clear to auscultation. Respiratory effort normal. Cardiovascular system: S1S2, RRR, no murmur Gastrointestinal system: Soft, NT/ND, normal bowel sounds Central nervous system: Alert and oriented x 2.  No focal deficits Extremities: Right upper extremity weak Skin: No rashes, lesions or  ulcers Psychiatry: Judgement and insight appear impaired. Mood & affect flattened/confused.     Data Reviewed: I have personally reviewed following labs and imaging studies  CBC: Recent Labs  Lab 12/07/23 0345 12/08/23 0540 12/09/23 0449 12/10/23 0648 12/11/23 0606  WBC 15.5* 11.5* 12.3* 10.8* 11.6*  NEUTROABS  --   --   --   --  8.1*  HGB 12.3 10.8* 11.2* 12.7 12.9  HCT 37.5 33.4* 33.8* 37.1 38.9  MCV 96.2 95.7 92.9 90.7 93.7  PLT 160 158 170 206 243   Basic Metabolic Panel: Recent Labs  Lab 12/06/23 1254 12/06/23 1706 12/07/23 0345 12/07/23 1701 12/08/23 0540 12/08/23 1158 12/09/23 0449 12/10/23 0648 12/10/23 0822 12/11/23 0606 12/12/23 0805  NA  --   --  147*  --  152*  --  141  --  136 135 138  K  --   --  3.9  --  2.9* 3.5 3.1*  --  3.1* 3.0* 3.7  CL  --   --  114*  --  116*  --  105  --  100 103 106  CO2  --   --  22  --  26  --  25  --  24 21* 22  GLUCOSE  --   --  120*  --  125*  --  115*  --  117* 193* 119*  BUN  --   --  36*  --  33*  --  20  --  21 19 13   CREATININE  --   --  0.74  --  0.63  --  0.51  --  0.60 0.71 0.59  CALCIUM  --   --  8.8*  --  8.7*  --  8.6*  --  9.0 9.1 8.9  MG 2.2 2.1 2.2 1.9  --   --   --  2.0  --   --   --   PHOS 1.5* 1.5* 2.1* 2.3*  --   --   --   --  2.8  --   --    GFR: Estimated Creatinine Clearance: 83 mL/min (by C-G formula based on SCr of 0.59 mg/dL). Liver Function Tests: No results for input(s): "AST", "ALT", "ALKPHOS", "BILITOT", "PROT", "ALBUMIN" in the last 168 hours.  No results for input(s): "LIPASE", "AMYLASE" in the last 168 hours. No results for input(s): "AMMONIA" in the last 168 hours.  Coagulation Profile: No results for input(s): "INR", "PROTIME" in the last 168 hours.  Cardiac Enzymes: Recent Labs  Lab 12/05/23 1456 12/06/23 1201 12/11/23 0606  CKTOTAL 2,547* 1,573* 122   BNP (last 3 results) No results for input(s): "PROBNP" in the last 8760 hours. HbA1C: No results for input(s): "HGBA1C"  in the last 72 hours. CBG: Recent Labs  Lab 12/09/23 1207 12/09/23 2010 12/09/23 2322 12/10/23 0413 12/10/23 0831  GLUCAP 135* 131* 116* 117* 108*   Lipid Profile: No results for input(s): "CHOL", "HDL", "LDLCALC", "TRIG", "CHOLHDL", "LDLDIRECT" in the last 72 hours. Thyroid Function Tests: No results for input(s): "TSH", "T4TOTAL", "FREET4", "T3FREE", "THYROIDAB" in the last 72 hours. Anemia Panel: Recent Labs    12/11/23 0606  VITAMINB12 528   Sepsis Labs: Recent Labs  Lab 12/08/23 0540  PROCALCITON 0.30    Recent Results (from the past 240 hours)  Culture, blood (routine x 2)     Status: None   Collection Time: 12/04/23  1:43 PM   Specimen: BLOOD RIGHT HAND  Result Value Ref Range Status   Specimen Description BLOOD RIGHT HAND  Final   Special Requests   Final    BOTTLES DRAWN AEROBIC AND ANAEROBIC Blood Culture results may not be optimal due to an inadequate volume of blood received in culture bottles   Culture   Final    NO GROWTH 5 DAYS Performed at [facility], [address] [place], [place] [address]    Report Status 12/09/2023 FINAL  Final  Culture, blood (routine x 2)     Status: None   Collection Time: 12/04/23  1:43 PM   Specimen: BLOOD RIGHT ARM  Result Value Ref Range Status   Specimen Description BLOOD RIGHT ARM  Final   Special Requests   Final    BOTTLES DRAWN AEROBIC AND ANAEROBIC Blood Culture results may not be optimal due to an inadequate volume of blood received in culture bottles   Culture   Final    NO GROWTH 5 DAYS Performed at [facility], [address] [place], [place] [address]    Report Status 12/09/2023 FINAL  Final  MRSA Next Gen by PCR, Nasal     Status: None   Collection Time: 12/04/23 11:42 PM   Specimen: Nasopharyngeal Swab; Nasal Swab  Result Value Ref Range Status   MRSA by PCR Next Gen NOT DETECTED NOT DETECTED  Final    Comment: (NOTE) The GeneXpert MRSA Assay (FDA approved for NASAL specimens  only), is one component of a comprehensive MRSA colonization surveillance program. It is not intended to diagnose MRSA infection nor to guide or monitor treatment for MRSA infections. Test performance is not FDA approved in patients less than 25 years old. Performed at [facility], [address] [place], [place] [address]   Respiratory (~20 pathogens) panel by PCR     Status: None   Collection Time: 12/04/23 11:42 PM   Specimen: Nasopharyngeal Swab; Respiratory  Result Value Ref Range Status   Adenovirus NOT DETECTED NOT DETECTED Final   Coronavirus 229E NOT DETECTED NOT DETECTED Final    Comment: (NOTE) The Coronavirus on the Respiratory Panel, DOES NOT test for the novel  Coronavirus (2019 nCoV)    Coronavirus HKU1 NOT DETECTED NOT DETECTED Final   Coronavirus NL63 NOT DETECTED NOT DETECTED Final   Coronavirus OC43 NOT DETECTED NOT DETECTED Final   Metapneumovirus NOT DETECTED NOT DETECTED Final   Rhinovirus / Enterovirus NOT DETECTED NOT DETECTED Final   Influenza A NOT DETECTED NOT DETECTED Final   Influenza B NOT DETECTED NOT DETECTED Final   Parainfluenza Virus 1 NOT DETECTED NOT DETECTED Final   Parainfluenza Virus 2 NOT DETECTED NOT DETECTED Final   Parainfluenza Virus 3 NOT DETECTED NOT DETECTED Final   Parainfluenza Virus 4 NOT DETECTED NOT DETECTED Final   Respiratory Syncytial Virus NOT DETECTED NOT DETECTED Final   Bordetella pertussis NOT DETECTED NOT DETECTED Final   Bordetella Parapertussis NOT DETECTED NOT DETECTED Final   Chlamydophila pneumoniae NOT DETECTED NOT DETECTED Final   Mycoplasma pneumoniae NOT DETECTED NOT DETECTED Final    Comment: Performed at [facility], [address] [place], [place] [address]  Culture, Respiratory w Gram Stain     Status: None   Collection Time: 12/06/23  3:11 PM   Specimen: Tracheal Aspirate  Result Value Ref Range Status   Specimen Description   Final    TRACHEAL ASPIRATE Performed at [facility], [address], [place], [place] [address]    Special Requests   Final    NONE Performed at [facility], [address] [place], [place] [address]    Gram Stain   Final    NO WBC SEEN NO ORGANISMS SEEN Performed at [facility], [address] [place], [place] [address]    Culture FEW CANDIDA ALBICANS  Final   Report Status 12/09/2023 FINAL  Final  Culture, blood (Routine X 2) w Reflex to ID Panel     Status: None (Preliminary result)   Collection Time: 12/08/23  9:42 AM   Specimen: BLOOD  Result Value Ref Range Status   Specimen Description BLOOD BLOOD LEFT HAND  Final   Special Requests   Final    BOTTLES DRAWN AEROBIC AND ANAEROBIC Blood Culture results may not be optimal due to an inadequate volume of blood received in culture bottles   Culture   Final    NO GROWTH 4 DAYS Performed at [facility], [address] [place], [place] [address]    Report Status PENDING  Incomplete  Culture, blood (Routine X 2) w Reflex to ID Panel     Status: None (Preliminary result)   Collection Time: 12/08/23  9:42 AM   Specimen: BLOOD  Result Value Ref Range Status   Specimen Description BLOOD BLOOD RIGHT HAND  Final   Special Requests  Final    BOTTLES DRAWN AEROBIC AND ANAEROBIC Blood Culture results may not be optimal due to an inadequate volume of blood received in culture bottles   Culture   Final    NO GROWTH 4 DAYS Performed at [facility], [address] [place], [place] [address]    Report Status PENDING  Incomplete  Gastrointestinal Panel by PCR , Stool     Status: None   Collection Time: 12/11/23  6:52 PM   Specimen: Stool  Result Value Ref Range Status   Campylobacter species NOT DETECTED NOT DETECTED Final   Plesimonas shigelloides NOT DETECTED NOT DETECTED Final   Salmonella species NOT DETECTED NOT DETECTED Final   Yersinia enterocolitica NOT DETECTED NOT DETECTED Final   Vibrio species NOT DETECTED NOT DETECTED  Final   Vibrio cholerae NOT DETECTED NOT DETECTED Final   Enteroaggregative E coli (EAEC) NOT DETECTED NOT DETECTED Final   Enteropathogenic E coli (EPEC) NOT DETECTED NOT DETECTED Final   Enterotoxigenic E coli (ETEC) NOT DETECTED NOT DETECTED Final   Shiga like toxin producing E coli (STEC) NOT DETECTED NOT DETECTED Final   Shigella/Enteroinvasive E coli (EIEC) NOT DETECTED NOT DETECTED Final   Cryptosporidium NOT DETECTED NOT DETECTED Final   Cyclospora cayetanensis NOT DETECTED NOT DETECTED Final   Entamoeba histolytica NOT DETECTED NOT DETECTED Final   Giardia lamblia NOT DETECTED NOT DETECTED Final   Adenovirus F40/41 NOT DETECTED NOT DETECTED Final   Astrovirus NOT DETECTED NOT DETECTED Final   Norovirus GI/GII NOT DETECTED NOT DETECTED Final   Rotavirus A NOT DETECTED NOT DETECTED Final   Sapovirus (I, II, IV, and V) NOT DETECTED NOT DETECTED Final    Comment: Performed at [facility], [address], [place], [place] [address]         Radiology Studies: MR BRACHIAL PLEXUS W WO CM RT Result Date: 12/11/2023 CLINICAL DATA:  Right upper extremity weakness. History of polysubstance abuse. Elevated CK. EXAM: MRI BRACHIAL PLEXUS WITHOUT AND WITH CONTRAST TECHNIQUE: Multiplanar, multiecho pulse sequences of the neck and surrounding structures were obtained without and with intravenous contrast. The field of view was focused on the right brachial plexus from the neural foramina to the axilla. CONTRAST:  10mL GADAVIST GADOBUTROL 1 MMOL/ML IV SOLN COMPARISON:  MRI cervical spine from same day. FINDINGS: Spinal cord Normal caliber and signal of visualized spinal cord. Brachial plexus Roots: Normal. Trunks: Normal. Divisions: Normal. Cords: Normal. Branches: Normal. Muscles and tendons Prominent edema and enhancement in the right paraspinous muscles extending from C4 to T3. Diffuse low-level muscle edema in the right infraspinatus and to a lesser extent the supraspinatus muscles. No  significant muscle atrophy. Bones Cervical spine: Please see separate MRI cervical spine report from same day. C7 transverse processes: Normal. Marrow signal: Normal. Other findings None. IMPRESSION: 1. Prominent edema and enhancement in the right paraspinous muscles extending from C4 to T3. This is nonspecific but may reflect rhabdomyolysis given clinical history. 2. Diffuse low-level muscle edema in the right infraspinatus and to a lesser extent the supraspinatus muscles, suspicious for denervation related change (acute brachial neuritis). No muscle atrophy. 3. Normal appearance of the right brachial plexus. Electronically Signed   By: Obie Dredge M.D.   On: 12/11/2023 08:59   MR CERVICAL SPINE W WO CONTRAST Result Date: 12/10/2023 CLINICAL DATA:  Right-sided arm weakness EXAM: MRI CERVICAL SPINE WITHOUT AND WITH CONTRAST TECHNIQUE: Multiplanar and multiecho pulse sequences of the cervical spine, to include the craniocervical junction and cervicothoracic junction,  were obtained without and with intravenous contrast. CONTRAST:  10mL GADAVIST GADOBUTROL 1 MMOL/ML IV SOLN COMPARISON:  None Available. FINDINGS: Alignment: Physiologic. Vertebrae: No fracture, evidence of discitis, or bone lesion. Cord: Normal signal and morphology. Posterior Fossa, vertebral arteries, paraspinal tissues: There is diffuse edema throughout the right-sided paraspinal muscles. This is associated with areas of irregular contrast enhancement, possibly indicating myositis. This extends inferiorly as far as the upper thoracic spine. Disc levels: C1-2: Unremarkable. C2-3: Normal disc space and facet joints. There is no spinal canal stenosis. No neural foraminal stenosis. C3-4: Moderate left facet hypertrophy. There is no spinal canal stenosis. Mild left neural foraminal stenosis. C4-5: Small disc bulge. There is no spinal canal stenosis. No neural foraminal stenosis. C5-6: Intermediate sized disc bulge with right-greater-than-left  uncovertebral hypertrophy. Mild spinal canal stenosis. Severe right and mild left neural foraminal stenosis. C6-7: Small disc bulge. There is no spinal canal stenosis. Moderate bilateral neural foraminal stenosis. C7-T1: Normal disc space and facet joints. There is no spinal canal stenosis. No neural foraminal stenosis. IMPRESSION: 1. Diffuse edema throughout the right-sided paraspinal muscles with areas of irregular contrast enhancement, possibly indicating myositis. This extends inferiorly as far as the upper thoracic spine. 2. Mild spinal canal stenosis and severe right neural foraminal stenosis at C5-6. 3. Moderate bilateral C6-7 neural foraminal stenosis. Electronically Signed   By: Deatra Robinson M.D.   On: 12/10/2023 23:57        Scheduled Meds:  atorvastatin  40 mg Oral Daily   baclofen  5 mg Oral BID   budesonide  0.5 mg Nebulization BID   busPIRone  10 mg Oral TID   cholestyramine light  4 g Oral BID   enoxaparin (LOVENOX) injection  40 mg Subcutaneous Q24H   feeding supplement  237 mL Oral TID BM   folic acid  1 mg Oral Daily   gabapentin  300 mg Oral QID   Gerhardt's butt cream   Topical BID   montelukast  10 mg Oral QHS   multivitamin with minerals  1 tablet Oral Daily   pantoprazole  40 mg Oral Daily   prazosin  1 mg Oral QHS   QUEtiapine  100 mg Oral TID   thiamine  100 mg Oral Daily   Or   thiamine (VITAMIN B1) injection  100 mg Intravenous Daily   Continuous Infusions:   LOS: 8 days    Tresa Moore, MD [facility]   If 7PM-7AM, please contact night-coverage  12/12/2023, 2:17 PM

## 2023-12-12 NOTE — Progress Notes (Addendum)
 Patient hallucinating this AM and actively talking with people not in the room. Pt also pulled out her IV stating "I don't need that". Will attempt to put IV back when patient is more calm. MD aware and will assess patient when he arrives.   10:35 - MD states may leave IV out. Pt is currently A&O x3 at this time and does not appear to be hallucinating.

## 2023-12-12 NOTE — Consult Note (Signed)
 PHARMACY CONSULT NOTE - ELECTROLYTES  Pharmacy Consult for Electrolyte Monitoring and Replacement   Recent Labs: Height: 5\' 8"  (172.7 cm) Weight: 89.1 kg (196 lb 6.9 oz) IBW/kg (Calculated) : 63.9 Estimated Creatinine Clearance: 83 mL/min (by C-G formula based on SCr of 0.59 mg/dL). Potassium (mmol/L)  Date Value  12/12/2023 3.7   Magnesium (mg/dL)  Date Value  16/07/9603 2.0   Calcium (mg/dL)  Date Value  54/06/8118 8.9   Albumin (g/dL)  Date Value  14/78/2956 2.9 (L)   Phosphorus (mg/dL)  Date Value  21/30/8657 2.8   Sodium (mmol/L)  Date Value  12/12/2023 138    Assessment  Tami Hopkins is a 63 y.o. female presenting with acute hypoxic respiratory failure,Unintentional opioid overdose  . PMH significant for HLD, chronic pain, PTSD, and GERD. Pharmacy has been consulted to monitor and replace electrolytes.  Having loose stools  MIVF: N/A Pertinent medications: MVI, Thiamine 100 mg daily Diet: Regular, ONS  Goal of Therapy: Electrolytes WNL  Plan:  No electrolyte replacement currently warranted Check electrolytes with next AM labs  Thank you for allowing pharmacy to be a part of this patient's care.  Will M. Dareen Piano, PharmD Clinical Pharmacist 12/12/2023 10:48 AM

## 2023-12-12 NOTE — Progress Notes (Addendum)
 Nutrition Follow-up  DOCUMENTATION CODES:   Not applicable  INTERVENTION:   -Continue regular diet -Continue MVI with minerals daily -Continue Ensure Enlive po TID, each supplement provides 350 kcal and 20 grams of protein  NUTRITION DIAGNOSIS:   Inadequate oral intake related to inability to eat (pt sedated and ventilated) as evidenced by NPO status.  Progressing; advanced to PO diet on 2/16  GOAL:   Patient will meet greater than or equal to 90% of their needs  Progressing   MONITOR:   PO intake, Supplement acceptance, Labs, Weight trends, I & O's, Skin  REASON FOR ASSESSMENT:   Consult Enteral/tube feeding initiation and management  ASSESSMENT:   63 y.o. female with h/o HLD, COPD, asthma, tobacco use, DDD. chronic pain syndrome, anxiety, PTSD, IBS and GERD who is admitted with polypharmacy, OD, rhabdomyolysis, aspiration PNA and AKI.  2/16- extubated, advanced to full liquid diet 2/17- rectal tube removed 2/18- advanced to regular diet  Reviewed I/O's: -1 L x 24 hours and -3.6 L since admission  Per [facility] notes, pt with IAD to perineum from excessive diarrhea.   Neurology signed off on 12/10/22; no evidence of stroke. Suspect rhabdomyolysis related to polypharmacy.   Case discussed with RN, who reports mental status is improving. She is currently alert x 3. RN reports pt was confused ealrier, however, is improved. She ate 100% of her breakfast this AM and is also drinking Ensure supplements.   No wt loss noted over the past week.  Medications reviewed and include folic acid, neurontin, protonix, and thiamine.  Per TOC notes, awaiting SNF placement for discharge.   No results found for: "HGBA1C" PTA DM medications are .   Labs reviewed: CBGS: 108 (inpatient orders for glycemic control are none).    Diet Order:   Diet Order             Diet regular Room service appropriate? Yes; Fluid consistency: Thin  Diet effective now                    EDUCATION NEEDS:   No education needs have been identified at this time  Skin:  Skin Assessment: Reviewed RN Assessment  Last BM:  2/17- via rectal tube  Height:   Ht Readings from Last 1 Encounters:  12/04/23 5\' 8"  (1.727 m)    Weight:   Wt Readings from Last 1 Encounters:  12/12/23 89.1 kg    Ideal Body Weight:  70 kg  BMI:  Body mass index is 29.87 kg/m.  Estimated Nutritional Needs:   Kcal:  1900-2200kcal/day  Protein:  95-110g/day  Fluid:  1.9-2.2L/day    Levada Schilling, RD, LDN, CDCES Registered Dietitian III Certified Diabetes Care and Education Specialist If unable to reach this RD, please use "RD Inpatient" group chat on secure chat between hours of 8am-4 pm daily

## 2023-12-12 NOTE — Progress Notes (Signed)
 PT Cancellation Note  Patient Details Name: Tami Hopkins MRN: [id number] DOB: 1961-08-21   Cancelled Treatment:    Reason Eval/Treat Not Completed: Other (comment). Pt sitting EOB with family at bedside, adamantly refusing any mobility at this time due to eating and visiting with family. Despite education on importance of mobility and discussion of working with PT with MD, pt declining today. PT to re-attempt as able.    Olga Coaster PT, DPT 2:30 PM,12/12/23

## 2023-12-13 DIAGNOSIS — J9601 Acute respiratory failure with hypoxia: Secondary | ICD-10-CM | POA: Diagnosis not present

## 2023-12-13 LAB — CULTURE, BLOOD (ROUTINE X 2)
Culture: NO GROWTH
Culture: NO GROWTH

## 2023-12-13 LAB — BASIC METABOLIC PANEL
Anion gap: 7 (ref 5–15)
BUN: 11 mg/dL (ref 8–23)
CO2: 23 mmol/L (ref 22–32)
Calcium: 8.7 mg/dL — ABNORMAL LOW (ref 8.9–10.3)
Chloride: 108 mmol/L (ref 98–111)
Creatinine, Ser: 0.58 mg/dL (ref 0.44–1.00)
GFR, Estimated: >60 mL/min (ref >60)
Glucose, Bld: 112 mg/dL — ABNORMAL HIGH (ref 70–99)
Potassium: 3.6 mmol/L (ref 3.5–5.1)
Sodium: 138 mmol/L (ref 135–145)

## 2023-12-13 LAB — PHOSPHORUS: Phosphorus: 3.7 mg/dL (ref 2.5–4.6)

## 2023-12-13 LAB — MAGNESIUM: Magnesium: 2.2 mg/dL (ref 1.7–2.4)

## 2023-12-13 MED ORDER — FLUCONAZOLE 50 MG PO TABS
150.0000 mg | ORAL_TABLET | Freq: Once | ORAL | Status: AC
  Administered 2023-12-13: 150 mg via ORAL
  Filled 2023-12-13: qty 1

## 2023-12-13 MED ORDER — QUETIAPINE FUMARATE 200 MG PO TABS
200.0000 mg | ORAL_TABLET | Freq: Every day | ORAL | Status: DC
  Administered 2023-12-13 – 2024-01-01 (×20): 200 mg via ORAL
  Filled 2023-12-13 (×16): qty 1
  Filled 2023-12-13: qty 8
  Filled 2023-12-13 (×2): qty 1
  Filled 2023-12-13: qty 8
  Filled 2023-12-13 (×2): qty 1

## 2023-12-13 MED ORDER — ACETAMINOPHEN 325 MG PO TABS
650.0000 mg | ORAL_TABLET | Freq: Four times a day (QID) | ORAL | Status: DC | PRN
  Administered 2023-12-21 – 2024-01-02 (×9): 650 mg via ORAL
  Filled 2023-12-13 (×10): qty 2

## 2023-12-13 MED ORDER — NYSTATIN 100000 UNIT/GM EX POWD
Freq: Three times a day (TID) | CUTANEOUS | Status: DC
  Administered 2023-12-13: 1 g via TOPICAL
  Filled 2023-12-13 (×2): qty 15

## 2023-12-13 MED ORDER — QUETIAPINE FUMARATE 100 MG PO TABS
100.0000 mg | ORAL_TABLET | Freq: Every day | ORAL | Status: DC
Start: 2023-12-13 — End: 2024-01-02
  Administered 2023-12-13 – 2024-01-02 (×21): 100 mg via ORAL
  Filled 2023-12-13: qty 1
  Filled 2023-12-13: qty 4
  Filled 2023-12-13: qty 1
  Filled 2023-12-13: qty 4
  Filled 2023-12-13: qty 1
  Filled 2023-12-13: qty 4
  Filled 2023-12-13 (×3): qty 1
  Filled 2023-12-13: qty 4
  Filled 2023-12-13: qty 1
  Filled 2023-12-13: qty 4
  Filled 2023-12-13: qty 1
  Filled 2023-12-13: qty 4
  Filled 2023-12-13: qty 1
  Filled 2023-12-13 (×2): qty 4
  Filled 2023-12-13: qty 1
  Filled 2023-12-13 (×3): qty 4
  Filled 2023-12-13 (×6): qty 1
  Filled 2023-12-13 (×3): qty 4
  Filled 2023-12-13: qty 1
  Filled 2023-12-13: qty 4
  Filled 2023-12-13: qty 1
  Filled 2023-12-13 (×2): qty 4
  Filled 2023-12-13: qty 1

## 2023-12-13 NOTE — Progress Notes (Signed)
 Occupational Therapy Treatment Patient Details Name: Tami Hopkins MRN: [id number] DOB: 29-Apr-1961 Today's Date: 12/13/2023   History of present illness This is a 63 year old woman with a history of hyperlipidemia, COPD, asthma, tobacco use, chronic pain syndrome, anxiety, PTSD, IBS, and GERD who presented to the ED for unresponsiveness after being found down at home, possibly for up to 2 days, requiring intubation and admission to the ICU. Etiology favored to be narcotic overdose or polypharmacy   OT comments  Tami Hopkins seen for OT/PT co-treatment to maximize safety and progression of functional mobility. Upon arrival to room pt seated in recliner, MD in room preparing pt for therapy session and providing gentle encouragement. Pt requires intermittent encouragement form therapists t/o session to engage in functional activity but is agreeable t/o Tx session. OT facilitated ADL management as described below. See ADL section for additional details regarding occupational performance. Pt continues to be functionally limited by impaired cognition, decreased activity tolerance, and RUE weakness. Pt return verbalizes understanding of education provided t/o session but continues to benefit from reinforcement. Pt is progressing toward OT goals and continues to benefit from skilled OT services to maximize return to PLOF and minimize risk of future falls, injury, caregiver burden, and readmission. Will continue to follow POC as written. Discharge recommendation remains appropriate.        If plan is discharge home, recommend the following:  A little help with bathing/dressing/bathroom;A lot of help with walking and/or transfers   Equipment Recommendations   (defer)    Recommendations for Other Services      Precautions / Restrictions Precautions Precautions: Fall Recall of Precautions/Restrictions: Impaired Restrictions Weight Bearing Restrictions Per Provider Order: No       Mobility Bed  Mobility               General bed mobility comments: Nt pt in recliner pre/post session.    Transfers Overall transfer level: Needs assistance Equipment used: Rolling walker (2 wheels) Transfers: Sit to/from Stand Sit to Stand: Min assist           General transfer comment: MIN A for functional mobility in room and briefly in hall.     Balance   Sitting-balance support: Feet supported Sitting balance-Leahy Scale: Good     Standing balance support: Bilateral upper extremity supported, During functional activity, Reliant on assistive device for balance Standing balance-Leahy Scale: Fair                             ADL either performed or assessed with clinical judgement   ADL Overall ADL's : Needs assistance/impaired Eating/Feeding: Sitting;Set up;Supervision/ safety   Grooming: Sitting;Oral care;Set up;Supervision/safety Grooming Details (indicate cue type and reason): Just completed oral care while up in recliner upon OT arrival.             Lower Body Dressing: Moderate assistance;Sit to/from stand Lower Body Dressing Details (indicate cue type and reason): Self limiting, initially agreeable to attempt to don R hospital sock, but becomes labile immediately after task initiation and requests assist. Is able to reach down to ankle and pull sock up with her LUE after OT threads partially onto foot. Toilet Transfer: +2 for safety/equipment;BSC/3in1;Rolling walker (2 wheels);Minimal assistance           Functional mobility during ADLs: Cueing for safety;Minimal assistance;+2 for safety/equipment General ADL Comments: Pt able to perform functional mobility in room/hall with encouragement and re-assurance that both therapists were present and  would support if needed. She is able to grasp RW with her RUE and requires +1 MIN A during functional mobility for stability. +2 for safety/chair follow.    Extremity/Trunk Assessment              Vision  Baseline Vision/History: 1 Wears glasses Patient Visual Report: No change from baseline     Perception     Praxis     Communication Communication Communication: No apparent difficulties   Cognition Arousal: Alert Behavior During Therapy: Lability Cognition: Cognition impaired, No family/caregiver present to determine baseline             OT - Cognition Comments: cognition appears improved from previous therapy sessions, however, pt remains labile t/o session intermittently stating "I can't do it" but does engage in functional activity with gentle encouragment. MD in room for "pep talk" prior to therapy tx session, appears to have helped support active participation in care this date.                 Following commands: Impaired Following commands impaired: Follows one step commands with increased time      Cueing      Exercises Other Exercises Other Exercises: edu re: role of OT, role of rehab, importance of continued attempts with therapy, safe use of yellow theraband with basic RUE shoulder/elbow ROM exercises.    Shoulder Instructions       General Comments      Pertinent Vitals/ Pain       Pain Assessment Pain Assessment: No/denies pain  Home Living                                          Prior Functioning/Environment              Frequency  Min 1X/week        Progress Toward Goals  OT Goals(current goals can now be found in the care plan section)  Progress towards OT goals: Progressing toward goals  Acute Rehab OT Goals Patient Stated Goal: to go home to her dog OT Goal Formulation: With patient Time For Goal Achievement: 12/24/23 Potential to Achieve Goals: Fair  Plan      Co-evaluation                 AM-PAC OT "6 Clicks" Daily Activity     Outcome Measure   Help from another person eating meals?: None Help from another person taking care of personal grooming?: None Help from another person  toileting, which includes using toliet, bedpan, or urinal?: A Little Help from another person bathing (including washing, rinsing, drying)?: A Little Help from another person to put on and taking off regular upper body clothing?: A Little Help from another person to put on and taking off regular lower body clothing?: A Lot 6 Click Score: 19    End of Session Equipment Utilized During Treatment: Rolling walker (2 wheels)  OT Visit Diagnosis: Other abnormalities of gait and mobility (R26.89);Muscle weakness (generalized) (M62.81)   Activity Tolerance Patient tolerated treatment well   Patient Left in chair;with call bell/phone within reach;with family/visitor present   Nurse Communication Mobility status        Time: 1411-1440 OT Time Calculation (min): 29 min  Charges: OT General Charges $OT Visit: 1 Visit OT Treatments $Self Care/Home Management : 8-22 mins  Rockney Ghee, M.S., OTR/L 12/13/23, 3:23 PM

## 2023-12-13 NOTE — TOC Progression Note (Signed)
 Transition of Care [facility]) - Progression Note    Patient Details  Name: Tami Hopkins MRN: [id number] Date of Birth: 1961/07/30  [facility]) CM/SW Contact  Marlowe Sax, RN Phone Number: 12/13/2023, 11:44 AM  Clinical Narrative:     Patient does not have bed offers in [place], she will need to work with PT to make her getting a Rehab bed more likley, TOC will continue to follow and assist with DC       Expected Discharge Plan and Services                                               Social Determinants of Health (SDOH) Interventions SDOH Screenings   Food Insecurity: Patient Unable To Answer (12/05/2023)  Recent Concern: Food Insecurity - Food Insecurity Present (10/10/2023)  Housing: Patient Unable To Answer (12/05/2023)  Recent Concern: Housing - High Risk (10/09/2023)  Transportation Needs: Patient Unable To Answer (12/05/2023)  Utilities: Patient Unable To Answer (12/05/2023)  Depression (PHQ2-9): Low Risk  (11/21/2023)  Financial Resource Strain: Medium Risk (09/25/2022)   Received from [facility], [facility]  Physical Activity: Insufficiently Active (01/13/2021)   Received from [facility], [facility]  Social Connections: Socially Isolated (01/13/2021)   Received from [facility], [facility]  Stress: Stress Concern Present (01/13/2021)   Received from [facility], [facility]  Tobacco Use: High Risk (12/04/2023)    Readmission Risk Interventions     No data to display

## 2023-12-13 NOTE — Progress Notes (Signed)
 Physical Therapy Treatment Patient Details Name: MICHAELINE ECKERSLEY MRN: [id number] DOB: [birth year] Today's Date: 12/13/2023   History of Present Illness This is a 63 year old woman with a history of hyperlipidemia, COPD, asthma, tobacco use, chronic pain syndrome, anxiety, PTSD, IBS, and GERD who presented to the ED for unresponsiveness after being found down at home, possibly for up to 2 days, requiring intubation and admission to the ICU. Etiology favored to be narcotic overdose or polypharmacy    PT Comments  Pt seen with OT for therapist and pt safety and pt tolerance for activity. Discussed POC and current goals with pt. Pt required MinA to stand from recliner to RW. R hand assisted with gripping RW. Gait training ~19ft with close CG/MinA for balance due to ataxic gait, decreased R LE heel strike and overall weakness. Chair follow for safety. Pt fatigued with minimal distance returning to chair in room. Reviewed B LE strengthening exercises with good understanding. Pt states she plans to go to her daughter's 3rd floor apt. (With elevator) at time of d/c. Concerns for quick re-admit due to pt's decreased strength, awareness of deficits, and high fall risk. Continue to recommend STR at d/c.    If plan is discharge home, recommend the following: A little help with walking and/or transfers;A little help with bathing/dressing/bathroom;Assistance with cooking/housework;Assist for transportation;Help with stairs or ramp for entrance;Direct supervision/assist for financial management   Can travel by private vehicle     No  Equipment Recommendations  Other (comment) (TBD at next level of care)    Recommendations for Other Services       Precautions / Restrictions Precautions Precautions: Fall Recall of Precautions/Restrictions: Impaired Restrictions Weight Bearing Restrictions Per Provider Order: No     Mobility  Bed Mobility               General bed mobility comments: Nt pt in  recliner pre/post session.    Transfers Overall transfer level: Needs assistance Equipment used: Rolling walker (2 wheels) Transfers: Sit to/from Stand Sit to Stand: Min assist           General transfer comment: MIN A for functional mobility in room and briefly in hall.    Ambulation/Gait Ambulation/Gait assistance: Min assist Gait Distance (Feet): 40 Feet Assistive device: Rolling walker (2 wheels) Gait Pattern/deviations: Step-to pattern, Decreased step length - right, Decreased step length - left, Decreased dorsiflexion - right, Ataxic Gait velocity: decr     General Gait Details: Pt unsteady requiring close CGA/MinA at times for balance and safety   Stairs             Wheelchair Mobility     Tilt Bed    Modified Rankin (Stroke Patients Only)       Balance Overall balance assessment: Needs assistance Sitting-balance support: Feet supported Sitting balance-Leahy Scale: Good     Standing balance support: Bilateral upper extremity supported, During functional activity, Reliant on assistive device for balance Standing balance-Leahy Scale: Fair Standing balance comment: Fair static standing at RW, fall risk during dynamic mobility                            Communication Communication Communication: No apparent difficulties  Cognition Arousal: Alert Behavior During Therapy: Lability   PT - Cognitive impairments: No apparent impairments                       PT - Cognition Comments: Unaware of true  functional deficits Following commands: Impaired Following commands impaired: Follows one step commands with increased time    Cueing Cueing Techniques: Verbal cues, Gestural cues, Tactile cues, Visual cues  Exercises Other Exercises Other Exercises: edu re: role of PT, role of rehab, importance of continued attempts with therapy    General Comments General comments (skin integrity, edema, etc.): Pt's buttocks area very red with  open areas, nursing aware      Pertinent Vitals/Pain Pain Assessment Pain Assessment: No/denies pain    Home Living                          Prior Function            PT Goals (current goals can now be found in the care plan section) Acute Rehab PT Goals Patient Stated Goal: to feel better Progress towards PT goals: Progressing toward goals    Frequency    Min 1X/week      PT Plan      Co-evaluation              AM-PAC PT "6 Clicks" Mobility   Outcome Measure  Help needed turning from your back to your side while in a flat bed without using bedrails?: A Lot Help needed moving from lying on your back to sitting on the side of a flat bed without using bedrails?: A Lot Help needed moving to and from a bed to a chair (including a wheelchair)?: A Lot Help needed standing up from a chair using your arms (e.g., wheelchair or bedside chair)?: A Lot Help needed to walk in hospital room?: A Lot Help needed climbing 3-5 steps with a railing? : Total 6 Click Score: 11    End of Session Equipment Utilized During Treatment: Gait belt Activity Tolerance: Patient tolerated treatment well Patient left: in chair;with call bell/phone within reach;with chair alarm set Nurse Communication: Mobility status PT Visit Diagnosis: Other abnormalities of gait and mobility (R26.89);Difficulty in walking, not elsewhere classified (R26.2);Muscle weakness (generalized) (M62.81)     Time: 1610-9604 PT Time Calculation (min) (ACUTE ONLY): 30 min  Charges:    $Therapeutic Activity: 8-22 mins PT General Charges $$ ACUTE PT VISIT: 1 Visit                    Zadie Cleverly, PTA  Jannet Askew 12/13/2023, 3:58 PM

## 2023-12-13 NOTE — Progress Notes (Signed)
 PROGRESS NOTE    Tami Hopkins  ZOX:[id number] DOB: [birth year] DOA: 12/04/2023 PCP: Ardyth Man, PA-C    Brief Narrative:   Tami Hopkins is a 63 y.o. female with a PMH significant for HLD, COPD, Asthma, Tobacco use, Chronic pain syndrome, Anxiety, PTSD, IBS, and GERD who presents to the ED for altered mental status.  Per chart review, family had not heard from the patient for the past 2 days and called for a wellness check.  Patient subsequently found unresponsive sitting on the couch, roommate stated that they thought patient had been sleeping on the couch for the past few days.  Of note, patient was admitted to the hospital a couple of months ago for altered mental status due to suspected polypharmacy use.  Upon EMS arrival patient was given 2 mg of intranasal Narcan with no effect, subsequently given 2 mg IV Narcan with slight respiratory improvement, yet remained unresponsive.  Upon arrival to the ER, BP 94/78, afebrile, 100% on room air, she was noted to have vomit in her airway during suctioning. She was intubated for airway protection. Labs revealed K 6.0, Creat 3.72, BUN 62, AST 162, ALT 85, CK 4155, Troponin 91 and 74, lactic acid 1.6, WBC 19.0, Drug screen + benzodiazepine, opiate, tricyclics. She was given a total of 3 liters of IVF, started on broad spectrum antibiotics, cultures sent, and started on Norepinephrine infusion. CT c-spine without noted fracture, CT head without acute pathology, CT A/P/T bilateral atelectasis, stable non obstructing right renal calculi, hepatic steatosis, aortic atherosclerosis, no acute intra abdominal process.     PCCM was consulted for Medical Management in this critically ill patient in septic shock and respiratory failure suspected due to polysubstance abuse and aspiration Pneumonia   2/12: Admitted with AMS due to polysubstance abuse. Septic shock, levophed, cultures sent, intubated  02/13: Brainstem reflexes improved today with regain of  cough, gag, pupils reactive.  02/14: Improved mental status but not yet following commands.  02/16: Extubated to nasal cannula.  With right sided weakness in upper and lower extremity and facial twitches. Febrile to 101. Neurology is consulted.  2/18: [facility] assumed care 2/20: Mental status waxes and wanes.  Delirium noted last night   Assessment & Plan:   Principal Problem:   Acute hypoxic respiratory failure (HCC) Active Problems:   Rhabdomyolysis   Aspiration pneumonia (HCC)   AKI (acute kidney injury) (HCC)   Chronic obstructive pulmonary disease (HCC)   Chronic pain syndrome   Post-traumatic stress disorder   Gastroesophageal reflux disease with esophagitis   High cholesterol   Moderate persistent asthma without complication   Tobacco use disorder   Acute respiratory failure (HCC)  # Unintentional opioid overdose # Opioid use disorder # Chronic pain Denies intentional overdose, denies SI - will need close outpatient f/u with pain medicine and plan to wean off opioids vs suboxone/subutex or other OUD treatment - continue home prn oxy for now - cont home gabapentin, baclofen (baclofen at reduced dose)  # Acute mebabolic encephalopathy Noted to be more confused on 2/20, wandering, removing IV access Neuro workup reassuring Plan: Restart home psych regimen with pharmacy assistance Educate patient on importance of adherence to prescribed regimen   # GAD - cont klonopin - cont home buspar   # Hypokalemia - replete and monitor   # AKI Prerenal 2/2 overdose, resolved   # Loose stool Resolved - will d/c stool softeners and monitor   # Aspiration pneumonia # Acute hypoxic respiratory failure Extubated  and now breathing comfortably on room air, most recent cxr no infiltrates. Finished a course of IV abx in the ICU. MRI nothing acute, EEG no seizure - monitor   # Debility PT advising SNF, TOC is aware and pursuing a bed  # Facial twitching # Right  upper extremity weakness # Myositis/rhabdomyolysis Patient with severe and persistent right upper extremity weakness.  MRI brain and cervical spine overall unrevealing.  Patient does have diffuse soft tissue edema on right shoulder concerning for myositis.  CK has normalized. Plan: Upper extremity compartment syndrome is felt unlikely.  Patient has palpable pulses on right upper extremity.  I suspect myositis secondary to rhabdomyolysis from prolonged immobility.  Also possibly contribution from statin induced myopathy.  Statin is held and patient is encouraged to work regularly with physical therapy.  She frequently declines PT services.  I had a lengthy conversation with the patient on 2/21 to reiterate the importance of regular therapy sessions to promote full recovery.  She expresses understanding     DVT prophylaxis: SQ lovenox Code Status: FULL Family Communication:Daughter via phone 2/19 Disposition Plan: Status is: Inpatient Remains inpatient appropriate because: Weakness decreased from baseline.  Resolving encephalopathy.  Will need SNF   Level of care: Med-Surg  Consultants:  None  Procedures:  None  Antimicrobials: None    Subjective: Seen and examined.  Less confused today  Objective: Vitals:   12/12/23 1613 12/12/23 2230 12/13/23 0500 12/13/23 0754  BP: 132/79 112/79  113/75  Pulse: 85 82  87  Resp: 16 16  16   Temp: 97.8 F (36.6 C) 98 F (36.7 C)  98.6 F (37 C)  TempSrc:  Oral  Oral  SpO2: 94% 96%  96%  Weight:   84.8 kg   Height:        Intake/Output Summary (Last 24 hours) at 12/13/2023 1325 Last data filed at 12/13/2023 1235 Gross per 24 hour  Intake --  Output 350 ml  Net -350 ml   Filed Weights   12/11/23 0500 12/12/23 0500 12/13/23 0500  Weight: 94 kg 89.1 kg 84.8 kg    Examination:  General exam: Appears fatigued Respiratory system: Lungs clear, normal WOB, RA Cardiovascular system: S1S2, RRR, no murmur Gastrointestinal system: Soft,  NT/ND, normal bowel sounds Central nervous system: Alert and oriented x 2.  No focal deficits Extremities: Right upper extremity weak Skin: No rashes, lesions or ulcers Psychiatry: Judgement and insight appear impaired. Mood & affect flattened/confused.     Data Reviewed: I have personally reviewed following labs and imaging studies  CBC: Recent Labs  Lab 12/07/23 0345 12/08/23 0540 12/09/23 0449 12/10/23 0648 12/11/23 0606  WBC 15.5* 11.5* 12.3* 10.8* 11.6*  NEUTROABS  --   --   --   --  8.1*  HGB 12.3 10.8* 11.2* 12.7 12.9  HCT 37.5 33.4* 33.8* 37.1 38.9  MCV 96.2 95.7 92.9 90.7 93.7  PLT 160 158 170 206 243   Basic Metabolic Panel: Recent Labs  Lab 12/06/23 1706 12/07/23 0345 12/07/23 1701 12/08/23 0540 12/09/23 0449 12/10/23 0648 12/10/23 0822 12/11/23 0606 12/12/23 0805 12/13/23 0642  NA  --  147*  --    < > 141  --  136 135 138 138  K  --  3.9  --    < > 3.1*  --  3.1* 3.0* 3.7 3.6  CL  --  114*  --    < > 105  --  100 103 106 108  CO2  --  22  --    < > 25  --  24 21* 22 23  GLUCOSE  --  120*  --    < > 115*  --  117* 193* 119* 112*  BUN  --  36*  --    < > 20  --  21 19 13 11   CREATININE  --  0.74  --    < > 0.51  --  0.60 0.71 0.59 0.58  CALCIUM  --  8.8*  --    < > 8.6*  --  9.0 9.1 8.9 8.7*  MG 2.1 2.2 1.9  --   --  2.0  --   --   --  2.2  PHOS 1.5* 2.1* 2.3*  --   --   --  2.8  --   --  3.7   < > = values in this interval not displayed.   GFR: Estimated Creatinine Clearance: 81.1 mL/min (by C-G formula based on SCr of 0.58 mg/dL). Liver Function Tests: No results for input(s): "AST", "ALT", "ALKPHOS", "BILITOT", "PROT", "ALBUMIN" in the last 168 hours.  No results for input(s): "LIPASE", "AMYLASE" in the last 168 hours. No results for input(s): "AMMONIA" in the last 168 hours.  Coagulation Profile: No results for input(s): "INR", "PROTIME" in the last 168 hours.  Cardiac Enzymes: Recent Labs  Lab 12/11/23 0606  CKTOTAL 122   BNP (last 3  results) No results for input(s): "PROBNP" in the last 8760 hours. HbA1C: No results for input(s): "HGBA1C" in the last 72 hours. CBG: Recent Labs  Lab 12/09/23 1207 12/09/23 2010 12/09/23 2322 12/10/23 0413 12/10/23 0831  GLUCAP 135* 131* 116* 117* 108*   Lipid Profile: No results for input(s): "CHOL", "HDL", "LDLCALC", "TRIG", "CHOLHDL", "LDLDIRECT" in the last 72 hours. Thyroid Function Tests: No results for input(s): "TSH", "T4TOTAL", "FREET4", "T3FREE", "THYROIDAB" in the last 72 hours. Anemia Panel: Recent Labs    12/11/23 0606  VITAMINB12 528   Sepsis Labs: Recent Labs  Lab 12/08/23 0540  PROCALCITON 0.30    Recent Results (from the past 240 hours)  Culture, blood (routine x 2)     Status: None   Collection Time: 12/04/23  1:43 PM   Specimen: BLOOD RIGHT HAND  Result Value Ref Range Status   Specimen Description BLOOD RIGHT HAND  Final   Special Requests   Final    BOTTLES DRAWN AEROBIC AND ANAEROBIC Blood Culture results may not be optimal due to an inadequate volume of blood received in culture bottles   Culture   Final    NO GROWTH 5 DAYS Performed at [facility], [address] [place], [place] [address]    Report Status 12/09/2023 FINAL  Final  Culture, blood (routine x 2)     Status: None   Collection Time: 12/04/23  1:43 PM   Specimen: BLOOD RIGHT ARM  Result Value Ref Range Status   Specimen Description BLOOD RIGHT ARM  Final   Special Requests   Final    BOTTLES DRAWN AEROBIC AND ANAEROBIC Blood Culture results may not be optimal due to an inadequate volume of blood received in culture bottles   Culture   Final    NO GROWTH 5 DAYS Performed at [facility], [address] [place], [place] [address]    Report Status 12/09/2023 FINAL  Final  MRSA Next Gen by PCR, Nasal     Status: None   Collection Time: 12/04/23 11:42 PM   Specimen: Nasopharyngeal Swab;  Nasal Swab  Result Value Ref Range Status   MRSA by PCR Next Gen  NOT DETECTED NOT DETECTED Final    Comment: (NOTE) The GeneXpert MRSA Assay (FDA approved for NASAL specimens only), is one component of a comprehensive MRSA colonization surveillance program. It is not intended to diagnose MRSA infection nor to guide or monitor treatment for MRSA infections. Test performance is not FDA approved in patients less than 57 years old. Performed at [facility], [address] [place], [place] [address]   Respiratory (~20 pathogens) panel by PCR     Status: None   Collection Time: 12/04/23 11:42 PM   Specimen: Nasopharyngeal Swab; Respiratory  Result Value Ref Range Status   Adenovirus NOT DETECTED NOT DETECTED Final   Coronavirus 229E NOT DETECTED NOT DETECTED Final    Comment: (NOTE) The Coronavirus on the Respiratory Panel, DOES NOT test for the novel  Coronavirus (2019 nCoV)    Coronavirus HKU1 NOT DETECTED NOT DETECTED Final   Coronavirus NL63 NOT DETECTED NOT DETECTED Final   Coronavirus OC43 NOT DETECTED NOT DETECTED Final   Metapneumovirus NOT DETECTED NOT DETECTED Final   Rhinovirus / Enterovirus NOT DETECTED NOT DETECTED Final   Influenza A NOT DETECTED NOT DETECTED Final   Influenza B NOT DETECTED NOT DETECTED Final   Parainfluenza Virus 1 NOT DETECTED NOT DETECTED Final   Parainfluenza Virus 2 NOT DETECTED NOT DETECTED Final   Parainfluenza Virus 3 NOT DETECTED NOT DETECTED Final   Parainfluenza Virus 4 NOT DETECTED NOT DETECTED Final   Respiratory Syncytial Virus NOT DETECTED NOT DETECTED Final   Bordetella pertussis NOT DETECTED NOT DETECTED Final   Bordetella Parapertussis NOT DETECTED NOT DETECTED Final   Chlamydophila pneumoniae NOT DETECTED NOT DETECTED Final   Mycoplasma pneumoniae NOT DETECTED NOT DETECTED Final    Comment: Performed at [facility], [address], [place], [place] [address]  Culture, Respiratory w Gram Stain     Status: None   Collection Time: 12/06/23  3:11 PM   Specimen: Tracheal Aspirate   Result Value Ref Range Status   Specimen Description   Final    TRACHEAL ASPIRATE Performed at [facility], [address], [place], [place] [address]    Special Requests   Final    NONE Performed at [facility], [address] [place], [place] [address]    Gram Stain   Final    NO WBC SEEN NO ORGANISMS SEEN Performed at [facility], [address], [place], [place] [address]    Culture FEW CANDIDA ALBICANS  Final   Report Status 12/09/2023 FINAL  Final  Culture, blood (Routine X 2) w Reflex to ID Panel     Status: None   Collection Time: 12/08/23  9:42 AM   Specimen: BLOOD  Result Value Ref Range Status   Specimen Description BLOOD BLOOD LEFT HAND  Final   Special Requests   Final    BOTTLES DRAWN AEROBIC AND ANAEROBIC Blood Culture results may not be optimal due to an inadequate volume of blood received in culture bottles   Culture   Final    NO GROWTH 5 DAYS Performed at [facility] [place], [address], [place], [place] [address]    Report Status 12/13/2023 FINAL  Final  Culture, blood (Routine X 2) w Reflex to ID Panel     Status: None   Collection Time: 12/08/23  9:42 AM   Specimen: BLOOD  Result Value Ref Range Status  Specimen Description BLOOD BLOOD RIGHT HAND  Final   Special Requests   Final    BOTTLES DRAWN AEROBIC AND ANAEROBIC Blood Culture results may not be optimal due to an inadequate volume of blood received in culture bottles   Culture   Final    NO GROWTH 5 DAYS Performed at [facility], [address] [place], [place] [address]    Report Status 12/13/2023 FINAL  Final  Gastrointestinal Panel by PCR , Stool     Status: None   Collection Time: 12/11/23  6:52 PM   Specimen: Stool  Result Value Ref Range Status   Campylobacter species NOT DETECTED NOT DETECTED Final   Plesimonas shigelloides NOT DETECTED NOT DETECTED Final   Salmonella species NOT DETECTED NOT DETECTED Final   Yersinia enterocolitica  NOT DETECTED NOT DETECTED Final   Vibrio species NOT DETECTED NOT DETECTED Final   Vibrio cholerae NOT DETECTED NOT DETECTED Final   Enteroaggregative E coli (EAEC) NOT DETECTED NOT DETECTED Final   Enteropathogenic E coli (EPEC) NOT DETECTED NOT DETECTED Final   Enterotoxigenic E coli (ETEC) NOT DETECTED NOT DETECTED Final   Shiga like toxin producing E coli (STEC) NOT DETECTED NOT DETECTED Final   Shigella/Enteroinvasive E coli (EIEC) NOT DETECTED NOT DETECTED Final   Cryptosporidium NOT DETECTED NOT DETECTED Final   Cyclospora cayetanensis NOT DETECTED NOT DETECTED Final   Entamoeba histolytica NOT DETECTED NOT DETECTED Final   Giardia lamblia NOT DETECTED NOT DETECTED Final   Adenovirus F40/41 NOT DETECTED NOT DETECTED Final   Astrovirus NOT DETECTED NOT DETECTED Final   Norovirus GI/GII NOT DETECTED NOT DETECTED Final   Rotavirus A NOT DETECTED NOT DETECTED Final   Sapovirus (I, II, IV, and V) NOT DETECTED NOT DETECTED Final    Comment: Performed at [facility], [address] [place], [place] [address]         Radiology Studies: No results found.       Scheduled Meds:  atorvastatin  40 mg Oral Daily   baclofen  5 mg Oral BID   busPIRone  10 mg Oral TID   cholestyramine light  4 g Oral BID   enoxaparin (LOVENOX) injection  40 mg Subcutaneous Q24H   feeding supplement  237 mL Oral TID BM   folic acid  1 mg Oral Daily   gabapentin  300 mg Oral QID   Gerhardt's butt cream   Topical BID   montelukast  10 mg Oral QHS   multivitamin with minerals  1 tablet Oral Daily   nystatin   Topical TID   pantoprazole  40 mg Oral Daily   prazosin  1 mg Oral QHS   QUEtiapine  100 mg Oral TID   thiamine  100 mg Oral Daily   Or   thiamine (VITAMIN B1) injection  100 mg Intravenous Daily   Continuous Infusions:   LOS: 9 days    Tresa Moore, MD [facility]   If 7PM-7AM, please contact night-coverage  12/13/2023, 1:25 PM

## 2023-12-13 NOTE — Plan of Care (Signed)

## 2023-12-14 DIAGNOSIS — J9601 Acute respiratory failure with hypoxia: Secondary | ICD-10-CM | POA: Diagnosis not present

## 2023-12-14 NOTE — Progress Notes (Signed)
 PROGRESS NOTE    Tami Hopkins  ZOX:[id number] DOB: 12-31-1960 DOA: 12/04/2023 PCP: Ardyth Man, PA-C    Brief Narrative:   Tami Hopkins is a 63 y.o. female with a PMH significant for HLD, COPD, Asthma, Tobacco use, Chronic pain syndrome, Anxiety, PTSD, IBS, and GERD who presents to the ED for altered mental status.  Per chart review, family had not heard from the patient for the past 2 days and called for a wellness check.  Patient subsequently found unresponsive sitting on the couch, roommate stated that they thought patient had been sleeping on the couch for the past few days.  Of note, patient was admitted to the hospital a couple of months ago for altered mental status due to suspected polypharmacy use.  Upon EMS arrival patient was given 2 mg of intranasal Narcan with no effect, subsequently given 2 mg IV Narcan with slight respiratory improvement, yet remained unresponsive.  Upon arrival to the ER, BP 94/78, afebrile, 100% on room air, she was noted to have vomit in her airway during suctioning. She was intubated for airway protection. Labs revealed K 6.0, Creat 3.72, BUN 62, AST 162, ALT 85, CK 4155, Troponin 91 and 74, lactic acid 1.6, WBC 19.0, Drug screen + benzodiazepine, opiate, tricyclics. She was given a total of 3 liters of IVF, started on broad spectrum antibiotics, cultures sent, and started on Norepinephrine infusion. CT c-spine without noted fracture, CT head without acute pathology, CT A/P/T bilateral atelectasis, stable non obstructing right renal calculi, hepatic steatosis, aortic atherosclerosis, no acute intra abdominal process.     PCCM was consulted for Medical Management in this critically ill patient in septic shock and respiratory failure suspected due to polysubstance abuse and aspiration Pneumonia   2/12: Admitted with AMS due to polysubstance abuse. Septic shock, levophed, cultures sent, intubated  02/13: Brainstem reflexes improved today with regain of  cough, gag, pupils reactive.  02/14: Improved mental status but not yet following commands.  02/16: Extubated to nasal cannula.  With right sided weakness in upper and lower extremity and facial twitches. Febrile to 101. Neurology is consulted.  2/18: [facility] assumed care 2/20: Mental status waxes and wanes.  Delirium noted last night   Assessment & Plan:   Principal Problem:   Acute hypoxic respiratory failure (HCC) Active Problems:   Rhabdomyolysis   Aspiration pneumonia (HCC)   AKI (acute kidney injury) (HCC)   Chronic obstructive pulmonary disease (HCC)   Chronic pain syndrome   Post-traumatic stress disorder   Gastroesophageal reflux disease with esophagitis   High cholesterol   Moderate persistent asthma without complication   Tobacco use disorder   Acute respiratory failure (HCC)  # Unintentional opioid overdose # Opioid use disorder # Chronic pain Denies intentional overdose, denies SI - will need close outpatient f/u with pain medicine and plan to wean off opioids vs suboxone/subutex or other OUD treatment - continue home prn oxy for now - cont home gabapentin, baclofen (baclofen at reduced dose)  # Acute mebabolic encephalopathy Noted to be more confused on 2/20, wandering, removing IV access Neuro workup reassuring Plan: Restart home psych regimen with pharmacy assistance Educate patient on importance of adherence to prescribed regimen   # GAD - cont klonopin - cont home buspar   # Hypokalemia - replete and monitor   # AKI Prerenal 2/2 overdose, resolved   # Loose stool Resolved - will d/c stool softeners and monitor   # Aspiration pneumonia # Acute hypoxic respiratory failure Extubated  and now breathing comfortably on room air, most recent cxr no infiltrates. Finished a course of IV abx in the ICU. MRI nothing acute, EEG no seizure - monitor   # Debility PT advising SNF, TOC is aware and pursuing a bed No bed offers yet.  Patient was  educated extensively on the importance of regular working with physical therapy.  She agreed and worked with therapy on 2/21.  I recommended daily interactions with therapy, affective recovery mobilization.  # Facial twitching # Right upper extremity weakness # Myositis/rhabdomyolysis Patient with severe and persistent right upper extremity weakness.  MRI brain and cervical spine overall unrevealing.  Patient does have diffuse soft tissue edema on right shoulder concerning for myositis.  CK has normalized. Plan: Upper extremity compartment syndrome is felt unlikely.  Patient has palpable pulses on right upper extremity.  I suspect myositis secondary to rhabdomyolysis from prolonged immobility.  Also possibly contribution from statin induced myopathy.  Statin is held and patient is encouraged to work regularly with physical therapy.  She frequently declines PT services.  I had a lengthy conversation with the patient on 2/21 to reiterate the importance of regular therapy sessions to promote full recovery.  She expresses understanding     DVT prophylaxis: SQ lovenox Code Status: FULL Family Communication:Daughter via phone 2/19 Disposition Plan: Status is: Inpatient Remains inpatient appropriate because: Weakness decreased from baseline.  Resolving encephalopathy.  Will need SNF   Level of care: Med-Surg  Consultants:  None  Procedures:  None  Antimicrobials: None    Subjective: Seen and examined.  No acute overnight events  Objective: Vitals:   12/13/23 1539 12/13/23 2139 12/14/23 0500 12/14/23 0804  BP: 103/72 101/74  99/71  Pulse: 98 95  91  Resp: 16 18  16   Temp: 98.3 F (36.8 C) 98.4 F (36.9 C)  98.3 F (36.8 C)  TempSrc:  Oral  Oral  SpO2: 97% 96%  95%  Weight:   84.8 kg   Height:        Intake/Output Summary (Last 24 hours) at 12/14/2023 1143 Last data filed at 12/14/2023 0500 Gross per 24 hour  Intake 480 ml  Output 150 ml  Net 330 ml   Filed Weights    12/12/23 0500 12/13/23 0500 12/14/23 0500  Weight: 89.1 kg 84.8 kg 84.8 kg    Examination:  General exam: NAD.  Appears fatigued Respiratory system: Lungs clear, normal WOB, RA Cardiovascular system: S1S2, RRR, no murmur Gastrointestinal system: Soft, NT/ND, normal bowel sounds Central nervous system: Alert and oriented x 2.  No focal deficits Extremities: Right upper extremity weak.  Gait not assessed Skin: No rashes, lesions or ulcers Psychiatry: Judgement and insight appear impaired. Mood & affect flattened/confused.     Data Reviewed: I have personally reviewed following labs and imaging studies  CBC: Recent Labs  Lab 12/08/23 0540 12/09/23 0449 12/10/23 0648 12/11/23 0606  WBC 11.5* 12.3* 10.8* 11.6*  NEUTROABS  --   --   --  8.1*  HGB 10.8* 11.2* 12.7 12.9  HCT 33.4* 33.8* 37.1 38.9  MCV 95.7 92.9 90.7 93.7  PLT 158 170 206 243   Basic Metabolic Panel: Recent Labs  Lab 12/07/23 1701 12/08/23 0540 12/09/23 0449 12/10/23 0648 12/10/23 0822 12/11/23 0606 12/12/23 0805 12/13/23 0642  NA  --    < > 141  --  136 135 138 138  K  --    < > 3.1*  --  3.1* 3.0* 3.7 3.6  CL  --    < >  105  --  100 103 106 108  CO2  --    < > 25  --  24 21* 22 23  GLUCOSE  --    < > 115*  --  117* 193* 119* 112*  BUN  --    < > 20  --  21 19 13 11   CREATININE  --    < > 0.51  --  0.60 0.71 0.59 0.58  CALCIUM  --    < > 8.6*  --  9.0 9.1 8.9 8.7*  MG 1.9  --   --  2.0  --   --   --  2.2  PHOS 2.3*  --   --   --  2.8  --   --  3.7   < > = values in this interval not displayed.   GFR: Estimated Creatinine Clearance: 81.1 mL/min (by C-G formula based on SCr of 0.58 mg/dL). Liver Function Tests: No results for input(s): "AST", "ALT", "ALKPHOS", "BILITOT", "PROT", "ALBUMIN" in the last 168 hours.  No results for input(s): "LIPASE", "AMYLASE" in the last 168 hours. No results for input(s): "AMMONIA" in the last 168 hours.  Coagulation Profile: No results for input(s): "INR",  "PROTIME" in the last 168 hours.  Cardiac Enzymes: Recent Labs  Lab 12/11/23 0606  CKTOTAL 122   BNP (last 3 results) No results for input(s): "PROBNP" in the last 8760 hours. HbA1C: No results for input(s): "HGBA1C" in the last 72 hours. CBG: Recent Labs  Lab 12/09/23 1207 12/09/23 2010 12/09/23 2322 12/10/23 0413 12/10/23 0831  GLUCAP 135* 131* 116* 117* 108*   Lipid Profile: No results for input(s): "CHOL", "HDL", "LDLCALC", "TRIG", "CHOLHDL", "LDLDIRECT" in the last 72 hours. Thyroid Function Tests: No results for input(s): "TSH", "T4TOTAL", "FREET4", "T3FREE", "THYROIDAB" in the last 72 hours. Anemia Panel: No results for input(s): "VITAMINB12", "FOLATE", "FERRITIN", "TIBC", "IRON", "RETICCTPCT" in the last 72 hours.  Sepsis Labs: Recent Labs  Lab 12/08/23 0540  PROCALCITON 0.30    Recent Results (from the past 240 hours)  Culture, blood (routine x 2)     Status: None   Collection Time: 12/04/23  1:43 PM   Specimen: BLOOD RIGHT HAND  Result Value Ref Range Status   Specimen Description BLOOD RIGHT HAND  Final   Special Requests   Final    BOTTLES DRAWN AEROBIC AND ANAEROBIC Blood Culture results may not be optimal due to an inadequate volume of blood received in culture bottles   Culture   Final    NO GROWTH 5 DAYS Performed at [facility], [address] [place], [place] [address]    Report Status 12/09/2023 FINAL  Final  Culture, blood (routine x 2)     Status: None   Collection Time: 12/04/23  1:43 PM   Specimen: BLOOD RIGHT ARM  Result Value Ref Range Status   Specimen Description BLOOD RIGHT ARM  Final   Special Requests   Final    BOTTLES DRAWN AEROBIC AND ANAEROBIC Blood Culture results may not be optimal due to an inadequate volume of blood received in culture bottles   Culture   Final    NO GROWTH 5 DAYS Performed at [facility], [address], [place], [place] [address]    Report Status 12/09/2023 FINAL  Final  MRSA  Next Gen by PCR, Nasal     Status: None   Collection Time: 12/04/23 11:42 PM   Specimen: Nasopharyngeal Swab; Nasal Swab  Result Value Ref  Range Status   MRSA by PCR Next Gen NOT DETECTED NOT DETECTED Final    Comment: (NOTE) The GeneXpert MRSA Assay (FDA approved for NASAL specimens only), is one component of a comprehensive MRSA colonization surveillance program. It is not intended to diagnose MRSA infection nor to guide or monitor treatment for MRSA infections. Test performance is not FDA approved in patients less than 26 years old. Performed at [facility], [address] [place], [place] [address]   Respiratory (~20 pathogens) panel by PCR     Status: None   Collection Time: 12/04/23 11:42 PM   Specimen: Nasopharyngeal Swab; Respiratory  Result Value Ref Range Status   Adenovirus NOT DETECTED NOT DETECTED Final   Coronavirus 229E NOT DETECTED NOT DETECTED Final    Comment: (NOTE) The Coronavirus on the Respiratory Panel, DOES NOT test for the novel  Coronavirus (2019 nCoV)    Coronavirus HKU1 NOT DETECTED NOT DETECTED Final   Coronavirus NL63 NOT DETECTED NOT DETECTED Final   Coronavirus OC43 NOT DETECTED NOT DETECTED Final   Metapneumovirus NOT DETECTED NOT DETECTED Final   Rhinovirus / Enterovirus NOT DETECTED NOT DETECTED Final   Influenza A NOT DETECTED NOT DETECTED Final   Influenza B NOT DETECTED NOT DETECTED Final   Parainfluenza Virus 1 NOT DETECTED NOT DETECTED Final   Parainfluenza Virus 2 NOT DETECTED NOT DETECTED Final   Parainfluenza Virus 3 NOT DETECTED NOT DETECTED Final   Parainfluenza Virus 4 NOT DETECTED NOT DETECTED Final   Respiratory Syncytial Virus NOT DETECTED NOT DETECTED Final   Bordetella pertussis NOT DETECTED NOT DETECTED Final   Bordetella Parapertussis NOT DETECTED NOT DETECTED Final   Chlamydophila pneumoniae NOT DETECTED NOT DETECTED Final   Mycoplasma pneumoniae NOT DETECTED NOT DETECTED Final    Comment: Performed at [facility], [address] [place], [place] [address]  Culture, Respiratory w Gram Stain     Status: None   Collection Time: 12/06/23  3:11 PM   Specimen: Tracheal Aspirate  Result Value Ref Range Status   Specimen Description   Final    TRACHEAL ASPIRATE Performed at [facility], [address] [place], [place] [address]    Special Requests   Final    NONE Performed at [facility], [address] [place], [place] [address]    Gram Stain   Final    NO WBC SEEN NO ORGANISMS SEEN Performed at [facility], [address] [place], [place] [address]    Culture FEW CANDIDA ALBICANS  Final   Report Status 12/09/2023 FINAL  Final  Culture, blood (Routine X 2) w Reflex to ID Panel     Status: None   Collection Time: 12/08/23  9:42 AM   Specimen: BLOOD  Result Value Ref Range Status   Specimen Description BLOOD BLOOD LEFT HAND  Final   Special Requests   Final    BOTTLES DRAWN AEROBIC AND ANAEROBIC Blood Culture results may not be optimal due to an inadequate volume of blood received in culture bottles   Culture   Final    NO GROWTH 5 DAYS Performed at [facility], [address], [place], [place] [address]    Report Status 12/13/2023 FINAL  Final  Culture, blood (Routine X 2) w Reflex to ID Panel     Status: None   Collection Time: 12/08/23  9:42 AM   Specimen: BLOOD  Result Value Ref Range Status   Specimen Description BLOOD BLOOD RIGHT  HAND  Final   Special Requests   Final    BOTTLES DRAWN AEROBIC AND ANAEROBIC Blood Culture results may not be optimal due to an inadequate volume of blood received in culture bottles   Culture   Final    NO GROWTH 5 DAYS Performed at [facility], [address] [place], [place] [address]    Report Status 12/13/2023 FINAL  Final  Gastrointestinal Panel by PCR , Stool     Status: None   Collection Time: 12/11/23  6:52 PM   Specimen: Stool  Result Value Ref Range Status   Campylobacter  species NOT DETECTED NOT DETECTED Final   Plesimonas shigelloides NOT DETECTED NOT DETECTED Final   Salmonella species NOT DETECTED NOT DETECTED Final   Yersinia enterocolitica NOT DETECTED NOT DETECTED Final   Vibrio species NOT DETECTED NOT DETECTED Final   Vibrio cholerae NOT DETECTED NOT DETECTED Final   Enteroaggregative E coli (EAEC) NOT DETECTED NOT DETECTED Final   Enteropathogenic E coli (EPEC) NOT DETECTED NOT DETECTED Final   Enterotoxigenic E coli (ETEC) NOT DETECTED NOT DETECTED Final   Shiga like toxin producing E coli (STEC) NOT DETECTED NOT DETECTED Final   Shigella/Enteroinvasive E coli (EIEC) NOT DETECTED NOT DETECTED Final   Cryptosporidium NOT DETECTED NOT DETECTED Final   Cyclospora cayetanensis NOT DETECTED NOT DETECTED Final   Entamoeba histolytica NOT DETECTED NOT DETECTED Final   Giardia lamblia NOT DETECTED NOT DETECTED Final   Adenovirus F40/41 NOT DETECTED NOT DETECTED Final   Astrovirus NOT DETECTED NOT DETECTED Final   Norovirus GI/GII NOT DETECTED NOT DETECTED Final   Rotavirus A NOT DETECTED NOT DETECTED Final   Sapovirus (I, II, IV, and V) NOT DETECTED NOT DETECTED Final    Comment: Performed at [facility], [address], [place], [place] [address]         Radiology Studies: No results found.       Scheduled Meds:  baclofen  5 mg Oral BID   busPIRone  10 mg Oral TID   enoxaparin (LOVENOX) injection  40 mg Subcutaneous Q24H   feeding supplement  237 mL Oral TID BM   folic acid  1 mg Oral Daily   gabapentin  300 mg Oral QID   Gerhardt's butt cream   Topical BID   montelukast  10 mg Oral QHS   multivitamin with minerals  1 tablet Oral Daily   nystatin   Topical TID   pantoprazole  40 mg Oral Daily   prazosin  1 mg Oral QHS   QUEtiapine  100 mg Oral Daily   QUEtiapine  200 mg Oral QHS   thiamine  100 mg Oral Daily   Or   thiamine (VITAMIN B1) injection  100 mg Intravenous Daily   Continuous Infusions:   LOS: 10 days     Tresa Moore, MD [facility]   If 7PM-7AM, please contact night-coverage  12/14/2023, 11:43 AM

## 2023-12-14 NOTE — Progress Notes (Signed)
 Mobility Specialist - Progress Note     12/14/23 1537  Mobility  Activity Ambulated with assistance in hallway;Stood at bedside  Level of Assistance Contact guard assist, steadying assist  Assistive Device Front wheel walker  Distance Ambulated (ft) 100 ft  Range of Motion/Exercises Active  Activity Response Tolerated well  Mobility Referral Yes  Mobility visit 1 Mobility  Mobility Specialist Start Time (ACUTE ONLY) 1515  Mobility Specialist Stop Time (ACUTE ONLY) 1531  Mobility Specialist Time Calculation (min) (ACUTE ONLY) 16 min   Pt laying in bed crying saying "I am stuck" upon entry. Pt calmed down once we told her the plan for the mobility session. Pt moved to EOB mod indep using left arm to move right arm around and utilized bed railing to complete bed mobility. Pt STS and ambulates to hallway beside NS CGA with RW with no assistance to stand. Pt took x1 seated rest break before returned to recliner in room. Pt left with chair alarm activated.   Johnathan Hausen Mobility Specialist 12/14/23, 5:45 PM

## 2023-12-14 NOTE — Plan of Care (Signed)
  Problem: Clinical Measurements: Goal: Will remain free from infection Outcome: Progressing Goal: Diagnostic test results will improve Outcome: Progressing Goal: Respiratory complications will improve Outcome: Progressing Goal: Cardiovascular complication will be avoided Outcome: Progressing   Problem: Activity: Goal: Risk for activity intolerance will decrease Outcome: Progressing   Problem: Nutrition: Goal: Adequate nutrition will be maintained Outcome: Progressing   Problem: Elimination: Goal: Will not experience complications related to bowel motility Outcome: Progressing Goal: Will not experience complications related to urinary retention Outcome: Progressing   Problem: Pain Managment: Goal: General experience of comfort will improve and/or be controlled Outcome: Progressing   Problem: Safety: Goal: Ability to remain free from injury will improve Outcome: Progressing

## 2023-12-15 DIAGNOSIS — J9601 Acute respiratory failure with hypoxia: Secondary | ICD-10-CM | POA: Diagnosis not present

## 2023-12-15 MED ORDER — OXYMETAZOLINE HCL 0.05 % NA SOLN
1.0000 | Freq: Two times a day (BID) | NASAL | Status: AC
  Administered 2023-12-15 – 2023-12-17 (×6): 1 via NASAL
  Filled 2023-12-15: qty 15

## 2023-12-15 NOTE — Progress Notes (Signed)
 Physical Therapy Treatment Patient Details Name: Tami Hopkins MRN: [id number] DOB: September 06, 1961 Today's Date: 12/15/2023   History of Present Illness This is a 63 year old woman with a history of hyperlipidemia, COPD, asthma, tobacco use, chronic pain syndrome, anxiety, PTSD, IBS, and GERD who presented to the ED for unresponsiveness after being found down at home, possibly for up to 2 days, requiring intubation and admission to the ICU. Etiology favored to be narcotic overdose or polypharmacy    PT Comments  Pt ready for session on 4th attempt.  She is able to get in/out of bed with bed features, time and encouragement.  She stands with min a x  with cues for hand placements and walks 80' x 2 with short seated rest in hallway.  Overall progressing with mobility on unit but continues to require +1 assist for safety.   If plan is discharge home, recommend the following: A little help with walking and/or transfers;A little help with bathing/dressing/bathroom;Assistance with cooking/housework;Assist for transportation;Help with stairs or ramp for entrance;Direct supervision/assist for financial management   Can travel by private vehicle        Equipment Recommendations       Recommendations for Other Services       Precautions / Restrictions Precautions Precautions: Fall Recall of Precautions/Restrictions: Impaired Restrictions Weight Bearing Restrictions Per Provider Order: No     Mobility  Bed Mobility Overal bed mobility: Needs Assistance Bed Mobility: Supine to Sit, Sit to Supine     Supine to sit: Supervision Sit to supine: Supervision   General bed mobility comments: encouragment to do on her own with bed features Patient Response: Cooperative  Transfers Overall transfer level: Needs assistance Equipment used: Rolling walker (2 wheels) Transfers: Sit to/from Stand Sit to Stand: Min assist                Ambulation/Gait Ambulation/Gait assistance: Armed forces technical officer (Feet): 80 Feet Assistive device: Rolling walker (2 wheels) Gait Pattern/deviations: Step-to pattern, Decreased step length - right, Decreased step length - left, Decreased dorsiflexion - right, Ataxic Gait velocity: decr     General Gait Details: 80' x 2   Stairs             Wheelchair Mobility     Tilt Bed Tilt Bed Patient Response: Cooperative  Modified Rankin (Stroke Patients Only)       Balance Overall balance assessment: Needs assistance Sitting-balance support: Feet supported Sitting balance-Leahy Scale: Good     Standing balance support: Bilateral upper extremity supported, During functional activity, Reliant on assistive device for balance Standing balance-Leahy Scale: Fair                              Communication    Cognition Arousal: Alert Behavior During Therapy: WFL for tasks assessed/performed   PT - Cognitive impairments: No apparent impairments                                Cueing    Exercises      General Comments        Pertinent Vitals/Pain Pain Assessment Pain Assessment: No/denies pain Pain Intervention(s): Monitored during session    Home Living                          Prior Function  PT Goals (current goals can now be found in the care plan section) Progress towards PT goals: Progressing toward goals    Frequency    Min 1X/week      PT Plan      Co-evaluation              AM-PAC PT "6 Clicks" Mobility   Outcome Measure  Help needed turning from your back to your side while in a flat bed without using bedrails?: A Little Help needed moving from lying on your back to sitting on the side of a flat bed without using bedrails?: A Little Help needed moving to and from a bed to a chair (including a wheelchair)?: A Little Help needed standing up from a chair using your arms (e.g., wheelchair or bedside chair)?: A Little Help needed to walk  in hospital room?: A Little Help needed climbing 3-5 steps with a railing? : A Lot 6 Click Score: 17    End of Session Equipment Utilized During Treatment: Gait belt Activity Tolerance: Patient tolerated treatment well Patient left: in bed;with call bell/phone within reach;with bed alarm set Nurse Communication: Mobility status PT Visit Diagnosis: Other abnormalities of gait and mobility (R26.89);Difficulty in walking, not elsewhere classified (R26.2);Muscle weakness (generalized) (M62.81)     Time: 4098-1191 PT Time Calculation (min) (ACUTE ONLY): 13 min  Charges:    $Gait Training: 8-22 mins PT General Charges $$ ACUTE PT VISIT: 1 Visit                   Danielle Dess, PTA 12/15/23, 3:27 PM

## 2023-12-15 NOTE — Plan of Care (Signed)
  Problem: Clinical Measurements: Goal: Respiratory complications will improve Outcome: Progressing   Problem: Elimination: Goal: Will not experience complications related to bowel motility Outcome: Progressing Goal: Will not experience complications related to urinary retention Outcome: Progressing   Problem: Pain Managment: Goal: General experience of comfort will improve and/or be controlled Outcome: Progressing   Problem: Safety: Goal: Ability to remain free from injury will improve Outcome: Progressing

## 2023-12-15 NOTE — Plan of Care (Signed)
  Problem: Elimination: Goal: Will not experience complications related to urinary retention Outcome: Progressing   Problem: Pain Managment: Goal: General experience of comfort will improve and/or be controlled Outcome: Progressing

## 2023-12-15 NOTE — Progress Notes (Signed)
 PROGRESS NOTE    MARLISSA EMERICK  ZOX:[id number] DOB: April 10, 1961 DOA: 12/04/2023 PCP: Ardyth Man, PA-C    Brief Narrative:   Tami Hopkins is a 63 y.o. female with a PMH significant for HLD, COPD, Asthma, Tobacco use, Chronic pain syndrome, Anxiety, PTSD, IBS, and GERD who presents to the ED for altered mental status.  Per chart review, family had not heard from the patient for the past 2 days and called for a wellness check.  Patient subsequently found unresponsive sitting on the couch, roommate stated that they thought patient had been sleeping on the couch for the past few days.  Of note, patient was admitted to the hospital a couple of months ago for altered mental status due to suspected polypharmacy use.  Upon EMS arrival patient was given 2 mg of intranasal Narcan with no effect, subsequently given 2 mg IV Narcan with slight respiratory improvement, yet remained unresponsive.  Upon arrival to the ER, BP 94/78, afebrile, 100% on room air, she was noted to have vomit in her airway during suctioning. She was intubated for airway protection. Labs revealed K 6.0, Creat 3.72, BUN 62, AST 162, ALT 85, CK 4155, Troponin 91 and 74, lactic acid 1.6, WBC 19.0, Drug screen + benzodiazepine, opiate, tricyclics. She was given a total of 3 liters of IVF, started on broad spectrum antibiotics, cultures sent, and started on Norepinephrine infusion. CT c-spine without noted fracture, CT head without acute pathology, CT A/P/T bilateral atelectasis, stable non obstructing right renal calculi, hepatic steatosis, aortic atherosclerosis, no acute intra abdominal process.     PCCM was consulted for Medical Management in this critically ill patient in septic shock and respiratory failure suspected due to polysubstance abuse and aspiration Pneumonia   2/12: Admitted with AMS due to polysubstance abuse. Septic shock, levophed, cultures sent, intubated  02/13: Brainstem reflexes improved today with regain of  cough, gag, pupils reactive.  02/14: Improved mental status but not yet following commands.  02/16: Extubated to nasal cannula.  With right sided weakness in upper and lower extremity and facial twitches. Febrile to 101. Neurology is consulted.  2/18: [facility] assumed care 2/20: Mental status waxes and wanes.  Delirium noted last night   Assessment & Plan:   Principal Problem:   Acute hypoxic respiratory failure (HCC) Active Problems:   Rhabdomyolysis   Aspiration pneumonia (HCC)   AKI (acute kidney injury) (HCC)   Chronic obstructive pulmonary disease (HCC)   Chronic pain syndrome   Post-traumatic stress disorder   Gastroesophageal reflux disease with esophagitis   High cholesterol   Moderate persistent asthma without complication   Tobacco use disorder   Acute respiratory failure (HCC)  # Unintentional opioid overdose # Opioid use disorder # Chronic pain Denies intentional overdose, denies SI - will need close outpatient f/u with pain medicine and plan to wean off opioids vs suboxone/subutex or other OUD treatment - continue home prn oxy for now - cont home gabapentin, baclofen (baclofen at reduced dose)  # Acute mebabolic encephalopathy Noted to be more confused on 2/20, wandering, removing IV access Neuro workup reassuring Mental status improving Plan: Continue home psych regimen Educate patient on importance of adherence to prescribed regimen Delirium precautions, frequent reorienting measures   # GAD - cont klonopin - cont home buspar   # Hypokalemia - replete and monitor as needed   # AKI Prerenal 2/2 overdose, resolved   # Loose stool Resolved   # Aspiration pneumonia # Acute hypoxic respiratory failure Extubated  and now breathing comfortably on room air, most recent cxr no infiltrates. Finished a course of IV abx in the ICU. MRI nothing acute, EEG no seizure - monitor   # Debility PT advising SNF, TOC is aware and pursuing a bed No bed  offers yet.  Patient was educated extensively on the importance of regular working with physical therapy.  She agreed and worked with therapy on 2/21.  I recommended daily interactions with therapy, affective recovery mobilization.  Patient appears more motivated as her strength improves  # Facial twitching # Right upper extremity weakness # Myositis/rhabdomyolysis Patient with severe and persistent right upper extremity weakness.  MRI brain and cervical spine overall unrevealing.  Patient does have diffuse soft tissue edema on right shoulder concerning for myositis.  CK has normalized. Plan: Continue to suspect myositis secondary to rhabdomyolysis.  Upper extremity strength is improving on a daily basis.  Patient is motivated to improve and recover.  She has been actively working with therapy and mobility services on a daily basis.  She will benefit from skilled nursing facility placement.     DVT prophylaxis: SQ lovenox Code Status: FULL Family Communication:Daughter via phone 2/19 Disposition Plan: Status is: Inpatient Remains inpatient appropriate because: Weakness decreased from baseline.  Resolving encephalopathy.  Will need SNF   Level of care: Med-Surg  Consultants:  None  Procedures:  None  Antimicrobials: None    Subjective: Seen and examined.  No acute events overnight.  No new complaints.  Patient's mental status appears improved.  She is alert and oriented x 4.  Her upper extremity strength is improving.  Objective: Vitals:   12/14/23 0804 12/14/23 1557 12/14/23 2208 12/15/23 0845  BP: 99/71 105/76 117/82 110/76  Pulse: 91 (!) 101 (!) 106 (!) 101  Resp: 16 16 16 18   Temp: 98.3 F (36.8 C) 98.3 F (36.8 C) 98.1 F (36.7 C) 98 F (36.7 C)  TempSrc: Oral Oral    SpO2: 95% 97% 99% 94%  Weight:      Height:        Intake/Output Summary (Last 24 hours) at 12/15/2023 1135 Last data filed at 12/14/2023 1923 Gross per 24 hour  Intake --  Output 500 ml  Net  -500 ml   Filed Weights   12/12/23 0500 12/13/23 0500 12/14/23 0500  Weight: 89.1 kg 84.8 kg 84.8 kg    Examination:  General exam: No acute distress.  In good spirits Respiratory system: Lungs clear, normal WOB, RA Cardiovascular system: S1S2, RRR, no murmur Gastrointestinal system: Soft, NT/ND, normal bowel sounds Central nervous system: Alert and oriented x 2.  No focal deficits Extremities: Right upper extremity weak, improved from prior.  Gait not assessed Skin: No rashes, lesions or ulcers Psychiatry: Judgement and insight appear impaired. Mood & affect flattened/confused.     Data Reviewed: I have personally reviewed following labs and imaging studies  CBC: Recent Labs  Lab 12/09/23 0449 12/10/23 0648 12/11/23 0606  WBC 12.3* 10.8* 11.6*  NEUTROABS  --   --  8.1*  HGB 11.2* 12.7 12.9  HCT 33.8* 37.1 38.9  MCV 92.9 90.7 93.7  PLT 170 206 243   Basic Metabolic Panel: Recent Labs  Lab 12/09/23 0449 12/10/23 0648 12/10/23 0822 12/11/23 0606 12/12/23 0805 12/13/23 0642  NA 141  --  136 135 138 138  K 3.1*  --  3.1* 3.0* 3.7 3.6  CL 105  --  100 103 106 108  CO2 25  --  24 21* 22  23  GLUCOSE 115*  --  117* 193* 119* 112*  BUN 20  --  21 19 13 11   CREATININE 0.51  --  0.60 0.71 0.59 0.58  CALCIUM 8.6*  --  9.0 9.1 8.9 8.7*  MG  --  2.0  --   --   --  2.2  PHOS  --   --  2.8  --   --  3.7   GFR: Estimated Creatinine Clearance: 81.1 mL/min (by C-G formula based on SCr of 0.58 mg/dL). Liver Function Tests: No results for input(s): "AST", "ALT", "ALKPHOS", "BILITOT", "PROT", "ALBUMIN" in the last 168 hours.  No results for input(s): "LIPASE", "AMYLASE" in the last 168 hours. No results for input(s): "AMMONIA" in the last 168 hours.  Coagulation Profile: No results for input(s): "INR", "PROTIME" in the last 168 hours.  Cardiac Enzymes: Recent Labs  Lab 12/11/23 0606  CKTOTAL 122   BNP (last 3 results) No results for input(s): "PROBNP" in the last  8760 hours. HbA1C: No results for input(s): "HGBA1C" in the last 72 hours. CBG: Recent Labs  Lab 12/09/23 1207 12/09/23 2010 12/09/23 2322 12/10/23 0413 12/10/23 0831  GLUCAP 135* 131* 116* 117* 108*   Lipid Profile: No results for input(s): "CHOL", "HDL", "LDLCALC", "TRIG", "CHOLHDL", "LDLDIRECT" in the last 72 hours. Thyroid Function Tests: No results for input(s): "TSH", "T4TOTAL", "FREET4", "T3FREE", "THYROIDAB" in the last 72 hours. Anemia Panel: No results for input(s): "VITAMINB12", "FOLATE", "FERRITIN", "TIBC", "IRON", "RETICCTPCT" in the last 72 hours.  Sepsis Labs: No results for input(s): "PROCALCITON", "LATICACIDVEN" in the last 168 hours.   Recent Results (from the past 240 hours)  Culture, Respiratory w Gram Stain     Status: None   Collection Time: 12/06/23  3:11 PM   Specimen: Tracheal Aspirate  Result Value Ref Range Status   Specimen Description   Final    TRACHEAL ASPIRATE Performed at [facility], [address], [place], [place] [address]    Special Requests   Final    NONE Performed at [facility], [address] [place], [place] [address]    Gram Stain   Final    NO WBC SEEN NO ORGANISMS SEEN Performed at [facility], [address], [place], [place] [address]    Culture FEW CANDIDA ALBICANS  Final   Report Status 12/09/2023 FINAL  Final  Culture, blood (Routine X 2) w Reflex to ID Panel     Status: None   Collection Time: 12/08/23  9:42 AM   Specimen: BLOOD  Result Value Ref Range Status   Specimen Description BLOOD BLOOD LEFT HAND  Final   Special Requests   Final    BOTTLES DRAWN AEROBIC AND ANAEROBIC Blood Culture results may not be optimal due to an inadequate volume of blood received in culture bottles   Culture   Final    NO GROWTH 5 DAYS Performed at [facility], [address] [place], [place] [address]    Report Status 12/13/2023 FINAL  Final  Culture, blood (Routine X 2) w Reflex to ID  Panel     Status: None   Collection Time: 12/08/23  9:42 AM   Specimen: BLOOD  Result Value Ref Range Status   Specimen Description BLOOD BLOOD RIGHT HAND  Final   Special Requests   Final    BOTTLES DRAWN AEROBIC AND ANAEROBIC Blood Culture results may not be optimal due to an inadequate volume of blood received in culture bottles  Culture   Final    NO GROWTH 5 DAYS Performed at [facility], [address] [place], [place] [address]    Report Status 12/13/2023 FINAL  Final  Gastrointestinal Panel by PCR , Stool     Status: None   Collection Time: 12/11/23  6:52 PM   Specimen: Stool  Result Value Ref Range Status   Campylobacter species NOT DETECTED NOT DETECTED Final   Plesimonas shigelloides NOT DETECTED NOT DETECTED Final   Salmonella species NOT DETECTED NOT DETECTED Final   Yersinia enterocolitica NOT DETECTED NOT DETECTED Final   Vibrio species NOT DETECTED NOT DETECTED Final   Vibrio cholerae NOT DETECTED NOT DETECTED Final   Enteroaggregative E coli (EAEC) NOT DETECTED NOT DETECTED Final   Enteropathogenic E coli (EPEC) NOT DETECTED NOT DETECTED Final   Enterotoxigenic E coli (ETEC) NOT DETECTED NOT DETECTED Final   Shiga like toxin producing E coli (STEC) NOT DETECTED NOT DETECTED Final   Shigella/Enteroinvasive E coli (EIEC) NOT DETECTED NOT DETECTED Final   Cryptosporidium NOT DETECTED NOT DETECTED Final   Cyclospora cayetanensis NOT DETECTED NOT DETECTED Final   Entamoeba histolytica NOT DETECTED NOT DETECTED Final   Giardia lamblia NOT DETECTED NOT DETECTED Final   Adenovirus F40/41 NOT DETECTED NOT DETECTED Final   Astrovirus NOT DETECTED NOT DETECTED Final   Norovirus GI/GII NOT DETECTED NOT DETECTED Final   Rotavirus A NOT DETECTED NOT DETECTED Final   Sapovirus (I, II, IV, and V) NOT DETECTED NOT DETECTED Final    Comment: Performed at [facility], [address], [place], [place] [address]         Radiology Studies: No results  found.       Scheduled Meds:  baclofen  5 mg Oral BID   busPIRone  10 mg Oral TID   enoxaparin (LOVENOX) injection  40 mg Subcutaneous Q24H   feeding supplement  237 mL Oral TID BM   folic acid  1 mg Oral Daily   gabapentin  300 mg Oral QID   Gerhardt's butt cream   Topical BID   montelukast  10 mg Oral QHS   nystatin   Topical TID   oxymetazoline  1 spray Each Nare BID   pantoprazole  40 mg Oral Daily   prazosin  1 mg Oral QHS   QUEtiapine  100 mg Oral Daily   QUEtiapine  200 mg Oral QHS   thiamine  100 mg Oral Daily   Or   thiamine (VITAMIN B1) injection  100 mg Intravenous Daily   Continuous Infusions:   LOS: 11 days    Tresa Moore, MD [facility]   If 7PM-7AM, please contact night-coverage  12/15/2023, 11:35 AM

## 2023-12-16 DIAGNOSIS — J9601 Acute respiratory failure with hypoxia: Secondary | ICD-10-CM | POA: Diagnosis not present

## 2023-12-16 MED ORDER — HYDROXYZINE HCL 25 MG PO TABS
25.0000 mg | ORAL_TABLET | Freq: Once | ORAL | Status: DC
  Filled 2023-12-16: qty 1

## 2023-12-16 NOTE — Plan of Care (Signed)
   Problem: Activity: Goal: Risk for activity intolerance will decrease Outcome: Progressing   Problem: Nutrition: Goal: Adequate nutrition will be maintained Outcome: Progressing   Problem: Pain Managment: Goal: General experience of comfort will improve and/or be controlled Outcome: Progressing

## 2023-12-16 NOTE — TOC Progression Note (Signed)
 Transition of Care [facility]) - Progression Note    Patient Details  Name: Tami Hopkins MRN: [id number] Date of Birth: 08/08/1961  [facility] CM/SW Contact  Marlowe Sax, RN Phone Number: 12/16/2023, 3:25 PM  Clinical Narrative:     Reached out to the facilities that are pending and let them know she is working with PT and OT and will need STR, Waiting on bed offers to review with the patient       Expected Discharge Plan and Services                                               Social Determinants of Health (SDOH) Interventions SDOH Screenings   Food Insecurity: Patient Unable To Answer (12/05/2023)  Recent Concern: Food Insecurity - Food Insecurity Present (10/10/2023)  Housing: Patient Unable To Answer (12/05/2023)  Recent Concern: Housing - High Risk (10/09/2023)  Transportation Needs: Patient Unable To Answer (12/05/2023)  Utilities: Patient Unable To Answer (12/05/2023)  Depression (PHQ2-9): Low Risk  (11/21/2023)  Financial Resource Strain: Medium Risk (09/25/2022)   Received from [facility], [facility]  Physical Activity: Insufficiently Active (01/13/2021)   Received from [facility], [facility]  Social Connections: Socially Isolated (01/13/2021)   Received from [facility], [facility]  Stress: Stress Concern Present (01/13/2021)   Received from [facility], [facility]  Tobacco Use: High Risk (12/04/2023)    Readmission Risk Interventions     No data to display

## 2023-12-16 NOTE — Plan of Care (Signed)
   Problem: Education: Goal: Knowledge of General Education information will improve Description: Including pain rating scale, medication(s)/side effects and non-pharmacologic comfort measures Outcome: Progressing   Problem: Clinical Measurements: Goal: Diagnostic test results will improve Outcome: Progressing   Problem: Activity: Goal: Risk for activity intolerance will decrease Outcome: Progressing   Problem: Pain Managment: Goal: General experience of comfort will improve and/or be controlled Outcome: Progressing

## 2023-12-16 NOTE — Progress Notes (Signed)
 Occupational Therapy Treatment Patient Details Name: Tami Hopkins MRN: [id number] DOB: [birth year] Today's Date: 12/16/2023   History of present illness This is a 63 year old woman with a history of hyperlipidemia, COPD, asthma, tobacco use, chronic pain syndrome, anxiety, PTSD, IBS, and GERD who presented to the ED for unresponsiveness after being found down at home, possibly for up to 2 days, requiring intubation and admission to the ICU. Etiology favored to be narcotic overdose or polypharmacy.   OT comments  Pt seen for OT tx. Pt received on toilet in bathroom and agreeable to session. Pt declined to attempt pericare in sitting, requesting OT to complete with MAX A. Pt then required MIN A from std height toilet to stand. Once in standing, required SBA for mobility back to bed. Pt provided with new yellow theraband with hand loop and securely attached to L bed rail with pt able to return demo RUE elbow extension. Pt instructed in bed level BLE exercises as requested by pt, including SLR, bridges, and hip ab/adduction, pt able to return demo without difficulty. Pt appreciative. Continues to benefit and progressing well.       If plan is discharge home, recommend the following:  A little help with bathing/dressing/bathroom;A lot of help with walking and/or transfers   Equipment Recommendations  Other (comment) (defer)    Recommendations for Other Services      Precautions / Restrictions Precautions Precautions: Fall Recall of Precautions/Restrictions: Impaired Restrictions Weight Bearing Restrictions Per Provider Order: No       Mobility Bed Mobility Overal bed mobility: Needs Assistance Bed Mobility: Sit to Supine       Sit to supine: Supervision        Transfers Overall transfer level: Needs assistance Equipment used: Rolling walker (2 wheels) Transfers: Sit to/from Stand Sit to Stand: Min assist                 Balance Overall balance assessment: Needs  assistance Sitting-balance support: Feet supported Sitting balance-Leahy Scale: Good     Standing balance support: Bilateral upper extremity supported, During functional activity, Reliant on assistive device for balance Standing balance-Leahy Scale: Fair                             ADL either performed or assessed with clinical judgement   ADL Overall ADL's : Needs assistance/impaired                         Toilet Transfer: Rolling walker (2 wheels);Regular Toilet   Toileting- Clothing Manipulation and Hygiene: Maximal assistance;Sitting/lateral lean Toileting - Clothing Manipulation Details (indicate cue type and reason): Pt declined to attempt pericare in sitting, stating "I just can't right now"            Extremity/Trunk Assessment              Vision       Perception     Praxis     Communication     Cognition Arousal: Alert Behavior During Therapy: [facility] for tasks assessed/performed Cognition: No family/caregiver present to determine baseline                                        Cueing      Exercises Other Exercises Other Exercises: Pt provided with new yellow theraband with hand loop and  securely attached to L bed rail with pt able to return demo RUE elbow extension Other Exercises: Pt instructed in bed level BLE exercises as requested by pt, including SLR, bridges, and hip ab/adduction, pt able to return demo without difficulty.    Shoulder Instructions       General Comments      Pertinent Vitals/ Pain          Home Living                                          Prior Functioning/Environment              Frequency  Min 1X/week        Progress Toward Goals  OT Goals(current goals can now be found in the care plan section)  Progress towards OT goals: Progressing toward goals  Acute Rehab OT Goals Patient Stated Goal: go home to her dog OT Goal Formulation: With  patient Time For Goal Achievement: 12/24/23 Potential to Achieve Goals: Good  Plan      Co-evaluation                 AM-PAC OT "6 Clicks" Daily Activity     Outcome Measure   Help from another person eating meals?: None Help from another person taking care of personal grooming?: None Help from another person toileting, which includes using toliet, bedpan, or urinal?: A Lot Help from another person bathing (including washing, rinsing, drying)?: A Little Help from another person to put on and taking off regular upper body clothing?: A Little Help from another person to put on and taking off regular lower body clothing?: A Lot 6 Click Score: 18    End of Session Equipment Utilized During Treatment: Rolling walker (2 wheels)  OT Visit Diagnosis: Other abnormalities of gait and mobility (R26.89);Muscle weakness (generalized) (M62.81)   Activity Tolerance Patient tolerated treatment well   Patient Left with call bell/phone within reach;in bed;with bed alarm set   Nurse Communication          Time: 1610-9604 OT Time Calculation (min): 19 min  Charges: OT General Charges $OT Visit: 1 Visit OT Treatments $Therapeutic Exercise: 8-22 mins  Arman Filter., MPH, MS, OTR/L ascom [phone] 12/16/23, 4:53 PM

## 2023-12-16 NOTE — Progress Notes (Signed)
 PROGRESS NOTE    Tami Hopkins  ZOX:[id number] DOB: [birth year] DOA: 12/04/2023 PCP: Ardyth Man, PA-C    Brief Narrative:   Tami Hopkins is a 63 y.o. female with a PMH significant for HLD, COPD, Asthma, Tobacco use, Chronic pain syndrome, Anxiety, PTSD, IBS, and GERD who presents to the ED for altered mental status.  Per chart review, family had not heard from the patient for the past 2 days and called for a wellness check.  Patient subsequently found unresponsive sitting on the couch, roommate stated that they thought patient had been sleeping on the couch for the past few days.  Of note, patient was admitted to the hospital a couple of months ago for altered mental status due to suspected polypharmacy use.  Upon EMS arrival patient was given 2 mg of intranasal Narcan with no effect, subsequently given 2 mg IV Narcan with slight respiratory improvement, yet remained unresponsive.  Upon arrival to the ER, BP 94/78, afebrile, 100% on room air, she was noted to have vomit in her airway during suctioning. She was intubated for airway protection. Labs revealed K 6.0, Creat 3.72, BUN 62, AST 162, ALT 85, CK 4155, Troponin 91 and 74, lactic acid 1.6, WBC 19.0, Drug screen + benzodiazepine, opiate, tricyclics. She was given a total of 3 liters of IVF, started on broad spectrum antibiotics, cultures sent, and started on Norepinephrine infusion. CT c-spine without noted fracture, CT head without acute pathology, CT A/P/T bilateral atelectasis, stable non obstructing right renal calculi, hepatic steatosis, aortic atherosclerosis, no acute intra abdominal process.     PCCM was consulted for Medical Management in this critically ill patient in septic shock and respiratory failure suspected due to polysubstance abuse and aspiration Pneumonia   2/12: Admitted with AMS due to polysubstance abuse. Septic shock, levophed, cultures sent, intubated  02/13: Brainstem reflexes improved today with regain of  cough, gag, pupils reactive.  02/14: Improved mental status but not yet following commands.  02/16: Extubated to nasal cannula.  With right sided weakness in upper and lower extremity and facial twitches. Febrile to 101. Neurology is consulted.  2/18: [facility] assumed care 2/20: Mental status waxes and wanes.  Delirium noted last night   Assessment & Plan:   Principal Problem:   Acute hypoxic respiratory failure (HCC) Active Problems:   Rhabdomyolysis   Aspiration pneumonia (HCC)   AKI (acute kidney injury) (HCC)   Chronic obstructive pulmonary disease (HCC)   Chronic pain syndrome   Post-traumatic stress disorder   Gastroesophageal reflux disease with esophagitis   High cholesterol   Moderate persistent asthma without complication   Tobacco use disorder   Acute respiratory failure (HCC)  # Unintentional opioid overdose # Opioid use disorder # Chronic pain Denies intentional overdose, denies SI - will need close outpatient f/u with pain medicine and plan to wean off opioids vs suboxone/subutex or other OUD treatment - continue home prn oxy for now - cont home gabapentin, baclofen (baclofen at reduced dose)  # Acute mebabolic encephalopathy Noted to be more confused on 2/20, wandering, removing IV access Neuro workup reassuring Mental status improving Plan: Continue home psych regimen Educate patient on importance of adherence to prescribed regimen Delirium precautions, frequent reorienting measures Mentation has been improving   # GAD - cont klonopin - cont home buspar   # Hypokalemia - replete and monitor as needed   # AKI Prerenal 2/2 overdose, resolved   # Loose stool Resolved   # Aspiration pneumonia # Acute  hypoxic respiratory failure Extubated and now breathing comfortably on room air, most recent cxr no infiltrates. Finished a course of IV abx in the ICU. MRI nothing acute, EEG no seizure -Patient on room air.  Continue to monitor   #  Debility PT advising SNF, TOC is aware and pursuing a bed No bed offers yet.  Patient was educated extensively on the importance of regular working with physical therapy.  She agreed and worked with therapy on 2/21.  I recommended daily interactions with therapy, affective recovery mobilization.  Patient has been willingness to work with therapy services  # Facial twitching # Right upper extremity weakness # Myositis/rhabdomyolysis Patient with severe and persistent right upper extremity weakness.  MRI brain and cervical spine overall unrevealing.  Patient does have diffuse soft tissue edema on right shoulder concerning for myositis.  CK has normalized. Plan: Continue to suspect myositis secondary to rhabdomyolysis.  Upper extremity strength is improving on a daily basis.  Patient is motivated to improve and recover.  She has been actively working with therapy and mobility services on a daily basis.  She will benefit from skilled nursing facility placement.     DVT prophylaxis: SQ lovenox Code Status: FULL Family Communication:Daughter via phone 2/19 Disposition Plan: Status is: Inpatient Remains inpatient appropriate because: Weakness decreased from baseline.  Resolving encephalopathy.  Will need SNF.  Medically appropriate for discharge   Level of care: Med-Surg  Consultants:  None  Procedures:  None  Antimicrobials: None    Subjective: Examined.  No acute events overnight.  No new complaints.  Objective: Vitals:   12/15/23 1534 12/15/23 2350 12/16/23 0518 12/16/23 0739  BP: 119/69 100/60  98/76  Pulse: 90 100  93  Resp: 18 18  20   Temp: 98.4 F (36.9 C) 98.7 F (37.1 C)  98.6 F (37 C)  TempSrc:  Oral  Oral  SpO2: 96% 94%  97%  Weight:   82.7 kg   Height:        Intake/Output Summary (Last 24 hours) at 12/16/2023 1228 Last data filed at 12/15/2023 2151 Gross per 24 hour  Intake 720 ml  Output --  Net 720 ml   Filed Weights   12/13/23 0500 12/14/23 0500  12/16/23 0518  Weight: 84.8 kg 84.8 kg 82.7 kg    Examination:  General exam: NAD.  In good spirits Respiratory system: Lungs clear, normal WOB, RA Cardiovascular system: S1S2, RRR, no murmur Gastrointestinal system: Soft, NT/ND, normal bowel sounds Central nervous system: Alert and oriented x 2.  No focal deficits Extremities: Right upper extremity weak, improved from prior.  Gait intact Skin: No rashes, lesions or ulcers Psychiatry: Judgement and insight appear impaired. Mood & affect flattened/confused.     Data Reviewed: I have personally reviewed following labs and imaging studies  CBC: Recent Labs  Lab 12/10/23 0648 12/11/23 0606  WBC 10.8* 11.6*  NEUTROABS  --  8.1*  HGB 12.7 12.9  HCT 37.1 38.9  MCV 90.7 93.7  PLT 206 243   Basic Metabolic Panel: Recent Labs  Lab 12/10/23 0648 12/10/23 0822 12/11/23 0606 12/12/23 0805 12/13/23 0642  NA  --  136 135 138 138  K  --  3.1* 3.0* 3.7 3.6  CL  --  100 103 106 108  CO2  --  24 21* 22 23  GLUCOSE  --  117* 193* 119* 112*  BUN  --  21 19 13 11   CREATININE  --  0.60 0.71 0.59 0.58  CALCIUM  --  9.0 9.1 8.9 8.7*  MG 2.0  --   --   --  2.2  PHOS  --  2.8  --   --  3.7   GFR: Estimated Creatinine Clearance: 80.1 mL/min (by C-G formula based on SCr of 0.58 mg/dL). Liver Function Tests: No results for input(s): "AST", "ALT", "ALKPHOS", "BILITOT", "PROT", "ALBUMIN" in the last 168 hours.  No results for input(s): "LIPASE", "AMYLASE" in the last 168 hours. No results for input(s): "AMMONIA" in the last 168 hours.  Coagulation Profile: No results for input(s): "INR", "PROTIME" in the last 168 hours.  Cardiac Enzymes: Recent Labs  Lab 12/11/23 0606  CKTOTAL 122   BNP (last 3 results) No results for input(s): "PROBNP" in the last 8760 hours. HbA1C: No results for input(s): "HGBA1C" in the last 72 hours. CBG: Recent Labs  Lab 12/09/23 2010 12/09/23 2322 12/10/23 0413 12/10/23 0831  GLUCAP 131* 116* 117*  108*   Lipid Profile: No results for input(s): "CHOL", "HDL", "LDLCALC", "TRIG", "CHOLHDL", "LDLDIRECT" in the last 72 hours. Thyroid Function Tests: No results for input(s): "TSH", "T4TOTAL", "FREET4", "T3FREE", "THYROIDAB" in the last 72 hours. Anemia Panel: No results for input(s): "VITAMINB12", "FOLATE", "FERRITIN", "TIBC", "IRON", "RETICCTPCT" in the last 72 hours.  Sepsis Labs: No results for input(s): "PROCALCITON", "LATICACIDVEN" in the last 168 hours.   Recent Results (from the past 240 hours)  Culture, Respiratory w Gram Stain     Status: None   Collection Time: 12/06/23  3:11 PM   Specimen: Tracheal Aspirate  Result Value Ref Range Status   Specimen Description   Final    TRACHEAL ASPIRATE Performed at [facility], [address] [place], [place] [address]    Special Requests   Final    NONE Performed at [facility], [address] [place], [place] [address]    Gram Stain   Final    NO WBC SEEN NO ORGANISMS SEEN Performed at [facility], [address], [place], [place] [address]    Culture FEW CANDIDA ALBICANS  Final   Report Status 12/09/2023 FINAL  Final  Culture, blood (Routine X 2) w Reflex to ID Panel     Status: None   Collection Time: 12/08/23  9:42 AM   Specimen: BLOOD  Result Value Ref Range Status   Specimen Description BLOOD BLOOD LEFT HAND  Final   Special Requests   Final    BOTTLES DRAWN AEROBIC AND ANAEROBIC Blood Culture results may not be optimal due to an inadequate volume of blood received in culture bottles   Culture   Final    NO GROWTH 5 DAYS Performed at [facility], [address] [place], [place] [address]    Report Status 12/13/2023 FINAL  Final  Culture, blood (Routine X 2) w Reflex to ID Panel     Status: None   Collection Time: 12/08/23  9:42 AM   Specimen: BLOOD  Result Value Ref Range Status   Specimen Description BLOOD BLOOD RIGHT HAND  Final   Special Requests   Final    BOTTLES  DRAWN AEROBIC AND ANAEROBIC Blood Culture results may not be optimal due to an inadequate volume of blood received in culture bottles   Culture   Final    NO GROWTH 5 DAYS Performed at [facility], [address] [place], [place] [address]    Report Status 12/13/2023 FINAL  Final  Gastrointestinal Panel by PCR , Stool     Status: None  Collection Time: 12/11/23  6:52 PM   Specimen: Stool  Result Value Ref Range Status   Campylobacter species NOT DETECTED NOT DETECTED Final   Plesimonas shigelloides NOT DETECTED NOT DETECTED Final   Salmonella species NOT DETECTED NOT DETECTED Final   Yersinia enterocolitica NOT DETECTED NOT DETECTED Final   Vibrio species NOT DETECTED NOT DETECTED Final   Vibrio cholerae NOT DETECTED NOT DETECTED Final   Enteroaggregative E coli (EAEC) NOT DETECTED NOT DETECTED Final   Enteropathogenic E coli (EPEC) NOT DETECTED NOT DETECTED Final   Enterotoxigenic E coli (ETEC) NOT DETECTED NOT DETECTED Final   Shiga like toxin producing E coli (STEC) NOT DETECTED NOT DETECTED Final   Shigella/Enteroinvasive E coli (EIEC) NOT DETECTED NOT DETECTED Final   Cryptosporidium NOT DETECTED NOT DETECTED Final   Cyclospora cayetanensis NOT DETECTED NOT DETECTED Final   Entamoeba histolytica NOT DETECTED NOT DETECTED Final   Giardia lamblia NOT DETECTED NOT DETECTED Final   Adenovirus F40/41 NOT DETECTED NOT DETECTED Final   Astrovirus NOT DETECTED NOT DETECTED Final   Norovirus GI/GII NOT DETECTED NOT DETECTED Final   Rotavirus A NOT DETECTED NOT DETECTED Final   Sapovirus (I, II, IV, and V) NOT DETECTED NOT DETECTED Final    Comment: Performed at [facility], [address], [place], [place] [address]         Radiology Studies: No results found.       Scheduled Meds:  baclofen  5 mg Oral BID   busPIRone  10 mg Oral TID   enoxaparin (LOVENOX) injection  40 mg Subcutaneous Q24H   feeding supplement  237 mL Oral TID BM   folic acid  1  mg Oral Daily   gabapentin  300 mg Oral QID   Gerhardt's butt cream   Topical BID   hydrOXYzine  25 mg Oral Once   montelukast  10 mg Oral QHS   nystatin   Topical TID   oxymetazoline  1 spray Each Nare BID   pantoprazole  40 mg Oral Daily   prazosin  1 mg Oral QHS   QUEtiapine  100 mg Oral Daily   QUEtiapine  200 mg Oral QHS   thiamine  100 mg Oral Daily   Or   thiamine (VITAMIN B1) injection  100 mg Intravenous Daily   Continuous Infusions:   LOS: 12 days    Tresa Moore, MD [facility]   If 7PM-7AM, please contact night-coverage  12/16/2023, 12:28 PM

## 2023-12-17 ENCOUNTER — Inpatient Hospital Stay (HOSPITAL_BASED_OUTPATIENT_CLINIC_OR_DEPARTMENT_OTHER): Payer: Medicare HMO | Admitting: Anesthesiology

## 2023-12-17 ENCOUNTER — Encounter: Payer: Self-pay | Admitting: Anesthesiology

## 2023-12-17 ENCOUNTER — Telehealth: Payer: Self-pay | Admitting: Anesthesiology

## 2023-12-17 DIAGNOSIS — J9601 Acute respiratory failure with hypoxia: Secondary | ICD-10-CM | POA: Diagnosis not present

## 2023-12-17 DIAGNOSIS — N179 Acute kidney failure, unspecified: Secondary | ICD-10-CM

## 2023-12-17 DIAGNOSIS — G8929 Other chronic pain: Secondary | ICD-10-CM

## 2023-12-17 DIAGNOSIS — M545 Low back pain, unspecified: Secondary | ICD-10-CM

## 2023-12-17 DIAGNOSIS — J9600 Acute respiratory failure, unspecified whether with hypoxia or hypercapnia: Secondary | ICD-10-CM

## 2023-12-17 DIAGNOSIS — J690 Pneumonitis due to inhalation of food and vomit: Secondary | ICD-10-CM

## 2023-12-17 DIAGNOSIS — Q675 Congenital deformity of spine: Secondary | ICD-10-CM

## 2023-12-17 DIAGNOSIS — G894 Chronic pain syndrome: Secondary | ICD-10-CM

## 2023-12-17 DIAGNOSIS — F1190 Opioid use, unspecified, uncomplicated: Secondary | ICD-10-CM

## 2023-12-17 DIAGNOSIS — M5431 Sciatica, right side: Secondary | ICD-10-CM

## 2023-12-17 NOTE — Telephone Encounter (Signed)
 PT called stated that she is in [facility]. PT stated that she fall at home and that she isn't safe in that home. PT also stated that she will be going to a rehab center, when she leaves the hospital. PT stated that she doesn't know where her phone is at will like to see if [name] will give her a call , she is in room 153. TY

## 2023-12-17 NOTE — TOC Progression Note (Signed)
 Transition of Care [facility]) - Progression Note    Patient Details  Name: Tami Hopkins MRN: [id number] Date of Birth: 1961/06/03  [facility] CM/SW Contact  Marlowe Sax, RN Phone Number: 12/17/2023, 11:55 AM  Clinical Narrative:      Request[facility] to review for potential bed offer      Expected Discharge Plan and Services                                               Social Determinants of Health (SDOH) Interventions SDOH Screenings   Food Insecurity: Patient Unable To Answer (12/05/2023)  Recent Concern: Food Insecurity - Food Insecurity Present (10/10/2023)  Housing: Patient Unable To Answer (12/05/2023)  Recent Concern: Housing - High Risk (10/09/2023)  Transportation Needs: Patient Unable To Answer (12/05/2023)  Utilities: Patient Unable To Answer (12/05/2023)  Depression (PHQ2-9): Low Risk  (11/21/2023)  Financial Resource Strain: Medium Risk (09/25/2022)   Received from [facility], [facility]  Physical Activity: Insufficiently Active (01/13/2021)   Received from [facility], [facility]  Social Connections: Socially Isolated (01/13/2021)   Received from [facility], [facility]  Stress: Stress Concern Present (01/13/2021)   Received from [facility], [facility]  Tobacco Use: High Risk (12/04/2023)    Readmission Risk Interventions     No data to display

## 2023-12-17 NOTE — Patient Instructions (Signed)

## 2023-12-17 NOTE — Plan of Care (Signed)

## 2023-12-17 NOTE — Progress Notes (Signed)
 I checked on Tami Hopkins today as an inpatient nonconsultative visit.  This was for social purpose to see how she was doing.  She seems to be making a significant recovery following a very difficult experience.  She still has multiple social issues to accommodate following her rehabilitative course.  At present she is doing well with gabapentin and baclofen and requiring no additional opioid coverage.  Dr. [name]

## 2023-12-17 NOTE — Progress Notes (Signed)
 PROGRESS NOTE    Tami Hopkins  IHK:[id number] DOB: 1961/06/15 DOA: 12/04/2023 PCP: Ardyth Man, PA-C    Brief Narrative:   Tami Hopkins is a 63 y.o. female with a PMH significant for HLD, COPD, Asthma, Tobacco use, Chronic pain syndrome, Anxiety, PTSD, IBS, and GERD who presents to the ED for altered mental status.  Per chart review, family had not heard from the patient for the past 2 days and called for a wellness check.  Patient subsequently found unresponsive sitting on the couch, roommate stated that they thought patient had been sleeping on the couch for the past few days.  Of note, patient was admitted to the hospital a couple of months ago for altered mental status due to suspected polypharmacy use.  Upon EMS arrival patient was given 2 mg of intranasal Narcan with no effect, subsequently given 2 mg IV Narcan with slight respiratory improvement, yet remained unresponsive.  Upon arrival to the ER, BP 94/78, afebrile, 100% on room air, she was noted to have vomit in her airway during suctioning. She was intubated for airway protection. Labs revealed K 6.0, Creat 3.72, BUN 62, AST 162, ALT 85, CK 4155, Troponin 91 and 74, lactic acid 1.6, WBC 19.0, Drug screen + benzodiazepine, opiate, tricyclics. She was given a total of 3 liters of IVF, started on broad spectrum antibiotics, cultures sent, and started on Norepinephrine infusion. CT c-spine without noted fracture, CT head without acute pathology, CT A/P/T bilateral atelectasis, stable non obstructing right renal calculi, hepatic steatosis, aortic atherosclerosis, no acute intra abdominal process.     PCCM was consulted for Medical Management in this critically ill patient in septic shock and respiratory failure suspected due to polysubstance abuse and aspiration Pneumonia   2/12: Admitted with AMS due to polysubstance abuse. Septic shock, levophed, cultures sent, intubated  02/13: Brainstem reflexes improved today with regain of  cough, gag, pupils reactive.  02/14: Improved mental status but not yet following commands.  02/16: Extubated to nasal cannula.  With right sided weakness in upper and lower extremity and facial twitches. Febrile to 101. Neurology is consulted.  2/18: [facility] assumed care 2/20: Mental status waxes and wanes.  Delirium noted last night 2/25: Mental status improved.  Motivated and working with therapy   Assessment & Plan:   Principal Problem:   Acute hypoxic respiratory failure (HCC) Active Problems:   Rhabdomyolysis   Aspiration pneumonia (HCC)   AKI (acute kidney injury) (HCC)   Chronic obstructive pulmonary disease (HCC)   Chronic pain syndrome   Post-traumatic stress disorder   Gastroesophageal reflux disease with esophagitis   High cholesterol   Moderate persistent asthma without complication   Tobacco use disorder   Acute respiratory failure (HCC)  # Unintentional opioid overdose # Opioid use disorder # Chronic pain Denies intentional overdose, denies SI Plan: - will need close outpatient f/u with pain medicine and plan to wean off opioids vs suboxone/subutex or other OUD treatment - continue home prn oxy for now - cont home gabapentin, baclofen (baclofen at reduced dose)  # Acute mebabolic encephalopathy Noted to be more confused on 2/20, wandering, removing IV access Neuro workup reassuring Mental status improving Appears to be at baseline Plan: Continue home psych regimen Educate patient on importance of adherence to prescribed regimen Delirium precautions, frequent reorienting measures Mentation has been improving   # GAD - cont klonopin - cont home buspar   # Hypokalemia - replete and monitor as needed   # AKI Prerenal  2/2 overdose, resolved   # Loose stool Resolved   # Aspiration pneumonia # Acute hypoxic respiratory failure Extubated and now breathing comfortably on room air, most recent cxr no infiltrates. Finished a course of IV abx  in the ICU. MRI nothing acute, EEG no seizure -Patient on room air.  Continue to monitor   # Debility PT advising SNF, TOC is aware and pursuing a bed No bed offers yet.  Patient was educated extensively on the importance of regular working with physical therapy.  She agreed and worked with therapy on 2/21.  I recommended daily interactions with therapy, effective recovery mobilization.  Patient has been willingness to work with therapy services  # Facial twitching # Right upper extremity weakness # Myositis/rhabdomyolysis Patient with severe and persistent right upper extremity weakness.  MRI brain and cervical spine overall unrevealing.  Patient does have diffuse soft tissue edema on right shoulder concerning for myositis.  CK has normalized. Plan: Continue to suspect myositis secondary to rhabdomyolysis.  Upper extremity strength is improving on a daily basis.  Patient is motivated to improve and recover.  She has been actively working with therapy and mobility services on a daily basis.  She will benefit from skilled nursing facility placement.     DVT prophylaxis: SQ lovenox Code Status: FULL Family Communication:Daughter via phone 2/19.  Offered to call 2/25 however patient declined Disposition Plan: Status is: Inpatient Remains inpatient appropriate because: Weakness decreased from baseline.  Resolving encephalopathy.  Will need SNF.  She is medically ready for discharge at this time   Level of care: Med-Surg  Consultants:  None  Procedures:  None  Antimicrobials: None    Subjective: Seen and examined.  Intermittently tearful today.  Appears motivated and strength improving.  Objective: Vitals:   12/16/23 2246 12/16/23 2316 12/17/23 0500 12/17/23 0729  BP: 110/77 98/65  93/65  Pulse:  95  92  Resp:  18  18  Temp:  98.1 F (36.7 C)  98.1 F (36.7 C)  TempSrc:      SpO2:  94%  93%  Weight:   81.1 kg   Height:       No intake or output data in the 24 hours  ending 12/17/23 1343  Filed Weights   12/14/23 0500 12/16/23 0518 12/17/23 0500  Weight: 84.8 kg 82.7 kg 81.1 kg    Examination:  General exam: No acute distress.  Fatigued but in good spirits Respiratory system: Lungs clear, normal WOB, RA Cardiovascular system: S1S2, RRR, no murmur Gastrointestinal system: Soft, NT/ND, normal bowel sounds Central nervous system: Alert and oriented x 3.  No focal deficits Extremities: Right upper extremity weak, improved from prior.  Gait intact Skin: No rashes, lesions or ulcers Psychiatry: Judgement and insight appear impaired. Mood & affect flattened/confused.     Data Reviewed: I have personally reviewed following labs and imaging studies  CBC: Recent Labs  Lab 12/11/23 0606  WBC 11.6*  NEUTROABS 8.1*  HGB 12.9  HCT 38.9  MCV 93.7  PLT 243   Basic Metabolic Panel: Recent Labs  Lab 12/11/23 0606 12/12/23 0805 12/13/23 0642  NA 135 138 138  K 3.0* 3.7 3.6  CL 103 106 108  CO2 21* 22 23  GLUCOSE 193* 119* 112*  BUN 19 13 11   CREATININE 0.71 0.59 0.58  CALCIUM 9.1 8.9 8.7*  MG  --   --  2.2  PHOS  --   --  3.7   GFR: Estimated Creatinine Clearance: 79.4 mL/min (  by C-G formula based on SCr of 0.58 mg/dL). Liver Function Tests: No results for input(s): "AST", "ALT", "ALKPHOS", "BILITOT", "PROT", "ALBUMIN" in the last 168 hours.  No results for input(s): "LIPASE", "AMYLASE" in the last 168 hours. No results for input(s): "AMMONIA" in the last 168 hours.  Coagulation Profile: No results for input(s): "INR", "PROTIME" in the last 168 hours.  Cardiac Enzymes: Recent Labs  Lab 12/11/23 0606  CKTOTAL 122   BNP (last 3 results) No results for input(s): "PROBNP" in the last 8760 hours. HbA1C: No results for input(s): "HGBA1C" in the last 72 hours. CBG: No results for input(s): "GLUCAP" in the last 168 hours.  Lipid Profile: No results for input(s): "CHOL", "HDL", "LDLCALC", "TRIG", "CHOLHDL", "LDLDIRECT" in the last  72 hours. Thyroid Function Tests: No results for input(s): "TSH", "T4TOTAL", "FREET4", "T3FREE", "THYROIDAB" in the last 72 hours. Anemia Panel: No results for input(s): "VITAMINB12", "FOLATE", "FERRITIN", "TIBC", "IRON", "RETICCTPCT" in the last 72 hours.  Sepsis Labs: No results for input(s): "PROCALCITON", "LATICACIDVEN" in the last 168 hours.   Recent Results (from the past 240 hours)  Culture, blood (Routine X 2) w Reflex to ID Panel     Status: None   Collection Time: 12/08/23  9:42 AM   Specimen: BLOOD  Result Value Ref Range Status   Specimen Description BLOOD BLOOD LEFT HAND  Final   Special Requests   Final    BOTTLES DRAWN AEROBIC AND ANAEROBIC Blood Culture results may not be optimal due to an inadequate volume of blood received in culture bottles   Culture   Final    NO GROWTH 5 DAYS Performed at [facility], [address] [place], [place] [address]    Report Status 12/13/2023 FINAL  Final  Culture, blood (Routine X 2) w Reflex to ID Panel     Status: None   Collection Time: 12/08/23  9:42 AM   Specimen: BLOOD  Result Value Ref Range Status   Specimen Description BLOOD BLOOD RIGHT HAND  Final   Special Requests   Final    BOTTLES DRAWN AEROBIC AND ANAEROBIC Blood Culture results may not be optimal due to an inadequate volume of blood received in culture bottles   Culture   Final    NO GROWTH 5 DAYS Performed at [facility], [address] [place], [place] [address]    Report Status 12/13/2023 FINAL  Final  Gastrointestinal Panel by PCR , Stool     Status: None   Collection Time: 12/11/23  6:52 PM   Specimen: Stool  Result Value Ref Range Status   Campylobacter species NOT DETECTED NOT DETECTED Final   Plesimonas shigelloides NOT DETECTED NOT DETECTED Final   Salmonella species NOT DETECTED NOT DETECTED Final   Yersinia enterocolitica NOT DETECTED NOT DETECTED Final   Vibrio species NOT DETECTED NOT DETECTED Final   Vibrio cholerae NOT  DETECTED NOT DETECTED Final   Enteroaggregative E coli (EAEC) NOT DETECTED NOT DETECTED Final   Enteropathogenic E coli (EPEC) NOT DETECTED NOT DETECTED Final   Enterotoxigenic E coli (ETEC) NOT DETECTED NOT DETECTED Final   Shiga like toxin producing E coli (STEC) NOT DETECTED NOT DETECTED Final   Shigella/Enteroinvasive E coli (EIEC) NOT DETECTED NOT DETECTED Final   Cryptosporidium NOT DETECTED NOT DETECTED Final   Cyclospora cayetanensis NOT DETECTED NOT DETECTED Final   Entamoeba histolytica NOT DETECTED NOT DETECTED Final   Giardia lamblia NOT DETECTED NOT DETECTED Final   Adenovirus F40/41 NOT DETECTED NOT DETECTED  Final   Astrovirus NOT DETECTED NOT DETECTED Final   Norovirus GI/GII NOT DETECTED NOT DETECTED Final   Rotavirus A NOT DETECTED NOT DETECTED Final   Sapovirus (I, II, IV, and V) NOT DETECTED NOT DETECTED Final    Comment: Performed at [facility], [address] [place], [place] [address]         Radiology Studies: No results found.       Scheduled Meds:  baclofen  5 mg Oral BID   busPIRone  10 mg Oral TID   enoxaparin (LOVENOX) injection  40 mg Subcutaneous Q24H   feeding supplement  237 mL Oral TID BM   folic acid  1 mg Oral Daily   gabapentin  300 mg Oral QID   Gerhardt's butt cream   Topical BID   hydrOXYzine  25 mg Oral Once   montelukast  10 mg Oral QHS   nystatin   Topical TID   oxymetazoline  1 spray Each Nare BID   pantoprazole  40 mg Oral Daily   prazosin  1 mg Oral QHS   QUEtiapine  100 mg Oral Daily   QUEtiapine  200 mg Oral QHS   thiamine  100 mg Oral Daily   Or   thiamine (VITAMIN B1) injection  100 mg Intravenous Daily   Continuous Infusions:   LOS: 13 days    Tresa Moore, MD [facility]   If 7PM-7AM, please contact night-coverage  12/17/2023, 1:43 PM

## 2023-12-18 DIAGNOSIS — J9601 Acute respiratory failure with hypoxia: Secondary | ICD-10-CM | POA: Diagnosis not present

## 2023-12-18 MED ORDER — BENZOCAINE 10 % MT GEL
Freq: Four times a day (QID) | OROMUCOSAL | Status: DC | PRN
  Filled 2023-12-18: qty 9

## 2023-12-18 MED ORDER — IBUPROFEN 400 MG PO TABS
400.0000 mg | ORAL_TABLET | Freq: Once | ORAL | Status: AC
  Administered 2023-12-18: 400 mg via ORAL
  Filled 2023-12-18: qty 1

## 2023-12-18 NOTE — Progress Notes (Signed)
 PROGRESS NOTE    Tami Hopkins  ZOX:[id number] DOB: 1961/05/17 DOA: 12/04/2023 PCP: Ardyth Man, PA-C    Brief Narrative:   Tami Hopkins is a 63 y.o. female with a PMH significant for HLD, COPD, Asthma, Tobacco use, Chronic pain syndrome, Anxiety, PTSD, IBS, and GERD who presents to the ED for altered mental status.  Per chart review, family had not heard from the patient for the past 2 days and called for a wellness check.  Patient subsequently found unresponsive sitting on the couch, roommate stated that they thought patient had been sleeping on the couch for the past few days.  Of note, patient was admitted to the hospital a couple of months ago for altered mental status due to suspected polypharmacy use.  Upon EMS arrival patient was given 2 mg of intranasal Narcan with no effect, subsequently given 2 mg IV Narcan with slight respiratory improvement, yet remained unresponsive.  Upon arrival to the ER, BP 94/78, afebrile, 100% on room air, she was noted to have vomit in her airway during suctioning. She was intubated for airway protection. Labs revealed K 6.0, Creat 3.72, BUN 62, AST 162, ALT 85, CK 4155, Troponin 91 and 74, lactic acid 1.6, WBC 19.0, Drug screen + benzodiazepine, opiate, tricyclics. She was given a total of 3 liters of IVF, started on broad spectrum antibiotics, cultures sent, and started on Norepinephrine infusion. CT c-spine without noted fracture, CT head without acute pathology, CT A/P/T bilateral atelectasis, stable non obstructing right renal calculi, hepatic steatosis, aortic atherosclerosis, no acute intra abdominal process.     PCCM was consulted for Medical Management in this critically ill patient in septic shock and respiratory failure suspected due to polysubstance abuse and aspiration Pneumonia   2/12: Admitted with AMS due to polysubstance abuse. Septic shock, levophed, cultures sent, intubated  02/13: Brainstem reflexes improved today with regain of  cough, gag, pupils reactive.  02/14: Improved mental status but not yet following commands.  02/16: Extubated to nasal cannula.  With right sided weakness in upper and lower extremity and facial twitches. Febrile to 101. Neurology is consulted.  2/18: [facility] assumed care 2/20: Mental status waxes and wanes.  Delirium noted last night 2/25: Mental status improved.  Motivated and working with therapy   Assessment & Plan:   Principal Problem:   Acute hypoxic respiratory failure (HCC) Active Problems:   Rhabdomyolysis   Aspiration pneumonia (HCC)   AKI (acute kidney injury) (HCC)   Chronic obstructive pulmonary disease (HCC)   Chronic pain syndrome   Post-traumatic stress disorder   Gastroesophageal reflux disease with esophagitis   High cholesterol   Moderate persistent asthma without complication   Tobacco use disorder   Acute respiratory failure (HCC)  # Unintentional opioid overdose # Opioid use disorder # Chronic pain Denies intentional overdose, denies SI Plan: - will need close outpatient f/u with pain medicine and plan to wean off opioids vs suboxone/subutex or other OUD treatment - continue home prn oxy for now - cont home gabapentin, baclofen (baclofen at reduced dose)  # Acute mebabolic encephalopathy Noted to be more confused on 2/20, wandering, removing IV access Neuro workup reassuring Mental status improving Appears to be at baseline Plan: Continue home psych regimen Educate patient on importance of adherence to prescribed regimen Delirium precautions, frequent reorienting measures Mentation has been improving   # GAD - cont klonopin - cont home buspar   # Hypokalemia - replete and monitor as needed   # AKI Prerenal  2/2 overdose, resolved   # Loose stool Resolved   # Aspiration pneumonia # Acute hypoxic respiratory failure Extubated and now breathing comfortably on room air, most recent cxr no infiltrates. Finished a course of IV abx  in the ICU. MRI nothing acute, EEG no seizure -Patient on room air.  Continue to monitor   # Debility PT advising SNF, TOC is aware and pursuing a bed No bed offers yet.  Patient was educated extensively on the importance of regular working with physical therapy.  She agreed and worked with therapy on 2/21.  I recommended daily interactions with therapy, effective recovery mobilization.  Patient has been willingness to work with therapy services  # Facial twitching # Right upper extremity weakness # Myositis/rhabdomyolysis Patient with severe and persistent right upper extremity weakness.  MRI brain and cervical spine overall unrevealing.  Patient does have diffuse soft tissue edema on right shoulder concerning for myositis.  CK has normalized. Plan: Continue to suspect myositis secondary to rhabdomyolysis.  Upper extremity strength is improving on a daily basis.  Patient is motivated to improve and recover.  She has been actively working with therapy and mobility services on a daily basis.  She will benefit from skilled nursing facility placement.     DVT prophylaxis: SQ lovenox Code Status: FULL Family Communication:Daughter via phone 2/19.  Offered to call 2/25 however patient declined Disposition Plan: Status is: Inpatient Remains inpatient appropriate because: Weakness decreased from baseline.  Resolving encephalopathy.  Will need SNF.  She is medically ready for discharge at this time   Level of care: Med-Surg  Consultants:  PCCM Neurology  Procedures:  S/p extubation on 2/16  Antimicrobials: None    Subjective: No significant overnight events, patient was seen and examined at bedside during morning rounds. Patient had a tongue bite and was complaining of headache, which resolved after 1 dose of ibuprofen.  Orajel was given for tongue bite. Patient still has pain in the right arm which is gradually getting better, strength is 4/5. Patient denies any other specific  complaints.  She is waiting for SNF placement.   Objective: Vitals:   12/17/23 2222 12/18/23 0500 12/18/23 0802 12/18/23 1549  BP: 105/71  104/77 123/86  Pulse: 93  88 90  Resp: 18  16 16   Temp: 98.7 F (37.1 C)  97.9 F (36.6 C) 98.2 F (36.8 C)  TempSrc:   Oral Oral  SpO2: 94%  96% 98%  Weight:  81.2 kg    Height:        Intake/Output Summary (Last 24 hours) at 12/18/2023 1658 Last data filed at 12/17/2023 1921 Gross per 24 hour  Intake 480 ml  Output --  Net 480 ml    Filed Weights   12/16/23 0518 12/17/23 0500 12/18/23 0500  Weight: 82.7 kg 81.1 kg 81.2 kg    Examination:  General exam: No acute distress.  Fatigued but in good spirits Respiratory system: Lungs clear, normal WOB, RA Cardiovascular system: S1S2, RRR, no murmur Gastrointestinal system: Soft, NT/ND, normal bowel sounds Central nervous system: Alert and oriented x 3.  No focal deficits Extremities: Right upper extremity weak, improved from prior.  Gait intact Skin: No rashes, lesions or ulcers Psychiatry: Judgement and insight appear impaired. Mood & affect flattened/confused.     Data Reviewed: I have personally reviewed following labs and imaging studies  CBC: No results for input(s): "WBC", "NEUTROABS", "HGB", "HCT", "MCV", "PLT" in the last 168 hours.  Basic Metabolic Panel: Recent Labs  Lab  12/12/23 0805 12/13/23 0642  NA 138 138  K 3.7 3.6  CL 106 108  CO2 22 23  GLUCOSE 119* 112*  BUN 13 11  CREATININE 0.59 0.58  CALCIUM 8.9 8.7*  MG  --  2.2  PHOS  --  3.7   GFR: Estimated Creatinine Clearance: 79.4 mL/min (by C-G formula based on SCr of 0.58 mg/dL). Liver Function Tests: No results for input(s): "AST", "ALT", "ALKPHOS", "BILITOT", "PROT", "ALBUMIN" in the last 168 hours.  No results for input(s): "LIPASE", "AMYLASE" in the last 168 hours. No results for input(s): "AMMONIA" in the last 168 hours.  Coagulation Profile: No results for input(s): "INR", "PROTIME" in the last  168 hours.  Cardiac Enzymes: No results for input(s): "CKTOTAL", "CKMB", "CKMBINDEX", "TROPONINI" in the last 168 hours.  BNP (last 3 results) No results for input(s): "PROBNP" in the last 8760 hours. HbA1C: No results for input(s): "HGBA1C" in the last 72 hours. CBG: No results for input(s): "GLUCAP" in the last 168 hours.  Lipid Profile: No results for input(s): "CHOL", "HDL", "LDLCALC", "TRIG", "CHOLHDL", "LDLDIRECT" in the last 72 hours. Thyroid Function Tests: No results for input(s): "TSH", "T4TOTAL", "FREET4", "T3FREE", "THYROIDAB" in the last 72 hours. Anemia Panel: No results for input(s): "VITAMINB12", "FOLATE", "FERRITIN", "TIBC", "IRON", "RETICCTPCT" in the last 72 hours.  Sepsis Labs: No results for input(s): "PROCALCITON", "LATICACIDVEN" in the last 168 hours.   Recent Results (from the past 240 hours)  Gastrointestinal Panel by PCR , Stool     Status: None   Collection Time: 12/11/23  6:52 PM   Specimen: Stool  Result Value Ref Range Status   Campylobacter species NOT DETECTED NOT DETECTED Final   Plesimonas shigelloides NOT DETECTED NOT DETECTED Final   Salmonella species NOT DETECTED NOT DETECTED Final   Yersinia enterocolitica NOT DETECTED NOT DETECTED Final   Vibrio species NOT DETECTED NOT DETECTED Final   Vibrio cholerae NOT DETECTED NOT DETECTED Final   Enteroaggregative E coli (EAEC) NOT DETECTED NOT DETECTED Final   Enteropathogenic E coli (EPEC) NOT DETECTED NOT DETECTED Final   Enterotoxigenic E coli (ETEC) NOT DETECTED NOT DETECTED Final   Shiga like toxin producing E coli (STEC) NOT DETECTED NOT DETECTED Final   Shigella/Enteroinvasive E coli (EIEC) NOT DETECTED NOT DETECTED Final   Cryptosporidium NOT DETECTED NOT DETECTED Final   Cyclospora cayetanensis NOT DETECTED NOT DETECTED Final   Entamoeba histolytica NOT DETECTED NOT DETECTED Final   Giardia lamblia NOT DETECTED NOT DETECTED Final   Adenovirus F40/41 NOT DETECTED NOT DETECTED Final    Astrovirus NOT DETECTED NOT DETECTED Final   Norovirus GI/GII NOT DETECTED NOT DETECTED Final   Rotavirus A NOT DETECTED NOT DETECTED Final   Sapovirus (I, II, IV, and V) NOT DETECTED NOT DETECTED Final    Comment: Performed at [facility], [address], [place], [place] [address]         Radiology Studies: No results found.       Scheduled Meds:  baclofen  5 mg Oral BID   busPIRone  10 mg Oral TID   enoxaparin (LOVENOX) injection  40 mg Subcutaneous Q24H   feeding supplement  237 mL Oral TID BM   folic acid  1 mg Oral Daily   gabapentin  300 mg Oral QID   Gerhardt's butt cream   Topical BID   hydrOXYzine  25 mg Oral Once   montelukast  10 mg Oral QHS   nystatin   Topical TID   pantoprazole  40 mg Oral Daily   prazosin  1 mg Oral QHS   QUEtiapine  100 mg Oral Daily   QUEtiapine  200 mg Oral QHS   thiamine  100 mg Oral Daily   Or   thiamine (VITAMIN B1) injection  100 mg Intravenous Daily   Continuous Infusions:   LOS: 14 days    Gillis Santa, MD [facility]   If 7PM-7AM, please contact night-coverage  12/18/2023, 4:58 PM

## 2023-12-18 NOTE — TOC Progression Note (Signed)
 Transition of Care [facility]) - Progression Note    Patient Details  Name: Tami Hopkins MRN: [id number] Date of Birth: 07/31/1961  [facility] CM/SW Contact  Marlowe Sax, RN Phone Number: 12/18/2023, 4:39 PM  Clinical Narrative:      Sherron Monday with Katie the daughter and explained that due to not having a stable DC plan she would not be able to go to Inpatient rehab, I explained that we do not have bed options, I explained she can not stay here after medically ready and the family will need to come up with a plan, She stated that she will make some calls tomorrow to come up with a plan      Expected Discharge Plan and Services                                               Social Determinants of Health (SDOH) Interventions SDOH Screenings   Food Insecurity: Patient Unable To Answer (12/05/2023)  Recent Concern: Food Insecurity - Food Insecurity Present (10/10/2023)  Housing: Patient Unable To Answer (12/05/2023)  Recent Concern: Housing - High Risk (10/09/2023)  Transportation Needs: Patient Unable To Answer (12/05/2023)  Utilities: Patient Unable To Answer (12/05/2023)  Depression (PHQ2-9): Low Risk  (11/21/2023)  Financial Resource Strain: Medium Risk (09/25/2022)   Received from [facility], [facility]  Physical Activity: Insufficiently Active (01/13/2021)   Received from [facility], [facility]  Social Connections: Socially Isolated (01/13/2021)   Received from [facility], [facility]  Stress: Stress Concern Present (01/13/2021)   Received from [facility], [facility]  Tobacco Use: High Risk (12/17/2023)    Readmission Risk Interventions     No data to display

## 2023-12-18 NOTE — Plan of Care (Signed)

## 2023-12-18 NOTE — Progress Notes (Signed)
 Physical Therapy Treatment Patient Details Name: Tami Hopkins MRN: [id number] DOB: 1960/12/30 Today's Date: 12/18/2023   History of Present Illness This is a 63 year old woman with a history of hyperlipidemia, COPD, asthma, tobacco use, chronic pain syndrome, anxiety, PTSD, IBS, and GERD who presented to the ED for unresponsiveness after being found down at home, possibly for up to 2 days, requiring intubation and admission to the ICU. Etiology favored to be narcotic overdose or polypharmacy.    PT Comments  Pt was long sitting in bed upon arrival. She is A and O x 4. Agreeable to session and motivated and pleasant throughout. Pt was able to exit R side of bed, stand to RW, and tolerate ambulation ~ 200 ft. No LOB with use of RW. Pt endorses having buckling in st session however none observed this date. Overall, pt seems to be progressing quickly. Discussed case with TOC. Acute PT will continue to follow per current POC.    If plan is discharge home, recommend the following: A little help with walking and/or transfers;A little help with bathing/dressing/bathroom;Assistance with cooking/housework;Assist for transportation;Help with stairs or ramp for entrance;Direct supervision/assist for financial management     Equipment Recommendations  Rolling walker (2 wheels);BSC/3in1       Precautions / Restrictions Precautions Precautions: Fall Recall of Precautions/Restrictions: Impaired Restrictions Weight Bearing Restrictions Per Provider Order: No     Mobility  Bed Mobility Overal bed mobility: Needs Assistance Bed Mobility: Supine to Sit, Sit to Supine  Supine to sit: Supervision Sit to supine: Supervision     Transfers Overall transfer level: Needs assistance Equipment used: Rolling walker (2 wheels) Transfers: Sit to/from Stand Sit to Stand: Min assist, Contact guard assist  General transfer comment: min assist at first. CGA for 2nd attempt     Ambulation/Gait Ambulation/Gait assistance: Contact guard assist Gait Distance (Feet): 200 Feet Assistive device: Rolling walker (2 wheels) Gait Pattern/deviations: Step-through pattern Gait velocity: decreased  General Gait Details: pt ambulated 200 ft with RW. No LOB or safety concerns. pt get easily distracted but overall tolerated session well    Balance Overall balance assessment: Needs assistance Sitting-balance support: Feet supported Sitting balance-Leahy Scale: Good     Standing balance support: Bilateral upper extremity supported, During functional activity, Reliant on assistive device for balance Standing balance-Leahy Scale: Fair       Hotel manager: No apparent difficulties  Cognition Arousal: Alert Behavior During Therapy: WFL for tasks assessed/performed   PT - Cognitive impairments: No apparent impairments      PT - Cognition Comments: Pt is A and O x 4 Following commands: Intact      Cueing Cueing Techniques: Verbal cues, Gestural cues, Tactile cues, Visual cues         Pertinent Vitals/Pain Pain Assessment Pain Assessment: 0-10 Pain Score: 4  Pain Location: generalized Pain Descriptors / Indicators: Guarding, Grimacing Pain Intervention(s): Limited activity within patient's tolerance, Monitored during session, Premedicated before session, Repositioned     PT Goals (current goals can now be found in the care plan section) Acute Rehab PT Goals Patient Stated Goal: "Get better so I can eventually go home." Progress towards PT goals: Progressing toward goals    Frequency    Min 1X/week       AM-PAC PT "6 Clicks" Mobility   Outcome Measure  Help needed turning from your back to your side while in a flat bed without using bedrails?: A Little Help needed moving from lying on your back to sitting on  the side of a flat bed without using bedrails?: A Little Help needed moving to and from a bed to a chair (including  a wheelchair)?: A Little Help needed standing up from a chair using your arms (e.g., wheelchair or bedside chair)?: A Little Help needed to walk in hospital room?: A Little Help needed climbing 3-5 steps with a railing? : A Little 6 Click Score: 18    End of Session   Activity Tolerance: Patient tolerated treatment well Patient left: in bed;with call bell/phone within reach;with bed alarm set Nurse Communication: Mobility status PT Visit Diagnosis: Other abnormalities of gait and mobility (R26.89);Difficulty in walking, not elsewhere classified (R26.2);Muscle weakness (generalized) (M62.81)     Time: 1610-9604 PT Time Calculation (min) (ACUTE ONLY): 12 min  Charges:    $Gait Training: 8-22 mins PT General Charges $$ ACUTE PT VISIT: 1 Visit                    Jetta Lout PTA 12/18/23, 12:47 PM

## 2023-12-18 NOTE — Progress Notes (Signed)
 Nutrition Follow-up  DOCUMENTATION CODES:   Not applicable  INTERVENTION:   -Continue regular diet -Continue MVI with minerals daily -Continue Ensure Enlive po TID, each supplement provides 350 kcal and 20 grams of protein  NUTRITION DIAGNOSIS:   Inadequate oral intake related to inability to eat (pt sedated and ventilated) as evidenced by NPO status.  Progressing; advanced to PO diet on 12/08/23  GOAL:   Patient will meet greater than or equal to 90% of their needs  Progressing   MONITOR:   PO intake, Supplement acceptance, Labs, Weight trends, I & O's, Skin  REASON FOR ASSESSMENT:   Consult Enteral/tube feeding initiation and management  ASSESSMENT:   63 y.o. female with h/o HLD, COPD, asthma, tobacco use, DDD. chronic pain syndrome, anxiety, PTSD, IBS and GERD who is admitted with polypharmacy, OD, rhabdomyolysis, aspiration PNA and AKI.  2/16- extubated, advanced to full liquid diet 2/17- rectal tube removed 2/18- advanced to regular diet  Reviewed I/O's: +480 ml x 24 hours and -2.8 L since admission   Spoke with pt at bedside, who was pleasant and in good spirits today. She reports that she is feeling better and is happy that she is no longer having diarrhea. Pt reports fair appetite, but does not want much. Noted pt consumed about 25% of her pancakes. Per pt, she is drinking Ensure. Noted meal completions 50-100%.   Per TOC notes, still awaiting appropriate discharge disposition. Pt has progressed with therapy enough to be able to go home, however, family is unable to take care of her.   Wt has been stable since admission.   Medications reviewed and include lovenox, folic acid, neurontin, protonix, and thiamine.   Labs reviewed: CBGS: 108 (inpatient orders for glycemic control are none).    Diet Order:   Diet Order             Diet regular Room service appropriate? 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  Estimated Nutritional Needs:   Kcal:  1900-2200kcal/day  Protein:  95-110g/day  Fluid:  1.9-2.2L/day    Levada Schilling, RD, LDN, CDCES Registered Dietitian III Certified Diabetes Care and Education Specialist If unable to reach this RD, please use "RD Inpatient" group chat on secure chat between hours of 8am-4 pm daily

## 2023-12-18 NOTE — TOC Progression Note (Signed)
 Transition of Care [facility]) - Progression Note    Patient Details  Name: Tami Hopkins MRN: [id number] Date of Birth: Jun 11, 1961  [facility] CM/SW Contact  Marlowe Sax, RN Phone Number: 12/18/2023, 12:44 PM  Clinical Narrative:     Spoke with daughter Ashely on the phone  She stated that the patient is not able to return to her previous Home due to being evicted They family is not able to help her, One daughter has an infant and can't take care of mom, the other lives in a small apartment and has a room mate, they stated that the patient does not make enough money to pay for her rent on her own and the family can not help her, I explained that they would need to come up with a plan because we are not able to find a rehab bed and she can not stay in the hospital, I explained that Therapy has said she is doing well enough to go home , the daughter Ashely stated she didn't know what to tell me that they can't help her. She had a meetinmg and had to get off the phone       Expected Discharge Plan and Services                                               Social Determinants of Health (SDOH) Interventions SDOH Screenings   Food Insecurity: Patient Unable To Answer (12/05/2023)  Recent Concern: Food Insecurity - Food Insecurity Present (10/10/2023)  Housing: Patient Unable To Answer (12/05/2023)  Recent Concern: Housing - High Risk (10/09/2023)  Transportation Needs: Patient Unable To Answer (12/05/2023)  Utilities: Patient Unable To Answer (12/05/2023)  Depression (PHQ2-9): Low Risk  (11/21/2023)  Financial Resource Strain: Medium Risk (09/25/2022)   Received from [facility], [facility]  Physical Activity: Insufficiently Active (01/13/2021)   Received from [facility], [facility]  Social Connections: Socially Isolated (01/13/2021)   Received from [facility], [facility]  Stress: Stress Concern Present (01/13/2021)   Received from [facility], [facility]  Tobacco Use: High Risk (12/17/2023)    Readmission Risk Interventions     No data to display

## 2023-12-19 DIAGNOSIS — J9601 Acute respiratory failure with hypoxia: Secondary | ICD-10-CM | POA: Diagnosis not present

## 2023-12-19 LAB — BASIC METABOLIC PANEL
Anion gap: 9 (ref 5–15)
BUN: 15 mg/dL (ref 8–23)
CO2: 22 mmol/L (ref 22–32)
Calcium: 9.1 mg/dL (ref 8.9–10.3)
Chloride: 104 mmol/L (ref 98–111)
Creatinine, Ser: 0.59 mg/dL (ref 0.44–1.00)
GFR, Estimated: >60 mL/min (ref >60)
Glucose, Bld: 132 mg/dL — ABNORMAL HIGH (ref 70–99)
Potassium: 3.3 mmol/L — ABNORMAL LOW (ref 3.5–5.1)
Sodium: 135 mmol/L (ref 135–145)

## 2023-12-19 LAB — CBC
HCT: 35.4 % — ABNORMAL LOW (ref 36.0–46.0)
Hemoglobin: 11.8 g/dL — ABNORMAL LOW (ref 12.0–15.0)
MCH: 30.8 pg (ref 26.0–34.0)
MCHC: 33.3 g/dL (ref 30.0–36.0)
MCV: 92.4 fL (ref 80.0–100.0)
Platelets: 370 10*3/uL (ref 150–400)
RBC: 3.83 MIL/uL — ABNORMAL LOW (ref 3.87–5.11)
RDW: 13.5 % (ref 11.5–15.5)
WBC: 6.9 10*3/uL (ref 4.0–10.5)
nRBC: 0 % (ref 0.0–0.2)

## 2023-12-19 LAB — PHOSPHORUS: Phosphorus: 3.8 mg/dL (ref 2.5–4.6)

## 2023-12-19 LAB — MAGNESIUM: Magnesium: 2 mg/dL (ref 1.7–2.4)

## 2023-12-19 MED ORDER — POTASSIUM CHLORIDE CRYS ER 20 MEQ PO TBCR
40.0000 meq | EXTENDED_RELEASE_TABLET | Freq: Once | ORAL | Status: AC
  Administered 2023-12-19: 40 meq via ORAL
  Filled 2023-12-19: qty 2

## 2023-12-19 NOTE — Progress Notes (Signed)
 Occupational Therapy Treatment Patient Details Name: Tami Hopkins MRN: [id number] DOB: 1961/07/01 Today's Date: 12/19/2023   History of present illness This is a 63 year old woman with a history of hyperlipidemia, COPD, asthma, tobacco use, chronic pain syndrome, anxiety, PTSD, IBS, and GERD who presented to the ED for unresponsiveness after being found down at home, possibly for up to 2 days, requiring intubation and admission to the ICU. Etiology favored to be narcotic overdose or polypharmacy.   OT comments  Chart reviewed, pt greeted in bed, agreeable to OT tx session. Pt requested sling for improved RUE support to use as an assist- discussed with patient on support of R elbow on table/with pillows for improved distal mobility. Pt performs package/container management with supervision after compensations are made, agreeable to trial this for improved RUE function instead of sling. Discussed RUE HEP, hemi dressing techniques. Pt amb in hallway with RW with CGA approx 100'. Pt is making progress towards goals, OT will continue to follow.       If plan is discharge home, recommend the following:  A little help with bathing/dressing/bathroom;A little help with walking and/or transfers   Equipment Recommendations  BSC/3in1    Recommendations for Other Services      Precautions / Restrictions Precautions Precautions: Fall Recall of Precautions/Restrictions: Impaired Restrictions Weight Bearing Restrictions Per Provider Order: No       Mobility Bed Mobility Overal bed mobility: Needs Assistance Bed Mobility: Supine to Sit, Sit to Supine     Supine to sit: Supervision Sit to supine: Supervision        Transfers Overall transfer level: Needs assistance Equipment used: Rolling walker (2 wheels) Transfers: Sit to/from Stand Sit to Stand: Contact guard assist                 Balance Overall balance assessment: Needs assistance Sitting-balance support: Feet  supported Sitting balance-Leahy Scale: Good     Standing balance support: Bilateral upper extremity supported, During functional activity, Reliant on assistive device for balance Standing balance-Leahy Scale: Fair                             ADL either performed or assessed with clinical judgement   ADL Overall ADL's : Needs assistance/impaired Eating/Feeding: Sitting;Set up;Supervision/ safety Eating/Feeding Details (indicate cue type and reason): able to use R hand as an active asssit when elbow supported with 2-3 pillows sitting on edge of bed Grooming: Sitting;Oral care;Set up;Supervision/safety           Upper Body Dressing : Moderate assistance Upper Body Dressing Details (indicate cue type and reason): education for hemi dressing technique     Toilet Transfer: Rolling walker (2 wheels);Regular Toilet;Ambulation;Contact guard assist           Functional mobility during ADLs: Supervision/safety;Contact guard assist;Rolling walker (2 wheels) (approx 100' with intermittent vcs for technique)      Extremity/Trunk Assessment Upper Extremity Assessment Upper Extremity Assessment: Right hand dominant;RUE deficits/detail RUE Deficits / Details: continued RUE deficits- able to shrug shoulders, unable to perform shoulder flexion AROM, elbow flexion 1/4 AROM; wrist 3/4 full AROM, able to open/close hand and use as a functional assist when supported; PROM grossly WFL RUE Coordination: decreased fine motor   Lower Extremity Assessment Lower Extremity Assessment: Defer to PT evaluation;Generalized weakness        Vision Baseline Vision/History: 1 Wears glasses Patient Visual Report: No change from baseline     Perception  Praxis     Communication Communication Communication: No apparent difficulties   Cognition Arousal: Alert Behavior During Therapy: WFL for tasks assessed/performed Cognition: No family/caregiver present to determine baseline    Orientation impairments: Time   Memory impairment (select all impairments): Declarative long-term memory Attention impairment (select first level of impairment): Alternating attention Executive functioning impairment (select all impairments): Problem solving OT - Cognition Comments: improved participation as compared to previous sessions, pleasant and agreeable                 Following commands: Intact Following commands impaired: Only follows one step commands consistently      Cueing   Cueing Techniques: Verbal cues, Gestural cues, Tactile cues, Visual cues  Exercises Other Exercises Other Exercises: edu re: RUE positioning, HEP, R elbow positioning for proximal stability to faciltiate distal mobility (improved R hand as an active assist)    Shoulder Instructions       General Comments      Pertinent Vitals/ Pain       Pain Assessment Pain Assessment: 0-10 Pain Score: 8  Pain Location: R foot (feels like gravel) Pain Descriptors / Indicators: Guarding, Grimacing Pain Intervention(s): Monitored during session, Repositioned  Home Living                                          Prior Functioning/Environment              Frequency  Min 1X/week        Progress Toward Goals  OT Goals(current goals can now be found in the care plan section)  Progress towards OT goals: Progressing toward goals  Acute Rehab OT Goals Patient Stated Goal: improve function OT Goal Formulation: With patient Time For Goal Achievement: 01/02/24 Potential to Achieve Goals: Good ADL Goals Pt/caregiver will Perform Home Exercise Program: Increased ROM;Increased strength;Right Upper extremity;With written HEP provided  Plan      Co-evaluation                 AM-PAC OT "6 Clicks" Daily Activity     Outcome Measure   Help from another person eating meals?: None Help from another person taking care of personal grooming?: None Help from another person  toileting, which includes using toliet, bedpan, or urinal?: A Little Help from another person bathing (including washing, rinsing, drying)?: A Little Help from another person to put on and taking off regular upper body clothing?: A Little Help from another person to put on and taking off regular lower body clothing?: A Little 6 Click Score: 20    End of Session Equipment Utilized During Treatment: Rolling walker (2 wheels)  OT Visit Diagnosis: Other abnormalities of gait and mobility (R26.89);Muscle weakness (generalized) (M62.81)   Activity Tolerance Patient tolerated treatment well   Patient Left with call bell/phone within reach;in bed;with bed alarm set;Other (comment) (TOC in room)   Nurse Communication Mobility status        Time: 1129-1150 OT Time Calculation (min): 21 min  Charges: OT General Charges $OT Visit: 1 Visit OT Evaluation $OT Re-eval: 1 Re-eval OT Treatments $Therapeutic Activity: 8-22 mins  Oleta Mouse, OTD OTR/L  12/19/23, 1:26 PM

## 2023-12-19 NOTE — Progress Notes (Signed)
 Mobility Specialist - Progress Note   12/19/23 1000  Mobility  Activity Ambulated with assistance in hallway  Level of Assistance Standby assist, set-up cues, supervision of patient - no hands on  Assistive Device Front wheel walker  Distance Ambulated (ft) 200 ft  Activity Response Tolerated well  Mobility visit 1 Mobility     Pt lying in bed upon arrival, utilizing RA. Pt agreeable to activity. Pt completed bed mobility modI. STS and ambulation with minG. Pt does report feeling wobbly, but no buckling or LOB noted. Pt also voices that she feels as if she's "walking on gravel" in R foot. Upon return to room, pt does become teary-eyed and emotional. States that she's starting to remember things and it makes her upset and keeps her up at night. Also voices concerns about d/c plan. Emotional support and active listening provided. Pt returned to bed with alarm set, needs in reach.    Filiberto Pinks Mobility Specialist 12/19/23, 10:57 AM

## 2023-12-19 NOTE — Progress Notes (Signed)
 PROGRESS NOTE    TYYNE CLIETT  ZHY:[id number] DOB: July 15, 1961 DOA: 12/04/2023 PCP: Ardyth Man, PA-C    Brief Narrative:   Tami Hopkins is a 63 y.o. female with a PMH significant for HLD, COPD, Asthma, Tobacco use, Chronic pain syndrome, Anxiety, PTSD, IBS, and GERD who presents to the ED for altered mental status.  Per chart review, family had not heard from the patient for the past 2 days and called for a wellness check.  Patient subsequently found unresponsive sitting on the couch, roommate stated that they thought patient had been sleeping on the couch for the past few days.  Of note, patient was admitted to the hospital a couple of months ago for altered mental status due to suspected polypharmacy use.  Upon EMS arrival patient was given 2 mg of intranasal Narcan with no effect, subsequently given 2 mg IV Narcan with slight respiratory improvement, yet remained unresponsive.  Upon arrival to the ER, BP 94/78, afebrile, 100% on room air, she was noted to have vomit in her airway during suctioning. She was intubated for airway protection. Labs revealed K 6.0, Creat 3.72, BUN 62, AST 162, ALT 85, CK 4155, Troponin 91 and 74, lactic acid 1.6, WBC 19.0, Drug screen + benzodiazepine, opiate, tricyclics. She was given a total of 3 liters of IVF, started on broad spectrum antibiotics, cultures sent, and started on Norepinephrine infusion. CT c-spine without noted fracture, CT head without acute pathology, CT A/P/T bilateral atelectasis, stable non obstructing right renal calculi, hepatic steatosis, aortic atherosclerosis, no acute intra abdominal process.     PCCM was consulted for Medical Management in this critically ill patient in septic shock and respiratory failure suspected due to polysubstance abuse and aspiration Pneumonia   2/12: Admitted with AMS due to polysubstance abuse. Septic shock, levophed, cultures sent, intubated  02/13: Brainstem reflexes improved today with regain of  cough, gag, pupils reactive.  02/14: Improved mental status but not yet following commands.  02/16: Extubated to nasal cannula.  With right sided weakness in upper and lower extremity and facial twitches. Febrile to 101. Neurology is consulted.  2/18: [facility] assumed care 2/20: Mental status waxes and wanes.  Delirium noted last night 2/25: Mental status improved.  Motivated and working with therapy   Assessment & Plan:   Principal Problem:   Acute hypoxic respiratory failure (HCC) Active Problems:   Rhabdomyolysis   Aspiration pneumonia (HCC)   AKI (acute kidney injury) (HCC)   Chronic obstructive pulmonary disease (HCC)   Chronic pain syndrome   Post-traumatic stress disorder   Gastroesophageal reflux disease with esophagitis   High cholesterol   Moderate persistent asthma without complication   Tobacco use disorder   Acute respiratory failure (HCC)  # Unintentional opioid overdose # Opioid use disorder # Chronic pain Denies intentional overdose, denies SI Plan: - will need close outpatient f/u with pain medicine and plan to wean off opioids vs suboxone/subutex or other OUD treatment - continue home prn oxy for now - cont home gabapentin, baclofen (baclofen at reduced dose)  # Acute mebabolic encephalopathy Noted to be more confused on 2/20, wandering, removing IV access Neuro workup reassuring Mental status improving Appears to be at baseline Plan: Continue home psych regimen Educate patient on importance of adherence to prescribed regimen Delirium precautions, frequent reorienting measures Mentation has been improving   # GAD - cont klonopin - cont home buspar   # Hypokalemia - replete and monitor as needed   # AKI Prerenal  2/2 overdose, resolved   # Loose stool Resolved   # Aspiration pneumonia # Acute hypoxic respiratory failure Extubated and now breathing comfortably on room air, most recent cxr no infiltrates. Finished a course of IV abx  in the ICU. MRI nothing acute, EEG no seizure -Patient on room air.  Continue to monitor   # Debility PT advising SNF, TOC is aware and pursuing a bed No bed offers yet.  Patient was educated extensively on the importance of regular working with physical therapy.  She agreed and worked with therapy on 2/21.  I recommended daily interactions with therapy, effective recovery mobilization.  Patient has been willingness to work with therapy services  # Facial twitching # Right upper extremity weakness # Myositis/rhabdomyolysis Patient with severe and persistent right upper extremity weakness.  MRI brain and cervical spine overall unrevealing.  Patient does have diffuse soft tissue edema on right shoulder concerning for myositis.  CK has normalized. Plan: Continue to suspect myositis secondary to rhabdomyolysis.  Upper extremity strength is improving on a daily basis.  Patient is motivated to improve and recover.  She has been actively working with therapy and mobility services on a daily basis.  She will benefit from skilled nursing facility placement.     DVT prophylaxis: SQ lovenox Code Status: FULL Family Communication:Daughter via phone 2/19.  Offered to call 2/25 however patient declined Disposition Plan: Status is: Inpatient Remains inpatient appropriate because: Weakness decreased from baseline.  Resolving encephalopathy.  Will need SNF.  She is medically ready for discharge at this time   Level of care: Med-Surg  Consultants:  PCCM Neurology  Procedures:  S/p extubation on 2/16  Antimicrobials: None    Subjective: No significant overnight events, patient was lying in the bed.  Patient was talking on the phone with someone regarding placement and arrangement after SNF facility placement. Patient was anxious and stated that she may not have a place to go after sending facility so she is going to figure it out.   Objective: Vitals:   12/18/23 2243 12/19/23 0500 12/19/23  0842 12/19/23 1618  BP: 112/71  106/79 (!) 113/96  Pulse: 91  (!) 104 (!) 102  Resp: 17  16 16   Temp: 98.1 F (36.7 C)  98.4 F (36.9 C) 98.3 F (36.8 C)  TempSrc: Oral  Oral Oral  SpO2: 94%  92% 95%  Weight:  80.2 kg    Height:        Intake/Output Summary (Last 24 hours) at 12/19/2023 1637 Last data filed at 12/19/2023 1050 Gross per 24 hour  Intake 480 ml  Output --  Net 480 ml    Filed Weights   12/17/23 0500 12/18/23 0500 12/19/23 0500  Weight: 81.1 kg 81.2 kg 80.2 kg    Examination:  General exam: No acute distress.  Fatigued but in good spirits Respiratory system: Lungs clear, normal WOB, RA Cardiovascular system: S1S2, RRR, no murmur Gastrointestinal system: Soft, NT/ND, normal bowel sounds Central nervous system: Alert and oriented x 3.  No focal deficits Extremities: Right upper extremity weak, improved from prior.  Gait intact Skin: No rashes, lesions or ulcers Psychiatry: Judgement and insight appear impaired. Mood & affect flattened/confused.     Data Reviewed: I have personally reviewed following labs and imaging studies  CBC: Recent Labs  Lab 12/19/23 0533  WBC 6.9  HGB 11.8*  HCT 35.4*  MCV 92.4  PLT 370    Basic Metabolic Panel: Recent Labs  Lab 12/13/23 0642 12/19/23 0533  NA 138 135  K 3.6 3.3*  CL 108 104  CO2 23 22  GLUCOSE 112* 132*  BUN 11 15  CREATININE 0.58 0.59  CALCIUM 8.7* 9.1  MG 2.2 2.0  PHOS 3.7 3.8   GFR: Estimated Creatinine Clearance: 79 mL/min (by C-G formula based on SCr of 0.59 mg/dL). Liver Function Tests: No results for input(s): "AST", "ALT", "ALKPHOS", "BILITOT", "PROT", "ALBUMIN" in the last 168 hours.  No results for input(s): "LIPASE", "AMYLASE" in the last 168 hours. No results for input(s): "AMMONIA" in the last 168 hours.  Coagulation Profile: No results for input(s): "INR", "PROTIME" in the last 168 hours.  Cardiac Enzymes: No results for input(s): "CKTOTAL", "CKMB", "CKMBINDEX", "TROPONINI"  in the last 168 hours.  BNP (last 3 results) No results for input(s): "PROBNP" in the last 8760 hours. HbA1C: No results for input(s): "HGBA1C" in the last 72 hours. CBG: No results for input(s): "GLUCAP" in the last 168 hours.  Lipid Profile: No results for input(s): "CHOL", "HDL", "LDLCALC", "TRIG", "CHOLHDL", "LDLDIRECT" in the last 72 hours. Thyroid Function Tests: No results for input(s): "TSH", "T4TOTAL", "FREET4", "T3FREE", "THYROIDAB" in the last 72 hours. Anemia Panel: No results for input(s): "VITAMINB12", "FOLATE", "FERRITIN", "TIBC", "IRON", "RETICCTPCT" in the last 72 hours.  Sepsis Labs: No results for input(s): "PROCALCITON", "LATICACIDVEN" in the last 168 hours.   Recent Results (from the past 240 hours)  Gastrointestinal Panel by PCR , Stool     Status: None   Collection Time: 12/11/23  6:52 PM   Specimen: Stool  Result Value Ref Range Status   Campylobacter species NOT DETECTED NOT DETECTED Final   Plesimonas shigelloides NOT DETECTED NOT DETECTED Final   Salmonella species NOT DETECTED NOT DETECTED Final   Yersinia enterocolitica NOT DETECTED NOT DETECTED Final   Vibrio species NOT DETECTED NOT DETECTED Final   Vibrio cholerae NOT DETECTED NOT DETECTED Final   Enteroaggregative E coli (EAEC) NOT DETECTED NOT DETECTED Final   Enteropathogenic E coli (EPEC) NOT DETECTED NOT DETECTED Final   Enterotoxigenic E coli (ETEC) NOT DETECTED NOT DETECTED Final   Shiga like toxin producing E coli (STEC) NOT DETECTED NOT DETECTED Final   Shigella/Enteroinvasive E coli (EIEC) NOT DETECTED NOT DETECTED Final   Cryptosporidium NOT DETECTED NOT DETECTED Final   Cyclospora cayetanensis NOT DETECTED NOT DETECTED Final   Entamoeba histolytica NOT DETECTED NOT DETECTED Final   Giardia lamblia NOT DETECTED NOT DETECTED Final   Adenovirus F40/41 NOT DETECTED NOT DETECTED Final   Astrovirus NOT DETECTED NOT DETECTED Final   Norovirus GI/GII NOT DETECTED NOT DETECTED Final    Rotavirus A NOT DETECTED NOT DETECTED Final   Sapovirus (I, II, IV, and V) NOT DETECTED NOT DETECTED Final    Comment: Performed at [facility], [address] [place], [place] [address]     Radiology Studies: No results found.   Scheduled Meds:  baclofen  5 mg Oral BID   busPIRone  10 mg Oral TID   enoxaparin (LOVENOX) injection  40 mg Subcutaneous Q24H   feeding supplement  237 mL Oral TID BM   folic acid  1 mg Oral Daily   gabapentin  300 mg Oral QID   Gerhardt's butt cream   Topical BID   hydrOXYzine  25 mg Oral Once   montelukast  10 mg Oral QHS   nystatin   Topical TID   pantoprazole  40 mg Oral Daily   prazosin  1 mg Oral QHS   QUEtiapine  100  mg Oral Daily   QUEtiapine  200 mg Oral QHS   thiamine  100 mg Oral Daily   Or   thiamine (VITAMIN B1) injection  100 mg Intravenous Daily   Continuous Infusions:   LOS: 15 days    Gillis Santa, MD [facility]   If 7PM-7AM, please contact night-coverage  12/19/2023, 4:37 PM

## 2023-12-20 DIAGNOSIS — J9601 Acute respiratory failure with hypoxia: Secondary | ICD-10-CM | POA: Diagnosis not present

## 2023-12-20 MED ORDER — IBUPROFEN 400 MG PO TABS
400.0000 mg | ORAL_TABLET | Freq: Three times a day (TID) | ORAL | Status: AC
  Administered 2023-12-20 (×2): 400 mg via ORAL
  Filled 2023-12-20 (×2): qty 1

## 2023-12-20 NOTE — Plan of Care (Signed)

## 2023-12-20 NOTE — TOC Progression Note (Signed)
 Transition of Care [facility]) - Progression Note    Patient Details  Name: Tami Hopkins MRN: [id number] Date of Birth: 02-Jan-1961  [facility] CM/SW Contact  Marlowe Sax, RN Phone Number: 12/20/2023, 10:02 AM  Clinical Narrative:     Had a lengthy conversation with the patient, I provided her with the [facility] shelter phone number, she stated that she had been there in the past and they set her up with transitional housing She reports that she has a service dog that will have to go with her regardless of where she goes, I explained to set her up with STR she would not be allowed to take a dog and that she would have to have a DC plan in place before they would accept her, she stated that she needs housing and could not afford it on her own, I explained I could not set up housing for her and offered her low income housing resources, she stated that she has been given that so many times and that it is not what she needs, I explained that I can not set up housing for her, I told her that she needs to work with her family about getting together a DC plan if she does not want the resources that I can provide, she stated that she can not return to her previous environment due to being not safe and he did not want her to return, I explained that she has 2 daughters and together they will need to formulate a plan, she stated that she has no options, I told her that [name] was supposed to be making some calls on her behalf       Expected Discharge Plan and Services                                               Social Determinants of Health (SDOH) Interventions SDOH Screenings   Food Insecurity: Patient Unable To Answer (12/05/2023)  Recent Concern: Food Insecurity - Food Insecurity Present (10/10/2023)  Housing: Patient Unable To Answer (12/05/2023)  Recent Concern: Housing - High Risk (10/09/2023)  Transportation Needs: Patient Unable To Answer  (12/05/2023)  Utilities: Patient Unable To Answer (12/05/2023)  Depression (PHQ2-9): Low Risk  (11/21/2023)  Financial Resource Strain: Medium Risk (09/25/2022)   Received from [facility], [facility]  Physical Activity: Insufficiently Active (01/13/2021)   Received from [facility], [facility]  Social Connections: Socially Isolated (01/13/2021)   Received from [facility], [facility]  Stress: Stress Concern Present (01/13/2021)   Received from [facility], [facility]  Tobacco Use: High Risk (12/17/2023)    Readmission Risk Interventions     No data to display

## 2023-12-20 NOTE — Progress Notes (Signed)
 Physical Therapy Treatment Patient Details Name: Tami Hopkins MRN: [id number] DOB: [birth year] Today's Date: 12/20/2023   History of Present Illness This is a 63 year old woman with a history of hyperlipidemia, COPD, asthma, tobacco use, chronic pain syndrome, anxiety, PTSD, IBS, and GERD who presented to the ED for unresponsiveness after being found down at home, possibly for up to 2 days, requiring intubation and admission to the ICU. Etiology favored to be narcotic overdose or polypharmacy.    PT Comments  Patient A&Ox4, reported significant pain in R foot, denying any mobility initially. Pt educated on potential plantar fascia symptoms she may be experiencing, pt agreeable transition to EOB (supervision) to roll foot out to assess response. Pt denied changes in standing (no decrease in pain compared to prior to exercise) returned to supine due to pain. Pt with needs in reach at end of session. The patient would benefit from further skilled PT intervention to continue to progress towards goals.    If plan is discharge home, recommend the following: A little help with walking and/or transfers;A little help with bathing/dressing/bathroom;Assistance with cooking/housework;Assist for transportation;Help with stairs or ramp for entrance;Direct supervision/assist for financial management   Can travel by private vehicle     No  Equipment Recommendations  Rolling walker (2 wheels);BSC/3in1    Recommendations for Other Services       Precautions / Restrictions Precautions Precautions: Fall Recall of Precautions/Restrictions: Impaired Restrictions Weight Bearing Restrictions Per Provider Order: No     Mobility  Bed Mobility Overal bed mobility: Needs Assistance Bed Mobility: Supine to Sit, Sit to Supine     Supine to sit: Supervision Sit to supine: Supervision        Transfers Overall transfer level: Needs assistance Equipment used: 1 person hand held assist                General transfer comment: CGA on R side at pt request    Ambulation/Gait               General Gait Details: pt declined ambulation   Stairs             Wheelchair Mobility     Tilt Bed    Modified Rankin (Stroke Patients Only)       Balance Overall balance assessment: Needs assistance Sitting-balance support: Feet supported Sitting balance-Leahy Scale: Good     Standing balance support: Bilateral upper extremity supported, During functional activity, Reliant on assistive device for balance Standing balance-Leahy Scale: Fair                              Hotel manager: No apparent difficulties  Cognition Arousal: Alert Behavior During Therapy: WFL for tasks assessed/performed   PT - Cognitive impairments: No apparent impairments                           Following commands impaired: Only follows one step commands consistently    Cueing Cueing Techniques: Verbal cues, Gestural cues, Tactile cues, Visual cues  Exercises Other Exercises Other Exercises: pt educated on plantar fascia stretch with toe (pt reported she will not do because it hurts) and attmpted rolling out foot arch for 2 minutes seated EOB. pt declined change in pain during standing.    General Comments        Pertinent Vitals/Pain Pain Assessment Pain Assessment: Faces Faces Pain Scale: Hurts whole lot Pain Location:  R foot (feels like gravel) Pain Descriptors / Indicators: Guarding, Grimacing, Shooting, Sharp Pain Intervention(s): Limited activity within patient's tolerance, Monitored during session    Home Living                          Prior Function            PT Goals (current goals can now be found in the care plan section) Progress towards PT goals: Progressing toward goals    Frequency    Min 1X/week      PT Plan      Co-evaluation              AM-PAC PT "6 Clicks" Mobility    Outcome Measure  Help needed turning from your back to your side while in a flat bed without using bedrails?: A Little Help needed moving from lying on your back to sitting on the side of a flat bed without using bedrails?: A Little Help needed moving to and from a bed to a chair (including a wheelchair)?: A Little Help needed standing up from a chair using your arms (e.g., wheelchair or bedside chair)?: A Little Help needed to walk in hospital room?: A Little Help needed climbing 3-5 steps with a railing? : A Little 6 Click Score: 18    End of Session Equipment Utilized During Treatment: Gait belt Activity Tolerance: Patient limited by pain Patient left: in bed;with call bell/phone within reach;with bed alarm set Nurse Communication: Mobility status PT Visit Diagnosis: Other abnormalities of gait and mobility (R26.89);Difficulty in walking, not elsewhere classified (R26.2);Muscle weakness (generalized) (M62.81)     Time: 8295-6213 PT Time Calculation (min) (ACUTE ONLY): 17 min  Charges:    $Therapeutic Exercise: 8-22 mins PT General Charges $$ ACUTE PT VISIT: 1 Visit                     Olga Coaster PT, DPT 2:42 PM,12/20/23

## 2023-12-20 NOTE — Progress Notes (Signed)
 PROGRESS NOTE    Tami Hopkins  WJX:[id number] DOB: 1961-09-19 DOA: 12/04/2023 PCP: Ardyth Man, PA-C    Brief Narrative:   Tami Hopkins is a 63 y.o. female with a PMH significant for HLD, COPD, Asthma, Tobacco use, Chronic pain syndrome, Anxiety, PTSD, IBS, and GERD who presents to the ED for altered mental status.  Per chart review, family had not heard from the patient for the past 2 days and called for a wellness check.  Patient subsequently found unresponsive sitting on the couch, roommate stated that they thought patient had been sleeping on the couch for the past few days.  Of note, patient was admitted to the hospital a couple of months ago for altered mental status due to suspected polypharmacy use.  Upon EMS arrival patient was given 2 mg of intranasal Narcan with no effect, subsequently given 2 mg IV Narcan with slight respiratory improvement, yet remained unresponsive.  Upon arrival to the ER, BP 94/78, afebrile, 100% on room air, she was noted to have vomit in her airway during suctioning. She was intubated for airway protection. Labs revealed K 6.0, Creat 3.72, BUN 62, AST 162, ALT 85, CK 4155, Troponin 91 and 74, lactic acid 1.6, WBC 19.0, Drug screen + benzodiazepine, opiate, tricyclics. She was given a total of 3 liters of IVF, started on broad spectrum antibiotics, cultures sent, and started on Norepinephrine infusion. CT c-spine without noted fracture, CT head without acute pathology, CT A/P/T bilateral atelectasis, stable non obstructing right renal calculi, hepatic steatosis, aortic atherosclerosis, no acute intra abdominal process.     PCCM was consulted for Medical Management in this critically ill patient in septic shock and respiratory failure suspected due to polysubstance abuse and aspiration Pneumonia   2/12: Admitted with AMS due to polysubstance abuse. Septic shock, levophed, cultures sent, intubated  02/13: Brainstem reflexes improved today with regain of  cough, gag, pupils reactive.  02/14: Improved mental status but not yet following commands.  02/16: Extubated to nasal cannula.  With right sided weakness in upper and lower extremity and facial twitches. Febrile to 101. Neurology is consulted.  2/18: [facility] hospitalists assumed care 2/20: Mental status waxes and wanes.  Delirium noted last night 2/25: Mental status improved.  Motivated and working with therapy   Assessment & Plan:   Principal Problem:   Acute hypoxic respiratory failure (HCC) Active Problems:   Rhabdomyolysis   Aspiration pneumonia (HCC)   AKI (acute kidney injury) (HCC)   Chronic obstructive pulmonary disease (HCC)   Chronic pain syndrome   Post-traumatic stress disorder   Gastroesophageal reflux disease with esophagitis   High cholesterol   Moderate persistent asthma without complication   Tobacco use disorder   Acute respiratory failure (HCC)  # Unintentional opioid overdose # Opioid use disorder # Chronic pain Denies intentional overdose, denies SI Plan: - will need close outpatient f/u with pain medicine and plan to wean off opioids vs suboxone/subutex or other OUD treatment - continue home prn oxy for now - cont home gabapentin, baclofen (baclofen at reduced dose) 2/28 c/o pain in the R foot sole and top of toes, sensitive to touch.  Possible neuropathic pain.  Patient is only on gabapentin.  Ibuprofen 400 mg 3 times daily x 2 doses order placed Patient does not want Voltaren gel  # Acute mebabolic encephalopathy Noted to be more confused on 2/20, wandering, removing IV access Neuro workup reassuring Mental status improving Appears to be at baseline Plan: Continue home psych regimen Educate  patient on importance of adherence to prescribed regimen Delirium precautions, frequent reorienting measures Mentation has been improving   # GAD - cont klonopin - cont home buspar   # Hypokalemia - replete and monitor as needed   # AKI Prerenal 2/2  overdose, resolved   # Loose stool Resolved   # Aspiration pneumonia # Acute hypoxic respiratory failure Extubated and now breathing comfortably on room air, most recent cxr no infiltrates. Finished a course of IV abx in the ICU. MRI nothing acute, EEG no seizure -Patient on room air.  Continue to monitor   # Debility PT advising SNF, TOC is aware and pursuing a bed No bed offers yet.  Patient was educated extensively on the importance of regular working with physical therapy.  She agreed and worked with therapy on 2/21.  I recommended daily interactions with therapy, effective recovery mobilization.  Patient has been willingness to work with therapy services  # Facial twitching # Right upper extremity weakness # Myositis/rhabdomyolysis Patient with severe and persistent right upper extremity weakness.  MRI brain and cervical spine overall unrevealing.  Patient does have diffuse soft tissue edema on right shoulder concerning for myositis.  CK has normalized. Plan: Continue to suspect myositis secondary to rhabdomyolysis.  Upper extremity strength is improving on a daily basis.  Patient is motivated to improve and recover.  She has been actively working with therapy and mobility services on a daily basis.  She will benefit from skilled nursing facility placement.     DVT prophylaxis: SQ lovenox Code Status: FULL Family Communication:Daughter via phone 2/19.  Offered to call 2/25 however patient declined Disposition Plan: Status is: Inpatient Remains inpatient appropriate because: Weakness decreased from baseline.  Resolving encephalopathy.  Will need SNF.  She is medically ready for discharge at this time   Level of care: Med-Surg  Consultants:  PCCM Neurology  Procedures:  S/p extubation on 2/16  Antimicrobials: None    Subjective: No significant overnight events, c/o pain in the right foot especially in the sole and top of toes.  Patient does not want Voltaren gel, asking  for another cream. Patient is only taking gabapentin. Right arm pain is getting better, denied any other complaints.  Objective: Vitals:   12/19/23 0842 12/19/23 1618 12/19/23 2224 12/20/23 0800  BP: 106/79 (!) 113/96 116/83 (!) 94/52  Pulse: (!) 104 (!) 102 90 96  Resp: 16 16 18 16   Temp: 98.4 F (36.9 C) 98.3 F (36.8 C) 98 F (36.7 C) 98.3 F (36.8 C)  TempSrc: Oral Oral Oral Oral  SpO2: 92% 95% 95% 96%  Weight:      Height:       No intake or output data in the 24 hours ending 12/20/23 1421   Filed Weights   12/17/23 0500 12/18/23 0500 12/19/23 0500  Weight: 81.1 kg 81.2 kg 80.2 kg    Examination:  General exam: No acute distress.  Fatigued but in good spirits Respiratory system: Lungs clear, normal WOB, RA Cardiovascular system: S1S2, RRR, no murmur Gastrointestinal system: Soft, NT/ND, normal bowel sounds Central nervous system: Alert and oriented x 3.  No focal deficits Extremities: Right upper extremity weak, improved from prior.  Gait intact Skin: No rashes, lesions or ulcers Psychiatry: Judgement and insight appear impaired. Mood & affect flattened/confused.     Data Reviewed: I have personally reviewed following labs and imaging studies  CBC: Recent Labs  Lab 12/19/23 0533  WBC 6.9  HGB 11.8*  HCT 35.4*  MCV  92.4  PLT 370    Basic Metabolic Panel: Recent Labs  Lab 12/19/23 0533  NA 135  K 3.3*  CL 104  CO2 22  GLUCOSE 132*  BUN 15  CREATININE 0.59  CALCIUM 9.1  MG 2.0  PHOS 3.8   GFR: Estimated Creatinine Clearance: 79 mL/min (by C-G formula based on SCr of 0.59 mg/dL). Liver Function Tests: No results for input(s): "AST", "ALT", "ALKPHOS", "BILITOT", "PROT", "ALBUMIN" in the last 168 hours.  No results for input(s): "LIPASE", "AMYLASE" in the last 168 hours. No results for input(s): "AMMONIA" in the last 168 hours.  Coagulation Profile: No results for input(s): "INR", "PROTIME" in the last 168 hours.  Cardiac Enzymes: No  results for input(s): "CKTOTAL", "CKMB", "CKMBINDEX", "TROPONINI" in the last 168 hours.  BNP (last 3 results) No results for input(s): "PROBNP" in the last 8760 hours. HbA1C: No results for input(s): "HGBA1C" in the last 72 hours. CBG: No results for input(s): "GLUCAP" in the last 168 hours.  Lipid Profile: No results for input(s): "CHOL", "HDL", "LDLCALC", "TRIG", "CHOLHDL", "LDLDIRECT" in the last 72 hours. Thyroid Function Tests: No results for input(s): "TSH", "T4TOTAL", "FREET4", "T3FREE", "THYROIDAB" in the last 72 hours. Anemia Panel: No results for input(s): "VITAMINB12", "FOLATE", "FERRITIN", "TIBC", "IRON", "RETICCTPCT" in the last 72 hours.  Sepsis Labs: No results for input(s): "PROCALCITON", "LATICACIDVEN" in the last 168 hours.   Recent Results (from the past 240 hours)  Gastrointestinal Panel by PCR , Stool     Status: None   Collection Time: 12/11/23  6:52 PM   Specimen: Stool  Result Value Ref Range Status   Campylobacter species NOT DETECTED NOT DETECTED Final   Plesimonas shigelloides NOT DETECTED NOT DETECTED Final   Salmonella species NOT DETECTED NOT DETECTED Final   Yersinia enterocolitica NOT DETECTED NOT DETECTED Final   Vibrio species NOT DETECTED NOT DETECTED Final   Vibrio cholerae NOT DETECTED NOT DETECTED Final   Enteroaggregative E coli (EAEC) NOT DETECTED NOT DETECTED Final   Enteropathogenic E coli (EPEC) NOT DETECTED NOT DETECTED Final   Enterotoxigenic E coli (ETEC) NOT DETECTED NOT DETECTED Final   Shiga like toxin producing E coli (STEC) NOT DETECTED NOT DETECTED Final   Shigella/Enteroinvasive E coli (EIEC) NOT DETECTED NOT DETECTED Final   Cryptosporidium NOT DETECTED NOT DETECTED Final   Cyclospora cayetanensis NOT DETECTED NOT DETECTED Final   Entamoeba histolytica NOT DETECTED NOT DETECTED Final   Giardia lamblia NOT DETECTED NOT DETECTED Final   Adenovirus F40/41 NOT DETECTED NOT DETECTED Final   Astrovirus NOT DETECTED NOT DETECTED  Final   Norovirus GI/GII NOT DETECTED NOT DETECTED Final   Rotavirus A NOT DETECTED NOT DETECTED Final   Sapovirus (I, II, IV, and V) NOT DETECTED NOT DETECTED Final    Comment: Performed at [facility], [address], [place], [place] [address]     Radiology Studies: No results found.   Scheduled Meds:  baclofen  5 mg Oral BID   busPIRone  10 mg Oral TID   enoxaparin (LOVENOX) injection  40 mg Subcutaneous Q24H   feeding supplement  237 mL Oral TID BM   folic acid  1 mg Oral Daily   gabapentin  300 mg Oral QID   Gerhardt's butt cream   Topical BID   hydrOXYzine  25 mg Oral Once   ibuprofen  400 mg Oral TID   montelukast  10 mg Oral QHS   nystatin   Topical TID   pantoprazole  40 mg  Oral Daily   prazosin  1 mg Oral QHS   QUEtiapine  100 mg Oral Daily   QUEtiapine  200 mg Oral QHS   thiamine  100 mg Oral Daily   Or   thiamine (VITAMIN B1) injection  100 mg Intravenous Daily   Continuous Infusions:   LOS: 16 days    Gillis Santa, MD [facility]   If 7PM-7AM, please contact night-coverage  12/20/2023, 2:21 PM

## 2023-12-21 DIAGNOSIS — J9601 Acute respiratory failure with hypoxia: Secondary | ICD-10-CM | POA: Diagnosis not present

## 2023-12-21 MED ORDER — DICLOFENAC SODIUM 1 % EX GEL
2.0000 g | Freq: Three times a day (TID) | CUTANEOUS | Status: DC
  Administered 2023-12-21 – 2024-01-01 (×28): 2 g via TOPICAL
  Filled 2023-12-21 (×2): qty 100

## 2023-12-21 MED ORDER — GABAPENTIN 300 MG PO CAPS
300.0000 mg | ORAL_CAPSULE | Freq: Two times a day (BID) | ORAL | Status: DC | PRN
  Administered 2023-12-21 – 2024-01-01 (×19): 300 mg via ORAL
  Filled 2023-12-21 (×21): qty 1

## 2023-12-21 MED ORDER — IBUPROFEN 400 MG PO TABS
400.0000 mg | ORAL_TABLET | Freq: Four times a day (QID) | ORAL | Status: DC | PRN
  Administered 2023-12-22 – 2023-12-26 (×8): 400 mg via ORAL
  Filled 2023-12-21 (×11): qty 1

## 2023-12-21 NOTE — Progress Notes (Signed)
 PROGRESS NOTE    Tami Hopkins  VWU:[id number] DOB: December 27, 1960 DOA: 12/04/2023 PCP: Ardyth Man, PA-C    Brief Narrative:   Tami Hopkins is a 63 y.o. female with a PMH significant for HLD, COPD, Asthma, Tobacco use, Chronic pain syndrome, Anxiety, PTSD, IBS, and GERD who presents to the ED for altered mental status.  Per chart review, family had not heard from the patient for the past 2 days and called for a wellness check.  Patient subsequently found unresponsive sitting on the couch, roommate stated that they thought patient had been sleeping on the couch for the past few days.  Of note, patient was admitted to the hospital a couple of months ago for altered mental status due to suspected polypharmacy use.  Upon EMS arrival patient was given 2 mg of intranasal Narcan with no effect, subsequently given 2 mg IV Narcan with slight respiratory improvement, yet remained unresponsive.  Upon arrival to the ER, BP 94/78, afebrile, 100% on room air, she was noted to have vomit in her airway during suctioning. She was intubated for airway protection. Labs revealed K 6.0, Creat 3.72, BUN 62, AST 162, ALT 85, CK 4155, Troponin 91 and 74, lactic acid 1.6, WBC 19.0, Drug screen + benzodiazepine, opiate, tricyclics. She was given a total of 3 liters of IVF, started on broad spectrum antibiotics, cultures sent, and started on Norepinephrine infusion. CT c-spine without noted fracture, CT head without acute pathology, CT A/P/T bilateral atelectasis, stable non obstructing right renal calculi, hepatic steatosis, aortic atherosclerosis, no acute intra abdominal process.     PCCM was consulted for Medical Management in this critically ill patient in septic shock and respiratory failure suspected due to polysubstance abuse and aspiration Pneumonia   2/12: Admitted with AMS due to polysubstance abuse. Septic shock, levophed, cultures sent, intubated  02/13: Brainstem reflexes improved today with regain of  cough, gag, pupils reactive.  02/14: Improved mental status but not yet following commands.  02/16: Extubated to nasal cannula.  With right sided weakness in upper and lower extremity and facial twitches. Febrile to 101. Neurology is consulted.  2/18: [facility] hospitalists assumed care 2/20: Mental status waxes and wanes.  Delirium noted last night 2/25: Mental status improved.  Motivated and working with therapy   Assessment & Plan:   Principal Problem:   Acute hypoxic respiratory failure (HCC) Active Problems:   Rhabdomyolysis   Aspiration pneumonia (HCC)   AKI (acute kidney injury) (HCC)   Chronic obstructive pulmonary disease (HCC)   Chronic pain syndrome   Post-traumatic stress disorder   Gastroesophageal reflux disease with esophagitis   High cholesterol   Moderate persistent asthma without complication   Tobacco use disorder   Acute respiratory failure (HCC)  # Unintentional opioid overdose # Opioid use disorder # Chronic pain Denies intentional overdose, denies SI Plan: - will need close outpatient f/u with pain medicine and plan to wean off opioids vs suboxone/subutex or other OUD treatment - continue home prn oxy for now - cont home gabapentin, baclofen (baclofen at reduced dose) 2/28 c/o pain in the R foot sole and top of toes, sensitive to touch.  Possible neuropathic pain.  Patient is only on gabapentin.  Ibuprofen 400 mg 3 times daily x 2 doses order placed Patient does not want Voltaren gel 3/1 patient requested to add gabapentin 300 mg as needed twice daily dosing and Voltaren gel for right foot pain.  # Acute mebabolic encephalopathy Noted to be more confused on 2/20, wandering,  removing IV access Neuro workup reassuring Mental status improving Appears to be at baseline Plan: Continue home psych regimen Educate patient on importance of adherence to prescribed regimen Delirium precautions, frequent reorienting measures Mentation has been improving   #  GAD - cont klonopin - cont home buspar   # Hypokalemia - replete and monitor as needed   # AKI Prerenal 2/2 overdose, resolved   # Loose stool Resolved   # Aspiration pneumonia # Acute hypoxic respiratory failure Extubated and now breathing comfortably on room air, most recent cxr no infiltrates. Finished a course of IV abx in the ICU. MRI nothing acute, EEG no seizure -Patient on room air.  Continue to monitor   # Debility PT advising SNF, TOC is aware and pursuing a bed No bed offers yet.  Patient was educated extensively on the importance of regular working with physical therapy.  She agreed and worked with therapy on 2/21.  I recommended daily interactions with therapy, effective recovery mobilization.  Patient has been willingness to work with therapy services  # Facial twitching # Right upper extremity weakness # Myositis/rhabdomyolysis Patient with severe and persistent right upper extremity weakness.  MRI brain and cervical spine overall unrevealing.  Patient does have diffuse soft tissue edema on right shoulder concerning for myositis.  CK has normalized. Plan: Continue to suspect myositis secondary to rhabdomyolysis.  Upper extremity strength is improving on a daily basis.  Patient is motivated to improve and recover.  She has been actively working with therapy and mobility services on a daily basis.  She will benefit from skilled nursing facility placement.     DVT prophylaxis: SQ lovenox Code Status: FULL Family Communication:Daughter via phone 2/19.  Offered to call 2/25 however patient declined Disposition Plan: Status is: Inpatient Remains inpatient appropriate because: Weakness decreased from baseline.  Resolving encephalopathy.  Will need SNF.  She is medically ready for discharge at this time   Level of care: Med-Surg  Consultants:  PCCM Neurology  Procedures:  S/p extubation on 2/16  Antimicrobials: None    Subjective: No significant overnight  events, still having right foot pain 8-9/10, denied any chest pain or palpitation no shortness of breath. Patient was requesting gabapentin as needed dose and agreed for Voltaren gel.   Objective: Vitals:   12/20/23 1539 12/20/23 2135 12/20/23 2206 12/21/23 0752  BP: 102/78 119/84 97/68 115/73  Pulse: 91 86 94 100  Resp: 16 19 20 16   Temp: 99.1 F (37.3 C) 98.2 F (36.8 C) 98.8 F (37.1 C) 98.3 F (36.8 C)  TempSrc: Oral Oral Oral   SpO2: 95% 100% 97% 95%  Weight:      Height:       No intake or output data in the 24 hours ending 12/21/23 1606   Filed Weights   12/17/23 0500 12/18/23 0500 12/19/23 0500  Weight: 81.1 kg 81.2 kg 80.2 kg    Examination:  General exam: No acute distress.  Fatigued but in good spirits Respiratory system: Lungs clear, normal WOB, RA Cardiovascular system: S1S2, RRR, no murmur Gastrointestinal system: Soft, NT/ND, normal bowel sounds Central nervous system: Alert and oriented x 3.  No focal deficits Extremities: Right upper extremity weak, improved from prior.  Gait intact Skin: No rashes, lesions or ulcers Psychiatry: Judgement and insight appear impaired. Mood & affect flattened/confused.     Data Reviewed: I have personally reviewed following labs and imaging studies  CBC: Recent Labs  Lab 12/19/23 0533  WBC 6.9  HGB 11.8*  HCT 35.4*  MCV 92.4  PLT 370    Basic Metabolic Panel: Recent Labs  Lab 12/19/23 0533  Tami 135  K 3.3*  CL 104  CO2 22  GLUCOSE 132*  BUN 15  CREATININE 0.59  CALCIUM 9.1  MG 2.0  PHOS 3.8   GFR: Estimated Creatinine Clearance: 79 mL/min (by C-G formula based on SCr of 0.59 mg/dL). Liver Function Tests: No results for input(s): "AST", "ALT", "ALKPHOS", "BILITOT", "PROT", "ALBUMIN" in the last 168 hours.  No results for input(s): "LIPASE", "AMYLASE" in the last 168 hours. No results for input(s): "AMMONIA" in the last 168 hours.  Coagulation Profile: No results for input(s): "INR", "PROTIME"  in the last 168 hours.  Cardiac Enzymes: No results for input(s): "CKTOTAL", "CKMB", "CKMBINDEX", "TROPONINI" in the last 168 hours.  BNP (last 3 results) No results for input(s): "PROBNP" in the last 8760 hours. HbA1C: No results for input(s): "HGBA1C" in the last 72 hours. CBG: No results for input(s): "GLUCAP" in the last 168 hours.  Lipid Profile: No results for input(s): "CHOL", "HDL", "LDLCALC", "TRIG", "CHOLHDL", "LDLDIRECT" in the last 72 hours. Thyroid Function Tests: No results for input(s): "TSH", "T4TOTAL", "FREET4", "T3FREE", "THYROIDAB" in the last 72 hours. Anemia Panel: No results for input(s): "VITAMINB12", "FOLATE", "FERRITIN", "TIBC", "IRON", "RETICCTPCT" in the last 72 hours.  Sepsis Labs: No results for input(s): "PROCALCITON", "LATICACIDVEN" in the last 168 hours.   Recent Results (from the past 240 hours)  Gastrointestinal Panel by PCR , Stool     Status: None   Collection Time: 12/11/23  6:52 PM   Specimen: Stool  Result Value Ref Range Status   Campylobacter species NOT DETECTED NOT DETECTED Final   Plesimonas shigelloides NOT DETECTED NOT DETECTED Final   Salmonella species NOT DETECTED NOT DETECTED Final   Yersinia enterocolitica NOT DETECTED NOT DETECTED Final   Vibrio species NOT DETECTED NOT DETECTED Final   Vibrio cholerae NOT DETECTED NOT DETECTED Final   Enteroaggregative E coli (EAEC) NOT DETECTED NOT DETECTED Final   Enteropathogenic E coli (EPEC) NOT DETECTED NOT DETECTED Final   Enterotoxigenic E coli (ETEC) NOT DETECTED NOT DETECTED Final   Shiga like toxin producing E coli (STEC) NOT DETECTED NOT DETECTED Final   Shigella/Enteroinvasive E coli (EIEC) NOT DETECTED NOT DETECTED Final   Cryptosporidium NOT DETECTED NOT DETECTED Final   Cyclospora cayetanensis NOT DETECTED NOT DETECTED Final   Entamoeba histolytica NOT DETECTED NOT DETECTED Final   Giardia lamblia NOT DETECTED NOT DETECTED Final   Adenovirus F40/41 NOT DETECTED NOT  DETECTED Final   Astrovirus NOT DETECTED NOT DETECTED Final   Norovirus GI/GII NOT DETECTED NOT DETECTED Final   Rotavirus A NOT DETECTED NOT DETECTED Final   Sapovirus (I, II, IV, and V) NOT DETECTED NOT DETECTED Final    Comment: Performed at [facility], [address], [place], [place] [address]     Radiology Studies: No results found.   Scheduled Meds:  baclofen  5 mg Oral BID   busPIRone  10 mg Oral TID   diclofenac Sodium  2 g Topical TID   enoxaparin (LOVENOX) injection  40 mg Subcutaneous Q24H   feeding supplement  237 mL Oral TID BM   folic acid  1 mg Oral Daily   gabapentin  300 mg Oral QID   Gerhardt's butt cream   Topical BID   hydrOXYzine  25 mg Oral Once   montelukast  10 mg Oral QHS   nystatin   Topical TID  pantoprazole  40 mg Oral Daily   prazosin  1 mg Oral QHS   QUEtiapine  100 mg Oral Daily   QUEtiapine  200 mg Oral QHS   thiamine  100 mg Oral Daily   Or   thiamine (VITAMIN B1) injection  100 mg Intravenous Daily   Continuous Infusions:   LOS: 17 days    Gillis Santa, MD [facility]   If 7PM-7AM, please contact night-coverage  12/21/2023, 4:06 PM

## 2023-12-21 NOTE — Plan of Care (Signed)

## 2023-12-22 DIAGNOSIS — J9601 Acute respiratory failure with hypoxia: Secondary | ICD-10-CM | POA: Diagnosis not present

## 2023-12-22 MED ORDER — BISACODYL 5 MG PO TBEC
10.0000 mg | DELAYED_RELEASE_TABLET | Freq: Once | ORAL | Status: AC
  Administered 2023-12-27: 10 mg via ORAL
  Filled 2023-12-22: qty 2

## 2023-12-22 MED ORDER — POLYETHYLENE GLYCOL 3350 17 G PO PACK
17.0000 g | PACK | Freq: Two times a day (BID) | ORAL | Status: DC
  Administered 2023-12-23 – 2024-01-02 (×2): 17 g via ORAL
  Filled 2023-12-22 (×11): qty 1

## 2023-12-22 MED ORDER — BISACODYL 5 MG PO TBEC
10.0000 mg | DELAYED_RELEASE_TABLET | Freq: Every day | ORAL | Status: DC
Start: 2023-12-23 — End: 2024-01-02
  Administered 2023-12-26: 10 mg via ORAL
  Filled 2023-12-22 (×5): qty 2

## 2023-12-22 MED ORDER — BISACODYL 10 MG RE SUPP
10.0000 mg | Freq: Every day | RECTAL | Status: DC | PRN
Start: 2023-12-22 — End: 2024-01-02

## 2023-12-22 NOTE — Progress Notes (Signed)
 PROGRESS NOTE    Tami Hopkins  HQI:[id number] DOB: [birth year] DOA: 12/04/2023 PCP: Ardyth Man, PA-C    Brief Narrative:   Tami Hopkins is a 63 y.o. female with a PMH significant for HLD, COPD, Asthma, Tobacco use, Chronic pain syndrome, Anxiety, PTSD, IBS, and GERD who presents to the ED for altered mental status.  Per chart review, family had not heard from the patient for the past 2 days and called for a wellness check.  Patient subsequently found unresponsive sitting on the couch, roommate stated that they thought patient had been sleeping on the couch for the past few days.  Of note, patient was admitted to the hospital a couple of months ago for altered mental status due to suspected polypharmacy use.  Upon EMS arrival patient was given 2 mg of intranasal Narcan with no effect, subsequently given 2 mg IV Narcan with slight respiratory improvement, yet remained unresponsive.  Upon arrival to the ER, BP 94/78, afebrile, 100% on room air, she was noted to have vomit in her airway during suctioning. She was intubated for airway protection. Labs revealed K 6.0, Creat 3.72, BUN 62, AST 162, ALT 85, CK 4155, Troponin 91 and 74, lactic acid 1.6, WBC 19.0, Drug screen + benzodiazepine, opiate, tricyclics. She was given a total of 3 liters of IVF, started on broad spectrum antibiotics, cultures sent, and started on Norepinephrine infusion. CT c-spine without noted fracture, CT head without acute pathology, CT A/P/T bilateral atelectasis, stable non obstructing right renal calculi, hepatic steatosis, aortic atherosclerosis, no acute intra abdominal process.     PCCM was consulted for Medical Management in this critically ill patient in septic shock and respiratory failure suspected due to polysubstance abuse and aspiration Pneumonia   2/12: Admitted with AMS due to polysubstance abuse. Septic shock, levophed, cultures sent, intubated  02/13: Brainstem reflexes improved today with regain of  cough, gag, pupils reactive.  02/14: Improved mental status but not yet following commands.  02/16: Extubated to nasal cannula.  With right sided weakness in upper and lower extremity and facial twitches. Febrile to 101. Neurology is consulted.  2/18: [facility] assumed care 2/20: Mental status waxes and wanes.  Delirium noted last night 2/25: Mental status improved.  Motivated and working with therapy   Assessment & Plan:   Principal Problem:   Acute hypoxic respiratory failure (HCC) Active Problems:   Rhabdomyolysis   Aspiration pneumonia (HCC)   AKI (acute kidney injury) (HCC)   Chronic obstructive pulmonary disease (HCC)   Chronic pain syndrome   Post-traumatic stress disorder   Gastroesophageal reflux disease with esophagitis   High cholesterol   Moderate persistent asthma without complication   Tobacco use disorder   Acute respiratory failure (HCC)  # Unintentional opioid overdose # Opioid use disorder # Chronic pain Denies intentional overdose, denies SI Plan: - will need close outpatient f/u with pain medicine and plan to wean off opioids vs suboxone/subutex or other OUD treatment - continue home prn oxy for now - cont home gabapentin, baclofen (baclofen at reduced dose) 2/28 c/o pain in the R foot sole and top of toes, sensitive to touch.  Possible neuropathic pain.  Patient is only on gabapentin.  Ibuprofen 400 mg 3 times daily x 2 doses order placed Patient does not want Voltaren gel 3/1 patient requested to add gabapentin 300 mg as needed twice daily dosing and Voltaren gel for right foot pain.  # Acute mebabolic encephalopathy Noted to be more confused on 2/20, wandering,  removing IV access Neuro workup reassuring Mental status improving Appears to be at baseline Plan: Continue home psych regimen Educate patient on importance of adherence to prescribed regimen Delirium precautions, frequent reorienting measures Mentation has been improving   #  GAD - cont klonopin - cont home buspar   # Hypokalemia - replete and monitor as needed   # AKI Prerenal 2/2 overdose, resolved   # Loose stool Resolved   # Aspiration pneumonia # Acute hypoxic respiratory failure Extubated and now breathing comfortably on room air, most recent cxr no infiltrates. Finished a course of IV abx in the ICU. MRI nothing acute, EEG no seizure -Patient on room air.  Continue to monitor   # Debility PT advising SNF, TOC is aware and pursuing a bed No bed offers yet.  Patient was educated extensively on the importance of regular working with physical therapy.  She agreed and worked with therapy on 2/21.  I recommended daily interactions with therapy, effective recovery mobilization.  Patient has been willingness to work with therapy services  # Facial twitching # Right upper extremity weakness # Myositis/rhabdomyolysis Patient with severe and persistent right upper extremity weakness.  MRI brain and cervical spine overall unrevealing.  Patient does have diffuse soft tissue edema on right shoulder concerning for myositis.  CK has normalized. Plan: Continue to suspect myositis secondary to rhabdomyolysis.  Upper extremity strength is improving on a daily basis.  Patient is motivated to improve and recover.  She has been actively working with therapy and mobility services on a daily basis.  She will benefit from skilled nursing facility placement.     DVT prophylaxis: SQ lovenox Code Status: FULL Family Communication:Daughter via phone 2/19.  Offered to call 2/25 however patient declined Disposition Plan: Status is: Inpatient Remains inpatient appropriate because: Weakness decreased from baseline.  Resolving encephalopathy.  Will need SNF.  She is medically ready for discharge at this time   Level of care: Med-Surg  Consultants:  PCCM Neurology  Procedures:  S/p extubation on 2/16  Antimicrobials: None    Subjective: No significant overnight  events, patient still has pain in the left foot, feels improvement after current pain management.  Denied any complaints. Patient is requesting for any spare walker because her insurance will not cover.  Discussed with Child psychotherapist and we do not have any spare walkers.   Objective: Vitals:   12/21/23 1614 12/22/23 0004 12/22/23 0500 12/22/23 0831  BP: 107/76 106/76  101/78  Pulse: 90 (!) 110  91  Resp: 16 18  18   Temp: 98.1 F (36.7 C) 98.8 F (37.1 C)  98.2 F (36.8 C)  TempSrc:  Oral  Oral  SpO2: 96% 96%  96%  Weight:   82.5 kg   Height:        Intake/Output Summary (Last 24 hours) at 12/22/2023 1504 Last data filed at 12/21/2023 2045 Gross per 24 hour  Intake 300 ml  Output --  Net 300 ml     Filed Weights   12/18/23 0500 12/19/23 0500 12/22/23 0500  Weight: 81.2 kg 80.2 kg 82.5 kg    Examination:  General exam: No acute distress.  Fatigued but in good spirits Respiratory system: Lungs clear, normal WOB, RA Cardiovascular system: S1S2, RRR, no murmur Gastrointestinal system: Soft, NT/ND, normal bowel sounds Central nervous system: Alert and oriented x 3.  No focal deficits Extremities: Right upper extremity weak, improved from prior.  Gait intact Skin: No rashes, lesions or ulcers Psychiatry: Judgement and insight  appear impaired. Mood & affect flattened/confused.     Data Reviewed: I have personally reviewed following labs and imaging studies  CBC: Recent Labs  Lab 12/19/23 0533  WBC 6.9  HGB 11.8*  HCT 35.4*  MCV 92.4  PLT 370    Basic Metabolic Panel: Recent Labs  Lab 12/19/23 0533  NA 135  K 3.3*  CL 104  CO2 22  GLUCOSE 132*  BUN 15  CREATININE 0.59  CALCIUM 9.1  MG 2.0  PHOS 3.8   GFR: Estimated Creatinine Clearance: 80 mL/min (by C-G formula based on SCr of 0.59 mg/dL). Liver Function Tests: No results for input(s): "AST", "ALT", "ALKPHOS", "BILITOT", "PROT", "ALBUMIN" in the last 168 hours.  No results for input(s): "LIPASE",  "AMYLASE" in the last 168 hours. No results for input(s): "AMMONIA" in the last 168 hours.  Coagulation Profile: No results for input(s): "INR", "PROTIME" in the last 168 hours.  Cardiac Enzymes: No results for input(s): "CKTOTAL", "CKMB", "CKMBINDEX", "TROPONINI" in the last 168 hours.  BNP (last 3 results) No results for input(s): "PROBNP" in the last 8760 hours. HbA1C: No results for input(s): "HGBA1C" in the last 72 hours. CBG: No results for input(s): "GLUCAP" in the last 168 hours.  Lipid Profile: No results for input(s): "CHOL", "HDL", "LDLCALC", "TRIG", "CHOLHDL", "LDLDIRECT" in the last 72 hours. Thyroid Function Tests: No results for input(s): "TSH", "T4TOTAL", "FREET4", "T3FREE", "THYROIDAB" in the last 72 hours. Anemia Panel: No results for input(s): "VITAMINB12", "FOLATE", "FERRITIN", "TIBC", "IRON", "RETICCTPCT" in the last 72 hours.  Sepsis Labs: No results for input(s): "PROCALCITON", "LATICACIDVEN" in the last 168 hours.   No results found for this or any previous visit (from the past 240 hours).    Radiology Studies: No results found.   Scheduled Meds:  baclofen  5 mg Oral BID   busPIRone  10 mg Oral TID   diclofenac Sodium  2 g Topical TID   enoxaparin (LOVENOX) injection  40 mg Subcutaneous Q24H   feeding supplement  237 mL Oral TID BM   folic acid  1 mg Oral Daily   gabapentin  300 mg Oral QID   Gerhardt's butt cream   Topical BID   hydrOXYzine  25 mg Oral Once   montelukast  10 mg Oral QHS   nystatin   Topical TID   pantoprazole  40 mg Oral Daily   prazosin  1 mg Oral QHS   QUEtiapine  100 mg Oral Daily   QUEtiapine  200 mg Oral QHS   thiamine  100 mg Oral Daily   Or   thiamine (VITAMIN B1) injection  100 mg Intravenous Daily   Continuous Infusions:   LOS: 18 days    Gillis Santa, MD [facility]   If 7PM-7AM, please contact night-coverage  12/22/2023, 3:04 PM

## 2023-12-23 ENCOUNTER — Other Ambulatory Visit: Payer: Self-pay

## 2023-12-23 DIAGNOSIS — J9601 Acute respiratory failure with hypoxia: Secondary | ICD-10-CM | POA: Diagnosis not present

## 2023-12-23 LAB — BASIC METABOLIC PANEL
Anion gap: 8 (ref 5–15)
BUN: 22 mg/dL (ref 8–23)
CO2: 26 mmol/L (ref 22–32)
Calcium: 9.5 mg/dL (ref 8.9–10.3)
Chloride: 104 mmol/L (ref 98–111)
Creatinine, Ser: 0.67 mg/dL (ref 0.44–1.00)
GFR, Estimated: >60 mL/min (ref >60)
Glucose, Bld: 132 mg/dL — ABNORMAL HIGH (ref 70–99)
Potassium: 3.9 mmol/L (ref 3.5–5.1)
Sodium: 138 mmol/L (ref 135–145)

## 2023-12-23 LAB — CBC
HCT: 37.3 % (ref 36.0–46.0)
Hemoglobin: 12.3 g/dL (ref 12.0–15.0)
MCH: 31.1 pg (ref 26.0–34.0)
MCHC: 33 g/dL (ref 30.0–36.0)
MCV: 94.2 fL (ref 80.0–100.0)
Platelets: 316 10*3/uL (ref 150–400)
RBC: 3.96 MIL/uL (ref 3.87–5.11)
RDW: 13.6 % (ref 11.5–15.5)
WBC: 5.8 10*3/uL (ref 4.0–10.5)
nRBC: 0 % (ref 0.0–0.2)

## 2023-12-23 LAB — MAGNESIUM: Magnesium: 2 mg/dL (ref 1.7–2.4)

## 2023-12-23 LAB — PHOSPHORUS: Phosphorus: 3.7 mg/dL (ref 2.5–4.6)

## 2023-12-23 MED ORDER — GABAPENTIN 300 MG PO CAPS
300.0000 mg | ORAL_CAPSULE | Freq: Four times a day (QID) | ORAL | 3 refills | Status: DC
Start: 2023-12-23 — End: 2024-01-02
  Filled 2023-12-23: qty 120, 30d supply, fill #0

## 2023-12-23 MED ORDER — POLYETHYLENE GLYCOL 3350 17 GM/SCOOP PO POWD
17.0000 g | Freq: Two times a day (BID) | ORAL | 0 refills | Status: AC
  Filled 2023-12-23: qty 238, 7d supply, fill #0

## 2023-12-23 MED ORDER — ALBUTEROL SULFATE HFA 108 (90 BASE) MCG/ACT IN AERS
2.0000 | INHALATION_SPRAY | Freq: Four times a day (QID) | RESPIRATORY_TRACT | 2 refills | Status: AC | PRN
  Filled 2023-12-23: qty 6.7, 30d supply, fill #0

## 2023-12-23 MED ORDER — VITAMIN B-1 100 MG PO TABS
100.0000 mg | ORAL_TABLET | Freq: Every day | ORAL | 0 refills | Status: DC
  Filled 2023-12-23: qty 30, 30d supply, fill #0

## 2023-12-23 MED ORDER — DICLOFENAC SODIUM 1 % EX GEL
2.0000 g | Freq: Three times a day (TID) | CUTANEOUS | 0 refills | Status: AC
  Filled 2023-12-23: qty 100, 16d supply, fill #0

## 2023-12-23 MED ORDER — QUETIAPINE FUMARATE 100 MG PO TABS
100.0000 mg | ORAL_TABLET | Freq: Every day | ORAL | 2 refills | Status: DC
  Filled 2023-12-23: qty 30, 30d supply, fill #0

## 2023-12-23 MED ORDER — BACLOFEN 10 MG PO TABS
10.0000 mg | ORAL_TABLET | Freq: Two times a day (BID) | ORAL | 0 refills | Status: DC
  Filled 2023-12-23: qty 30, 15d supply, fill #0

## 2023-12-23 MED ORDER — BUSPIRONE HCL 10 MG PO TABS
10.0000 mg | ORAL_TABLET | Freq: Four times a day (QID) | ORAL | 2 refills | Status: DC
Start: 2023-12-23 — End: 2024-01-02
  Filled 2023-12-23: qty 120, 30d supply, fill #0

## 2023-12-23 MED ORDER — BISACODYL 5 MG PO TBEC
10.0000 mg | DELAYED_RELEASE_TABLET | Freq: Every evening | ORAL | 0 refills | Status: AC | PRN
  Filled 2023-12-23: qty 30, 15d supply, fill #0

## 2023-12-23 MED ORDER — OXYCODONE-ACETAMINOPHEN 5-325 MG PO TABS
1.0000 | ORAL_TABLET | Freq: Four times a day (QID) | ORAL | 0 refills | Status: DC | PRN
  Filled 2023-12-23: qty 20, 5d supply, fill #0

## 2023-12-23 MED ORDER — PRAZOSIN HCL 1 MG PO CAPS
1.0000 mg | ORAL_CAPSULE | Freq: Every day | ORAL | 2 refills | Status: DC
Start: 2023-12-23 — End: 2024-01-02
  Filled 2023-12-23: qty 30, 30d supply, fill #0

## 2023-12-23 NOTE — Progress Notes (Signed)
 Mobility Specialist - Progress Note   12/23/23 1100  Mobility  Activity Refused mobility     Pt declined mobility at this time as she's trying to work on d/c planning. Pt asks to return this afternoon for session. Will attempt later this date.   Filiberto Pinks Mobility Specialist 12/23/23, 11:49 AM

## 2023-12-23 NOTE — Plan of Care (Signed)
 Patient alert and oriented. No distress noted, no complaints voiced. Will continue to monitor.   Problem: Clinical Measurements: Goal: Ability to maintain clinical measurements within normal limits will improve Outcome: Progressing   Problem: Clinical Measurements: Goal: Diagnostic test results will improve Outcome: Progressing   Problem: Activity: Goal: Risk for activity intolerance will decrease Outcome: Progressing   Problem: Elimination: Goal: Will not experience complications related to bowel motility Outcome: Progressing   Problem: Pain Managment: Goal: General experience of comfort will improve and/or be controlled Outcome: Progressing   Problem: Safety: Goal: Ability to remain free from injury will improve Outcome: Progressing

## 2023-12-23 NOTE — Progress Notes (Signed)
 PROGRESS NOTE    Tami Hopkins  ZOX:[id number] DOB: [birth year] DOA: 12/04/2023 PCP: Ardyth Man, PA-C    Brief Narrative:   Tami Hopkins is a 63 y.o. female with a PMH significant for HLD, COPD, Asthma, Tobacco use, Chronic pain syndrome, Anxiety, PTSD, IBS, and GERD who presents to the ED for altered mental status.  Per chart review, family had not heard from the patient for the past 2 days and called for a wellness check.  Patient subsequently found unresponsive sitting on the couch, roommate stated that they thought patient had been sleeping on the couch for the past few days.  Of note, patient was admitted to the hospital a couple of months ago for altered mental status due to suspected polypharmacy use.  Upon EMS arrival patient was given 2 mg of intranasal Narcan with no effect, subsequently given 2 mg IV Narcan with slight respiratory improvement, yet remained unresponsive.  Upon arrival to the ER, BP 94/78, afebrile, 100% on room air, she was noted to have vomit in her airway during suctioning. She was intubated for airway protection. Labs revealed K 6.0, Creat 3.72, BUN 62, AST 162, ALT 85, CK 4155, Troponin 91 and 74, lactic acid 1.6, WBC 19.0, Drug screen + benzodiazepine, opiate, tricyclics. She was given a total of 3 liters of IVF, started on broad spectrum antibiotics, cultures sent, and started on Norepinephrine infusion. CT c-spine without noted fracture, CT head without acute pathology, CT A/P/T bilateral atelectasis, stable non obstructing right renal calculi, hepatic steatosis, aortic atherosclerosis, no acute intra abdominal process.     PCCM was consulted for Medical Management in this critically ill patient in septic shock and respiratory failure suspected due to polysubstance abuse and aspiration Pneumonia   2/12: Admitted with AMS due to polysubstance abuse. Septic shock, levophed, cultures sent, intubated  02/13: Brainstem reflexes improved today with regain of  cough, gag, pupils reactive.  02/14: Improved mental status but not yet following commands.  02/16: Extubated to nasal cannula.  With right sided weakness in upper and lower extremity and facial twitches. Febrile to 101. Neurology is consulted.  2/18: [facility] hospital[facility] assumed care 2/20: Mental status waxes and wanes.  Delirium noted last night 2/25: Mental status improved.  Motivated and working with therapy   Assessment & Plan:   Principal Problem:   Acute hypoxic respiratory failure (HCC) Active Problems:   Rhabdomyolysis   Aspiration pneumonia (HCC)   AKI (acute kidney injury) (HCC)   Chronic obstructive pulmonary disease (HCC)   Chronic pain syndrome   Post-traumatic stress disorder   Gastroesophageal reflux disease with esophagitis   High cholesterol   Moderate persistent asthma without complication   Tobacco use disorder   Acute respiratory failure (HCC)  # Unintentional opioid overdose # Opioid use disorder # Chronic pain Denies intentional overdose, denies SI Plan: - will need close outpatient f/u with pain medicine and plan to wean off opioids vs suboxone/subutex or other OUD treatment - continue home prn oxy for now - cont home gabapentin, baclofen (baclofen at reduced dose) 2/28 c/o pain in the R foot sole and top of toes, sensitive to touch.  Possible neuropathic pain.  Patient is only on gabapentin.  Ibuprofen 400 mg 3 times daily x 2 doses order placed Patient does not want Voltaren gel 3/1 patient requested to add gabapentin 300 mg as needed twice daily dosing and Voltaren gel for right foot pain.  # Acute mebabolic encephalopathy Noted to be more confused on 2/20, wandering,  removing IV access Neuro workup reassuring Mental status improving Appears to be at baseline Plan: Continue home psych regimen Educate patient on importance of adherence to prescribed regimen Delirium precautions, frequent reorienting measures Mentation has been improving   #  GAD - cont klonopin - cont home buspar   # Hypokalemia - replete and monitor as needed   # AKI Prerenal 2/2 overdose, resolved   # Loose stool Resolved   # Aspiration pneumonia # Acute hypoxic respiratory failure Extubated and now breathing comfortably on room air, most recent cxr no infiltrates. Finished a course of IV abx in the ICU. MRI nothing acute, EEG no seizure -Patient on room air.  Continue to monitor   # Debility PT advising SNF, TOC is aware and pursuing a bed No bed offers yet.  Patient was educated extensively on the importance of regular working with physical therapy.  She agreed and worked with therapy on 2/21.  I recommended daily interactions with therapy, effective recovery mobilization.  Patient has been willingness to work with therapy services  # Facial twitching # Right upper extremity weakness # Myositis/rhabdomyolysis Patient with severe and persistent right upper extremity weakness.  MRI brain and cervical spine overall unrevealing.  Patient does have diffuse soft tissue edema on right shoulder concerning for myositis.  CK has normalized. Plan: Continue to suspect myositis secondary to rhabdomyolysis.  Upper extremity strength is improving on a daily basis.  Patient is motivated to improve and recover.  She has been actively working with therapy and mobility services on a daily basis.  She will benefit from skilled nursing facility placement. 3/3 patient is requesting a sling for the right arm and heating pad for the right foot pain  DVT prophylaxis: SQ lovenox Code Status: FULL Family Communication:Daughter via phone 2/19.  Offered to call 2/25 however patient declined Disposition Plan: Status is: Inpatient Remains inpatient appropriate because: Weakness decreased from baseline.  Resolving encephalopathy.  Will need SNF.  She is medically ready for discharge at this time   Level of care: Med-Surg  Consultants:  PCCM Neurology  Procedures:  S/p  extubation on 2/16  Antimicrobials: None    Subjective: No significant overnight events, still complaining of pain in the right foot 8/10, denied any other complaint.  Patient is requesting sling for the right arm.  And heating pad for the foot.    Objective: Vitals:   12/22/23 2100 12/23/23 0221 12/23/23 0818 12/23/23 1553  BP: 104/74  97/72 113/82  Pulse: 90  99 86  Resp: 18  18 18   Temp: 97.9 F (36.6 C)  98.1 F (36.7 C) 97.9 F (36.6 C)  TempSrc: Oral  Oral Oral  SpO2: 95%  95% 98%  Weight:  82 kg    Height:       No intake or output data in the 24 hours ending 12/23/23 1605    Filed Weights   12/19/23 0500 12/22/23 0500 12/23/23 0221  Weight: 80.2 kg 82.5 kg 82 kg    Examination:  General exam: No acute distress.  Fatigued but in good spirits Respiratory system: Lungs clear, normal WOB, RA Cardiovascular system: S1S2, RRR, no murmur Gastrointestinal system: Soft, NT/ND, normal bowel sounds Central nervous system: Alert and oriented x 3.  No focal deficits Extremities: Right upper extremity weak, improved from prior.  Gait intact Skin: No rashes, lesions or ulcers Psychiatry: Judgement and insight appear impaired. Mood & affect flattened/confused.     Data Reviewed: I have personally reviewed following labs and  imaging studies  CBC: Recent Labs  Lab 12/19/23 0533 12/23/23 0823  WBC 6.9 5.8  HGB 11.8* 12.3  HCT 35.4* 37.3  MCV 92.4 94.2  PLT 370 316    Basic Metabolic Panel: Recent Labs  Lab 12/19/23 0533 12/23/23 0823  NA 135 138  K 3.3* 3.9  CL 104 104  CO2 22 26  GLUCOSE 132* 132*  BUN 15 22  CREATININE 0.59 0.67  CALCIUM 9.1 9.5  MG 2.0 2.0  PHOS 3.8 3.7   GFR: Estimated Creatinine Clearance: 79.7 mL/min (by C-G formula based on SCr of 0.67 mg/dL). Liver Function Tests: No results for input(s): "AST", "ALT", "ALKPHOS", "BILITOT", "PROT", "ALBUMIN" in the last 168 hours.  No results for input(s): "LIPASE", "AMYLASE" in the last  168 hours. No results for input(s): "AMMONIA" in the last 168 hours.  Coagulation Profile: No results for input(s): "INR", "PROTIME" in the last 168 hours.  Cardiac Enzymes: No results for input(s): "CKTOTAL", "CKMB", "CKMBINDEX", "TROPONINI" in the last 168 hours.  BNP (last 3 results) No results for input(s): "PROBNP" in the last 8760 hours. HbA1C: No results for input(s): "HGBA1C" in the last 72 hours. CBG: No results for input(s): "GLUCAP" in the last 168 hours.  Lipid Profile: No results for input(s): "CHOL", "HDL", "LDLCALC", "TRIG", "CHOLHDL", "LDLDIRECT" in the last 72 hours. Thyroid Function Tests: No results for input(s): "TSH", "T4TOTAL", "FREET4", "T3FREE", "THYROIDAB" in the last 72 hours. Anemia Panel: No results for input(s): "VITAMINB12", "FOLATE", "FERRITIN", "TIBC", "IRON", "RETICCTPCT" in the last 72 hours.  Sepsis Labs: No results for input(s): "PROCALCITON", "LATICACIDVEN" in the last 168 hours.   No results found for this or any previous visit (from the past 240 hours).    Radiology Studies: No results found.   Scheduled Meds:  baclofen  5 mg Oral BID   bisacodyl  10 mg Oral Once   bisacodyl  10 mg Oral QHS   busPIRone  10 mg Oral TID   diclofenac Sodium  2 g Topical TID   enoxaparin (LOVENOX) injection  40 mg Subcutaneous Q24H   feeding supplement  237 mL Oral TID BM   folic acid  1 mg Oral Daily   gabapentin  300 mg Oral QID   Gerhardt's butt cream   Topical BID   hydrOXYzine  25 mg Oral Once   montelukast  10 mg Oral QHS   nystatin   Topical TID   pantoprazole  40 mg Oral Daily   polyethylene glycol  17 g Oral BID   prazosin  1 mg Oral QHS   QUEtiapine  100 mg Oral Daily   QUEtiapine  200 mg Oral QHS   thiamine  100 mg Oral Daily   Or   thiamine (VITAMIN B1) injection  100 mg Intravenous Daily   Continuous Infusions:   LOS: 19 days    Gillis Santa, MD [facility]   If 7PM-7AM, please contact night-coverage  12/23/2023,  4:05 PM

## 2023-12-24 ENCOUNTER — Telehealth: Payer: Self-pay | Admitting: Anesthesiology

## 2023-12-24 ENCOUNTER — Other Ambulatory Visit: Payer: Self-pay

## 2023-12-24 DIAGNOSIS — J9601 Acute respiratory failure with hypoxia: Secondary | ICD-10-CM | POA: Diagnosis not present

## 2023-12-24 MED ORDER — HYDROCORTISONE ACETATE 25 MG RE SUPP: 25.0000 mg | Freq: Two times a day (BID) | RECTAL | Status: DC | PRN

## 2023-12-24 MED ORDER — HYDROCORTISONE ACETATE 25 MG RE SUPP
25.0000 mg | Freq: Two times a day (BID) | RECTAL | Status: AC
  Administered 2023-12-24 – 2023-12-25 (×3): 25 mg via RECTAL
  Filled 2023-12-24 (×5): qty 1

## 2023-12-24 NOTE — Telephone Encounter (Signed)
 Attempted to call patient, message left.

## 2023-12-24 NOTE — Progress Notes (Signed)
 Physical Therapy Treatment Patient Details Name: Tami Hopkins MRN: [id number] DOB: 09-29-1961 Today's Date: 12/24/2023   History of Present Illness This is a 63 year old woman with a history of hyperlipidemia, COPD, asthma, tobacco use, chronic pain syndrome, anxiety, PTSD, IBS, and GERD who presented to the ED for unresponsiveness after being found down at home, possibly for up to 2 days, requiring intubation and admission to the ICU. Etiology favored to be narcotic overdose or polypharmacy.    PT Comments  Good functional progress noted today while assisting pt with safe transition planning to potential apartment. Pt currently is S/I for bed mobility and transfers. RW for safety during gait training 200+ ft with Supervision, not c/o pain or LOB. Stair training with single rail x 3 steps, step to pattern with CG/S. Great tolerance for increased activity. Pt very hopeful on [facility] in [place] accepting her application from today. Continue PT per POC.   If plan is discharge home, recommend the following: A little help with walking and/or transfers;A little help with bathing/dressing/bathroom;Assistance with cooking/housework;Assist for transportation;Help with stairs or ramp for entrance;Direct supervision/assist for financial management   Can travel by private vehicle     Yes  Equipment Recommendations  Rollator (4 wheels);Rolling walker (2 wheels)    Recommendations for Other Services       Precautions / Restrictions Precautions Precautions: Fall Recall of Precautions/Restrictions: Intact Restrictions Weight Bearing Restrictions Per Provider Order: No     Mobility  Bed Mobility Overal bed mobility: Modified Independent             General bed mobility comments: use of bed features for repositioning and rolling in bed    Transfers Overall transfer level: Needs assistance Equipment used: Rolling walker (2 wheels) Transfers: Sit to/from Stand Sit to  Stand: Supervision           General transfer comment: Steady at RW upon standing    Ambulation/Gait Ambulation/Gait assistance: Supervision Gait Distance (Feet): 200 Feet Assistive device: Rolling walker (2 wheels) Gait Pattern/deviations: Step-through pattern Gait velocity: decreased     General Gait Details: No LOB with frequent tuns and maneuvering around objects   Stairs Stairs: Yes Stairs assistance: Contact guard assist Stair Management: One rail Left, Step to pattern Number of Stairs: 3 General stair comments: Good safety awareness on stairs, no buckling noted   Wheelchair Mobility     Tilt Bed    Modified Rankin (Stroke Patients Only)       Balance Overall balance assessment: Needs assistance Sitting-balance support: Feet supported Sitting balance-Leahy Scale: Good     Standing balance support: Bilateral upper extremity supported, During functional activity, Reliant on assistive device for balance Standing balance-Leahy Scale: Fair                              Hotel manager: No apparent difficulties  Cognition Arousal: Alert Behavior During Therapy: WFL for tasks assessed/performed                             Following commands: Intact Following commands impaired: Follows one step commands with increased time    Cueing    Exercises      General Comments General comments (skin integrity, edema, etc.): Discussed at length with pt level of safe functional mobility in order to safely d/c from hospital. Answered questions and concerns regarding realistic expectations and indepence needed to rejoin  the community, pt with good understanding      Pertinent Vitals/Pain Pain Assessment Pain Assessment: Faces Faces Pain Scale: Hurts little more Pain Location: R foot Pain Descriptors / Indicators: Cramping Pain Intervention(s): Monitored during session    Home Living                           Prior Function            PT Goals (current goals can now be found in the care plan section) Acute Rehab PT Goals Patient Stated Goal: "Get better so I can eventually go home." Progress towards PT goals: Progressing toward goals    Frequency    Min 1X/week      PT Plan      Co-evaluation              AM-PAC PT "6 Clicks" Mobility   Outcome Measure  Help needed turning from your back to your side while in a flat bed without using bedrails?: A Little Help needed moving from lying on your back to sitting on the side of a flat bed without using bedrails?: A Little Help needed moving to and from a bed to a chair (including a wheelchair)?: A Little Help needed standing up from a chair using your arms (e.g., wheelchair or bedside chair)?: A Little Help needed to walk in hospital room?: A Little Help needed climbing 3-5 steps with a railing? : A Little 6 Click Score: 18    End of Session Equipment Utilized During Treatment: Gait belt Activity Tolerance: Patient tolerated treatment well Patient left: in bed;with call bell/phone within reach;with family/visitor present Nurse Communication: Mobility status PT Visit Diagnosis: Other abnormalities of gait and mobility (R26.89);Difficulty in walking, not elsewhere classified (R26.2);Muscle weakness (generalized) (M62.81)     Time: 8295-6213 PT Time Calculation (min) (ACUTE ONLY): 44 min  Charges:    $Gait Training: 8-22 mins $Therapeutic Activity: 23-37 mins PT General Charges $$ ACUTE PT VISIT: 1 Visit                    Zadie Cleverly, PTA  Jannet Askew 12/24/2023, 4:25 PM

## 2023-12-24 NOTE — Telephone Encounter (Signed)
 Call [phone] / patient is still in the hospital

## 2023-12-24 NOTE — Progress Notes (Signed)
 Nutrition Follow-up  DOCUMENTATION CODES:   Not applicable  INTERVENTION:   -Continue regular diet -Continue MVI with minerals daily -Continue Ensure Enlive po TID, each supplement provides 350 kcal and 20 grams of protein -RD will sign off secondary to medical stability; if further nutrition-related concerns are identified, please re-consult RD  NUTRITION DIAGNOSIS:   Inadequate oral intake related to inability to eat (pt sedated and ventilated) as evidenced by NPO status.  Progressing; advanced to PO diet on 12/08/23   GOAL:   Patient will meet greater than or equal to 90% of their needs  Progressing   MONITOR:   PO intake, Supplement acceptance, Labs, Weight trends, I & O's, Skin  REASON FOR ASSESSMENT:   Consult Enteral/tube feeding initiation and management  ASSESSMENT:   63 y.o. female with h/o HLD, COPD, asthma, tobacco use, DDD. chronic pain syndrome, anxiety, PTSD, IBS and GERD who is admitted with polypharmacy, OD, rhabdomyolysis, aspiration PNA and AKI.  2/16- extubated, advanced to full liquid diet 2/17- rectal tube removed 2/18- advanced to regular diet  Reviewed I/O's: +721 ml x 24 hours  Pt receiving personal care at time of visit.   Pt remains on a regular diet with fair oral intake. Noted meal completions 50-100%. Pt also drinking Ensure supplements.   Per TOC notes, still awaiting appropriate discharge disposition. Pt has progressed with therapy enough to be able to go home, however, family is unable to take care of her.   Wt has been stable over the past week.   Medications reviewed and include dulcolax, neurontin, protonix, miralax, and thiamine.   Labs reviewed: CBGS: 108 (inpatient orders for glycemic control are none).    Diet Order:   Diet Order             Diet regular Room service appropriate? Yes; Fluid consistency: Thin  Diet effective now                   EDUCATION NEEDS:   No education needs have been identified at  this time  Skin:  Skin Assessment: Skin Integrity Issues: Skin Integrity Issues:: Other (Comment) Other: IAD to perineum  Last BM:  12/24/23  Height:   Ht Readings from Last 1 Encounters:  12/04/23 5\' 8"  (1.727 m)    Weight:   Wt Readings from Last 1 Encounters:  12/23/23 82 kg    Ideal Body Weight:  70 kg  BMI:  Body mass index is 27.49 kg/m.  Estimated Nutritional Needs:   Kcal:  1900-2200kcal/day  Protein:  95-110g/day  Fluid:  1.9-2.2L/day    Levada Schilling, RD, LDN, CDCES Registered Dietitian III Certified Diabetes Care and Education Specialist If unable to reach this RD, please use "RD Inpatient" group chat on secure chat between hours of 8am-4 pm daily

## 2023-12-24 NOTE — Discharge Instructions (Addendum)
 Rent/Utility/Housing  Agency Name: [facility] Agency Address: [address] [place], [place] [address] Phone: [phone] Email: [email or web address] Website: [email or web address] Service(s) Offered: Housing services, self-sufficiency, congregate meal program, weatherization program, Field seismologist program, emergency food assistance,  housing counseling, home ownership program, wheels -towork program.  Agency Name: [facility] Address: [address] [place], [place] [address] Phone: [phone] (8a-4p) [phone] (8p- 10p) Email: [email or web address] Website: [email or web address] Service(s) Offered: A program for homeless and/or needy men that includes one-on-one counseling, life skills training and job rehabilitation.  Agency Name: [facility] Address: [address] [place], [place] [address] Phone: [phone] Website: www.alliedchurches.org Service(s) Offered: Assistance to needy in emergency with utility bills, heating fuel, and prescriptions. Shelter for homeless 7pm-7am. February 14, 2017 15  Agency Name: [facility] [place] [facility] Address: [address], [place], [place] [address] Phone: [phone] Contact Person: Cathleen Corti Email: [email or web address] Website: [email or web address] Service(s) Offered: Helps individuals with developmental disabilities move from housing that is more restrictive to homes where they  can achieve greater independence and have more  opportunities.  Agency Name: [facility] Address: [address], [place], [place] [address] Phone: [phone] Email: [email or web address] Website: www.[email or web address].org Service(s) Offered: Provides affordable housing for low-income families, elderly, and disabled individuals. Offer a wide range of  programs and services, from financial planning to  afterschool and summer programs.  Agency Name: [facility] Address: [address] [place], [place] [address] Phone: [phone] Service(s) Offered: Child support services; child welfare services; food stamps; Medicaid; work first family assistance; and aid with fuel,  rent, food and medicine.  Agency Name: [facility] [place] [employer] Address: [facility] [address], [place], [place]  [address] Phone: [phone] Website: www.familyabuseservices.org Service(s) Offered: 24 hour Crisis Line: [phone]; 24 hour Emergency Shelter; Transitional Housing; Support Groups; Scientist, physiological; [employer]; Hispanic Outreach: [phone];  Visitation Center: [phone].  Agency Name: [facility] [place]. Address: [address] [place], [place] [address] Phone: [phone] Service(s) Offered: CAP Services; Home and [employer]; Individual or Group Supports; Respite Care Non-Institutional Nursing;  Residential Supports; Respite Care and Personal Care Services; Transportation; Family and Friends Night; Recreational Activities; Three Nutritious Meals/Snacks; Consultation with Registered Dietician; Twenty-four hour Registered Nurse Access; Daily and [employer]; Camp [employer] Leaves; [place] for the [employer] (During Summer Months) Bingo Night (Every  Wednesday Night); Special Populations Dance Night  (Every Tuesday Night); Professional Hair Care Services.  Agency Name: [facility] Address: [address], [place], [place] [address] Phone: [phone] Contact Person: Jabier Mutton Website: [email or web address].Physicist, medical) Offered: Residential treatment facility for women; food and  clothing, educational & employment development and  transportation to work; Counsellor of financial skills;  parenting and family reunification; emotional and spiritual  support;  transitional housing for program graduates.  Agency Name: [facility] Address: [address] [place], [place] [address] Phone: [phone] Email: [email or web address] Website: [email or web address].es Service(s) Offered: Public housing units for elderly, disabled, and low income people; housing choice vouchers for income eligible  applicants; shelter plus care vouchers; and Psychologist, clinical.  Agency Name: [facility] [employer] Address: [address] [place], [place] [address] Phone: [phone] Email: [email or web address] Website: www.habitatalamance.org Service(s) Offered: Build houses for families in need of decent housing. Each adult in the family must invest 200 hours of labor on  someone else's house, work with volunteers to build their own house, attend classes  on budgeting, home maintenance, yard care, and attend homeowner association meetings.  Agency Name: [facility] Address: [address], [place], [place] [address] Phone: [phone] Website: www.rsli.org Service(s) Offered: Intermediate care facilities for intellectually delayed, Supervised Living in group homes for adults with developmental disabilities, Supervised Living for people who have dual diagnoses (MRMI), Independent Living, Supported Living, respite and a variety of CAP services, pre-vocational services, day supports, and [employer].  Agency Name: [place] [employer] Phone: [phone] Website: www.NCForeclosurePrevention.gov Service(s) Offered: Zero-interest, deferred loans to homeowners struggling to pay their mortgage. Call for more information.

## 2023-12-24 NOTE — Progress Notes (Signed)
 PROGRESS NOTE    IFEOMA VALLIN  UJW:[id number] DOB: 03-11-1961 DOA: 12/04/2023 PCP: Ardyth Man, PA-C    Brief Narrative:   Tami Hopkins is a 63 y.o. female with a PMH significant for HLD, COPD, Asthma, Tobacco use, Chronic pain syndrome, Anxiety, PTSD, IBS, and GERD who presents to the ED for altered mental status.  Per chart review, family had not heard from the patient for the past 2 days and called for a wellness check.  Patient subsequently found unresponsive sitting on the couch, roommate stated that they thought patient had been sleeping on the couch for the past few days.  Of note, patient was admitted to the hospital a couple of months ago for altered mental status due to suspected polypharmacy use.  Upon EMS arrival patient was given 2 mg of intranasal Narcan with no effect, subsequently given 2 mg IV Narcan with slight respiratory improvement, yet remained unresponsive.  Upon arrival to the ER, BP 94/78, afebrile, 100% on room air, she was noted to have vomit in her airway during suctioning. She was intubated for airway protection. Labs revealed K 6.0, Creat 3.72, BUN 62, AST 162, ALT 85, CK 4155, Troponin 91 and 74, lactic acid 1.6, WBC 19.0, Drug screen + benzodiazepine, opiate, tricyclics. She was given a total of 3 liters of IVF, started on broad spectrum antibiotics, cultures sent, and started on Norepinephrine infusion. CT c-spine without noted fracture, CT head without acute pathology, CT A/P/T bilateral atelectasis, stable non obstructing right renal calculi, hepatic steatosis, aortic atherosclerosis, no acute intra abdominal process.     PCCM was consulted for Medical Management in this critically ill patient in septic shock and respiratory failure suspected due to polysubstance abuse and aspiration Pneumonia   2/12: Admitted with AMS due to polysubstance abuse. Septic shock, levophed, cultures sent, intubated  02/13: Brainstem reflexes improved today with regain of  cough, gag, pupils reactive.  02/14: Improved mental status but not yet following commands.  02/16: Extubated to nasal cannula.  With right sided weakness in upper and lower extremity and facial twitches. Febrile to 101. Neurology is consulted.  2/18: [facility] assumed care 2/20: Mental status waxes and wanes.  Delirium noted last night 2/25: Mental status improved.  Motivated and working with therapy   Assessment & Plan:   Principal Problem:   Acute hypoxic respiratory failure (HCC) Active Problems:   Rhabdomyolysis   Aspiration pneumonia (HCC)   AKI (acute kidney injury) (HCC)   Chronic obstructive pulmonary disease (HCC)   Chronic pain syndrome   Post-traumatic stress disorder   Gastroesophageal reflux disease with esophagitis   High cholesterol   Moderate persistent asthma without complication   Tobacco use disorder   Acute respiratory failure (HCC)  # Unintentional opioid overdose # Opioid use disorder # Chronic pain Denies intentional overdose, denies SI Plan: - will need close outpatient f/u with pain medicine and plan to wean off opioids vs suboxone/subutex or other OUD treatment - continue home prn oxy for now - cont home gabapentin, baclofen (baclofen at reduced dose) 2/28 c/o pain in the R foot sole and top of toes, sensitive to touch.  Possible neuropathic pain.  Patient is only on gabapentin.  Ibuprofen 400 mg 3 times daily x 2 doses order placed Patient does not want Voltaren gel 3/1 patient requested to add gabapentin 300 mg as needed twice daily dosing and Voltaren gel for right foot pain.  # Acute mebabolic encephalopathy Noted to be more confused on 2/20, wandering,  removing IV access Neuro workup reassuring Mental status improving Appears to be at baseline Plan: Continue home psych regimen Educate patient on importance of adherence to prescribed regimen Delirium precautions, frequent reorienting measures Mentation has been improving   #  GAD - cont klonopin - cont home buspar   # Hypokalemia - replete and monitor as needed   # AKI Prerenal 2/2 overdose, resolved   # Loose stool Resolved   # Aspiration pneumonia # Acute hypoxic respiratory failure Extubated and now breathing comfortably on room air, most recent cxr no infiltrates. Finished a course of IV abx in the ICU. MRI nothing acute, EEG no seizure -Patient on room air.  Continue to monitor   # Debility PT advising SNF, TOC is aware and pursuing a bed No bed offers yet.  Patient was educated extensively on the importance of regular working with physical therapy.  She agreed and worked with therapy on 2/21.  I recommended daily interactions with therapy, effective recovery mobilization.  Patient has been willingness to work with therapy services  # Facial twitching # Right upper extremity weakness # Myositis/rhabdomyolysis Patient with severe and persistent right upper extremity weakness.  MRI brain and cervical spine overall unrevealing.  Patient does have diffuse soft tissue edema on right shoulder concerning for myositis.  CK has normalized. Plan: Continue to suspect myositis secondary to rhabdomyolysis.  Upper extremity strength is improving on a daily basis.  Patient is motivated to improve and recover.  She has been actively working with therapy and mobility services on a daily basis.  She will benefit from skilled nursing facility placement. 3/3 patient is requesting a sling for the right arm and heating pad for the right foot pain  Hemorrhoids 3/4 today patient was requesting for hemorrhoidal suppositories which has been ordered    DVT prophylaxis: SQ lovenox Code Status: FULL Family Communication:Daughter via phone 2/19.  Offered to call 2/25 however patient declined Disposition Plan: Status is: Inpatient Remains inpatient appropriate because: Weakness decreased from baseline.  Resolving encephalopathy.  Will need SNF.  She is medically ready for  discharge at this time 3/4 as per [facility] SNF will not take her as patient has no place to go afterwards.  Patient cannot go back to her partner as she is scared, so she is homeless.  DSS worker provided low income housing options and patient has applied [employer] in [place]. Stable to discharge once patient finds the apartment. Reconciled medications and sent scripts to [facility] pharmacy except hemorrhoidal suppositories.  Level of care: Med-Surg  Consultants:  PCCM Neurology  Procedures:  S/p extubation on 2/16  Antimicrobials: None    Subjective: No significant overnight events, still complaining of right lower extremity sciatica pain, requesting for more pain medication but I did not increase any pain meds. Patient seems to be resting comfortably and I think she is just seeking for more pain medications. Today she requested for hemorrhoidal suppositories.   Objective: Vitals:   12/23/23 0818 12/23/23 1553 12/23/23 2340 12/24/23 0754  BP: 97/72 113/82 (!) 90/52 102/63  Pulse: 99 86 95 84  Resp: 18 18 17 17   Temp: 98.1 F (36.7 C) 97.9 F (36.6 C) 98 F (36.7 C) 97.9 F (36.6 C)  TempSrc: Oral Oral Oral   SpO2: 95% 98% 94% 97%  Weight:      Height:       No intake or output data in the 24 hours ending 12/24/23 1502    Filed Weights   12/19/23 0500  12/22/23 0500 12/23/23 0221  Weight: 80.2 kg 82.5 kg 82 kg    Examination:  General exam: No acute distress.  Fatigued but in good spirits Respiratory system: Lungs clear, normal WOB, RA Cardiovascular system: S1S2, RRR, no murmur Gastrointestinal system: Soft, NT/ND, normal bowel sounds Central nervous system: Alert and oriented x 3.  No focal deficits Extremities: Right upper extremity weak, improved from prior.  Gait intact Skin: No rashes, lesions or ulcers Psychiatry: Judgement and insight appear impaired. Mood & affect flattened/confused.     Data Reviewed: I have personally reviewed following  labs and imaging studies  CBC: Recent Labs  Lab 12/19/23 0533 12/23/23 0823  WBC 6.9 5.8  HGB 11.8* 12.3  HCT 35.4* 37.3  MCV 92.4 94.2  PLT 370 316    Basic Metabolic Panel: Recent Labs  Lab 12/19/23 0533 12/23/23 0823  NA 135 138  K 3.3* 3.9  CL 104 104  CO2 22 26  GLUCOSE 132* 132*  BUN 15 22  CREATININE 0.59 0.67  CALCIUM 9.1 9.5  MG 2.0 2.0  PHOS 3.8 3.7   GFR: Estimated Creatinine Clearance: 79.7 mL/min (by C-G formula based on SCr of 0.67 mg/dL). Liver Function Tests: No results for input(s): "AST", "ALT", "ALKPHOS", "BILITOT", "PROT", "ALBUMIN" in the last 168 hours.  No results for input(s): "LIPASE", "AMYLASE" in the last 168 hours. No results for input(s): "AMMONIA" in the last 168 hours.  Coagulation Profile: No results for input(s): "INR", "PROTIME" in the last 168 hours.  Cardiac Enzymes: No results for input(s): "CKTOTAL", "CKMB", "CKMBINDEX", "TROPONINI" in the last 168 hours.  BNP (last 3 results) No results for input(s): "PROBNP" in the last 8760 hours. HbA1C: No results for input(s): "HGBA1C" in the last 72 hours. CBG: No results for input(s): "GLUCAP" in the last 168 hours.  Lipid Profile: No results for input(s): "CHOL", "HDL", "LDLCALC", "TRIG", "CHOLHDL", "LDLDIRECT" in the last 72 hours. Thyroid Function Tests: No results for input(s): "TSH", "T4TOTAL", "FREET4", "T3FREE", "THYROIDAB" in the last 72 hours. Anemia Panel: No results for input(s): "VITAMINB12", "FOLATE", "FERRITIN", "TIBC", "IRON", "RETICCTPCT" in the last 72 hours.  Sepsis Labs: No results for input(s): "PROCALCITON", "LATICACIDVEN" in the last 168 hours.   No results found for this or any previous visit (from the past 240 hours).    Radiology Studies: No results found.   Scheduled Meds:  baclofen  5 mg Oral BID   bisacodyl  10 mg Oral Once   bisacodyl  10 mg Oral QHS   busPIRone  10 mg Oral TID   diclofenac Sodium  2 g Topical TID   enoxaparin (LOVENOX)  injection  40 mg Subcutaneous Q24H   feeding supplement  237 mL Oral TID BM   folic acid  1 mg Oral Daily   gabapentin  300 mg Oral QID   Gerhardt's butt cream   Topical BID   hydrocortisone  25 mg Rectal BID   hydrOXYzine  25 mg Oral Once   montelukast  10 mg Oral QHS   nystatin   Topical TID   pantoprazole  40 mg Oral Daily   polyethylene glycol  17 g Oral BID   prazosin  1 mg Oral QHS   QUEtiapine  100 mg Oral Daily   QUEtiapine  200 mg Oral QHS   thiamine  100 mg Oral Daily   Or   thiamine (VITAMIN B1) injection  100 mg Intravenous Daily   Continuous Infusions:   LOS: 20 days    Gillis Santa,  MD [facility]   If 7PM-7AM, please contact night-coverage  12/24/2023, 3:02 PM

## 2023-12-24 NOTE — TOC Progression Note (Signed)
 Transition of Care [facility]) - Progression Note    Patient Details  Name: Tami Hopkins MRN: [id number] Date of Birth: 02-01-1961  Transition of [facility]) CM/SW Contact  Marlowe Sax, RN Phone Number: 12/24/2023, 2:02 PM  Clinical Narrative:    Met with DSS worker Noemi Chapel and she reports that she gave the patient 5 pages of low income housing options, she stated that the patient had many reasons why she was not trying to find an apartment, she explained to the patient that she needs [place] ahead and put in applications, the patient has applied at [facility] in [facility]          Expected Discharge Plan and Services                                               Social Determinants of Health (SDOH) Interventions SDOH Screenings   Food Insecurity: Patient Unable To Answer (12/05/2023)  Recent Concern: Food Insecurity - Food Insecurity Present (10/10/2023)  Housing: Patient Unable To Answer (12/05/2023)  Recent Concern: Housing - High Risk (10/09/2023)  Transportation Needs: Patient Unable To Answer (12/05/2023)  Utilities: Patient Unable To Answer (12/05/2023)  Depression (PHQ2-9): Low Risk  (11/21/2023)  Financial Resource Strain: Medium Risk (09/25/2022)   Received from [facility], [facility]  Physical Activity: Insufficiently Active (01/13/2021)   Received from [facility], [facility]  Social Connections: Socially Isolated (01/13/2021)   Received from [facility], [facility]  Stress: Stress Concern Present (01/13/2021)   Received from [facility], [facility]  Tobacco Use: High Risk (12/17/2023)    Readmission Risk Interventions     No data to display

## 2023-12-24 NOTE — Telephone Encounter (Signed)
 Patient needs to speak with a nurse.

## 2023-12-24 NOTE — Progress Notes (Signed)
 Occupational Therapy Treatment Patient Details Name: Tami Hopkins MRN: [id number] DOB: [birth year] Today's Date: 12/24/2023   History of present illness This is a 63 year old woman with a history of hyperlipidemia, COPD, asthma, tobacco use, chronic pain syndrome, anxiety, PTSD, IBS, and GERD who presented to the ED for unresponsiveness after being found down at home, possibly for up to 2 days, requiring intubation and admission to the ICU. Etiology favored to be narcotic overdose or polypharmacy.   OT comments  Tami Hopkins was seen for OT treatment on this date. Upon arrival to room pt in bed, agreeable to limited tx, defers OOB citing recently getting up to bathroom MOD I. Pt currently IND self-feeding at bed level and propping RUE on pillow. Sling provided, requires MOD A to don at bed level, educated on importance of limiting sling use to prevent deconditioning. Reviewed HEP, upgraded theraband to red/green and added shoulder shrugs, scapular retraction/protraction, and forearm pronation/supination with good return demonstration. Pt making good progress toward goals, will continue to follow POC. Discharge recommendation remains appropriate.       If plan is discharge home, recommend the following:  A little help with bathing/dressing/bathroom;A little help with walking and/or transfers   Equipment Recommendations  BSC/3in1    Recommendations for Other Services      Precautions / Restrictions Precautions Precautions: Fall Recall of Precautions/Restrictions: Intact Restrictions Weight Bearing Restrictions Per Provider Order: No       Mobility Bed Mobility Overal bed mobility: Modified Independent             General bed mobility comments: use of bed features for repositioning and rolling in bed    Transfers                   General transfer comment: pt declined         ADL either performed or assessed with clinical judgement   ADL Overall ADL's : Needs  assistance/impaired                                       General ADL Comments: IND self-feeding at bed level. Sling provided, requires MIN A to don at bed level.     Extremity/Trunk Assessment Upper Extremity Assessment Upper Extremity Assessment: Right hand dominant;RUE deficits/detail RUE Deficits / Details: bicep/tricep 2/5, moves through full elbow AROM in gravity eliminated plane. no supination however pronation to/from neutral intact. Achieves functional grip for handwriting with elbow supported on pillow/table.            Vision       Perception     Praxis     Communication Communication Communication: No apparent difficulties   Cognition Arousal: Alert Behavior During Therapy: WFL for tasks assessed/performed Cognition: No family/caregiver present to determine baseline           Executive functioning impairment (select all impairments): Problem solving                   Following commands: Intact Following commands impaired: Follows one step commands with increased time      Cueing      Exercises Other Exercises Other Exercises: reviewed HEP, upgraded theraband to red/green and added shoulder shrugs, scapular retraction/protraction, and forearm pronation/supination.    Shoulder Instructions       General Comments      Pertinent Vitals/ Pain       Pain Assessment  Pain Assessment: Faces Faces Pain Scale: Hurts little more Pain Location: R foot Pain Descriptors / Indicators: Cramping Pain Intervention(s): Limited activity within patient's tolerance, Premedicated before session   Frequency  Min 1X/week        Progress Toward Goals  OT Goals(current goals can now be found in the care plan section)  Progress towards OT goals: Progressing toward goals  Acute Rehab OT Goals Patient Stated Goal: to go home OT Goal Formulation: With patient Time For Goal Achievement: 01/02/24 Potential to Achieve Goals: Good ADL  Goals Pt Will Perform Grooming: with modified independence;sitting Pt Will Perform Lower Body Dressing: with modified independence;sitting/lateral leans Pt Will Transfer to Toilet: with modified independence;ambulating Pt Will Perform Toileting - Clothing Manipulation and hygiene: with modified independence;sit to/from stand;sitting/lateral leans Pt/caregiver will Perform Home Exercise Program: Increased ROM;Increased strength;Right Upper extremity;With written HEP provided  Plan      Co-evaluation                 AM-PAC OT "6 Clicks" Daily Activity     Outcome Measure   Help from another person eating meals?: None Help from another person taking care of personal grooming?: None Help from another person toileting, which includes using toliet, bedpan, or urinal?: A Little Help from another person bathing (including washing, rinsing, drying)?: A Little Help from another person to put on and taking off regular upper body clothing?: A Little Help from another person to put on and taking off regular lower body clothing?: A Little 6 Click Score: 20    End of Session    OT Visit Diagnosis: Other abnormalities of gait and mobility (R26.89);Muscle weakness (generalized) (M62.81)   Activity Tolerance Patient tolerated treatment well   Patient Left in bed;with call bell/phone within reach   Nurse Communication          Time: 0950-1010 OT Time Calculation (min): 20 min  Charges: OT General Charges $OT Visit: 1 Visit OT Treatments $Self Care/Home Management : 8-22 mins  Kathie Dike, M.S. OTR/L  12/24/23, 1:34 PM  ascom [phone]

## 2023-12-25 ENCOUNTER — Telehealth: Payer: Self-pay | Admitting: Anesthesiology

## 2023-12-25 ENCOUNTER — Other Ambulatory Visit: Payer: Self-pay

## 2023-12-25 DIAGNOSIS — J9601 Acute respiratory failure with hypoxia: Secondary | ICD-10-CM | POA: Diagnosis not present

## 2023-12-25 MED ORDER — MIDODRINE HCL 5 MG PO TABS
5.0000 mg | ORAL_TABLET | Freq: Three times a day (TID) | ORAL | Status: DC
  Administered 2023-12-25: 5 mg via ORAL
  Filled 2023-12-25 (×4): qty 1

## 2023-12-25 MED ORDER — HYDROCORTISONE ACETATE 25 MG RE SUPP
25.0000 mg | Freq: Two times a day (BID) | RECTAL | 1 refills | Status: AC | PRN
  Filled 2023-12-25: qty 12, 6d supply, fill #0

## 2023-12-25 NOTE — Telephone Encounter (Signed)
 Called and spoke with the patient and she wants to know why her opioid  levels were so high in her blood work. Informed her that she would need to speak to the doctor about her lab results.  She is currently in the hospital and I instructed her to call and make an appointment when she was discharged.

## 2023-12-25 NOTE — Progress Notes (Signed)
 PROGRESS NOTE    Tami Hopkins  ZOX:[id number] DOB: 1961-10-13 DOA: 12/04/2023 PCP: Ardyth Man, PA-C    Brief Narrative:   Tami Hopkins is a 63 y.o. female with a PMH significant for HLD, COPD, Asthma, Tobacco use, Chronic pain syndrome, Anxiety, PTSD, IBS, and GERD who presents to the ED for altered mental status.  Per chart review, family had not heard from the patient for the past 2 days and called for a wellness check.  Patient subsequently found unresponsive sitting on the couch, roommate stated that they thought patient had been sleeping on the couch for the past few days.  Of note, patient was admitted to the hospital a couple of months ago for altered mental status due to suspected polypharmacy use.  Upon EMS arrival patient was given 2 mg of intranasal Narcan with no effect, subsequently given 2 mg IV Narcan with slight respiratory improvement, yet remained unresponsive.  Upon arrival to the ER, BP 94/78, afebrile, 100% on room air, she was noted to have vomit in her airway during suctioning. She was intubated for airway protection. Labs revealed K 6.0, Creat 3.72, BUN 62, AST 162, ALT 85, CK 4155, Troponin 91 and 74, lactic acid 1.6, WBC 19.0, Drug screen + benzodiazepine, opiate, tricyclics. She was given a total of 3 liters of IVF, started on broad spectrum antibiotics, cultures sent, and started on Norepinephrine infusion. CT c-spine without noted fracture, CT head without acute pathology, CT A/P/T bilateral atelectasis, stable non obstructing right renal calculi, hepatic steatosis, aortic atherosclerosis, no acute intra abdominal process.     PCCM was consulted for Medical Management in this critically ill patient in septic shock and respiratory failure suspected due to polysubstance abuse and aspiration Pneumonia   2/12: Admitted with AMS due to polysubstance abuse. Septic shock, levophed, cultures sent, intubated  02/13: Brainstem reflexes improved today with regain of  cough, gag, pupils reactive.  02/14: Improved mental status but not yet following commands.  02/16: Extubated to nasal cannula.  With right sided weakness in upper and lower extremity and facial twitches. Febrile to 101. Neurology is consulted.  2/18: [facility] assumed care 2/20: Mental status waxes and wanes.  Delirium noted last night 2/25: Mental status improved.  Motivated and working with therapy   Assessment & Plan:   Principal Problem:   Acute hypoxic respiratory failure (HCC) Active Problems:   Rhabdomyolysis   Aspiration pneumonia (HCC)   AKI (acute kidney injury) (HCC)   Chronic obstructive pulmonary disease (HCC)   Chronic pain syndrome   Post-traumatic stress disorder   Gastroesophageal reflux disease with esophagitis   High cholesterol   Moderate persistent asthma without complication   Tobacco use disorder   Acute respiratory failure (HCC)  # Unintentional opioid overdose # Opioid use disorder # Chronic pain Denies intentional overdose, denies SI Plan: - will need close outpatient f/u with pain medicine and plan to wean off opioids vs suboxone/subutex or other OUD treatment - continue home prn oxy for now - cont home gabapentin, baclofen (baclofen at reduced dose) 2/28 c/o pain in the R foot sole and top of toes, sensitive to touch.  Possible neuropathic pain.  Patient is only on gabapentin.  Ibuprofen 400 mg 3 times daily x 2 doses order placed Patient does not want Voltaren gel 3/1 patient requested to add gabapentin 300 mg as needed twice daily dosing and Voltaren gel for right foot pain.  # Acute mebabolic encephalopathy Noted to be more confused on 2/20, wandering,  removing IV access Neuro workup reassuring Mental status improving Appears to be at baseline Plan: Continue home psych regimen Educate patient on importance of adherence to prescribed regimen Delirium precautions, frequent reorienting measures Mentation has been improving   #  GAD - cont klonopin - cont home buspar   # Hypokalemia - replete and monitor as needed   # AKI Prerenal 2/2 overdose, resolved   # Loose stool Resolved   # Aspiration pneumonia # Acute hypoxic respiratory failure Extubated and now breathing comfortably on room air, most recent cxr no infiltrates. Finished a course of IV abx in the ICU. MRI nothing acute, EEG no seizure -Patient on room air.  Continue to monitor   # Debility PT advising SNF, TOC is aware and pursuing a bed No bed offers yet.  Patient was educated extensively on the importance of regular working with physical therapy.  She agreed and worked with therapy on 2/21.  I recommended daily interactions with therapy, effective recovery mobilization.  Patient has been willingness to work with therapy services  # Facial twitching # Right upper extremity weakness # Myositis/rhabdomyolysis Patient with severe and persistent right upper extremity weakness.  MRI brain and cervical spine overall unrevealing.  Patient does have diffuse soft tissue edema on right shoulder concerning for myositis.  CK has normalized. Plan: Continue to suspect myositis secondary to rhabdomyolysis.  Upper extremity strength is improving on a daily basis.  Patient is motivated to improve and recover.  She has been actively working with therapy and mobility services on a daily basis.  She will benefit from skilled nursing facility placement. 3/3 patient is requesting a sling for the right arm and heating pad for the right foot pain  Hemorrhoids 3/4 today patient was requesting for hemorrhoidal suppositories which has been ordered    DVT prophylaxis: SQ lovenox Code Status: FULL Family Communication:Daughter via phone 2/19.  Offered to call 2/25 however patient declined Disposition Plan: Status is: Inpatient Remains inpatient appropriate because: Weakness decreased from baseline.  Resolving encephalopathy.  Will need SNF.  She is medically ready for  discharge at this time 3/4 as per [facility] SNF will not take her as patient has no place to go afterwards.  Patient cannot go back to her partner as she is scared, so she is homeless.  DSS worker provided low income housing options and patient has applied [employer] in [place]. Stable to discharge once patient finds the apartment. Reconciled medications and sent scripts to [facility] pharmacy   Level of care: Med-Surg  Consultants:  PCCM Neurology  Procedures:  S/p extubation on 2/16  Antimicrobials: None    Subjective: No significant overnight events, patient still has pain in the right lower extremity, requesting to increase gabapentin as needed dose but she only used 1 time and it is therefore twice daily so advised to continue same dose. Patient is using hemorrhoidal suppository, no any other complaints.  Patient is still working on house arrangement.  Most likely it will be done in 1 to 2 days.   Objective: Vitals:   12/24/23 0754 12/24/23 1554 12/24/23 2024 12/25/23 0731  BP: 102/63 106/74 109/81 96/63  Pulse: 84 87 88 100  Resp: 17 16 18 16   Temp: 97.9 F (36.6 C) 98.1 F (36.7 C) 98.2 F (36.8 C) 98.6 F (37 C)  TempSrc:   Oral   SpO2: 97% 96% 95% 93%  Weight:      Height:       No intake or output data  in the 24 hours ending 12/25/23 1229    Filed Weights   12/19/23 0500 12/22/23 0500 12/23/23 0221  Weight: 80.2 kg 82.5 kg 82 kg    Examination:  General exam: No acute distress.  Fatigued but in good spirits Respiratory system: Lungs clear, normal WOB, RA Cardiovascular system: S1S2, RRR, no murmur Gastrointestinal system: Soft, NT/ND, normal bowel sounds Central nervous system: Alert and oriented x 3.  No focal deficits Extremities: Right upper extremity weak, improved from prior.  Gait intact Skin: No rashes, lesions or ulcers Psychiatry: Judgement and insight appear impaired. Mood & affect flattened/confused.     Data Reviewed: I have  personally reviewed following labs and imaging studies  CBC: Recent Labs  Lab 12/19/23 0533 12/23/23 0823  WBC 6.9 5.8  HGB 11.8* 12.3  HCT 35.4* 37.3  MCV 92.4 94.2  PLT 370 316    Basic Metabolic Panel: Recent Labs  Lab 12/19/23 0533 12/23/23 0823  NA 135 138  K 3.3* 3.9  CL 104 104  CO2 22 26  GLUCOSE 132* 132*  BUN 15 22  CREATININE 0.59 0.67  CALCIUM 9.1 9.5  MG 2.0 2.0  PHOS 3.8 3.7   GFR: Estimated Creatinine Clearance: 79.7 mL/min (by C-G formula based on SCr of 0.67 mg/dL). Liver Function Tests: No results for input(s): "AST", "ALT", "ALKPHOS", "BILITOT", "PROT", "ALBUMIN" in the last 168 hours.  No results for input(s): "LIPASE", "AMYLASE" in the last 168 hours. No results for input(s): "AMMONIA" in the last 168 hours.  Coagulation Profile: No results for input(s): "INR", "PROTIME" in the last 168 hours.  Cardiac Enzymes: No results for input(s): "CKTOTAL", "CKMB", "CKMBINDEX", "TROPONINI" in the last 168 hours.  BNP (last 3 results) No results for input(s): "PROBNP" in the last 8760 hours. HbA1C: No results for input(s): "HGBA1C" in the last 72 hours. CBG: No results for input(s): "GLUCAP" in the last 168 hours.  Lipid Profile: No results for input(s): "CHOL", "HDL", "LDLCALC", "TRIG", "CHOLHDL", "LDLDIRECT" in the last 72 hours. Thyroid Function Tests: No results for input(s): "TSH", "T4TOTAL", "FREET4", "T3FREE", "THYROIDAB" in the last 72 hours. Anemia Panel: No results for input(s): "VITAMINB12", "FOLATE", "FERRITIN", "TIBC", "IRON", "RETICCTPCT" in the last 72 hours.  Sepsis Labs: No results for input(s): "PROCALCITON", "LATICACIDVEN" in the last 168 hours.   No results found for this or any previous visit (from the past 240 hours).    Radiology Studies: No results found.   Scheduled Meds:  baclofen  5 mg Oral BID   bisacodyl  10 mg Oral Once   bisacodyl  10 mg Oral QHS   busPIRone  10 mg Oral TID   diclofenac Sodium  2 g  Topical TID   enoxaparin (LOVENOX) injection  40 mg Subcutaneous Q24H   feeding supplement  237 mL Oral TID BM   folic acid  1 mg Oral Daily   gabapentin  300 mg Oral QID   Gerhardt's butt cream   Topical BID   hydrocortisone  25 mg Rectal BID   hydrOXYzine  25 mg Oral Once   midodrine  5 mg Oral TID WC   montelukast  10 mg Oral QHS   nystatin   Topical TID   pantoprazole  40 mg Oral Daily   polyethylene glycol  17 g Oral BID   prazosin  1 mg Oral QHS   QUEtiapine  100 mg Oral Daily   QUEtiapine  200 mg Oral QHS   thiamine  100 mg Oral Daily   Or  thiamine (VITAMIN B1) injection  100 mg Intravenous Daily   Continuous Infusions:   LOS: 21 days    Gillis Santa, MD [facility]   If 7PM-7AM, please contact night-coverage  12/25/2023, 12:29 PM

## 2023-12-25 NOTE — Telephone Encounter (Signed)
 Attempted to speak with the patient and

## 2023-12-25 NOTE — Plan of Care (Signed)

## 2023-12-25 NOTE — Telephone Encounter (Signed)
 Patient is asking why her blood work would show up with high levels of blood work when she only takes as prescribed. She is still in the hospital. Please call patient

## 2023-12-25 NOTE — Progress Notes (Signed)
 Occupational Therapy Treatment Patient Details Name: Tami Hopkins MRN: [id number] DOB: 1961/04/24 Today's Date: 12/25/2023   History of present illness This is a 63 year old woman with a history of hyperlipidemia, COPD, asthma, tobacco use, chronic pain syndrome, anxiety, PTSD, IBS, and GERD who presented to the ED for unresponsiveness after being found down at home, possibly for up to 2 days, requiring intubation and admission to the ICU. Etiology favored to be narcotic overdose or polypharmacy.   OT comments  Pt seen for OT treatment on this date. Upon arrival to room pt lying supine in bed, agreeable to tx. Pt requires MODI for bed mobility; supine>sitting EOB. Pt educated on donning/doffing RUE sling while seated on EOB. Pt required MIN A for positioning of sling, discussed with pt the benefits of continued use. Pt demonstrated good carryover of instruction. Pt demonstrated bed level HEP UE exercises that were previously given with theraband. Pt was presenting emotionally liable throughout session, often tearful discussing her current situation. Pt making good progress toward goals, will continue to follow POC. Discharge recommendation remains appropriate. OT will follow acutely.       If plan is discharge home, recommend the following:  A little help with bathing/dressing/bathroom;A little help with walking and/or transfers   Equipment Recommendations  BSC/3in1    Recommendations for Other Services      Precautions / Restrictions Precautions Precautions: Fall Recall of Precautions/Restrictions: Intact       Mobility Bed Mobility Overal bed mobility: Modified Independent             General bed mobility comments: Effortfull due to pain, no physical assist required    Transfers                   General transfer comment: NT pt refused OOB mobility on this date, state she completed everything this AM.     Balance Overall balance assessment: Needs  assistance Sitting-balance support: Feet supported Sitting balance-Leahy Scale: Good Sitting balance - Comments: Often needs to lean back to relieve painful RLE                                   ADL either performed or assessed with clinical judgement   ADL Overall ADL's : Needs assistance/impaired Eating/Feeding: Sitting;Set up Eating/Feeding Details (indicate cue type and reason): Pt able to grip cup, only able to support cup in upright position when elbow supported with pillows.             Upper Body Dressing : Minimal assistance (Donning sling at EOB)   Lower Body Dressing: Moderate assistance (Donning/doffing R sock - seated on EOB, MOD A due to pain and minimal functional use of her dominate arm.)               Functional mobility during ADLs:  (Pt refused OOB mobility dispite verbal encouragement.) General ADL Comments: Pt completed bed level ADL, donning sling on EOB with MIN A for positioning.    Extremity/Trunk Assessment              Vision       Perception     Praxis     Communication     Cognition Arousal: Alert Behavior During Therapy: Lability (Pt often tearful, during session talking about situation.) Cognition: No family/caregiver present to determine baseline             OT - Cognition Comments:  (  Pt not willing to participate to complete OOB mobility on this date, dispite verbal encouragement.)                 Following commands: Intact        Cueing      Exercises Exercises: Other exercises Other Exercises Other Exercises: Edu: Reviewed UE exercises at bed level and adjusted her theraband, sling positioning and donning sequence    Shoulder Instructions       General Comments      Pertinent Vitals/ Pain       Pain Assessment Pain Assessment: 0-10 Pain Score: 8  Pain Location: R foot Pain Descriptors / Indicators: Pins and needles, Shooting Pain Intervention(s): Monitored during session, Limited  activity within patient's tolerance, Repositioned                                                          Frequency  Min 1X/week        Progress Toward Goals  OT Goals(current goals can now be found in the care plan section)  Progress towards OT goals: Progressing toward goals  Acute Rehab OT Goals Patient Stated Goal: get home to her dog OT Goal Formulation: With patient Time For Goal Achievement: 01/02/24 Potential to Achieve Goals: Good  Plan      Co-evaluation                 AM-PAC OT "6 Clicks" Daily Activity     Outcome Measure   Help from another person eating meals?: None Help from another person taking care of personal grooming?: None Help from another person toileting, which includes using toliet, bedpan, or urinal?: A Little Help from another person bathing (including washing, rinsing, drying)?: A Little Help from another person to put on and taking off regular upper body clothing?: A Little Help from another person to put on and taking off regular lower body clothing?: A Little 6 Click Score: 20    End of Session    OT Visit Diagnosis: Other abnormalities of gait and mobility (R26.89);Muscle weakness (generalized) (M62.81)   Activity Tolerance Patient tolerated treatment well   Patient Left in bed;with call bell/phone within reach   Nurse Communication Mobility status        Time: 1343-1420 OT Time Calculation (min): 37 min  Charges: OT General Charges $OT Visit: 1 Visit OT Treatments $Self Care/Home Management : 8-22 mins $Therapeutic Exercise: 8-22 mins  Glenard Haring M.S. OTR/L  12/25/23, 2:54 PM

## 2023-12-25 NOTE — Progress Notes (Signed)
 Mobility Specialist - Progress Note   12/25/23 1144  Mobility  Activity Ambulated with assistance in hallway  Level of Assistance Standby assist, set-up cues, supervision of patient - no hands on  Assistive Device Front wheel walker  Distance Ambulated (ft) 350 ft  Mobility visit 1 Mobility     Pt lying in bed upon arrival, utilizing RA. Pt agreeable to activity. STS and ambulation with minG. Pt requests stair-training. Ambulated to gym, "up with the good-down with bad" with good carryover. Pt returned to bed with needs in reach.    Tami Hopkins Mobility Specialist 12/25/23, 11:46 AM

## 2023-12-26 DIAGNOSIS — J9601 Acute respiratory failure with hypoxia: Secondary | ICD-10-CM | POA: Diagnosis not present

## 2023-12-26 NOTE — TOC Progression Note (Signed)
 Transition of Care [facility]) - Progression Note    Patient Details  Name: Tami Hopkins MRN: [id number] Date of Birth: 1960/12/09  [facility]) CM/SW Contact  Marlowe Sax, RN Phone Number: 12/26/2023, 10:55 AM  Clinical Narrative:     The patient is requesting RW , she received a Rolling walker and 3 in 1 in Dec 2024 and will not be able to get another thru Insurance, she will have to pay out of pocket, due top having Insurance we will not be able to provide thru the [employer]                                               Social Determinants of Health (SDOH) Interventions SDOH Screenings   Food Insecurity: Patient Unable To Answer (12/05/2023)  Recent Concern: Food Insecurity - Food Insecurity Present (10/10/2023)  Housing: Patient Unable To Answer (12/05/2023)  Recent Concern: Housing - High Risk (10/09/2023)  Transportation Needs: Patient Unable To Answer (12/05/2023)  Utilities: Patient Unable To Answer (12/05/2023)  Depression (PHQ2-9): Low Risk  (11/21/2023)  Financial Resource Strain: Medium Risk (09/25/2022)   Received from [facility], [facility]  Physical Activity: Insufficiently Active (01/13/2021)   Received from [facility], [facility]  Social Connections: Socially Isolated (01/13/2021)   Received from [facility], [facility]  Stress: Stress Concern Present (01/13/2021)   Received from [facility], [facility]  Tobacco Use: High Risk (12/17/2023)    Readmission Risk Interventions     No data to display

## 2023-12-26 NOTE — Progress Notes (Signed)
 Physical Therapy Treatment Patient Details Name: Tami Hopkins MRN: [id number] DOB: 12-Nov-1960 Today's Date: 12/26/2023   History of Present Illness This is a 63 year old woman with a history of hyperlipidemia, COPD, asthma, tobacco use, chronic pain syndrome, anxiety, PTSD, IBS, and GERD who presented to the ED for unresponsiveness after being found down at home, possibly for up to 2 days, requiring intubation and admission to the ICU. Etiology favored to be narcotic overdose or polypharmacy.    PT Comments  Pt resting in bed upon PT arrival; pt agreeable to therapy; pt reporting 10/10 chronic R foot pain during session (nurse notified; pt with recent pain medication) but pt appearing very motivated to walk.  During session pt modified independent with bed mobility; SBA with transfer from bed; SBA with ambulation in hallway using RW; and CGA navigating steps with use of railings.  No loss of balance noted during sessions activities.  Will continue to focus on strengthening, balance, and progressing functional mobility during hospitalization.    If plan is discharge home, recommend the following: A little help with walking and/or transfers;A little help with bathing/dressing/bathroom;Assistance with cooking/housework;Assist for transportation;Help with stairs or ramp for entrance;Direct supervision/assist for financial management   Can travel by private vehicle     Yes  Equipment Recommendations  Rollator (4 wheels);Rolling walker (2 wheels)    Recommendations for Other Services       Precautions / Restrictions Precautions Precautions: Fall Recall of Precautions/Restrictions: Intact Restrictions Weight Bearing Restrictions Per Provider Order: No     Mobility  Bed Mobility Overal bed mobility: Modified Independent Bed Mobility: Supine to Sit, Sit to Supine     Supine to sit: Modified independent (Device/Increase time), HOB elevated Sit to supine: Modified independent  (Device/Increase time), HOB elevated   General bed mobility comments: mild increased effort to perform on own    Transfers Overall transfer level: Needs assistance Equipment used: Rolling walker (2 wheels) Transfers: Sit to/from Stand Sit to Stand: Supervision           General transfer comment: steady transfer from bed    Ambulation/Gait Ambulation/Gait assistance: Supervision Gait Distance (Feet): 350 Feet Assistive device: Rolling walker (2 wheels) Gait Pattern/deviations: Step-through pattern Gait velocity: decreased     General Gait Details: antalgic; decreased stance time R LE; steady with RW use   Stairs Stairs: Yes Stairs assistance: Contact guard assist Stair Management: Two rails, Alternating pattern, Step to pattern, Forwards Number of Stairs:  (4 steps x2 trials (short walk between trials)) General stair comments: steady stairs navigation; alternating pattern ascending; step to pattern descending   Wheelchair Mobility     Tilt Bed    Modified Rankin (Stroke Patients Only)       Balance Overall balance assessment: Needs assistance Sitting-balance support: No upper extremity supported, Feet supported Sitting balance-Leahy Scale: Good Sitting balance - Comments: steady reaching within BOS with L UE   Standing balance support: Bilateral upper extremity supported, During functional activity, Reliant on assistive device for balance Standing balance-Leahy Scale: Good Standing balance comment: steady ambulating with RW use                            Communication Communication Communication: No apparent difficulties  Cognition Arousal: Alert Behavior During Therapy: WFL for tasks assessed/performed   PT - Cognitive impairments: No apparent impairments  PT - Cognition Comments: Pt is A and O x 4 Following commands: Intact      Cueing Cueing Techniques: Verbal cues  Exercises      General Comments   Pt agreeable to PT session.      Pertinent Vitals/Pain Pain Assessment Pain Assessment: 0-10 Pain Score: 10-Worst pain ever Pain Location: R foot Pain Descriptors / Indicators: Stabbing, Sharp Pain Intervention(s): Limited activity within patient's tolerance, Monitored during session, Premedicated before session, Repositioned, Other (comment) (Nurse notified who reports recent pain medication (pt also reporting recent pain medication)) Vitals (HR and SpO2 on room air) stable during treatment session.    Home Living                          Prior Function            PT Goals (current goals can now be found in the care plan section) Acute Rehab PT Goals Patient Stated Goal: "Get better so I can eventually go home." PT Goal Formulation: With patient Time For Goal Achievement: 01/09/24 Potential to Achieve Goals: Good Progress towards PT goals: Progressing toward goals    Frequency    Min 2X/week      PT Plan      Co-evaluation              AM-PAC PT "6 Clicks" Mobility   Outcome Measure  Help needed turning from your back to your side while in a flat bed without using bedrails?: None Help needed moving from lying on your back to sitting on the side of a flat bed without using bedrails?: None Help needed moving to and from a bed to a chair (including a wheelchair)?: A Little Help needed standing up from a chair using your arms (e.g., wheelchair or bedside chair)?: A Little Help needed to walk in hospital room?: A Little Help needed climbing 3-5 steps with a railing? : A Little 6 Click Score: 20    End of Session Equipment Utilized During Treatment: Gait belt Activity Tolerance: Patient tolerated treatment well Patient left: in bed;with call bell/phone within reach Nurse Communication: Mobility status;Other (comment) (Pt's pain status) PT Visit Diagnosis: Other abnormalities of gait and mobility (R26.89);Difficulty in walking, not elsewhere classified  (R26.2);Muscle weakness (generalized) (M62.81)     Time: 8295-6213 PT Time Calculation (min) (ACUTE ONLY): 16 min  Charges:    $Gait Training: 8-22 mins PT General Charges $$ ACUTE PT VISIT: 1 Visit                     Hendricks Limes, PT 12/26/23, 4:11 PM

## 2023-12-26 NOTE — Progress Notes (Signed)
 PROGRESS NOTE    Tami Hopkins  WJX:[id number] DOB: 1961-02-24 DOA: 12/04/2023 PCP: Ardyth Man, PA-C    Brief Narrative:   Tami Hopkins is a 63 y.o. female with a PMH significant for HLD, COPD, Asthma, Tobacco use, Chronic pain syndrome, Anxiety, PTSD, IBS, and GERD who presents to the ED for altered mental status.  Per chart review, family had not heard from the patient for the past 2 days and called for a wellness check.  Patient subsequently found unresponsive sitting on the couch, roommate stated that they thought patient had been sleeping on the couch for the past few days.  Of note, patient was admitted to the hospital a couple of months ago for altered mental status due to suspected polypharmacy use.  Upon EMS arrival patient was given 2 mg of intranasal Narcan with no effect, subsequently given 2 mg IV Narcan with slight respiratory improvement, yet remained unresponsive.  Upon arrival to the ER, BP 94/78, afebrile, 100% on room air, she was noted to have vomit in her airway during suctioning. She was intubated for airway protection. Labs revealed K 6.0, Creat 3.72, BUN 62, AST 162, ALT 85, CK 4155, Troponin 91 and 74, lactic acid 1.6, WBC 19.0, Drug screen + benzodiazepine, opiate, tricyclics. She was given a total of 3 liters of IVF, started on broad spectrum antibiotics, cultures sent, and started on Norepinephrine infusion. CT c-spine without noted fracture, CT head without acute pathology, CT A/P/T bilateral atelectasis, stable non obstructing right renal calculi, hepatic steatosis, aortic atherosclerosis, no acute intra abdominal process.     PCCM was consulted for Medical Management in this critically ill patient in septic shock and respiratory failure suspected due to polysubstance abuse and aspiration Pneumonia   2/12: Admitted with AMS due to polysubstance abuse. Septic shock, levophed, cultures sent, intubated  02/13: Brainstem reflexes improved today with regain of  cough, gag, pupils reactive.  02/14: Improved mental status but not yet following commands.  02/16: Extubated to nasal cannula.  With right sided weakness in upper and lower extremity and facial twitches. Febrile to 101. Neurology is consulted.  2/18: [facility] assumed care 2/20: Mental status waxes and wanes.  Delirium noted last night 2/25: Mental status improved.  Motivated and working with therapy   Assessment & Plan:   Principal Problem:   Acute hypoxic respiratory failure (HCC) Active Problems:   Rhabdomyolysis   Aspiration pneumonia (HCC)   AKI (acute kidney injury) (HCC)   Chronic obstructive pulmonary disease (HCC)   Chronic pain syndrome   Post-traumatic stress disorder   Gastroesophageal reflux disease with esophagitis   High cholesterol   Moderate persistent asthma without complication   Tobacco use disorder   Acute respiratory failure (HCC)  # Unintentional opioid overdose # Opioid use disorder # Chronic pain Denies intentional overdose, denies SI Plan: - will need close outpatient f/u with pain medicine and plan to wean off opioids vs suboxone/subutex or other OUD treatment - continue home prn oxy for now - cont home gabapentin, baclofen (baclofen at reduced dose) 2/28 c/o pain in the R foot sole and top of toes, sensitive to touch.  Possible neuropathic pain.  Patient is only on gabapentin.  Ibuprofen 400 mg 3 times daily x 2 doses order placed Patient does not want Voltaren gel 3/1 patient requested to add gabapentin 300 mg as needed twice daily dosing and Voltaren gel for right foot pain.  # Acute mebabolic encephalopathy Noted to be more confused on 2/20, wandering,  removing IV access Neuro workup reassuring Mental status improving Appears to be at baseline Plan: Continue home psych regimen Educate patient on importance of adherence to prescribed regimen Delirium precautions, frequent reorienting measures Mentation has been improving   #  GAD - cont klonopin - cont home buspar   # Hypokalemia - replete and monitor as needed   # AKI Prerenal 2/2 overdose, resolved   # Loose stool Resolved   # Aspiration pneumonia # Acute hypoxic respiratory failure Extubated and now breathing comfortably on room air, most recent cxr no infiltrates. Finished a course of IV abx in the ICU. MRI nothing acute, EEG no seizure -Patient on room air.  Continue to monitor   # Debility PT advising SNF, TOC is aware and pursuing a bed No bed offers yet.  Patient was educated extensively on the importance of regular working with physical therapy.  She agreed and worked with therapy on 2/21.  I recommended daily interactions with therapy, effective recovery mobilization.  Patient has been willingness to work with therapy services  # Facial twitching # Right upper extremity weakness # Myositis/rhabdomyolysis Patient with severe and persistent right upper extremity weakness.  MRI brain and cervical spine overall unrevealing.  Patient does have diffuse soft tissue edema on right shoulder concerning for myositis.  CK has normalized. Plan: Continue to suspect myositis secondary to rhabdomyolysis.  Upper extremity strength is improving on a daily basis.  Patient is motivated to improve and recover.  She has been actively working with therapy and mobility services on a daily basis.  She will benefit from skilled nursing facility placement. 3/3 patient is requesting a sling for the right arm and heating pad for the right foot pain  Hemorrhoids 3/4 today patient was requesting for hemorrhoidal suppositories which has been ordered    DVT prophylaxis: SQ lovenox Code Status: FULL Family Communication:Daughter via phone 2/19.  Offered to call 2/25 however patient declined Disposition Plan: Status is: Inpatient Remains inpatient appropriate because: Weakness decreased from baseline.  Resolving encephalopathy.  Will need SNF.  She is medically ready for  discharge at this time 3/4 as per [facility] SNF will not take her as patient has no place to go afterwards.  Patient cannot go back to her partner as she is scared, so she is homeless.  DSS worker provided low income housing options and patient has applied [employer] in [place]. Stable to discharge once patient finds the apartment. Reconciled medications and sent scripts to [facility] pharmacy   Level of care: Med-Surg  Consultants:  PCCM Neurology  Procedures:  S/p extubation on 2/16  Antimicrobials: None    Subjective: No significant overnight events, patient still has pain in the right lower extremity, requesting Tylenol along with oxycodone. Patient is on making phone calls and trying to make housing arrangement, has not finalized yet. Plan is to discharge her home as soon she will make the arrangements.   Objective: Vitals:   12/25/23 2155 12/26/23 0500 12/26/23 0808 12/26/23 1529  BP: 122/86  96/75 112/83  Pulse: 86  88 95  Resp: 18  16 16   Temp:   98.1 F (36.7 C) 98.2 F (36.8 C)  TempSrc:      SpO2: 96%  94% 97%  Weight:  84.5 kg    Height:        Intake/Output Summary (Last 24 hours) at 12/26/2023 1642 Last data filed at 12/26/2023 1100 Gross per 24 hour  Intake 600 ml  Output --  Net 600 ml  Filed Weights   12/22/23 0500 12/23/23 0221 12/26/23 0500  Weight: 82.5 kg 82 kg 84.5 kg    Examination:  General exam: No acute distress.  Fatigued but in good spirits Respiratory system: Lungs clear, normal WOB, RA Cardiovascular system: S1S2, RRR, no murmur Gastrointestinal system: Soft, NT/ND, normal bowel sounds Central nervous system: Alert and oriented x 3.  No focal deficits Extremities: Right upper extremity weak, improved from prior.  Gait intact Skin: No rashes, lesions or ulcers Psychiatry: Judgement and insight appear impaired. Mood & affect flattened/confused.     Data Reviewed: I have personally reviewed following labs and imaging  studies  CBC: Recent Labs  Lab 12/23/23 0823  WBC 5.8  HGB 12.3  HCT 37.3  MCV 94.2  PLT 316    Basic Metabolic Panel: Recent Labs  Lab 12/23/23 0823  NA 138  K 3.9  CL 104  CO2 26  GLUCOSE 132*  BUN 22  CREATININE 0.67  CALCIUM 9.5  MG 2.0  PHOS 3.7   GFR: Estimated Creatinine Clearance: 80.9 mL/min (by C-G formula based on SCr of 0.67 mg/dL). Liver Function Tests: No results for input(s): "AST", "ALT", "ALKPHOS", "BILITOT", "PROT", "ALBUMIN" in the last 168 hours.  No results for input(s): "LIPASE", "AMYLASE" in the last 168 hours. No results for input(s): "AMMONIA" in the last 168 hours.  Coagulation Profile: No results for input(s): "INR", "PROTIME" in the last 168 hours.  Cardiac Enzymes: No results for input(s): "CKTOTAL", "CKMB", "CKMBINDEX", "TROPONINI" in the last 168 hours.  BNP (last 3 results) No results for input(s): "PROBNP" in the last 8760 hours. HbA1C: No results for input(s): "HGBA1C" in the last 72 hours. CBG: No results for input(s): "GLUCAP" in the last 168 hours.  Lipid Profile: No results for input(s): "CHOL", "HDL", "LDLCALC", "TRIG", "CHOLHDL", "LDLDIRECT" in the last 72 hours. Thyroid Function Tests: No results for input(s): "TSH", "T4TOTAL", "FREET4", "T3FREE", "THYROIDAB" in the last 72 hours. Anemia Panel: No results for input(s): "VITAMINB12", "FOLATE", "FERRITIN", "TIBC", "IRON", "RETICCTPCT" in the last 72 hours.  Sepsis Labs: No results for input(s): "PROCALCITON", "LATICACIDVEN" in the last 168 hours.   No results found for this or any previous visit (from the past 240 hours).    Radiology Studies: No results found.   Scheduled Meds:  baclofen  5 mg Oral BID   bisacodyl  10 mg Oral Once   bisacodyl  10 mg Oral QHS   busPIRone  10 mg Oral TID   diclofenac Sodium  2 g Topical TID   enoxaparin (LOVENOX) injection  40 mg Subcutaneous Q24H   feeding supplement  237 mL Oral TID BM   folic acid  1 mg Oral Daily    gabapentin  300 mg Oral QID   Gerhardt's butt cream   Topical BID   hydrOXYzine  25 mg Oral Once   montelukast  10 mg Oral QHS   nystatin   Topical TID   pantoprazole  40 mg Oral Daily   polyethylene glycol  17 g Oral BID   prazosin  1 mg Oral QHS   QUEtiapine  100 mg Oral Daily   QUEtiapine  200 mg Oral QHS   thiamine  100 mg Oral Daily   Or   thiamine (VITAMIN B1) injection  100 mg Intravenous Daily   Continuous Infusions:   LOS: 22 days    Gillis Santa, MD [facility]   If 7PM-7AM, please contact night-coverage  12/26/2023, 4:42 PM

## 2023-12-26 NOTE — Plan of Care (Signed)

## 2023-12-27 DIAGNOSIS — J9601 Acute respiratory failure with hypoxia: Secondary | ICD-10-CM | POA: Diagnosis not present

## 2023-12-27 NOTE — Progress Notes (Signed)
 Mobility Specialist - Progress Note   12/27/23 1639  Mobility  Activity Refused mobility     2nd attempt this date. Pt declined mobility; reporting sharp pain in R foot at rest and with activity. Will attempt another date/time.    Filiberto Pinks Mobility Specialist 12/27/23, 4:40 PM

## 2023-12-27 NOTE — Plan of Care (Signed)
   Problem: Health Behavior/Discharge Planning: Goal: Ability to manage health-related needs will improve Outcome: Progressing   Problem: Clinical Measurements: Goal: Ability to maintain clinical measurements within normal limits will improve Outcome: Progressing Goal: Will remain free from infection Outcome: Progressing

## 2023-12-27 NOTE — Plan of Care (Signed)
  Problem: Education: Goal: Knowledge of General Education information will improve Description: Including pain rating scale, medication(s)/side effects and non-pharmacologic comfort measures Outcome: Progressing   Problem: Health Behavior/Discharge Planning: Goal: Ability to manage health-related needs will improve Outcome: Progressing   Problem: Nutrition: Goal: Adequate nutrition will be maintained Outcome: Progressing   Problem: Elimination: Goal: Will not experience complications related to bowel motility Outcome: Progressing   Problem: Safety: Goal: Ability to remain free from injury will improve Outcome: Progressing   Problem: Skin Integrity: Goal: Risk for impaired skin integrity will decrease Outcome: Progressing   

## 2023-12-27 NOTE — Plan of Care (Signed)
   Problem: Activity: Goal: Risk for activity intolerance will decrease Outcome: Progressing   Problem: Nutrition: Goal: Adequate nutrition will be maintained Outcome: Progressing   Problem: Coping: Goal: Level of anxiety will decrease Outcome: Progressing

## 2023-12-27 NOTE — Progress Notes (Signed)
 PROGRESS NOTE    Tami Hopkins  ZOX:[id number] DOB: 12-08-1960 DOA: 12/04/2023 PCP: Ardyth Man, PA-C    Brief Narrative:   Tami Hopkins is a 63 y.o. female with a PMH significant for HLD, COPD, Asthma, Tobacco use, Chronic pain syndrome, Anxiety, PTSD, IBS, and GERD who presents to the ED for altered mental status.  Per chart review, family had not heard from the patient for the past 2 days and called for a wellness check.  Patient subsequently found unresponsive sitting on the couch, roommate stated that they thought patient had been sleeping on the couch for the past few days.  Of note, patient was admitted to the hospital a couple of months ago for altered mental status due to suspected polypharmacy use.  Upon EMS arrival patient was given 2 mg of intranasal Narcan with no effect, subsequently given 2 mg IV Narcan with slight respiratory improvement, yet remained unresponsive.  Upon arrival to the ER, BP 94/78, afebrile, 100% on room air, she was noted to have vomit in her airway during suctioning. She was intubated for airway protection. Labs revealed K 6.0, Creat 3.72, BUN 62, AST 162, ALT 85, CK 4155, Troponin 91 and 74, lactic acid 1.6, WBC 19.0, Drug screen + benzodiazepine, opiate, tricyclics. She was given a total of 3 liters of IVF, started on broad spectrum antibiotics, cultures sent, and started on Norepinephrine infusion. CT c-spine without noted fracture, CT head without acute pathology, CT A/P/T bilateral atelectasis, stable non obstructing right renal calculi, hepatic steatosis, aortic atherosclerosis, no acute intra abdominal process.     PCCM was consulted for Medical Management in this critically ill patient in septic shock and respiratory failure suspected due to polysubstance abuse and aspiration Pneumonia   2/12: Admitted with AMS due to polysubstance abuse. Septic shock, levophed, cultures sent, intubated  02/13: Brainstem reflexes improved today with regain of  cough, gag, pupils reactive.  02/14: Improved mental status but not yet following commands.  02/16: Extubated to nasal cannula.  With right sided weakness in upper and lower extremity and facial twitches. Febrile to 101. Neurology is consulted.  2/18: [facility] assumed care 2/20: Mental status waxes and wanes.  Delirium noted last night 2/25: Mental status improved.  Motivated and working with therapy   Assessment & Plan:   Principal Problem:   Acute hypoxic respiratory failure (HCC) Active Problems:   Rhabdomyolysis   Aspiration pneumonia (HCC)   AKI (acute kidney injury) (HCC)   Chronic obstructive pulmonary disease (HCC)   Chronic pain syndrome   Post-traumatic stress disorder   Gastroesophageal reflux disease with esophagitis   High cholesterol   Moderate persistent asthma without complication   Tobacco use disorder   Acute respiratory failure (HCC)  # Unintentional opioid overdose # Opioid use disorder # Chronic pain Denies intentional overdose, denies SI Plan: - will need close outpatient f/u with pain medicine and plan to wean off opioids vs suboxone/subutex or other OUD treatment - continue home prn oxy for now - cont home gabapentin, baclofen (baclofen at reduced dose) 2/28 c/o pain in the R foot sole and top of toes, sensitive to touch.  Possible neuropathic pain.  Patient is only on gabapentin.  Ibuprofen 400 mg 3 times daily x 2 doses order placed Patient does not want Voltaren gel 3/1 patient requested to add gabapentin 300 mg as needed twice daily dosing and Voltaren gel for right foot pain.  # Acute mebabolic encephalopathy Noted to be more confused on 2/20, wandering,  removing IV access Neuro workup reassuring Mental status improving Appears to be at baseline Plan: Continue home psych regimen Educate patient on importance of adherence to prescribed regimen Delirium precautions, frequent reorienting measures Mentation has been improving   #  GAD - cont klonopin - cont home buspar   # Hypokalemia - replete and monitor as needed   # AKI Prerenal 2/2 overdose, resolved   # Loose stool Resolved   # Aspiration pneumonia # Acute hypoxic respiratory failure Extubated and now breathing comfortably on room air, most recent cxr no infiltrates. Finished a course of IV abx in the ICU. MRI nothing acute, EEG no seizure -Patient on room air.  Continue to monitor   # Debility PT advising SNF, TOC is aware and pursuing a bed No bed offers yet.  Patient was educated extensively on the importance of regular working with physical therapy.  She agreed and worked with therapy on 2/21.  I recommended daily interactions with therapy, effective recovery mobilization.  Patient has been willingness to work with therapy services  # Facial twitching # Right upper extremity weakness # Myositis/rhabdomyolysis Patient with severe and persistent right upper extremity weakness.  MRI brain and cervical spine overall unrevealing.  Patient does have diffuse soft tissue edema on right shoulder concerning for myositis.  CK has normalized. Plan: Continue to suspect myositis secondary to rhabdomyolysis.  Upper extremity strength is improving on a daily basis.  Patient is motivated to improve and recover.  She has been actively working with therapy and mobility services on a daily basis.  She will benefit from skilled nursing facility placement. 3/3 patient is requesting a sling for the right arm and heating pad for the right foot pain  Hemorrhoids 3/4 today patient was requesting for hemorrhoidal suppositories which has been ordered    DVT prophylaxis: SQ lovenox Code Status: FULL Family Communication:Daughter via phone 2/19.  Offered to call 2/25 however patient declined Disposition Plan: Status is: Inpatient Remains inpatient appropriate because: Weakness decreased from baseline.  Resolving encephalopathy.  Will need SNF.  She is medically ready for  discharge at this time 3/4 as per [facility] SNF will not take her as patient has no place to go afterwards.  Patient cannot go back to her partner as she is scared, so she is homeless.  DSS worker provided low income housing options and patient has applied [employer] in [place]. Stable to discharge once patient finds the apartment. Reconciled medications and sent scripts to [facility] pharmacy   Level of care: Med-Surg  Consultants:  PCCM Neurology  Procedures:  S/p extubation on 2/16  Antimicrobials: None    Subjective: No significant overnight events, patient always has intermittent sciatica in the right lower extremity and always request to increase the dose of pain medications, but she is always resting comfortably, does not look that she is in pain. Patient is trying to get low price housing but has not got any yet  Objective: Vitals:   12/26/23 1529 12/26/23 2225 12/27/23 0500 12/27/23 0819  BP: 112/83 105/70  101/79  Pulse: 95 91  83  Resp: 16 17  16   Temp: 98.2 F (36.8 C) 98.1 F (36.7 C)  97.7 F (36.5 C)  TempSrc:      SpO2: 97% 97%  95%  Weight:   85.3 kg   Height:        Intake/Output Summary (Last 24 hours) at 12/27/2023 1455 Last data filed at 12/27/2023 1132 Gross per 24 hour  Intake 0 ml  Output --  Net 0 ml      Filed Weights   12/23/23 0221 12/26/23 0500 12/27/23 0500  Weight: 82 kg 84.5 kg 85.3 kg    Examination:  General exam: No acute distress.  Fatigued but in good spirits Respiratory system: Lungs clear, normal WOB, RA Cardiovascular system: S1S2, RRR, no murmur Gastrointestinal system: Soft, NT/ND, normal bowel sounds Central nervous system: Alert and oriented x 3.  No focal deficits Extremities: Right upper extremity weak, improved from prior.  Gait intact Skin: No rashes, lesions or ulcers Psychiatry: Judgement and insight appear impaired. Mood & affect flattened/confused.     Data Reviewed: I have personally reviewed  following labs and imaging studies  CBC: Recent Labs  Lab 12/23/23 0823  WBC 5.8  HGB 12.3  HCT 37.3  MCV 94.2  PLT 316    Basic Metabolic Panel: Recent Labs  Lab 12/23/23 0823  NA 138  K 3.9  CL 104  CO2 26  GLUCOSE 132*  BUN 22  CREATININE 0.67  CALCIUM 9.5  MG 2.0  PHOS 3.7   GFR: Estimated Creatinine Clearance: 81.3 mL/min (by C-G formula based on SCr of 0.67 mg/dL). Liver Function Tests: No results for input(s): "AST", "ALT", "ALKPHOS", "BILITOT", "PROT", "ALBUMIN" in the last 168 hours.  No results for input(s): "LIPASE", "AMYLASE" in the last 168 hours. No results for input(s): "AMMONIA" in the last 168 hours.  Coagulation Profile: No results for input(s): "INR", "PROTIME" in the last 168 hours.  Cardiac Enzymes: No results for input(s): "CKTOTAL", "CKMB", "CKMBINDEX", "TROPONINI" in the last 168 hours.  BNP (last 3 results) No results for input(s): "PROBNP" in the last 8760 hours. HbA1C: No results for input(s): "HGBA1C" in the last 72 hours. CBG: No results for input(s): "GLUCAP" in the last 168 hours.  Lipid Profile: No results for input(s): "CHOL", "HDL", "LDLCALC", "TRIG", "CHOLHDL", "LDLDIRECT" in the last 72 hours. Thyroid Function Tests: No results for input(s): "TSH", "T4TOTAL", "FREET4", "T3FREE", "THYROIDAB" in the last 72 hours. Anemia Panel: No results for input(s): "VITAMINB12", "FOLATE", "FERRITIN", "TIBC", "IRON", "RETICCTPCT" in the last 72 hours.  Sepsis Labs: No results for input(s): "PROCALCITON", "LATICACIDVEN" in the last 168 hours.   No results found for this or any previous visit (from the past 240 hours).    Radiology Studies: No results found.   Scheduled Meds:  baclofen  5 mg Oral BID   bisacodyl  10 mg Oral Once   bisacodyl  10 mg Oral QHS   busPIRone  10 mg Oral TID   diclofenac Sodium  2 g Topical TID   enoxaparin (LOVENOX) injection  40 mg Subcutaneous Q24H   feeding supplement  237 mL Oral TID BM   folic  acid  1 mg Oral Daily   gabapentin  300 mg Oral QID   Gerhardt's butt cream   Topical BID   hydrOXYzine  25 mg Oral Once   montelukast  10 mg Oral QHS   nystatin   Topical TID   pantoprazole  40 mg Oral Daily   polyethylene glycol  17 g Oral BID   prazosin  1 mg Oral QHS   QUEtiapine  100 mg Oral Daily   QUEtiapine  200 mg Oral QHS   thiamine  100 mg Oral Daily   Or   thiamine (VITAMIN B1) injection  100 mg Intravenous Daily   Continuous Infusions:   LOS: 23 days    Gillis Santa, MD [facility]   If 7PM-7AM, please contact night-coverage  12/27/2023, 2:55 PM

## 2023-12-28 DIAGNOSIS — G894 Chronic pain syndrome: Secondary | ICD-10-CM | POA: Diagnosis not present

## 2023-12-28 DIAGNOSIS — J9601 Acute respiratory failure with hypoxia: Secondary | ICD-10-CM | POA: Diagnosis not present

## 2023-12-28 DIAGNOSIS — K2100 Gastro-esophageal reflux disease with esophagitis, without bleeding: Secondary | ICD-10-CM | POA: Diagnosis not present

## 2023-12-28 DIAGNOSIS — M6282 Rhabdomyolysis: Secondary | ICD-10-CM | POA: Diagnosis not present

## 2023-12-28 MED ORDER — HYDROCORTISONE ACETATE 25 MG RE SUPP
25.0000 mg | Freq: Every day | RECTAL | Status: DC | PRN
  Administered 2023-12-29 – 2024-01-02 (×2): 25 mg via RECTAL
  Filled 2023-12-28 (×3): qty 1

## 2023-12-28 NOTE — Progress Notes (Signed)
 PT Cancellation Note  Patient Details Name: Tami Hopkins MRN: [id number] DOB: 1960-11-30   Cancelled Treatment:    Reason Eval/Treat Not Completed: Other (comment) Multiple attempts to engage pt in PT.  Pt declined due to having diarrhea and wanting to be close to  BR in her room.  PT will continue to follow up with pt.   Hortencia Conradi, PTA  12/28/23, 11:00 AM

## 2023-12-28 NOTE — Plan of Care (Signed)

## 2023-12-28 NOTE — Plan of Care (Signed)

## 2023-12-28 NOTE — Progress Notes (Signed)
 Mobility Specialist - Progress Note   12/28/23 1200  Mobility  Activity Refused mobility     Pt declined mobility; reports taking a laxative earlier and has been up and down all morning. Also reporting sharp pain in R foot. Will attempt another date/time.    Filiberto Pinks Mobility Specialist 12/28/23, 12:10 PM

## 2023-12-28 NOTE — Progress Notes (Signed)
 [facility]  - [place] at [facility]   PATIENT NAME: Tami Hopkins    MR#:  [id number]  DATE OF BIRTH:  31-Mar-1961  SUBJECTIVE:  sister at bedside. Patient overall appears stable requesting to continue anusol suppository    VITALS:  Blood pressure 91/71, pulse 98, temperature 98 F (36.7 C), resp. rate 16, height 5\' 8"  (1.727 m), weight 85.4 kg, SpO2 93%.  PHYSICAL EXAMINATION:   GENERAL:  63 y.o.-year-old patient with no acute distress.  LUNGS: Normal breath sounds bilaterally, no wheezing CARDIOVASCULAR: S1, S2 normal. No murmur   ABDOMEN: Soft, nontender, nondistended.  EXTREMITIES: No  edema b/l.    NEUROLOGIC patient is alert and awake, right upper extremity weakness  LABORATORY PANEL:  CBC Recent Labs  Lab 12/23/23 0823  WBC 5.8  HGB 12.3  HCT 37.3  PLT 316    Chemistries  Recent Labs  Lab 12/23/23 0823  NA 138  K 3.9  CL 104  CO2 26  GLUCOSE 132*  BUN 22  CREATININE 0.67  CALCIUM 9.5  MG 2.0    Assessment and Plan Tami Hopkins is a 63 y.o. female with a PMH significant for HLD, COPD, Asthma, Tobacco use, Chronic pain syndrome, Anxiety, PTSD, IBS, and GERD who presents to the ED for altered mental status. Per chart review, family had not heard from the patient for the past 2 days and called for a wellness check. Patient subsequently found unresponsive sitting on the couch, roommate stated that they thought patient had been sleeping on the couch for the past few days.    Unintentional opioid overdose  Opioid use disorder/Chronic pain --Denies intentional overdose, denies SI -- continue home prn oxy for now - cont home gabapentin, baclofen (baclofen at reduced dose)    Acute mebabolic encephalopathy --Noted to be more confused on 2/20, wandering, removing IV access --Appears to be at baseline --Continue home psych regimen   GAD - cont klonopin - cont home buspar    Hypokalemia - replete and monitor as needed    AKI Prerenal 2/2 overdose, resolved   Aspiration pneumonia  Acute hypoxic respiratory failure --Extubated and now breathing comfortably on room air, most recent cxr no infiltrates. Finished a course of IV abx in the ICU. MRI nothing acute, EEG no seizure -Patient on room air.     Debility PT advising SNF, TOC is aware and pursuing a bed No bed offers yet.  Patient was educated extensively on the importance of regular working with physical therapy.  She agreed and worked with therapy on 2/21.     Acute rhabdomyolysis Patient with severe and persistent right upper extremity weakness.  MRI brain and cervical spine overall unrevealing.  Patient does have diffuse soft tissue edema on right shoulder concerning for myositis.  CK has normalized.  Hemorrhoids --hemorrhoidal suppositories     Family communication :sister CODE STATUS: full DVT Prophylaxis : Lovenox Level of care: Med-Surg Status is: Inpatient Remains inpatient appropriate because: patient is medically stable for discharge. TOC working on discharge planning    TOTAL TIME TAKING CARE OF THIS PATIENT: 35 minutes.  >50% time spent on counselling and coordination of care  Note: This dictation was prepared with Dragon dictation along with smaller phrase technology. Any transcriptional errors that result from this process are unintentional.  Tami Hopkins M.D    [facility]   CC: Primary care physician; Ardyth Man, PA-C

## 2023-12-29 DIAGNOSIS — G894 Chronic pain syndrome: Secondary | ICD-10-CM | POA: Diagnosis not present

## 2023-12-29 DIAGNOSIS — K2100 Gastro-esophageal reflux disease with esophagitis, without bleeding: Secondary | ICD-10-CM | POA: Diagnosis not present

## 2023-12-29 DIAGNOSIS — J9601 Acute respiratory failure with hypoxia: Secondary | ICD-10-CM | POA: Diagnosis not present

## 2023-12-29 DIAGNOSIS — M6282 Rhabdomyolysis: Secondary | ICD-10-CM | POA: Diagnosis not present

## 2023-12-29 NOTE — Plan of Care (Signed)

## 2023-12-29 NOTE — Progress Notes (Signed)
 [facility]  - [place] at [facility]   PATIENT NAME: Tami Hopkins    MR#:  [id number]  DATE OF BIRTH:  [birth year]  SUBJECTIVE:  No family at bedside. Patient overall appears stable    VITALS:  Blood pressure 99/67, pulse 86, temperature 98 F (36.7 C), temperature source Oral, resp. rate 16, height 5\' 8"  (1.727 m), weight 85.4 kg, SpO2 95%.  PHYSICAL EXAMINATION:   GENERAL:  63 y.o.-year-old patient with no acute distress.  LUNGS: Normal breath sounds bilaterally, no wheezing CARDIOVASCULAR: S1, S2 normal. No murmur   ABDOMEN: Soft, nontender, nondistended.  EXTREMITIES: No  edema b/l.    NEUROLOGIC patient is alert and awake, right upper extremity weakness  LABORATORY PANEL:  CBC Recent Labs  Lab 12/23/23 0823  WBC 5.8  HGB 12.3  HCT 37.3  PLT 316    Chemistries  Recent Labs  Lab 12/23/23 0823  NA 138  K 3.9  CL 104  CO2 26  GLUCOSE 132*  BUN 22  CREATININE 0.67  CALCIUM 9.5  MG 2.0    Assessment and Plan Tami Hopkins is a 63 y.o. female with a PMH significant for HLD, COPD, Asthma, Tobacco use, Chronic pain syndrome, Anxiety, PTSD, IBS, and GERD who presents to the ED for altered mental status. Per chart review, family had not heard from the patient for the past 2 days and called for a wellness check. Patient subsequently found unresponsive sitting on the couch, roommate stated that they thought patient had been sleeping on the couch for the past few days.    Unintentional opioid overdose  Opioid use disorder/Chronic pain --Denies intentional overdose, denies SI -- continue home prn oxy for now - cont home gabapentin, baclofen (baclofen at reduced dose)    Acute mebabolic encephalopathy --Noted to be more confused on 2/20, wandering, removing IV access --Appears to be at baseline --Continue home psych regimen   GAD - cont klonopin - cont home buspar    Hypokalemia - replete and monitor as needed   AKI Prerenal 2/2  overdose, resolved   Aspiration pneumonia  Acute hypoxic respiratory failure --Extubated and now breathing comfortably on room air, most recent cxr no infiltrates. Finished a course of IV abx in the ICU. MRI nothing acute, EEG no seizure -Patient on room air.     Debility PT advising SNF, TOC is aware and pursuing a bed No bed offers yet.  Patient was educated extensively on the importance of regular working with physical therapy.  She agreed and worked with therapy on 2/21.     Acute rhabdomyolysis Patient with severe and persistent right upper extremity weakness.  MRI brain and cervical spine overall unrevealing.  Patient does have diffuse soft tissue edema on right shoulder concerning for myositis.  CK has normalized.  Hemorrhoids --hemorrhoidal suppositories     Family communication :sister CODE STATUS: full DVT Prophylaxis : Lovenox Level of care: Med-Surg Status is: Inpatient Remains inpatient appropriate because: patient is medically stable for discharge. TOC working on discharge planning    TOTAL TIME TAKING CARE OF THIS PATIENT: 35 minutes.  >50% time spent on counselling and coordination of care  Note: This dictation was prepared with Dragon dictation along with smaller phrase technology. Any transcriptional errors that result from this process are unintentional.  Enedina Finner M.D    [facility]   CC: Primary care physician; Ardyth Man, PA-C

## 2023-12-29 NOTE — Plan of Care (Signed)
  Problem: Education: Goal: Knowledge of General Education information will improve Description Including pain rating scale, medication(s)/side effects and non-pharmacologic comfort measures Outcome: Progressing   Problem: Activity: Goal: Risk for activity intolerance will decrease Outcome: Progressing   Problem: Nutrition: Goal: Adequate nutrition will be maintained Outcome: Progressing   Problem: Elimination: Goal: Will not experience complications related to bowel motility Outcome: Progressing Goal: Will not experience complications related to urinary retention Outcome: Progressing   Problem: Safety: Goal: Ability to remain free from injury will improve Outcome: Progressing   Problem: Skin Integrity: Goal: Risk for impaired skin integrity will decrease Outcome: Progressing   

## 2023-12-30 DIAGNOSIS — K2100 Gastro-esophageal reflux disease with esophagitis, without bleeding: Secondary | ICD-10-CM | POA: Diagnosis not present

## 2023-12-30 DIAGNOSIS — J9601 Acute respiratory failure with hypoxia: Secondary | ICD-10-CM | POA: Diagnosis not present

## 2023-12-30 DIAGNOSIS — G894 Chronic pain syndrome: Secondary | ICD-10-CM | POA: Diagnosis not present

## 2023-12-30 DIAGNOSIS — M6282 Rhabdomyolysis: Secondary | ICD-10-CM | POA: Diagnosis not present

## 2023-12-30 NOTE — TOC Progression Note (Signed)
 Transition of Care [facility]) - Progression Note    Patient Details  Name: Tami Hopkins MRN: [id number] Date of Birth: September 10, 1961  [facility] of [facility]) CM/SW Contact  Marlowe Sax, RN Phone Number: 12/30/2023, 9:41 AM  Clinical Narrative:     Received a call from Sandrea Hughs with DSS, I called back and left a secure VM explaining that I do not have a DC plan for this patient at this time and would love for her to help  Expected Discharge Plan: Homeless Shelter Barriers to Discharge: No Home Care Agency will accept this patient, Unsafe home situation  Expected Discharge Plan and Services   Discharge Planning Services: CM Consult   Living arrangements for the past 2 months: Single Family Home                 DME Arranged: N/A         HH Arranged: NA           Social Determinants of Health (SDOH) Interventions SDOH Screenings   Food Insecurity: Patient Unable To Answer (12/05/2023)  Recent Concern: Food Insecurity - Food Insecurity Present (10/10/2023)  Housing: Patient Unable To Answer (12/05/2023)  Recent Concern: Housing - High Risk (10/09/2023)  Transportation Needs: Patient Unable To Answer (12/05/2023)  Utilities: Patient Unable To Answer (12/05/2023)  Depression (PHQ2-9): Low Risk  (11/21/2023)  Financial Resource Strain: Medium Risk (09/25/2022)   Received from [facility], [facility]  Physical Activity: Insufficiently Active (01/13/2021)   Received from [facility], [facility]  Social Connections: Socially Isolated (01/13/2021)   Received from [facility], [facility]  Stress: Stress Concern Present (01/13/2021)   Received from [facility], [facility]  Tobacco Use: High Risk (12/17/2023)    Readmission Risk Interventions     No data to display

## 2023-12-30 NOTE — Progress Notes (Signed)
 Physical Therapy Treatment Patient Details Name: Tami Hopkins MRN: [id number] DOB: 1961-07-01 Today's Date: 12/30/2023   History of Present Illness This is a 63 year old woman with a history of hyperlipidemia, COPD, asthma, tobacco use, chronic pain syndrome, anxiety, PTSD, IBS, and GERD who presented to the ED for unresponsiveness after being found down at home, possibly for up to 2 days, requiring intubation and admission to the ICU. Etiology favored to be narcotic overdose or polypharmacy.    PT Comments  Pt seen for PT tx with pt agreeable. Pt motivated to walk, ambulating room>gym>around unit & back to room with RW & mod I with steady gait speed. Pt negotiates 6 steps with B rails & step to pattern to ascend, step over step to descend with supervision; pt notes R knee beginning to give out but no buckling noted by PT. PT encouraged pt to attempt gait without AD, 5x STS without BUE support for strengthening, or floor transfer but pt declined, continuing to state she knows what she can do right now & she doesn't want to risk falling; PT provided encouragement & education re: safety measures taken with tasks but pt continues to decline. PT reviewed PT goals & updated based on pt's progress. Will continue to follow pt acutely to progress to updated goals but if pt continues to decline attempts PT may need to sign off as pt is beginning to plateau with mobility.     If plan is discharge home, recommend the following: Assist for transportation;Help with stairs or ramp for entrance   Can travel by private vehicle     Yes  Equipment Recommendations  Rolling walker (2 wheels)    Recommendations for Other Services       Precautions / Restrictions Precautions Precautions: Fall Recall of Precautions/Restrictions: Intact Restrictions Weight Bearing Restrictions Per Provider Order: No     Mobility  Bed Mobility Overal bed mobility: Modified Independent Bed Mobility: Supine to Sit      Supine to sit: Modified independent (Device/Increase time), HOB elevated          Transfers Overall transfer level: Needs assistance Equipment used: Rolling walker (2 wheels) Transfers: Sit to/from Stand Sit to Stand: Modified independent (Device/Increase time)                Ambulation/Gait Ambulation/Gait assistance: Modified independent (Device/Increase time) Gait Distance (Feet):  (>150 ft) Assistive device: Rolling walker (2 wheels), None Gait Pattern/deviations: Decreased step length - left, Decreased step length - right, Decreased stride length Gait velocity: slightly decreased     General Gait Details: no LOB   Stairs Stairs: Yes Stairs assistance: Supervision Stair Management: Two rails Number of Stairs: 6 (6") General stair comments: step-to to ascend stairs, step over step to descend, pt reports R knee weakness (almost buckling) so defers further stair negotiation (PT did not observe buckling)   Wheelchair Mobility     Tilt Bed    Modified Rankin (Stroke Patients Only)       Balance Overall balance assessment: Needs assistance Sitting-balance support: No upper extremity supported, Feet supported Sitting balance-Leahy Scale: Good     Standing balance support: During functional activity, Bilateral upper extremity supported, Reliant on assistive device for balance Standing balance-Leahy Scale: Good Standing balance comment: steady ambulating with RW use                            Communication Communication Communication: No apparent difficulties  Cognition   Behavior  During Therapy: WFL for tasks assessed/performed   PT - Cognitive impairments: No apparent impairments                         Following commands: Intact Following commands impaired: Only follows one step commands consistently    Cueing Cueing Techniques: Verbal cues  Exercises      General Comments        Pertinent Vitals/Pain Pain  Assessment Pain Assessment: 0-10 Pain Score: 10-Worst pain ever Pain Location: R foot Pain Descriptors / Indicators:  ("feels like gravel under my skin") Pain Intervention(s): Monitored during session    Home Living                          Prior Function            PT Goals (current goals can now be found in the care plan section) Acute Rehab PT Goals Patient Stated Goal: go home PT Goal Formulation: With patient Time For Goal Achievement: 01/13/24 Potential to Achieve Goals: Good Additional Goals Additional Goal #1: Pt will score 19/21 on DGI to demonstrate decreased fall risk with mobility. Additional Goal #2: Pt will complete floor transfer with mod I. Progress towards PT goals: Progressing toward goals;Goals met and updated - see care plan    Frequency    Min 1X/week      PT Plan      Co-evaluation              AM-PAC PT "6 Clicks" Mobility   Outcome Measure  Help needed turning from your back to your side while in a flat bed without using bedrails?: None Help needed moving from lying on your back to sitting on the side of a flat bed without using bedrails?: None Help needed moving to and from a bed to a chair (including a wheelchair)?: None Help needed standing up from a chair using your arms (e.g., wheelchair or bedside chair)?: None Help needed to walk in hospital room?: None Help needed climbing 3-5 steps with a railing? : A Little 6 Click Score: 23    End of Session   Activity Tolerance: Patient tolerated treatment well Patient left: in bed;with call bell/phone within reach Nurse Communication: Mobility status PT Visit Diagnosis: Other abnormalities of gait and mobility (R26.89);Difficulty in walking, not elsewhere classified (R26.2);Muscle weakness (generalized) (M62.81)     Time: 5284-1324 PT Time Calculation (min) (ACUTE ONLY): 17 min  Charges:    $Therapeutic Activity: 8-22 mins PT General Charges $$ ACUTE PT VISIT: 1  Visit                     Aleda Grana, PT, DPT 12/30/23, 1:26 PM   Sandi Mariscal 12/30/2023, 1:24 PM

## 2023-12-30 NOTE — Progress Notes (Signed)
 [facility]  - [place] at [facility]   PATIENT NAME: Tami Hopkins    MR#:  [id number]  DATE OF BIRTH:  October 29, 1960  SUBJECTIVE:  No family at bedside. Patient overall appears stable  VITALS:  Blood pressure (!) 109/93, pulse 99, temperature 98.2 F (36.8 C), resp. rate 19, height 5\' 8"  (1.727 m), weight 85.4 kg, SpO2 96%.  PHYSICAL EXAMINATION:   GENERAL:  63 y.o.-year-old patient with no acute distress.  LUNGS: Normal breath sounds bilaterally, no wheezing CARDIOVASCULAR: S1, S2 normal. No murmur   ABDOMEN: Soft, nontender, nondistended.  EXTREMITIES: No  edema b/l.    NEUROLOGIC patient is alert and awake, right upper extremity weakness   Assessment and Plan LOYS HOSELTON is a 63 y.o. female with a PMH significant for HLD, COPD, Asthma, Tobacco use, Chronic pain syndrome, Anxiety, PTSD, IBS, and GERD who presents to the ED for altered mental status. Per chart review, family had not heard from the patient for the past 2 days and called for a wellness check. Patient subsequently found unresponsive sitting on the couch, roommate stated that they thought patient had been sleeping on the couch for the past few days.    Unintentional opioid overdose  Opioid use disorder/Chronic pain --Denies intentional overdose, denies SI -- continue home prn oxy for now --cont home gabapentin, baclofen (baclofen at reduced dose)    Acute mebabolic encephalopathy --Noted to be more confused on 2/20, wandering, removing IV access --Appears to be at baseline --Continue home psych regimen   GAD - cont klonopin - cont home buspar    Hypokalemia - replete and monitor as needed   AKI Prerenal 2/2 overdose, resolved   Aspiration pneumonia  Acute hypoxic respiratory failure --Extubated and now breathing comfortably on room air, most recent cxr no infiltrates. Finished a course of IV abx in the ICU. MRI nothing acute, EEG no seizure -Patient on room air.      Debility PT advising SNF, TOC is aware and pursuing a bed No bed offers yet.  Patient was educated extensively on the importance of regular working with physical therapy.  She agreed and worked with therapy on 2/21.     Acute rhabdomyolysis Patient with severe and persistent right upper extremity weakness.  MRI brain and cervical spine overall unrevealing.  Patient does have diffuse soft tissue edema on right shoulder concerning for myositis.  CK has normalized.  Hemorrhoids --hemorrhoidal suppositories     Family communication :sister CODE STATUS: full DVT Prophylaxis : Lovenox Level of care: Med-Surg Status is: Inpatient Remains inpatient appropriate because: patient is medically stable for discharge. TOC working on discharge planning    TOTAL TIME TAKING CARE OF THIS PATIENT: 35 minutes.  >50% time spent on counselling and coordination of care  Note: This dictation was prepared with Dragon dictation along with smaller phrase technology. Any transcriptional errors that result from this process are unintentional.  Enedina Finner M.D    [facility]   CC: Primary care physician; Ardyth Man, PA-C

## 2023-12-31 DIAGNOSIS — J9601 Acute respiratory failure with hypoxia: Secondary | ICD-10-CM | POA: Diagnosis not present

## 2023-12-31 NOTE — Progress Notes (Signed)
 [facility]  - [place] [facility]   PATIENT NAME: Tami Hopkins    MR#:  [id number]  DATE OF BIRTH:  1961-09-14  SUBJECTIVE:  Reports stable ongoing pains throughout her body  VITALS:  Blood pressure 117/83, pulse 86, temperature 98.1 F (36.7 C), resp. rate 18, height 5\' 8"  (1.727 m), weight 85.4 kg, SpO2 95%.  PHYSICAL EXAMINATION:   GENERAL:  63 y.o.-year-old patient with no acute distress.  LUNGS: Normal breath sounds bilaterally, no wheezing CARDIOVASCULAR: S1, S2 normal. No murmur   ABDOMEN: Soft, nontender, nondistended.  EXTREMITIES: No  edema b/l.    NEUROLOGIC patient is alert and awake, right upper extremity weakness   Assessment and Plan Tami Hopkins is a 63 y.o. female with a PMH significant for HLD, COPD, Asthma, Tobacco use, Chronic pain syndrome, Anxiety, PTSD, IBS, and GERD who presents to the ED for altered mental status. Per chart review, family had not heard from the patient for the past 2 days and called for a wellness check. Patient subsequently found unresponsive sitting on the couch, roommate stated that they thought patient had been sleeping on the couch for the past few days.    Unintentional opioid overdose  Opioid use disorder/Chronic pain --Denies intentional overdose, denies SI -- continue home prn oxy for now --cont home gabapentin, baclofen (baclofen at reduced dose)    Acute mebabolic encephalopathy --Noted to be more confused on 2/20, wandering, removing IV access --Appears to be at baseline --Continue home psych regimen   GAD - cont klonopin - cont home buspar    Hypokalemia - replete and monitor as needed   AKI Prerenal 2/2 overdose, resolved   Aspiration pneumonia  Acute hypoxic respiratory failure --Extubated and now breathing comfortably on room air, most recent cxr no infiltrates. Finished a course of IV abx in the ICU. MRI nothing acute, EEG no seizure -Patient on room air.     Debility PT no  longer advising SNF, says safe to discharge Will discuss dispo with toc today   Acute rhabdomyolysis Patient with severe and persistent right upper extremity weakness.  MRI brain and cervical spine overall unrevealing.  Patient does have diffuse soft tissue edema on right shoulder concerning for myositis. Possible brachial plexus injury from sustained immobility. CK has normalized. Neurology advises outpt neuro f/u, possible emg   Hemorrhoids --hemorrhoidal suppositories     Family communication : none at bedside CODE STATUS: full DVT Prophylaxis : Lovenox Level of care: Med-Surg Status is: Inpatient Remains inpatient appropriate because: patient is medically stable for discharge. TOC working on discharge planning   Silvano Bilis M.D    [facility]

## 2023-12-31 NOTE — Plan of Care (Signed)

## 2023-12-31 NOTE — Progress Notes (Signed)
 OT Cancellation Note  Patient Details Name: Tami Hopkins MRN: [id number] DOB: [birth year]   Cancelled Treatment:    Reason Eval/Treat Not Completed: Patient declined, no reason specified. On arrival pt states "I dont have time for that right now, I am too busy working on this apartment stuff." Will reattempt as able.   Kathie Dike, M.S. OTR/L  12/31/23, 4:02 PM  ascom [phone]

## 2024-01-01 DIAGNOSIS — J9601 Acute respiratory failure with hypoxia: Secondary | ICD-10-CM | POA: Diagnosis not present

## 2024-01-01 MED ORDER — LIDOCAINE 4 % EX CREA
TOPICAL_CREAM | Freq: Every day | CUTANEOUS | Status: DC | PRN
  Filled 2024-01-01 (×2): qty 5

## 2024-01-01 NOTE — Plan of Care (Signed)
  Problem: Education: Goal: Knowledge of General Education information will improve Description: Including pain rating scale, medication(s)/side effects and non-pharmacologic comfort measures 01/01/2024 1202 by Faustino Congress, RN Outcome: Progressing 01/01/2024 1202 by Faustino Congress, RN Outcome: Progressing   Problem: Health Behavior/Discharge Planning: Goal: Ability to manage health-related needs will improve 01/01/2024 1202 by Faustino Congress, RN Outcome: Progressing 01/01/2024 1202 by Faustino Congress, RN Outcome: Progressing   Problem: Clinical Measurements: Goal: Ability to maintain clinical measurements within normal limits will improve 01/01/2024 1202 by Faustino Congress, RN Outcome: Progressing 01/01/2024 1202 by Faustino Congress, RN Outcome: Progressing Goal: Will remain free from infection 01/01/2024 1202 by Faustino Congress, RN Outcome: Progressing 01/01/2024 1202 by Faustino Congress, RN Outcome: Progressing Goal: Diagnostic test results will improve 01/01/2024 1202 by Faustino Congress, RN Outcome: Progressing 01/01/2024 1202 by Faustino Congress, RN Outcome: Progressing Goal: Respiratory complications will improve 01/01/2024 1202 by Faustino Congress, RN Outcome: Progressing 01/01/2024 1202 by Faustino Congress, RN Outcome: Progressing Goal: Cardiovascular complication will be avoided 01/01/2024 1202 by Faustino Congress, RN Outcome: Progressing 01/01/2024 1202 by Faustino Congress, RN Outcome: Progressing   Problem: Activity: Goal: Risk for activity intolerance will decrease 01/01/2024 1202 by Faustino Congress, RN Outcome: Progressing 01/01/2024 1202 by Faustino Congress, RN Outcome: Progressing   Problem: Nutrition: Goal: Adequate nutrition will be maintained 01/01/2024 1202 by Faustino Congress, RN Outcome: Progressing 01/01/2024 1202 by Faustino Congress, RN Outcome: Progressing   Problem: Coping: Goal: Level of anxiety will decrease 01/01/2024 1202 by Faustino Congress, RN Outcome:  Progressing 01/01/2024 1202 by Faustino Congress, RN Outcome: Progressing   Problem: Elimination: Goal: Will not experience complications related to bowel motility 01/01/2024 1202 by Faustino Congress, RN Outcome: Progressing 01/01/2024 1202 by Faustino Congress, RN Outcome: Progressing Goal: Will not experience complications related to urinary retention 01/01/2024 1202 by Faustino Congress, RN Outcome: Progressing 01/01/2024 1202 by Faustino Congress, RN Outcome: Progressing   Problem: Pain Managment: Goal: General experience of comfort will improve and/or be controlled 01/01/2024 1202 by Faustino Congress, RN Outcome: Progressing 01/01/2024 1202 by Faustino Congress, RN Outcome: Progressing   Problem: Safety: Goal: Ability to remain free from injury will improve 01/01/2024 1202 by Faustino Congress, RN Outcome: Progressing 01/01/2024 1202 by Faustino Congress, RN Outcome: Progressing   Problem: Skin Integrity: Goal: Risk for impaired skin integrity will decrease 01/01/2024 1202 by Faustino Congress, RN Outcome: Progressing 01/01/2024 1202 by Faustino Congress, RN Outcome: Progressing

## 2024-01-01 NOTE — Progress Notes (Signed)
 Occupational Therapy Treatment Patient Details Name: Tami Hopkins MRN: [id number] DOB: Feb 13, 1961 Today's Date: 01/01/2024   History of present illness This is a 63 year old woman with a history of hyperlipidemia, COPD, asthma, tobacco use, chronic pain syndrome, anxiety, PTSD, IBS, and GERD who presented to the ED for unresponsiveness after being found down at home, possibly for up to 2 days, requiring intubation and admission to the ICU. Etiology favored to be narcotic overdose or polypharmacy.   OT comments  Tami Hopkins was seen for OT treatment on this date. Upon arrival to room pt in bed, agreeable to bed level tx stating she recently was OOB independently. Pt currently MOD I for bed mobility and LB access. Tolerated neuromuscular re-education at bed level: tapping facilitated bicep flexion/extension - achieves full ROM with difficulty controlling end range. Educated on HEP. Pt has met ADL t/f goal and LBD goals, updated goals this session. Will continue to follow POC. Discharge recommendation remains appropriate.        If plan is discharge home, recommend the following:  A little help with bathing/dressing/bathroom;A little help with walking and/or transfers   Equipment Recommendations  BSC/3in1    Recommendations for Other Services      Precautions / Restrictions Precautions Precautions: Fall Recall of Precautions/Restrictions: Intact Restrictions Weight Bearing Restrictions Per Provider Order: No       Mobility Bed Mobility Overal bed mobility: Modified Independent                  Transfers                   General transfer comment: pt deferred         ADL either performed or assessed with clinical judgement   ADL Overall ADL's : Needs assistance/impaired                                       General ADL Comments: MOD I simulated LB dressing at bed level.    Extremity/Trunk Assessment Upper Extremity Assessment Upper  Extremity Assessment: RUE deficits/detail RUE Deficits / Details: bicep/tricep 3-/5, moves through full elbow AROM in gravity eliminated plane. grip 4/5. shoulder elevation 4/5.   Lower Extremity Assessment Lower Extremity Assessment: Overall WFL for tasks assessed                 Communication Communication Communication: No apparent difficulties   Cognition Arousal: Alert Behavior During Therapy: WFL for tasks assessed/performed Cognition: No family/caregiver present to determine baseline                               Following commands: Intact Following commands impaired: Only follows one step commands consistently      Cueing      Exercises Other Exercises Other Exercises: NMR: tapping facilitated bicep flexion/extension at bed level, achieves full ROM with difficulty controlling end range. Educated on HEP.    Shoulder Instructions       General Comments      Pertinent Vitals/ Pain       Pain Assessment Pain Assessment: 0-10 Pain Score: 10-Worst pain ever Pain Location: R foot Pain Descriptors / Indicators: Stabbing, Sharp Pain Intervention(s): Repositioned, Premedicated before session   Frequency  Min 1X/week        Progress Toward Goals  OT Goals(current goals can now be found in the  care plan section)  Progress towards OT goals: Goals met and updated - see care plan  Acute Rehab OT Goals Patient Stated Goal: go home to dog OT Goal Formulation: With patient Time For Goal Achievement: 01/15/24 Potential to Achieve Goals: Good ADL Goals Pt Will Perform Upper Body Dressing: with modified independence;sitting Pt/caregiver will Perform Home Exercise Program: Increased ROM;Increased strength;Right Upper extremity;With written HEP provided  Plan      Co-evaluation                 AM-PAC OT "6 Clicks" Daily Activity     Outcome Measure   Help from another person eating meals?: None Help from another person taking care of  personal grooming?: None Help from another person toileting, which includes using toliet, bedpan, or urinal?: A Little Help from another person bathing (including washing, rinsing, drying)?: A Little Help from another person to put on and taking off regular upper body clothing?: A Little Help from another person to put on and taking off regular lower body clothing?: A Little 6 Click Score: 20    End of Session    OT Visit Diagnosis: Other abnormalities of gait and mobility (R26.89);Muscle weakness (generalized) (M62.81)   Activity Tolerance Patient tolerated treatment well   Patient Left in bed;with call bell/phone within reach   Nurse Communication          Time: 1610-9604 OT Time Calculation (min): 24 min  Charges: OT General Charges $OT Visit: 1 Visit OT Treatments $Self Care/Home Management : 8-22 mins $Neuromuscular Re-education: 8-22 mins  Kathie Dike, M.S. OTR/L  01/01/24, 12:38 PM  ascom [phone]

## 2024-01-01 NOTE — Progress Notes (Signed)
 [facility]  - [place] at [facility]   PATIENT NAME: Tami Hopkins    MR#:  [id number]  DATE OF BIRTH:  January 29, 1961  SUBJECTIVE:  Ongoing right foot pain and other body pains, tolerating diet  VITALS:  Blood pressure 100/78, pulse 94, temperature 98 F (36.7 C), temperature source Oral, resp. rate 17, height 5\' 8"  (1.727 m), weight 85.4 kg, SpO2 94%.  PHYSICAL EXAMINATION:   GENERAL:  63 y.o.-year-old patient with no acute distress.  LUNGS: Normal breath sounds bilaterally, no wheezing CARDIOVASCULAR: S1, S2 normal. No murmur   ABDOMEN: Soft, nontender, nondistended.  EXTREMITIES: No  edema b/l.    NEUROLOGIC patient is alert and awake, right upper extremity weakness   Assessment and Plan Tami Hopkins is a 63 y.o. female with a PMH significant for HLD, COPD, Asthma, Tobacco use, Chronic pain syndrome, Anxiety, PTSD, IBS, and GERD who presents to the ED for altered mental status. Per chart review, family had not heard from the patient for the past 2 days and called for a wellness check. Patient subsequently found unresponsive sitting on the couch, roommate stated that they thought patient had been sleeping on the couch for the past few days.    Unintentional opioid overdose  Opioid use disorder/Chronic pain --Denies intentional overdose, denies SI -- continue home prn oxy for now --cont home gabapentin, baclofen (baclofen at reduced dose)    Acute mebabolic encephalopathy resolved   GAD - cont klonopin - cont home buspar    Hypokalemia - replete and monitor as needed   AKI Prerenal 2/2 overdose, resolved   Aspiration pneumonia  Acute hypoxic respiratory failure --Extubated and now breathing comfortably on room air, most recent cxr no infiltrates. Finished a course of IV abx in the ICU. MRI nothing acute, EEG no seizure -Patient on room air.     Debility PT no longer advising SNF, says safe to discharge Patient has a rolling walker and  bedside toilet  Acute rhabdomyolysis Patient with severe and persistent right upper extremity weakness.  MRI brain and cervical spine overall unrevealing.  Patient does have diffuse soft tissue edema on right shoulder concerning for myositis. Possible brachial plexus injury from sustained immobility. CK has normalized. Neurology advises outpt neuro f/u, possible emg   Hemorrhoids --hemorrhoidal suppositories     Family communication : none at bedside CODE STATUS: full DVT Prophylaxis : Lovenox Level of care: Med-Surg Status is: Inpatient Remains inpatient appropriate because: patient is medically stable for discharge. Planning on discharge tomorrow, she will be renting a motel room until her apartment is available   Silvano Bilis M.D    [facility]

## 2024-01-01 NOTE — Plan of Care (Signed)

## 2024-01-02 DIAGNOSIS — J9601 Acute respiratory failure with hypoxia: Secondary | ICD-10-CM | POA: Diagnosis not present

## 2024-01-02 MED ORDER — QUETIAPINE FUMARATE 200 MG PO TABS
200.0000 mg | ORAL_TABLET | Freq: Every day | ORAL | 1 refills | Status: AC
Start: 2024-01-02 — End: ?

## 2024-01-02 MED ORDER — OXYCODONE HCL 5 MG PO TABS: 5.0000 mg | ORAL_TABLET | Freq: Four times a day (QID) | ORAL | 0 refills | Status: AC | PRN

## 2024-01-02 MED ORDER — MONTELUKAST SODIUM 10 MG PO TABS: 10.0000 mg | ORAL_TABLET | Freq: Every day | ORAL | 1 refills | Status: AC

## 2024-01-02 MED ORDER — BUSPIRONE HCL 10 MG PO TABS: 10.0000 mg | ORAL_TABLET | Freq: Three times a day (TID) | ORAL | 1 refills | Status: AC

## 2024-01-02 MED ORDER — BACLOFEN 5 MG PO TABS: 5.0000 mg | ORAL_TABLET | Freq: Two times a day (BID) | ORAL | 0 refills | Status: DC

## 2024-01-02 MED ORDER — PANTOPRAZOLE SODIUM 40 MG PO TBEC: 40.0000 mg | DELAYED_RELEASE_TABLET | Freq: Every day | ORAL | 1 refills | Status: AC

## 2024-01-02 MED ORDER — LIDOCAINE 4 % EX CREA: TOPICAL_CREAM | Freq: Every day | CUTANEOUS | 0 refills | Status: AC | PRN

## 2024-01-02 MED ORDER — QUETIAPINE FUMARATE 100 MG PO TABS: 100.0000 mg | ORAL_TABLET | Freq: Every day | ORAL | 1 refills | Status: AC

## 2024-01-02 MED ORDER — PRAZOSIN HCL 1 MG PO CAPS: 1.0000 mg | ORAL_CAPSULE | Freq: Every day | ORAL | 1 refills | Status: AC

## 2024-01-02 MED ORDER — GABAPENTIN 300 MG PO CAPS: 300.0000 mg | ORAL_CAPSULE | Freq: Four times a day (QID) | ORAL | 1 refills | Status: DC

## 2024-01-02 MED ORDER — CLONAZEPAM 1 MG PO TABS: 1.0000 mg | ORAL_TABLET | Freq: Two times a day (BID) | ORAL | 0 refills | Status: AC | PRN

## 2024-01-02 NOTE — Discharge Summary (Signed)
 Tami Hopkins:[id number] DOB: 1961-01-29 DOA: 12/04/2023  PCP: Ardyth Man, PA-C  Admit date: 12/04/2023 Discharge date: 01/02/2024  Time spent: 35 minutes  Recommendations for Outpatient Follow-up:  Pcp f/u Neurology f/u Patient would greatly benefit from a gradual discontinuation of narcotics     Discharge Diagnoses:  Principal Problem:   Acute hypoxic respiratory failure (HCC) Active Problems:   Rhabdomyolysis   Aspiration pneumonia (HCC)   AKI (acute kidney injury) (HCC)   Chronic obstructive pulmonary disease (HCC)   Chronic pain syndrome   Post-traumatic stress disorder   Gastroesophageal reflux disease with esophagitis   High cholesterol   Moderate persistent asthma without complication   Tobacco use disorder   Acute respiratory failure (HCC)   Discharge Condition: stable  Diet recommendation: heart healthy  Filed Weights   12/26/23 0500 12/27/23 0500 12/28/23 0500  Weight: 84.5 kg 85.3 kg 85.4 kg    History of present illness:  From admission h and p Tami Hopkins is a 63 y.o. female with a PMH significant for HLD, COPD, Asthma, Tobacco use, Chronic pain syndrome, Anxiety, PTSD, IBS, and GERD who presents to the ED for altered mental status.  Per chart review, family had not heard from the patient for the past 2 days and called for a wellness check.  Patient subsequently found unresponsive sitting on the couch, roommate stated that they thought patient had been sleeping on the couch for the past few days.  Of note, patient was admitted to the hospital a couple of months ago for altered mental status due to suspected polypharmacy use.  Upon EMS arrival patient was given 2 mg of intranasal Narcan with no effect, subsequently given 2 mg IV Narcan with slight respiratory improvement, yet remained unresponsive.  Upon arrival to the ER, BP 94/78, afebrile, 100% on room air, she was noted to have vomit in her airway during suctioning. She was intubated for  airway protection. Labs revealed K 6.0, Creat 3.72, BUN 62, AST 162, ALT 85, CK 4155, Troponin 91 and 74, lactic acid 1.6, WBC 19.0, Drug screen + benzodiazepine, opiate, tricyclics. She was given a total of 3 liters of IVF, started on broad spectrum antibiotics, cultures sent, and started on Norepinephrine infusion. CT c-spine without noted fracture, CT head without acute pathology, CT A/P/T bilateral atelectasis, stable non obstructing right renal calculi, hepatic steatosis, aortic atherosclerosis, no acute intra abdominal process.   Hospital Course:  Presents after being found down after likely unintentional opioid overdose. She was intubated for airway protection and successfully extubated a few days later. AKI resolved with fluids. Was also treated for aspiration pneumonia. Also with acute rhabdomyolysis and right upper extremity weakness. Neurology evaluated, MRI performed, the weakness appears to be from compression neuropathy or selective rhabdomyolysis of the right upper extremity. Will need outpatient neurology f/u (referral placed). Evaluated by PT/OT, initial rec was skilled nursing but she progressed and is now suitable for home health (which she declines). She has the rolling walker PT/OT advise. Hospital course prolonged due to no discharge plan. Plan now is discharge to a motel, patient says she has an apartment that will be ready next week and has family support. She declines home health PT/OT. We maintained her on reduced doses of home benzodiazepine and oxycodone. She will be provided with a one month supply to tide her until she can be seen by her prescribing physician. She would greatly benefit from continued gradual reduction and eventual discontinuation of all narcotics.   Procedures: none  Consultations: neurology  Discharge Exam: Vitals:   01/01/24 1416 01/01/24 2007  BP: (!) 107/90 117/83  Pulse: 86 87  Resp: 17 18  Temp: 97.7 F (36.5 C) 97.9 F (36.6 C)  SpO2: 98% 98%     GENERAL:  63 y.o.-year-old patient with no acute distress.  LUNGS: Normal breath sounds bilaterally, no wheezing CARDIOVASCULAR: S1, S2 normal. No murmur   ABDOMEN: Soft, nontender, nondistended.  EXTREMITIES: No  edema b/l.    NEUROLOGIC patient is alert and awake, right upper extremity weakness  Discharge Instructions   Discharge Instructions     Ambulatory referral to Neurology   Complete by: As directed    Diet - low sodium heart healthy   Complete by: As directed    Increase activity slowly   Complete by: As directed       Allergies as of 01/02/2024       Reactions   Penicillins Anaphylaxis   Any "cillins"   Sulfa Antibiotics Anaphylaxis   Amitriptyline    Can not take with other medications  Can not take with other medications  Can not take with other medications    Bupropion    Can not take with other combination medications  Can not take with other combination medications  Can not take with other combination medications    Doxepin    Can not take with other medications  Can not take with other medications  Can not take with other medications    Moxifloxacin Other (See Comments)        Medication List     STOP taking these medications    Arnuity Ellipta 200 MCG/ACT Aepb Generic drug: Fluticasone Furoate   atorvastatin 40 MG tablet Commonly known as: LIPITOR   diphenoxylate-atropine 2.5-0.025 MG tablet Commonly known as: LOMOTIL   furosemide 40 MG tablet Commonly known as: LASIX   Klor-Con 10 10 MEQ tablet Generic drug: potassium chloride   oxyCODONE-acetaminophen 5-325 MG tablet Commonly known as: Percocet   oxyCODONE-acetaminophen 7.5-325 MG tablet Commonly known as: Percocet   thiamine 100 MG tablet Commonly known as: Vitamin B-1       TAKE these medications    albuterol 108 (90 Base) MCG/ACT inhaler Commonly known as: VENTOLIN HFA Inhale 2 puffs into the lungs every 6 (six) hours as needed for wheezing or shortness of  breath. What changed:  how much to take when to take this reasons to take this   Baclofen 5 MG Tabs Take 1 tablet (5 mg total) by mouth 2 (two) times daily. What changed:  medication strength how much to take   bisacodyl 5 MG EC tablet Commonly known as: DULCOLAX Take 2 tablets (10 mg total) by mouth at bedtime as needed for moderate constipation.   busPIRone 10 MG tablet Commonly known as: BUSPAR Take 1 tablet (10 mg total) by mouth 3 (three) times daily. What changed: when to take this   clonazePAM 1 MG tablet Commonly known as: KLONOPIN Take 1 tablet (1 mg total) by mouth 2 (two) times daily as needed.   diclofenac Sodium 1 % Gel Commonly known as: VOLTAREN Apply 2 g topically 3 (three) times daily.   fluticasone 50 MCG/ACT nasal spray Commonly known as: FLONASE Place 1 spray into both nostrils 2 (two) times daily.   folic acid 1 MG tablet Commonly known as: FOLVITE Take 1 tablet (1 mg total) by mouth daily.   gabapentin 300 MG capsule Commonly known as: NEURONTIN Take 1 capsule (300 mg total) by mouth  4 (four) times daily. What changed: additional instructions   hydrocortisone 25 MG suppository Commonly known as: ANUSOL-HC Place 1 suppository (25 mg total) rectally 2 (two) times daily as needed for up to 12 days for hemorrhoids or anal itching.   ipratropium 0.06 % nasal spray Commonly known as: ATROVENT Place into the nose.   lidocaine 4 % cream Commonly known as: LMX Apply topically daily as needed (pain).   montelukast 10 MG tablet Commonly known as: SINGULAIR Take 1 tablet (10 mg total) by mouth at bedtime.   oxyCODONE 5 MG immediate release tablet Commonly known as: Oxy IR/ROXICODONE Take 1 tablet (5 mg total) by mouth every 6 (six) hours as needed for moderate pain (pain score 4-6).   pantoprazole 40 MG tablet Commonly known as: PROTONIX Take 1 tablet (40 mg total) by mouth daily.   polyethylene glycol powder 17 GM/SCOOP powder Commonly  known as: GLYCOLAX/MIRALAX Take 17 g by mouth 2 (two) times daily.   prazosin 1 MG capsule Commonly known as: MINIPRESS Take 1 capsule (1 mg total) by mouth at bedtime. What changed:  how much to take when to take this   promethazine 25 MG tablet Commonly known as: PHENERGAN Take 25 mg by mouth every 6 (six) hours as needed.   QUEtiapine 100 MG tablet Commonly known as: SEROQUEL Take 1 tablet (100 mg total) by mouth daily. What changed:  how much to take when to take this additional instructions   QUEtiapine 200 MG tablet Commonly known as: SEROQUEL Take 1 tablet (200 mg total) by mouth at bedtime. What changed: You were already taking a medication with the same name, and this prescription was added. Make sure you understand how and when to take each.       Allergies  Allergen Reactions   Penicillins Anaphylaxis    Any "cillins"   Sulfa Antibiotics Anaphylaxis   Amitriptyline     Can not take with other medications  Can not take with other medications  Can not take with other medications    Bupropion     Can not take with other combination medications  Can not take with other combination medications  Can not take with other combination medications    Doxepin     Can not take with other medications  Can not take with other medications  Can not take with other medications    Moxifloxacin Other (See Comments)    Follow-up Information     Ardyth Man, PA-C Follow up in 1 week(s).   Specialty: Family Medicine Contact information: [address] [place] [address] [phone]         Morene Crocker, MD Follow up.   Specialty: Neurology Contact information: [address] [facility] [place] [address] [phone]                  The results of significant diagnostics from this hospitalization (including imaging, microbiology, ancillary and laboratory) are listed below for reference.    Significant  Diagnostic Studies: MR BRACHIAL PLEXUS W WO CM RT Result Date: 12/11/2023 CLINICAL DATA:  Right upper extremity weakness. History of polysubstance abuse. Elevated CK. EXAM: MRI BRACHIAL PLEXUS WITHOUT AND WITH CONTRAST TECHNIQUE: Multiplanar, multiecho pulse sequences of the neck and surrounding structures were obtained without and with intravenous contrast. The field of view was focused on the right brachial plexus from the neural foramina to the axilla. CONTRAST:  10mL GADAVIST GADOBUTROL 1 MMOL/ML IV SOLN COMPARISON:  MRI cervical  spine from same day. FINDINGS: Spinal cord Normal caliber and signal of visualized spinal cord. Brachial plexus Roots: Normal. Trunks: Normal. Divisions: Normal. Cords: Normal. Branches: Normal. Muscles and tendons Prominent edema and enhancement in the right paraspinous muscles extending from C4 to T3. Diffuse low-level muscle edema in the right infraspinatus and to a lesser extent the supraspinatus muscles. No significant muscle atrophy. Bones Cervical spine: Please see separate MRI cervical spine report from same day. C7 transverse processes: Normal. Marrow signal: Normal. Other findings None. IMPRESSION: 1. Prominent edema and enhancement in the right paraspinous muscles extending from C4 to T3. This is nonspecific but may reflect rhabdomyolysis given clinical history. 2. Diffuse low-level muscle edema in the right infraspinatus and to a lesser extent the supraspinatus muscles, suspicious for denervation related change (acute brachial neuritis). No muscle atrophy. 3. Normal appearance of the right brachial plexus. Electronically Signed   By: Obie Dredge M.D.   On: 12/11/2023 08:59   MR CERVICAL SPINE W WO CONTRAST Result Date: 12/10/2023 CLINICAL DATA:  Right-sided arm weakness EXAM: MRI CERVICAL SPINE WITHOUT AND WITH CONTRAST TECHNIQUE: Multiplanar and multiecho pulse sequences of the cervical spine, to include the craniocervical junction and cervicothoracic junction,  were obtained without and with intravenous contrast. CONTRAST:  10mL GADAVIST GADOBUTROL 1 MMOL/ML IV SOLN COMPARISON:  None Available. FINDINGS: Alignment: Physiologic. Vertebrae: No fracture, evidence of discitis, or bone lesion. Cord: Normal signal and morphology. Posterior Fossa, vertebral arteries, paraspinal tissues: There is diffuse edema throughout the right-sided paraspinal muscles. This is associated with areas of irregular contrast enhancement, possibly indicating myositis. This extends inferiorly as far as the upper thoracic spine. Disc levels: C1-2: Unremarkable. C2-3: Normal disc space and facet joints. There is no spinal canal stenosis. No neural foraminal stenosis. C3-4: Moderate left facet hypertrophy. There is no spinal canal stenosis. Mild left neural foraminal stenosis. C4-5: Small disc bulge. There is no spinal canal stenosis. No neural foraminal stenosis. C5-6: Intermediate sized disc bulge with right-greater-than-left uncovertebral hypertrophy. Mild spinal canal stenosis. Severe right and mild left neural foraminal stenosis. C6-7: Small disc bulge. There is no spinal canal stenosis. Moderate bilateral neural foraminal stenosis. C7-T1: Normal disc space and facet joints. There is no spinal canal stenosis. No neural foraminal stenosis. IMPRESSION: 1. Diffuse edema throughout the right-sided paraspinal muscles with areas of irregular contrast enhancement, possibly indicating myositis. This extends inferiorly as far as the upper thoracic spine. 2. Mild spinal canal stenosis and severe right neural foraminal stenosis at C5-6. 3. Moderate bilateral C6-7 neural foraminal stenosis. Electronically Signed   By: Deatra Robinson M.D.   On: 12/10/2023 23:57   EEG adult Result Date: 12/09/2023 Charlsie Quest, MD     12/09/2023 11:53 AM Patient Name: MISHAL PROBERT MRN: [id number] Epilepsy Attending: Charlsie Quest Referring Physician/Provider: Jefferson Fuel, MD Date: 12/09/2023 Duration: 26.48 mins  Patient history: 63yo F with ams. EEG to evaluate for seizure Level of alertness: Awake AEDs during EEG study: None Technical aspects: This EEG study was done with scalp electrodes positioned according to the 10-20 International system of electrode placement. Electrical activity was reviewed with band pass filter of 1-70Hz , sensitivity of 7 uV/mm, display speed of 55mm/sec with a 60Hz  notched filter applied as appropriate. EEG data were recorded continuously and digitally stored.  Video monitoring was available and reviewed as appropriate. Description: The posterior dominant rhythm consists of 8 Hz activity of moderate voltage (25-35 uV) seen predominantly in posterior head regions, symmetric and reactive to eye opening and  eye closing. Hyperventilation and photic stimulation were not performed.   IMPRESSION: This study is within normal limits. No seizures or epileptiform discharges were seen throughout the recording. A normal interictal EEG does not exclude the diagnosis of epilepsy. Charlsie Quest   MR BRAIN WO CONTRAST Result Date: 12/09/2023 CLINICAL DATA:  Neuro deficit with acute stroke suspected. Right-sided weakness. EXAM: MRI HEAD WITHOUT CONTRAST TECHNIQUE: Multiplanar, multiecho pulse sequences of the brain and surrounding structures were obtained without intravenous contrast. COMPARISON:  Brain MRI from 4 days ago FINDINGS: Brain: No acute infarction, hemorrhage, hydrocephalus, extra-axial collection or mass lesion. Few small FLAIR hyperintensities in the cerebral white matter, commonly seen in healthy patients of this age. Brain volume is normal Vascular: Normal flow voids Skull and upper cervical spine: Normal marrow signal Sinuses/Orbits: No progression of mild paranasal sinus opacification with secretions seen at the right sphenoid. IMPRESSION: Stable brain MRI.  No infarct or other explanation for symptoms. Electronically Signed   By: Tiburcio Pea M.D.   On: 12/09/2023 07:43   DG Chest  Port 1 View Result Date: 12/08/2023 CLINICAL DATA:  63 year old female with history of acute hypoxic respiratory failure. EXAM: PORTABLE CHEST 1 VIEW COMPARISON:  Chest x-ray 12/04/2023. FINDINGS: An endotracheal tube is in place with tip 2.9 cm above the carina. A nasogastric tube is seen extending into the stomach, however, the tip of the nasogastric tube extends below the lower margin of the image. Lung volumes are normal. No consolidative airspace disease. No pleural effusions. No pneumothorax. No pulmonary nodule or mass noted. Pulmonary vasculature and the cardiomediastinal silhouette are within normal limits. IMPRESSION: 1. Support apparatus, as above. 2. No radiographic evidence of acute cardiopulmonary disease. Electronically Signed   By: Trudie Reed M.D.   On: 12/08/2023 13:34   MR BRAIN W WO CONTRAST Result Date: 12/05/2023 CLINICAL DATA:  Mental status change, unknown cause EXAM: MRI HEAD WITHOUT AND WITH CONTRAST TECHNIQUE: Multiplanar, multiecho pulse sequences of the brain and surrounding structures were obtained without and with intravenous contrast. CONTRAST:  8mL GADAVIST GADOBUTROL 1 MMOL/ML IV SOLN COMPARISON:  CT head December 04, 2023. FINDINGS: Brain: No acute infarction, hemorrhage, hydrocephalus, extra-axial collection or mass lesion. No pathologic enhancement. Vascular: Major arterial flow voids are maintained at the skull base. Skull and upper cervical spine: Normal marrow signal. Sinuses/Orbits: Largely clear sinuses.  No acute orbital findings. Other: No mastoid effusions. IMPRESSION: Normal brain MRI. No acute abnormality. Electronically Signed   By: Feliberto Harts M.D.   On: 12/05/2023 23:01   DG Abd 1 View Result Date: 12/05/2023 CLINICAL DATA:  OG tube placement EXAM: ABDOMEN - 1 VIEW COMPARISON:  12/04/2023 FINDINGS: OG tube is in the mid stomach. IMPRESSION: OG tube in the stomach. Electronically Signed   By: Charlett Nose M.D.   On: 12/05/2023 18:01   CT Cervical  Spine Wo Contrast Result Date: 12/04/2023 CLINICAL DATA:  Found unresponsive, history of drug abuse EXAM: CT CERVICAL SPINE WITHOUT CONTRAST TECHNIQUE: Multidetector CT imaging of the cervical spine was performed without intravenous contrast. Multiplanar CT image reconstructions were also generated. RADIATION DOSE REDUCTION: This exam was performed according to the departmental dose-optimization program which includes automated exposure control, adjustment of the mA and/or kV according to patient size and/or use of iterative reconstruction technique. COMPARISON:  None Available. FINDINGS: Alignment: Alignment is grossly anatomic. Skull base and vertebrae: No acute fracture. No primary bone lesion or focal pathologic process. Soft tissues and spinal canal: No prevertebral fluid or swelling. No visible  canal hematoma. Disc levels: Moderate spondylosis at C5-6 and C6-7. Left predominant facet hypertrophy from C2-3 through C5-6, as well as at the C7-T1 level. Upper chest: Endotracheal tube and enteric catheter are visualized, inferior extents excluded by slice selection. Lung apices are clear. Other: Reconstructed images demonstrate no additional findings. IMPRESSION: 1. No acute cervical spine fracture. 2. Multilevel cervical degenerative changes as above. Electronically Signed   By: Sharlet Salina M.D.   On: 12/04/2023 17:40   CT CHEST ABDOMEN PELVIS WO CONTRAST Result Date: 12/04/2023 CLINICAL DATA:  Found unresponsive, agonal respirations, history of drug abuse, possible sepsis EXAM: CT CHEST, ABDOMEN AND PELVIS WITHOUT CONTRAST TECHNIQUE: Multidetector CT imaging of the chest, abdomen and pelvis was performed following the standard protocol without IV contrast. RADIATION DOSE REDUCTION: This exam was performed according to the departmental dose-optimization program which includes automated exposure control, adjustment of the mA and/or kV according to patient size and/or use of iterative reconstruction  technique. COMPARISON:  10/07/2023 FINDINGS: CT CHEST FINDINGS Cardiovascular: Unenhanced imaging of the heart is unremarkable without pericardial effusion. Normal caliber of the thoracic aorta. Atherosclerosis of the aortic arch. Evaluation of the vascular lumen limited without IV contrast. Mediastinum/Nodes: Endotracheal tube tip at carina, recommend retracting 2 cm. Enteric catheter extends into the gastric lumen, tip within the fundus. No pathologic adenopathy. Thyroid is unremarkable. Lungs/Pleura: Dependent atelectasis within the lower lobes. No airspace disease, effusion, or pneumothorax. The central airways are patent. Musculoskeletal: No acute or destructive bony abnormalities. Reconstructed images demonstrate no additional findings. CT ABDOMEN PELVIS FINDINGS Hepatobiliary: Unenhanced imaging of the liver demonstrates diffuse hepatic steatosis. Prior cholecystectomy. No biliary duct dilation. Pancreas: Unremarkable unenhanced appearance. Spleen: Unremarkable unenhanced appearance. Adrenals/Urinary Tract: Stable punctate nonobstructing right renal calculi. No left-sided calculi. No obstructive uropathy within either kidney. Foley catheter is seen within the bladder. No filling defects or bladder wall thickening. The adrenals are unremarkable. Stomach/Bowel: No bowel obstruction or ileus. Normal appendix right lower quadrant. No bowel wall thickening or inflammatory change. Enteric catheter tip within the gastric fundus. Vascular/Lymphatic: Aortic atherosclerosis. No enlarged abdominal or pelvic lymph nodes. Reproductive: Status post hysterectomy. No adnexal masses. Other: No free fluid or free intraperitoneal gas. No abdominal wall hernia. Musculoskeletal: No acute or destructive bony abnormalities. Stable spondylosis at the thoracolumbar junction. Reconstructed images demonstrate no additional findings. IMPRESSION: 1. Endotracheal tube with tip at carina, recommend retracting 2 cm. 2. Dependent bilateral  lower lobe atelectasis. No acute airspace disease. 3. No acute intra-abdominal or intrapelvic process. 4. Stable punctate nonobstructing right renal calculi. 5. Hepatic steatosis. 6.  Aortic Atherosclerosis (ICD10-I70.0). Electronically Signed   By: Sharlet Salina M.D.   On: 12/04/2023 17:38   CT Head Wo Contrast Result Date: 12/04/2023 CLINICAL DATA:  Found unresponsive, history of drug abuse EXAM: CT HEAD WITHOUT CONTRAST TECHNIQUE: Contiguous axial images were obtained from the base of the skull through the vertex without intravenous contrast. RADIATION DOSE REDUCTION: This exam was performed according to the departmental dose-optimization program which includes automated exposure control, adjustment of the mA and/or kV according to patient size and/or use of iterative reconstruction technique. COMPARISON:  10/07/2023 FINDINGS: Brain: No acute infarct or hemorrhage. Lateral ventricles and midline structures are unremarkable. No acute extra-axial fluid collections. No mass effect. Vascular: No hyperdense vessel or unexpected calcification. Skull: Normal. Negative for fracture or focal lesion. Sinuses/Orbits: No acute finding. Other: None. IMPRESSION: 1. No acute intracranial process. Electronically Signed   By: Sharlet Salina M.D.   On: 12/04/2023 17:33   DG  Abd Portable 1 View Result Date: 12/04/2023 CLINICAL DATA:  OG tube placement EXAM: PORTABLE ABDOMEN - 1 VIEW limited for tube placement COMPARISON:  Noncontrast CT 10/07/2023. FINDINGS: Limited x-ray of the upper abdomen has enteric tube with tip overlying the fundus of the stomach. Side hole near the GE junction. This could be advanced further into the stomach. Minimal upper abdominal bowel gas. Surgical clips in the right upper quadrant. Curvature and degenerative changes of the spine. IMPRESSION: Limited x-ray for tube placement has enteric tube with tip overlying the fundus of the stomach but side hole at the GE junction. This could be advanced  further into the stomach. Electronically Signed   By: Karen Kays M.D.   On: 12/04/2023 16:08   DG Chest Portable 1 View Result Date: 12/04/2023 CLINICAL DATA:  Altered mental status EXAM: PORTABLE CHEST 1 VIEW COMPARISON:  X-ray 10/07/2023 FINDINGS: ET tube in place with tip CT 2 cm above the carina. Enteric tube place with side hole near the GE junction. This could be advanced further into the stomach. No consolidation, pneumothorax or effusion. No edema. Normal cardiopericardial silhouette. Tortuous ectatic aorta. Please see separate taken of CT examination from same day. IMPRESSION: Placement of ET tube and enteric tube. Side hole of the enteric tube is at GE junction. This could be advanced further into the stomach. Electronically Signed   By: Karen Kays M.D.   On: 12/04/2023 16:07    Microbiology: No results found for this or any previous visit (from the past 240 hours).   Labs: Basic Metabolic Panel: No results for input(s): "NA", "K", "CL", "CO2", "GLUCOSE", "BUN", "CREATININE", "CALCIUM", "MG", "PHOS" in the last 168 hours. Liver Function Tests: No results for input(s): "AST", "ALT", "ALKPHOS", "BILITOT", "PROT", "ALBUMIN" in the last 168 hours. No results for input(s): "LIPASE", "AMYLASE" in the last 168 hours. No results for input(s): "AMMONIA" in the last 168 hours. CBC: No results for input(s): "WBC", "NEUTROABS", "HGB", "HCT", "MCV", "PLT" in the last 168 hours. Cardiac Enzymes: No results for input(s): "CKTOTAL", "CKMB", "CKMBINDEX", "TROPONINI" in the last 168 hours. BNP: BNP (last 3 results) No results for input(s): "BNP" in the last 8760 hours.  ProBNP (last 3 results) No results for input(s): "PROBNP" in the last 8760 hours.  CBG: No results for input(s): "GLUCAP" in the last 168 hours.     Signed:  Silvano Bilis MD.  [facility] 01/02/2024, 8:35 AM

## 2024-01-02 NOTE — Plan of Care (Signed)
  Problem: Education: Goal: Knowledge of General Education information will improve Description: Including pain rating scale, medication(s)/side effects and non-pharmacologic comfort measures Outcome: Completed/Met   Problem: Health Behavior/Discharge Planning: Goal: Ability to manage health-related needs will improve Outcome: Completed/Met   Problem: Clinical Measurements: Goal: Ability to maintain clinical measurements within normal limits will improve Outcome: Completed/Met Goal: Will remain free from infection Outcome: Completed/Met Goal: Diagnostic test results will improve Outcome: Completed/Met Goal: Respiratory complications will improve Outcome: Completed/Met Goal: Cardiovascular complication will be avoided Outcome: Completed/Met   Problem: Activity: Goal: Risk for activity intolerance will decrease Outcome: Completed/Met   Problem: Nutrition: Goal: Adequate nutrition will be maintained Outcome: Completed/Met   Problem: Coping: Goal: Level of anxiety will decrease Outcome: Completed/Met   Problem: Elimination: Goal: Will not experience complications related to bowel motility Outcome: Completed/Met Goal: Will not experience complications related to urinary retention Outcome: Completed/Met   Problem: Elimination: Goal: Will not experience complications related to urinary retention Outcome: Completed/Met   Problem: Safety: Goal: Ability to remain free from injury will improve Outcome: Completed/Met   Problem: Skin Integrity: Goal: Risk for impaired skin integrity will decrease Outcome: Completed/Met

## 2024-01-03 ENCOUNTER — Telehealth: Payer: Self-pay | Admitting: Anesthesiology

## 2024-01-03 NOTE — Telephone Encounter (Signed)
 PT called stated that she is out of the hospital. PT stated that she did speak to Dr. [name] while she was in the hospital doesn't remember if [name] will be prescribe her medications. Please give patient a call. TY

## 2024-01-08 ENCOUNTER — Ambulatory Visit: Attending: Anesthesiology | Admitting: Anesthesiology

## 2024-01-08 ENCOUNTER — Encounter: Payer: Self-pay | Admitting: Anesthesiology

## 2024-01-08 DIAGNOSIS — M5442 Lumbago with sciatica, left side: Secondary | ICD-10-CM

## 2024-01-08 DIAGNOSIS — Z79891 Long term (current) use of opiate analgesic: Secondary | ICD-10-CM

## 2024-01-08 DIAGNOSIS — M5431 Sciatica, right side: Secondary | ICD-10-CM

## 2024-01-08 DIAGNOSIS — M25551 Pain in right hip: Secondary | ICD-10-CM | POA: Diagnosis not present

## 2024-01-08 DIAGNOSIS — M542 Cervicalgia: Secondary | ICD-10-CM

## 2024-01-08 DIAGNOSIS — F1190 Opioid use, unspecified, uncomplicated: Secondary | ICD-10-CM

## 2024-01-08 DIAGNOSIS — R29898 Other symptoms and signs involving the musculoskeletal system: Secondary | ICD-10-CM | POA: Insufficient documentation

## 2024-01-08 DIAGNOSIS — M545 Low back pain, unspecified: Secondary | ICD-10-CM

## 2024-01-08 DIAGNOSIS — Q675 Congenital deformity of spine: Secondary | ICD-10-CM

## 2024-01-08 DIAGNOSIS — G894 Chronic pain syndrome: Secondary | ICD-10-CM | POA: Diagnosis not present

## 2024-01-08 DIAGNOSIS — M549 Dorsalgia, unspecified: Secondary | ICD-10-CM

## 2024-01-08 DIAGNOSIS — G8929 Other chronic pain: Secondary | ICD-10-CM

## 2024-01-08 DIAGNOSIS — M5441 Lumbago with sciatica, right side: Secondary | ICD-10-CM

## 2024-01-08 MED ORDER — BACLOFEN 5 MG PO TABS: 5.0000 mg | ORAL_TABLET | Freq: Two times a day (BID) | ORAL | 2 refills | Status: AC

## 2024-01-08 MED ORDER — GABAPENTIN 300 MG PO CAPS: 300.0000 mg | ORAL_CAPSULE | Freq: Four times a day (QID) | ORAL | 2 refills | Status: DC

## 2024-01-08 NOTE — Progress Notes (Signed)
 Virtual Visit via Hopkins Note  I connected with Tami Hopkins on 01/08/24 at  4:00 PM EDT by Hopkins and verified that I am speaking with the correct person using two identifiers.  Location: Patient: Home Provider: Pain control center   I discussed the limitations, risks, security and privacy concerns of performing an evaluation and management service by Hopkins and the availability of in person appointments. I also discussed with the patient that there may be a patient responsible charge related to this service. The patient expressed understanding and agreed to proceed.   History of Present Illness: I spoke with Tami Hopkins as we are unable to link for the video portion of the conference.  She is currently staying in a hotel following her recent hospitalization.  She had an aspiration pneumonia and is suffering from some right upper extremity weakness following a recent drug overdose.  At present, she is reportedly doing much better and has been gaining some strength and return of function to the right upper extremity as she was found down and laying on that right side for a extended period of time.  She is currently taking her gabapentin and has been using baclofen effectively for muscle spasm and pain.  She still getting a lot of pain going down the right lateral leg into the calf with sciatica-like symptoms.  She has chronic back pain additionally.  Her neck pain has been stable and she does suffer from the right upper extremity weakness.  Otherwise she feels she is recovering well.  Review of systems: General: No fevers or chills Pulmonary: No shortness of breath or dyspnea Cardiac: No angina or palpitations or lightheadedness GI: No abdominal pain or constipation Psych: No depression    Observations/Objective:  Current Outpatient Medications:    albuterol (VENTOLIN HFA) 108 (90 Base) MCG/ACT inhaler, Inhale 2 puffs into the lungs every 6 (six) hours as  needed for wheezing or shortness of breath., Disp: 6.7 g, Rfl: 2   Baclofen 5 MG TABS, Take 1 tablet (5 mg total) by mouth 2 (two) times daily., Disp: 60 tablet, Rfl: 2   bisacodyl (DULCOLAX) 5 MG EC tablet, Take 2 tablets (10 mg total) by mouth at bedtime as needed for moderate constipation., Disp: 30 tablet, Rfl: 0   busPIRone (BUSPAR) 10 MG tablet, Take 1 tablet (10 mg total) by mouth 3 (three) times daily., Disp: 90 tablet, Rfl: 1   clonazePAM (KLONOPIN) 1 MG tablet, Take 1 tablet (1 mg total) by mouth 2 (two) times daily as needed., Disp: 60 tablet, Rfl: 0   diclofenac Sodium (VOLTAREN) 1 % GEL, Apply 2 g topically 3 (three) times daily., Disp: 100 g, Rfl: 0   fluticasone (FLONASE) 50 MCG/ACT nasal spray, Place 1 spray into both nostrils 2 (two) times daily., Disp: , Rfl:    folic acid (FOLVITE) 1 MG tablet, Take 1 tablet (1 mg total) by mouth daily., Disp: 30 tablet, Rfl: 0   gabapentin (NEURONTIN) 300 MG capsule, Take 1 capsule (300 mg total) by mouth 4 (four) times daily., Disp: 120 capsule, Rfl: 2   ipratropium (ATROVENT) 0.06 % nasal spray, Place into the nose., Disp: , Rfl:    lidocaine (LMX) 4 % cream, Apply topically daily as needed (pain)., Disp: 28 g, Rfl: 0   montelukast (SINGULAIR) 10 MG tablet, Take 1 tablet (10 mg total) by mouth at bedtime., Disp: 30 tablet, Rfl: 1   oxyCODONE (OXY IR/ROXICODONE) 5 MG immediate release tablet, Take 1 tablet (5 mg  total) by mouth every 6 (six) hours as needed for moderate pain (pain score 4-6)., Disp: 30 tablet, Rfl: 0   pantoprazole (PROTONIX) 40 MG tablet, Take 1 tablet (40 mg total) by mouth daily., Disp: 30 tablet, Rfl: 1   polyethylene glycol powder (GLYCOLAX/MIRALAX) 17 GM/SCOOP powder, Take 17 g by mouth 2 (two) times daily., Disp: 238 g, Rfl: 0   prazosin (MINIPRESS) 1 MG capsule, Take 1 capsule (1 mg total) by mouth at bedtime., Disp: 30 capsule, Rfl: 1   promethazine (PHENERGAN) 25 MG tablet, Take 25 mg by mouth every 6 (six) hours as  needed., Disp: , Rfl:    QUEtiapine (SEROQUEL) 100 MG tablet, Take 1 tablet (100 mg total) by mouth daily., Disp: 30 tablet, Rfl: 1   QUEtiapine (SEROQUEL) 200 MG tablet, Take 1 tablet (200 mg total) by mouth at bedtime., Disp: 30 tablet, Rfl: 1   Past Medical History:  Diagnosis Date   Anxiety    Asthma    Bursitis of hip bilateral    Hyperlipidemia    PTSD (post-traumatic stress disorder)    Scoliosis    Assessment and Plan:  1. Bilateral sciatica   2. Chronic hip pain, right   3. Back pain at L4-L5 level   4. Chronic, continuous use of opioids   5. Chronic right-sided low back pain with right-sided sciatica   6. Chronic pain syndrome   7. Congenital scoliosis   8. Cervicalgia   9. Musculoskeletal back pain   10. Right arm weakness    Tami Hopkins continues to recover from her recent hospitalization and I feel she is making good headway.  We have had a long conversation regarding behavioral feedback and behavioral modification.  She is currently working with a therapy pet and feels that this is also helping her.  I encouraged her to continue follow-up with her psychiatry physicians and primary care physicians for baseline medical care.  In regards to opioid management, as discussed with her in the hospital this needs to be discontinued.  She understands this.  At present we will defer on any repeat injections however at the 19-month mark she may be a candidate for repeat epidural.  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  I think it is fine to continue with gabapentin at 300 mg in the morning afternoon and 600 mg at night as this was helping with a lot of the neuralgic pain that has been problematic for her.  Additionally she can continue with the baclofen 5 mg twice a day for muscle spasms as needed.  Schedule her for return to clinic in 3  months.  If she needs further assistance or has problems with her medication she is instructed to contact us for earlier evaluation.  I have reiterated the importance of her continue follow-up with her primary care physicians and psychologist for baseline management. Follow Up Instructions:    I discussed the assessment and treatment plan with the patient. The patient was provided an opportunity to ask questions and all were answered. The patient agreed with the plan and demonstrated an understanding of the instructions.   The patient was advised to call back or seek an in-person evaluation if the symptoms worsen or if the condition fails to improve as anticipated.  I provided 30 minutes of non-face-to-face time during this encounter.   Fayrene Fearing  Clemencia Course, MD

## 2024-01-08 NOTE — Patient Instructions (Signed)

## 2024-01-28 ENCOUNTER — Telehealth: Payer: Self-pay | Admitting: Anesthesiology

## 2024-01-28 NOTE — Telephone Encounter (Signed)
 Having right sided foot pain and right arm pain, can she get injections in her right foot.

## 2024-02-06 NOTE — Telephone Encounter (Signed)
 Dr Welton Hall, Tami Hopkins is asking for injections. We will need to get insurance authorization for this before we can schedule her. Please let me know if we can do this and what kind of injections.

## 2024-03-02 NOTE — Telephone Encounter (Signed)
 I have authorization, unable to reach patient

## 2024-03-03 ENCOUNTER — Ambulatory Visit: Attending: Anesthesiology | Admitting: Anesthesiology

## 2024-03-03 ENCOUNTER — Telehealth: Payer: Self-pay

## 2024-03-03 ENCOUNTER — Encounter: Payer: Self-pay | Admitting: Anesthesiology

## 2024-03-03 DIAGNOSIS — G894 Chronic pain syndrome: Secondary | ICD-10-CM

## 2024-03-03 DIAGNOSIS — M545 Low back pain, unspecified: Secondary | ICD-10-CM

## 2024-03-03 DIAGNOSIS — Q675 Congenital deformity of spine: Secondary | ICD-10-CM | POA: Diagnosis present

## 2024-03-03 DIAGNOSIS — G8929 Other chronic pain: Secondary | ICD-10-CM | POA: Diagnosis present

## 2024-03-03 DIAGNOSIS — M25551 Pain in right hip: Secondary | ICD-10-CM | POA: Insufficient documentation

## 2024-03-03 DIAGNOSIS — M5431 Sciatica, right side: Secondary | ICD-10-CM | POA: Diagnosis present

## 2024-03-03 DIAGNOSIS — M549 Dorsalgia, unspecified: Secondary | ICD-10-CM

## 2024-03-03 DIAGNOSIS — F1190 Opioid use, unspecified, uncomplicated: Secondary | ICD-10-CM | POA: Diagnosis present

## 2024-03-03 DIAGNOSIS — M5432 Sciatica, left side: Secondary | ICD-10-CM | POA: Insufficient documentation

## 2024-03-03 DIAGNOSIS — M5136 Other intervertebral disc degeneration, lumbar region with discogenic back pain only: Secondary | ICD-10-CM

## 2024-03-03 DIAGNOSIS — M542 Cervicalgia: Secondary | ICD-10-CM | POA: Diagnosis present

## 2024-03-03 DIAGNOSIS — Z79891 Long term (current) use of opiate analgesic: Secondary | ICD-10-CM

## 2024-03-03 DIAGNOSIS — M5441 Lumbago with sciatica, right side: Secondary | ICD-10-CM | POA: Insufficient documentation

## 2024-03-03 NOTE — Telephone Encounter (Signed)
 Telephone call from patient wanting to update her pharmacy. Patient will now be using [facility] [place]. Updated in the system

## 2024-03-04 ENCOUNTER — Telehealth: Payer: Self-pay

## 2024-03-04 NOTE — Telephone Encounter (Signed)
 She wants to see if Dr. Welton Hall will increase her gabapentin . She forgot to tell him yesterday on her phone call appt.

## 2024-03-04 NOTE — Progress Notes (Signed)
 Virtual Visit via Telephone Note  I connected with Tami Hopkins on 03/04/24 at  3:40 PM EDT by telephone and verified that I am speaking with the correct person using two identifiers.  Location: Patient: Home Provider: Pain control center   I discussed the limitations, risks, security and privacy concerns of performing an evaluation and management service by telephone and the availability of in person appointments. I also discussed with the patient that there may be a patient responsible charge related to this service. The patient expressed understanding and agreed to proceed.   History of Present Illness: I spoke with Tami Hopkins via telephone as we could not link for the video portion of the conference.  She reports that she is still suffering from some left upper extremity weakness following her recent injury and has recovered well from her stroke and aspiration pneumonia.  She is still suffering from a considerable amount of low back pain with bilateral hip pain and ongoing bilateral lower extremity sciatica described as a gnawing aching pain radiating into both calves left greater than right but no associated weakness is reported in the legs.  Bowel bladder function has been stable.  In the distant past she reports that she has had previous epidural steroid injections for her low back pain and desires to present to clinic for epidural steroid injection secondary to the severity of the pain.  She has tried conservative therapy including physical therapy exercise modalities without success and she is not a candidate for chronic opioid therapy.  She has been on anti-inflammatory medications in the past with limited success as well.  Review of systems: General: No fevers or chills Pulmonary: No shortness of breath or dyspnea Cardiac: No angina or palpitations or lightheadedness GI: No abdominal pain or constipation Psych: No depression    Observations/Objective:  Current Outpatient  Medications:    albuterol  (VENTOLIN  HFA) 108 (90 Base) MCG/ACT inhaler, Inhale 2 puffs into the lungs every 6 (six) hours as needed for wheezing or shortness of breath., Disp: 6.7 g, Rfl: 2   Baclofen  5 MG TABS, Take 1 tablet (5 mg total) by mouth 2 (two) times daily., Disp: 60 tablet, Rfl: 2   bisacodyl  (DULCOLAX) 5 MG EC tablet, Take 2 tablets (10 mg total) by mouth at bedtime as needed for moderate constipation., Disp: 30 tablet, Rfl: 0   busPIRone  (BUSPAR ) 10 MG tablet, Take 1 tablet (10 mg total) by mouth 3 (three) times daily., Disp: 90 tablet, Rfl: 1   clonazePAM  (KLONOPIN ) 1 MG tablet, Take 1 tablet (1 mg total) by mouth 2 (two) times daily as needed., Disp: 60 tablet, Rfl: 0   diclofenac  Sodium (VOLTAREN ) 1 % GEL, Apply 2 g topically 3 (three) times daily., Disp: 100 g, Rfl: 0   fluticasone  (FLONASE ) 50 MCG/ACT nasal spray, Place 1 spray into both nostrils 2 (two) times daily., Disp: , Rfl:    folic acid  (FOLVITE ) 1 MG tablet, Take 1 tablet (1 mg total) by mouth daily., Disp: 30 tablet, Rfl: 0   gabapentin  (NEURONTIN ) 300 MG capsule, Take 1 capsule (300 mg total) by mouth 4 (four) times daily., Disp: 120 capsule, Rfl: 2   ipratropium (ATROVENT) 0.06 % nasal spray, Place into the nose., Disp: , Rfl:    lidocaine  (LMX) 4 % cream, Apply topically daily as needed (pain)., Disp: 28 g, Rfl: 0   montelukast  (SINGULAIR ) 10 MG tablet, Take 1 tablet (10 mg total) by mouth at bedtime., Disp: 30 tablet, Rfl: 1   oxyCODONE  (  OXY IR/ROXICODONE ) 5 MG immediate release tablet, Take 1 tablet (5 mg total) by mouth every 6 (six) hours as needed for moderate pain (pain score 4-6)., Disp: 30 tablet, Rfl: 0   pantoprazole  (PROTONIX ) 40 MG tablet, Take 1 tablet (40 mg total) by mouth daily., Disp: 30 tablet, Rfl: 1   polyethylene glycol powder (GLYCOLAX /MIRALAX ) 17 GM/SCOOP powder, Take 17 g by mouth 2 (two) times daily., Disp: 238 g, Rfl: 0   prazosin  (MINIPRESS ) 1 MG capsule, Take 1 capsule (1 mg total) by mouth  at bedtime., Disp: 30 capsule, Rfl: 1   promethazine (PHENERGAN) 25 MG tablet, Take 25 mg by mouth every 6 (six) hours as needed., Disp: , Rfl:    QUEtiapine  (SEROQUEL ) 100 MG tablet, Take 1 tablet (100 mg total) by mouth daily., Disp: 30 tablet, Rfl: 1   QUEtiapine  (SEROQUEL ) 200 MG tablet, Take 1 tablet (200 mg total) by mouth at bedtime., Disp: 30 tablet, Rfl: 1  MRI examination of the lumbar spine  Assessment and Plan: 1. Bilateral sciatica   2. Chronic hip pain, right   3. Back pain at L4-L5 level   4. Chronic, continuous use of opioids   5. Chronic right-sided low back pain with right-sided sciatica   6. Chronic pain syndrome   7. Congenital scoliosis   8. Cervicalgia   9. Musculoskeletal back pain   10. Degeneration of intervertebral disc of lumbar region with discogenic back pain    Based on our conversation I think it is appropriate to schedule her for an epidural steroid injection at the next available date likely in the next month.  We gone over the risk benefits of the procedure with her in full detail.  She remains with severe low back pain that is unremitting with sciatica symptoms.  She has had this in the past.  We gone over the risks and benefits of the procedure with her in full detail all questions were answered.  I encouraged her to continue follow-up with her neurologist and primary care physicians for ongoing medical problems and hopefully the epidural will help with some of the sciatica symptoms and enable her to be more active and fulfill some of her physical therapy core strengthening exercises for longer-term management.  Will do this at the next available date.  Follow Up Instructions:    I discussed the assessment and treatment plan with the patient. The patient was provided an opportunity to ask questions and all were answered. The patient agreed with the plan and demonstrated an understanding of the instructions.   The patient was advised to call back or seek  an in-person evaluation if the symptoms worsen or if the condition fails to improve as anticipated.  I provided 30 minutes of non-face-to-face time during this encounter.   Zula Hitch, MD

## 2024-03-10 ENCOUNTER — Encounter (INDEPENDENT_AMBULATORY_CARE_PROVIDER_SITE_OTHER): Payer: Self-pay

## 2024-03-17 ENCOUNTER — Other Ambulatory Visit: Payer: Self-pay | Admitting: Family Medicine

## 2024-03-17 DIAGNOSIS — Z1231 Encounter for screening mammogram for malignant neoplasm of breast: Secondary | ICD-10-CM

## 2024-03-18 NOTE — Telephone Encounter (Signed)
 Patient informed of this per voicemail.

## 2024-03-31 ENCOUNTER — Telehealth: Payer: Self-pay

## 2024-03-31 NOTE — Telephone Encounter (Signed)
 Telephone call from patient asking for a letter to help her get a Low-income apartment. She states that she needs extra space for her equipment. She has weights  bands, gazelle, and a rowing machine. Patient states that if she had documentation showing this equipment and her needs, she could get a bigger space. Encouraged patient to also reach out to her PCP, however, patient states that Dr. Welton Hall knew what was going on better.

## 2024-04-01 ENCOUNTER — Other Ambulatory Visit: Payer: Self-pay | Admitting: Anesthesiology

## 2024-04-06 ENCOUNTER — Encounter

## 2024-04-06 ENCOUNTER — Ambulatory Visit: Attending: Anesthesiology | Admitting: Anesthesiology

## 2024-04-28 ENCOUNTER — Other Ambulatory Visit: Payer: Self-pay | Admitting: Anesthesiology

## 2024-04-28 ENCOUNTER — Encounter: Payer: Self-pay | Admitting: Anesthesiology

## 2024-04-28 ENCOUNTER — Ambulatory Visit: Attending: Anesthesiology | Admitting: Anesthesiology

## 2024-04-28 DIAGNOSIS — Z79891 Long term (current) use of opiate analgesic: Secondary | ICD-10-CM

## 2024-04-28 DIAGNOSIS — G894 Chronic pain syndrome: Secondary | ICD-10-CM

## 2024-04-28 DIAGNOSIS — M5441 Lumbago with sciatica, right side: Secondary | ICD-10-CM | POA: Diagnosis not present

## 2024-04-28 DIAGNOSIS — M5432 Sciatica, left side: Secondary | ICD-10-CM

## 2024-04-28 DIAGNOSIS — M25551 Pain in right hip: Secondary | ICD-10-CM | POA: Diagnosis not present

## 2024-04-28 DIAGNOSIS — M5431 Sciatica, right side: Secondary | ICD-10-CM

## 2024-04-28 DIAGNOSIS — M545 Low back pain, unspecified: Secondary | ICD-10-CM

## 2024-04-28 DIAGNOSIS — M549 Dorsalgia, unspecified: Secondary | ICD-10-CM

## 2024-04-28 DIAGNOSIS — G8929 Other chronic pain: Secondary | ICD-10-CM

## 2024-04-28 DIAGNOSIS — M542 Cervicalgia: Secondary | ICD-10-CM

## 2024-04-28 DIAGNOSIS — F1190 Opioid use, unspecified, uncomplicated: Secondary | ICD-10-CM

## 2024-04-28 DIAGNOSIS — M25512 Pain in left shoulder: Secondary | ICD-10-CM

## 2024-04-28 MED ORDER — GABAPENTIN 300 MG PO CAPS: 300.0000 mg | ORAL_CAPSULE | Freq: Four times a day (QID) | ORAL | 2 refills | Status: DC

## 2024-04-28 MED ORDER — BACLOFEN 10 MG PO TABS: 10.0000 mg | ORAL_TABLET | Freq: Three times a day (TID) | ORAL | 2 refills | Status: AC

## 2024-04-28 NOTE — Progress Notes (Unsigned)
 Virtual Visit via Telephone Note  I connected with Tami LITTIE Hopkins on 04/28/24 at  9:00 AM EDT by telephone and verified that I am speaking with the correct person using two identifiers.  Location: Patient: Home Provider: Pain control center   I discussed the limitations, risks, security and privacy concerns of performing an evaluation and management service by telephone and the availability of in person appointments. I also discussed with the patient that there may be a patient responsible charge related to this service. The patient expressed understanding and agreed to proceed.   History of Present Illness: I spoke with Tami Hopkins via telephone as we are not able to link for the video portion encounters.  She reports that she is having a lot of pain still radiating from the lower back into her leg.  This tends to be worse on the right than the left that she gets sciatica symptoms going to the calf and foot on both sides.  No weakness is reported.  She is also having some shoulder pain posteriorly on the left side.  This pain.  Also has been doing physical therapy and despite these the pain persists.  She is scheduled for epidural steroid injection at the end of the month.  Otherwise she is taking gabapentin  300 mg up to 4 times a day and has been on baclofen  10 mg 2 times a day and occasionally 3 times a day.  Both have worked very well to keep her pain under better control.  She previously was on opioids but this became problematic with use and tolerance.  Otherwise she is in her usual state of health with no new changes in the quality characteristic or distribution of the pain.  Review of systems: General: No fevers or chills Pulmonary: No shortness of breath or dyspnea Cardiac: No angina or palpitations or lightheadedness GI: No abdominal pain or constipation Psych: No depression    Observations/Objective:  Current Outpatient Medications:    baclofen  (LIORESAL ) 10 MG tablet, Take  1 tablet (10 mg total) by mouth 3 (three) times daily., Disp: 75 tablet, Rfl: 2   albuterol  (VENTOLIN  HFA) 108 (90 Base) MCG/ACT inhaler, Inhale 2 puffs into the lungs every 6 (six) hours as needed for wheezing or shortness of breath., Disp: 6.7 g, Rfl: 2   bisacodyl  (DULCOLAX) 5 MG EC tablet, Take 2 tablets (10 mg total) by mouth at bedtime as needed for moderate constipation., Disp: 30 tablet, Rfl: 0   busPIRone  (BUSPAR ) 10 MG tablet, Take 1 tablet (10 mg total) by mouth 3 (three) times daily., Disp: 90 tablet, Rfl: 1   clonazePAM  (KLONOPIN ) 1 MG tablet, Take 1 tablet (1 mg total) by mouth 2 (two) times daily as needed., Disp: 60 tablet, Rfl: 0   diclofenac  Sodium (VOLTAREN ) 1 % GEL, Apply 2 g topically 3 (three) times daily., Disp: 100 g, Rfl: 0   fluticasone  (FLONASE ) 50 MCG/ACT nasal spray, Place 1 spray into both nostrils 2 (two) times daily., Disp: , Rfl:    folic acid  (FOLVITE ) 1 MG tablet, Take 1 tablet (1 mg total) by mouth daily., Disp: 30 tablet, Rfl: 0   gabapentin  (NEURONTIN ) 300 MG capsule, Take 1 capsule (300 mg total) by mouth 4 (four) times daily., Disp: 120 capsule, Rfl: 2   ipratropium (ATROVENT) 0.06 % nasal spray, Place into the nose., Disp: , Rfl:    lidocaine  (LMX) 4 % cream, Apply topically daily as needed (pain)., Disp: 28 g, Rfl: 0   montelukast  (SINGULAIR ) 10  MG tablet, Take 1 tablet (10 mg total) by mouth at bedtime., Disp: 30 tablet, Rfl: 1   oxyCODONE  (OXY IR/ROXICODONE ) 5 MG immediate release tablet, Take 1 tablet (5 mg total) by mouth every 6 (six) hours as needed for moderate pain (pain score 4-6)., Disp: 30 tablet, Rfl: 0   pantoprazole  (PROTONIX ) 40 MG tablet, Take 1 tablet (40 mg total) by mouth daily., Disp: 30 tablet, Rfl: 1   polyethylene glycol powder (GLYCOLAX /MIRALAX ) 17 GM/SCOOP powder, Take 17 g by mouth 2 (two) times daily., Disp: 238 g, Rfl: 0   prazosin  (MINIPRESS ) 1 MG capsule, Take 1 capsule (1 mg total) by mouth at bedtime., Disp: 30 capsule, Rfl: 1    promethazine (PHENERGAN) 25 MG tablet, Take 25 mg by mouth every 6 (six) hours as needed., Disp: , Rfl:    QUEtiapine  (SEROQUEL ) 100 MG tablet, Take 1 tablet (100 mg total) by mouth daily., Disp: 30 tablet, Rfl: 1   QUEtiapine  (SEROQUEL ) 200 MG tablet, Take 1 tablet (200 mg total) by mouth at bedtime., Disp: 30 tablet, Rfl: 1   Past Medical History:  Diagnosis Date   Anxiety    Asthma    Bursitis of hip bilateral    Hyperlipidemia    PTSD (post-traumatic stress disorder)    Scoliosis    Assessment and Plan:  1. Bilateral sciatica   2. Chronic hip pain, right   3. Back pain at L4-L5 level   4. Chronic, continuous use of opioids   5. Chronic right-sided low back pain with right-sided sciatica   6. Chronic pain syndrome   7. Cervicalgia   8. Musculoskeletal back pain   9. Pain in joint of left shoulder    Based on our conversation I think it is appropriate to refill her gabapentin  for 3 times daily up to 4 times daily dosing as this is working well for the sciatica persistent symptoms and spasming.  Additionally I am going to refill her baclofen  for twice a day dosing and on her worst days she can use this 3 times a day.  She has been counseled regarding spacing these out to avoid excess sedation though she maintains that this has not been a problem.  Additionally continue with stretching strengthening exercises which we have gone over.  Will schedule her for the epidural which she is already planned for at the end of the month.  We talked about the risks and benefits of the procedure and hopefully this will give her good therapeutic relief and resolve some of her sciatica symptoms which have been quite recalcitrant.  Continue follow-up with her primary care physicians for baseline medical care and follow-up with us  as well. Follow Up Instructions:    I discussed the assessment and treatment plan with the patient. The patient was provided an opportunity to ask questions and all were  answered. The patient agreed with the plan and demonstrated an understanding of the instructions.   The patient was advised to call back or seek an in-person evaluation if the symptoms worsen or if the condition fails to improve as anticipated.  I provided 30 minutes of non-face-to-face time during this encounter.   Lynwood KANDICE Clause, MD

## 2024-05-18 ENCOUNTER — Ambulatory Visit
Admission: RE | Admit: 2024-05-18 | Discharge: 2024-05-18 | Disposition: A | Discharge status: Home / Self Care | Source: Ambulatory Visit | Attending: Family Medicine | Admitting: Family Medicine

## 2024-05-18 ENCOUNTER — Encounter: Payer: Self-pay | Admitting: Anesthesiology

## 2024-05-18 ENCOUNTER — Other Ambulatory Visit: Payer: Self-pay | Admitting: Anesthesiology

## 2024-05-18 ENCOUNTER — Telehealth: Payer: Self-pay | Admitting: Anesthesiology

## 2024-05-18 ENCOUNTER — Ambulatory Visit: Admitting: Anesthesiology

## 2024-05-18 ENCOUNTER — Ambulatory Visit
Admission: RE | Admit: 2024-05-18 | Discharge: 2024-05-18 | Disposition: A | Discharge status: Home / Self Care | Source: Ambulatory Visit | Attending: Anesthesiology | Admitting: Anesthesiology

## 2024-05-18 VITALS — BP 112/74 | HR 98 | Temp 98.2°F | Resp 18 | Ht 67.0 in | Wt 170.0 lb

## 2024-05-18 DIAGNOSIS — Q675 Congenital deformity of spine: Secondary | ICD-10-CM | POA: Diagnosis present

## 2024-05-18 DIAGNOSIS — M5431 Sciatica, right side: Secondary | ICD-10-CM

## 2024-05-18 DIAGNOSIS — G894 Chronic pain syndrome: Secondary | ICD-10-CM

## 2024-05-18 DIAGNOSIS — M25551 Pain in right hip: Secondary | ICD-10-CM | POA: Diagnosis present

## 2024-05-18 DIAGNOSIS — M5432 Sciatica, left side: Secondary | ICD-10-CM

## 2024-05-18 DIAGNOSIS — M542 Cervicalgia: Secondary | ICD-10-CM | POA: Diagnosis present

## 2024-05-18 DIAGNOSIS — G8929 Other chronic pain: Secondary | ICD-10-CM | POA: Diagnosis present

## 2024-05-18 DIAGNOSIS — Z1231 Encounter for screening mammogram for malignant neoplasm of breast: Secondary | ICD-10-CM | POA: Insufficient documentation

## 2024-05-18 DIAGNOSIS — M545 Low back pain, unspecified: Secondary | ICD-10-CM

## 2024-05-18 DIAGNOSIS — M5441 Lumbago with sciatica, right side: Secondary | ICD-10-CM | POA: Diagnosis present

## 2024-05-18 MED ORDER — DEXAMETHASONE SODIUM PHOSPHATE 10 MG/ML IJ SOLN
INTRAMUSCULAR | Status: AC
  Filled 2024-05-18: qty 1

## 2024-05-18 MED ORDER — TRIAMCINOLONE ACETONIDE 40 MG/ML IJ SUSP
40.0000 mg | Freq: Once | INTRAMUSCULAR | Status: AC
  Administered 2024-05-18: 40 mg
  Filled 2024-05-18: qty 1

## 2024-05-18 MED ORDER — LACTATED RINGERS IV SOLN: INTRAVENOUS | Status: DC

## 2024-05-18 MED ORDER — IOHEXOL 180 MG/ML  SOLN
10.0000 mL | Freq: Once | INTRAMUSCULAR | Status: AC | PRN
  Administered 2024-05-18: 10 mL via EPIDURAL
  Filled 2024-05-18: qty 20

## 2024-05-18 MED ORDER — SODIUM CHLORIDE 0.9% FLUSH
10.0000 mL | Freq: Once | INTRAVENOUS | Status: AC
  Administered 2024-05-18: 10 mL

## 2024-05-18 MED ORDER — ROPIVACAINE HCL 2 MG/ML IJ SOLN
10.0000 mL | Freq: Once | INTRAMUSCULAR | Status: AC
  Administered 2024-05-18: 10 mL via EPIDURAL
  Filled 2024-05-18: qty 20

## 2024-05-18 MED ORDER — LIDOCAINE HCL (PF) 1 % IJ SOLN
5.0000 mL | Freq: Once | INTRAMUSCULAR | Status: AC
  Administered 2024-05-18: 5 mL via SUBCUTANEOUS
  Filled 2024-05-18: qty 5

## 2024-05-18 MED ORDER — MIDAZOLAM HCL 2 MG/2ML IJ SOLN: 2.0000 mg | Freq: Once | INTRAMUSCULAR | Status: DC

## 2024-05-18 NOTE — Patient Instructions (Signed)

## 2024-05-18 NOTE — Telephone Encounter (Signed)
 PT would like to know if Myra will write up a letter stating that she will need a two bedroom apartment to accommodate her. Due to her doing physical therapy. PT also asked if the letter could be email to her or put in her my chart. Please give patient a call. TY

## 2024-05-20 ENCOUNTER — Encounter: Payer: Self-pay | Admitting: Anesthesiology

## 2024-05-20 NOTE — Progress Notes (Signed)
 Subjective:  Patient ID: Tami Hopkins, female    DOB: April 20, 1961  Age: 63 y.o. MRN: [id number]  CC: Back Pain (lower)   Procedure: L5-S1 epidural steroid under fluoroscopic guidance with no sedation as well as trigger point injection to the posterior left trapezius and the left anterior deltoid  HPI Tami Hopkins presents for reevaluation.  [name] continues to have problems with left greater than right posterior lateral leg sciatica that has been very restricted for her.  She continues to have some numbness tingling and calf cramping primarily left greater than right although at times it is on both sides.  The pain limits her activity and is worse with certain activities but stable in nature with no recent change in lower extremity strength or function though occasionally she does have some left lower leg giveway weakness.  No bowel or bladder dysfunction as mentioned today.  Additionally she is having persistent left shoulder pain primarily in the left posterior trapezius muscle and anterior shoulder which has also been somewhat restricted for her.  Despite conservative measures of stretching physical therapy and heat and ice cold application this has not gotten better and she is requesting a trigger point injection for this today.  Outpatient Medications Prior to Visit  Medication Sig Dispense Refill   albuterol  (VENTOLIN  HFA) 108 (90 Base) MCG/ACT inhaler Inhale 2 puffs into the lungs every 6 (six) hours as needed for wheezing or shortness of breath. 6.7 g 2   baclofen  (LIORESAL ) 10 MG tablet Take 1 tablet (10 mg total) by mouth 3 (three) times daily. 75 tablet 2   bisacodyl  (DULCOLAX) 5 MG EC tablet Take 2 tablets (10 mg total) by mouth at bedtime as needed for moderate constipation. 30 tablet 0   busPIRone  (BUSPAR ) 10 MG tablet Take 1 tablet (10 mg total) by mouth 3 (three) times daily. 90 tablet 1   clonazePAM  (KLONOPIN ) 1 MG tablet Take 1 tablet (1 mg total) by mouth 2 (two)  times daily as needed. 60 tablet 0   diclofenac  Sodium (VOLTAREN ) 1 % GEL Apply 2 g topically 3 (three) times daily. 100 g 0   fluticasone  (FLONASE ) 50 MCG/ACT nasal spray Place 1 spray into both nostrils 2 (two) times daily.     folic acid  (FOLVITE ) 1 MG tablet Take 1 tablet (1 mg total) by mouth daily. 30 tablet 0   gabapentin  (NEURONTIN ) 300 MG capsule Take 1 capsule (300 mg total) by mouth 4 (four) times daily. 120 capsule 2   ipratropium (ATROVENT) 0.06 % nasal spray Place into the nose.     lidocaine  (LMX) 4 % cream Apply topically daily as needed (pain). 28 g 0   montelukast  (SINGULAIR ) 10 MG tablet Take 1 tablet (10 mg total) by mouth at bedtime. 30 tablet 1   pantoprazole  (PROTONIX ) 40 MG tablet Take 1 tablet (40 mg total) by mouth daily. 30 tablet 1   polyethylene glycol powder (GLYCOLAX /MIRALAX ) 17 GM/SCOOP powder Take 17 g by mouth 2 (two) times daily. 238 g 0   prazosin  (MINIPRESS ) 1 MG capsule Take 1 capsule (1 mg total) by mouth at bedtime. 30 capsule 1   promethazine (PHENERGAN) 25 MG tablet Take 25 mg by mouth every 6 (six) hours as needed.     QUEtiapine  (SEROQUEL ) 100 MG tablet Take 1 tablet (100 mg total) by mouth daily. 30 tablet 1   QUEtiapine  (SEROQUEL ) 200 MG tablet Take 1 tablet (200 mg total) by mouth at bedtime. 30 tablet 1   oxyCODONE  (  OXY IR/ROXICODONE ) 5 MG immediate release tablet Take 1 tablet (5 mg total) by mouth every 6 (six) hours as needed for moderate pain (pain score 4-6). (Patient not taking: Reported on 05/18/2024) 30 tablet 0   No facility-administered medications prior to visit.    Review of Systems CNS: No confusion or sedation Cardiac: No angina or palpitations GI: No abdominal pain or constipation Constitutional: No nausea vomiting fevers or chills  Objective:  BP 112/74   Pulse 98   Temp 98.2 F (36.8 C) (Temporal)   Resp 18   Ht 5' 7 (1.702 m)   Wt 170 lb (77.1 kg)   SpO2 97%   BMI 26.63 kg/m    BP Readings from Last 3 Encounters:   05/18/24 112/74  11/21/23 (!) 125/108  10/14/23 (!) 148/102     Wt Readings from Last 3 Encounters:  05/18/24 170 lb (77.1 kg)  11/21/23 178 lb (80.7 kg)  10/14/23 202 lb 6.1 oz (91.8 kg)     Physical Exam Pt is alert and oriented PERRL EOMI HEART IS RRR no murmur or rub LCTA no wheezing or rales MUSCULOSKELETAL reveals some paraspinous muscle tenderness in the lumbar region but no overt trigger points.  She does have a positive straight leg raise on the left side negative on the right.  She is walking with a mildly antalgic gait.SABRA  Also tone and bulk is at baseline.  In respect to the left anterior shoulder there is a pinpoint tenderness right over the cortical brachial insertion as well as the mid body left trapezius.  Muscle tone and bulk is good and upper extremity strength appears to be intact  Labs  No results found for: HGBA1C Lab Results  Component Value Date   CREATININE 0.67 12/23/2023    -------------------------------------------------------------------------------------------------------------------- Lab Results  Component Value Date   WBC 5.8 12/23/2023   HGB 12.3 12/23/2023   HCT 37.3 12/23/2023   PLT 316 12/23/2023   GLUCOSE 132 (H) 12/23/2023   TRIG 199 (H) 12/06/2023   ALT 61 (H) 12/04/2023   AST 118 (H) 12/04/2023   NA 138 12/23/2023   K 3.9 12/23/2023   CL 104 12/23/2023   CREATININE 0.67 12/23/2023   BUN 22 12/23/2023   CO2 26 12/23/2023   TSH 0.912 05/11/2015   INR 1.1 12/04/2023    --------------------------------------------------------------------------------------------------------------------- DG PAIN CLINIC C-ARM 1-60 MIN NO REPORT Result Date: 05/18/2024 Fluoro was used, but no Radiologist interpretation will be provided. Please refer to NOTES tab for provider progress note.    Assessment & Plan:   Crescentia was seen today for back pain.  Diagnoses and all orders for this visit:  Bilateral sciatica  Chronic hip pain,  right  Back pain at L4-L5 level  Chronic right-sided low back pain with right-sided sciatica  Chronic pain syndrome  Cervicalgia  Congenital scoliosis  Other orders -     triamcinolone  acetonide (KENALOG -40) injection 40 mg -     sodium chloride  flush (NS) 0.9 % injection 10 mL -     ropivacaine  (PF) 2 mg/mL (0.2%) (NAROPIN ) injection 10 mL -     midazolam  (VERSED ) injection 2 mg -     lidocaine  (PF) (XYLOCAINE ) 1 % injection 5 mL -     iohexol  (OMNIPAQUE ) 180 MG/ML injection 10 mL -     lactated ringers  infusion        ----------------------------------------------------------------------------------------------------------------------  Problem List Items Addressed This Visit       Unprioritized   Chronic hip pain, right (  Chronic)   Chronic pain syndrome (Chronic)   Chronic right-sided low back pain with right-sided sciatica (Chronic)   Relevant Medications   midazolam  (VERSED ) injection 2 mg   Congenital scoliosis (Chronic)   Cervicalgia   Other Visit Diagnoses       Bilateral sciatica    -  Primary   Relevant Medications   midazolam  (VERSED ) injection 2 mg     Back pain at L4-L5 level       Relevant Medications   triamcinolone  acetonide (KENALOG -40) injection 40 mg (Completed)         ----------------------------------------------------------------------------------------------------------------------  1. Bilateral sciatica (Primary) Will proceed with an epidural steroid injection per patient request.  The risks and benefits of and reviewed all questions answered.  I want her to continue with conservative care in addition to daily stretching strengthening and increase activity as tolerated.  2. Chronic hip pain, right As above.  We have talked about having her see an orthopedist if the hip pain persists  3. Back pain at L4-L5 level As above  4. Chronic right-sided low back pain with right-sided sciatica As above.  We have talked about possibly  repeat epidural in 1 month contingent on her response to therapy today.  5. Chronic pain syndrome   6. Cervicalgia Continue stretching strengthening exercises as reviewed with trigger point injection today.  We talked about the risks and benefits of trigger point injection and she understands these desires to proceed with this today.  7. Congenital scoliosis As above    ----------------------------------------------------------------------------------------------------------------------  I am having Pamelyn L. Parks maintain her fluticasone , ipratropium, folic acid , promethazine, bisacodyl , polyethylene glycol powder, albuterol , diclofenac  Sodium, oxyCODONE , prazosin , busPIRone , QUEtiapine , QUEtiapine , pantoprazole , clonazePAM , montelukast , lidocaine , baclofen , and gabapentin . We administered triamcinolone  acetonide, sodium chloride  flush, ropivacaine  (PF) 2 mg/mL (0.2%), lidocaine  (PF), and iohexol .   Meds ordered this encounter  Medications   triamcinolone  acetonide (KENALOG -40) injection 40 mg   sodium chloride  flush (NS) 0.9 % injection 10 mL   ropivacaine  (PF) 2 mg/mL (0.2%) (NAROPIN ) injection 10 mL   midazolam  (VERSED ) injection 2 mg   lidocaine  (PF) (XYLOCAINE ) 1 % injection 5 mL   iohexol  (OMNIPAQUE ) 180 MG/ML injection 10 mL   lactated ringers  infusion   Patient's Medications  New Prescriptions   No medications on file  Previous Medications   ALBUTEROL  (VENTOLIN  HFA) 108 (90 BASE) MCG/ACT INHALER    Inhale 2 puffs into the lungs every 6 (six) hours as needed for wheezing or shortness of breath.   BACLOFEN  (LIORESAL ) 10 MG TABLET    Take 1 tablet (10 mg total) by mouth 3 (three) times daily.   BISACODYL  (DULCOLAX) 5 MG EC TABLET    Take 2 tablets (10 mg total) by mouth at bedtime as needed for moderate constipation.   BUSPIRONE  (BUSPAR ) 10 MG TABLET    Take 1 tablet (10 mg total) by mouth 3 (three) times daily.   CLONAZEPAM  (KLONOPIN ) 1 MG TABLET    Take 1 tablet (1 mg  total) by mouth 2 (two) times daily as needed.   DICLOFENAC  SODIUM (VOLTAREN ) 1 % GEL    Apply 2 g topically 3 (three) times daily.   FLUTICASONE  (FLONASE ) 50 MCG/ACT NASAL SPRAY    Place 1 spray into both nostrils 2 (two) times daily.   FOLIC ACID  (FOLVITE ) 1 MG TABLET    Take 1 tablet (1 mg total) by mouth daily.   GABAPENTIN  (NEURONTIN ) 300 MG CAPSULE    Take 1 capsule (300 mg total) by  mouth 4 (four) times daily.   IPRATROPIUM (ATROVENT) 0.06 % NASAL SPRAY    Place into the nose.   LIDOCAINE  (LMX) 4 % CREAM    Apply topically daily as needed (pain).   MONTELUKAST  (SINGULAIR ) 10 MG TABLET    Take 1 tablet (10 mg total) by mouth at bedtime.   OXYCODONE  (OXY IR/ROXICODONE ) 5 MG IMMEDIATE RELEASE TABLET    Take 1 tablet (5 mg total) by mouth every 6 (six) hours as needed for moderate pain (pain score 4-6).   PANTOPRAZOLE  (PROTONIX ) 40 MG TABLET    Take 1 tablet (40 mg total) by mouth daily.   POLYETHYLENE GLYCOL POWDER (GLYCOLAX /MIRALAX ) 17 GM/SCOOP POWDER    Take 17 g by mouth 2 (two) times daily.   PRAZOSIN  (MINIPRESS ) 1 MG CAPSULE    Take 1 capsule (1 mg total) by mouth at bedtime.   PROMETHAZINE (PHENERGAN) 25 MG TABLET    Take 25 mg by mouth every 6 (six) hours as needed.   QUETIAPINE  (SEROQUEL ) 100 MG TABLET    Take 1 tablet (100 mg total) by mouth daily.   QUETIAPINE  (SEROQUEL ) 200 MG TABLET    Take 1 tablet (200 mg total) by mouth at bedtime.  Modified Medications   No medications on file  Discontinued Medications   No medications on file   ----------------------------------------------------------------------------------------------------------------------  Follow-up: Return in about 1 month (around 06/18/2024) for evaluation, med refill.   Procedure: L5-S1 LESI with fluoroscopic guidance and without moderate sedation  NOTE: The risks, benefits, and expectations of the procedure have been discussed and explained to the patient who was understanding and in agreement with suggested  treatment plan. No guarantees were made.  DESCRIPTION OF PROCEDURE: Lumbar epidural steroid injection with no IV Versed , EKG, blood pressure, pulse, and pulse oximetry monitoring. The procedure was performed with the patient in the prone position under fluoroscopic guidance.  Sterile prep x3 was initiated and I then injected subcutaneous lidocaine  to the overlying L5-S1 site after its fluoroscopic identifictation.  Using strict aseptic technique, I then advanced an 18-gauge Tuohy epidural needle in the midline using interlaminar approach via loss-of-resistance to saline technique. There was negative aspiration for heme or  CSF.  I then confirmed position with both AP and Lateral fluoroscan.  2 cc of contrast dye were injected and a  total of 5 mL of Preservative-Free normal saline mixed with 40 mg of Kenalog  and 1cc Ropicaine 0.2 percent were injected incrementally via the  epidurally placed needle. The needle was removed. The patient tolerated the injection well and was convalesced and discharged to home in stable condition. Should the patient have any post procedure difficulty they have been instructed on how to contact us  for assistance.   Trigger point injection: The area overlying the aforementioned trigger points were prepped with alcohol. They were then injected with a 25-gauge needle with 4 cc of ropivacaine  0.2% and Decadron  4 mg at each site after negative aspiration for heme. This was performed after informed consent was obtained and risks and benefits reviewed. She tolerated this procedure without difficulty and was convalesced and discharged to home in stable condition for follow-up as mentioned.  @Hikari Tripp  Myra, MD@ [place] KANDICE Myra, MD

## 2024-05-28 ENCOUNTER — Telehealth: Payer: Self-pay | Admitting: Anesthesiology

## 2024-05-28 NOTE — Telephone Encounter (Signed)
 Patient is asking Dr Myra to write a letter stating that she needs to have a 2 bedroom apartment so she can have equipment to stretch and strengthen her leg and arm. Please notify patient if Dr Myra will do this and when she can pick up.

## 2024-05-28 NOTE — Telephone Encounter (Signed)
 Patient notified that Dr Myra would like her PCP to do the letter.

## 2024-06-16 ENCOUNTER — Encounter: Payer: Self-pay | Admitting: Anesthesiology

## 2024-06-16 ENCOUNTER — Ambulatory Visit: Attending: Anesthesiology | Admitting: Anesthesiology

## 2024-06-16 DIAGNOSIS — G894 Chronic pain syndrome: Secondary | ICD-10-CM | POA: Diagnosis not present

## 2024-06-16 DIAGNOSIS — M5442 Lumbago with sciatica, left side: Secondary | ICD-10-CM | POA: Diagnosis not present

## 2024-06-16 DIAGNOSIS — M25551 Pain in right hip: Secondary | ICD-10-CM | POA: Diagnosis not present

## 2024-06-16 DIAGNOSIS — G8929 Other chronic pain: Secondary | ICD-10-CM

## 2024-06-16 DIAGNOSIS — F1190 Opioid use, unspecified, uncomplicated: Secondary | ICD-10-CM

## 2024-06-16 DIAGNOSIS — M549 Dorsalgia, unspecified: Secondary | ICD-10-CM

## 2024-06-16 DIAGNOSIS — M25512 Pain in left shoulder: Secondary | ICD-10-CM

## 2024-06-16 DIAGNOSIS — Q675 Congenital deformity of spine: Secondary | ICD-10-CM

## 2024-06-16 DIAGNOSIS — M5441 Lumbago with sciatica, right side: Secondary | ICD-10-CM

## 2024-06-16 DIAGNOSIS — M545 Low back pain, unspecified: Secondary | ICD-10-CM

## 2024-06-16 DIAGNOSIS — M542 Cervicalgia: Secondary | ICD-10-CM

## 2024-06-16 DIAGNOSIS — M5431 Sciatica, right side: Secondary | ICD-10-CM

## 2024-06-16 MED ORDER — GABAPENTIN 300 MG PO CAPS: 300.0000 mg | ORAL_CAPSULE | Freq: Four times a day (QID) | ORAL | 2 refills | Status: DC

## 2024-06-16 MED ORDER — BACLOFEN 10 MG PO TABS: 10.0000 mg | ORAL_TABLET | Freq: Three times a day (TID) | ORAL | 2 refills | Status: AC

## 2024-06-16 NOTE — Progress Notes (Unsigned)
 Virtual Visit via Telephone Note  I connected with Tami Hopkins on 06/16/24 at  1:00 PM EDT by telephone and verified that I am speaking with the correct person using two identifiers.  Location: Patient: Home Provider: [facility]   I discussed the limitations, risks, security and privacy concerns of performing an evaluation and management service by telephone and the availability of in person appointments. I also discussed with the patient that there may be a patient responsible charge related to this service. The patient expressed understanding and agreed to proceed.   History of Present Illness: I spoke with Tami Hopkins via telephone as we are unable link to the video portion of the conference.  She reports that she did not respond with any significant improvement in her low back or left lower extremity sciatica pain following the recent epidural injection.  She still takes her baclofen  2-3 times a day for muscle spasm and this works well and continues to take her gabapentin  3-4 times a day at the 300 mg strength giving her some relief from the sciatica symptoms.  This also helps her sleep better at night.  No side effects with the medicines are reported.  She is working on some stretching strengthening exercises and trying to stay active.  Otherwise she is in her usual state of health.  In regards to the left lower leg pain she has some pain in her left ankle and foot that continues to be problematic and she wonders if the intensity of that pain has made her response to the epidural seen less effective.  She is not getting shooting pain down the leg as she was before.  She still has some back spasms but these are generally tolerable and somewhat better with stretching.  No change in lower extremity strength or function is noted at this time.  No problems with her bowel or bladder function is reported.  Review of systems: General: No fevers or chills Pulmonary: No shortness of  breath or dyspnea Cardiac: No angina or palpitations or lightheadedness GI: No abdominal pain or constipation Psych: No depression    Observations/Objective:  Current Outpatient Medications:    baclofen  (LIORESAL ) 10 MG tablet, Take 1 tablet (10 mg total) by mouth in the morning, at noon, and at bedtime., Disp: 75 tablet, Rfl: 2   albuterol  (VENTOLIN  HFA) 108 (90 Base) MCG/ACT inhaler, Inhale 2 puffs into the lungs every 6 (six) hours as needed for wheezing or shortness of breath., Disp: 6.7 g, Rfl: 2   bisacodyl  (DULCOLAX) 5 MG EC tablet, Take 2 tablets (10 mg total) by mouth at bedtime as needed for moderate constipation., Disp: 30 tablet, Rfl: 0   busPIRone  (BUSPAR ) 10 MG tablet, Take 1 tablet (10 mg total) by mouth 3 (three) times daily., Disp: 90 tablet, Rfl: 1   clonazePAM  (KLONOPIN ) 1 MG tablet, Take 1 tablet (1 mg total) by mouth 2 (two) times daily as needed., Disp: 60 tablet, Rfl: 0   diclofenac  Sodium (VOLTAREN ) 1 % GEL, Apply 2 g topically 3 (three) times daily., Disp: 100 g, Rfl: 0   fluticasone  (FLONASE ) 50 MCG/ACT nasal spray, Place 1 spray into both nostrils 2 (two) times daily., Disp: , Rfl:    folic acid  (FOLVITE ) 1 MG tablet, Take 1 tablet (1 mg total) by mouth daily., Disp: 30 tablet, Rfl: 0   gabapentin  (NEURONTIN ) 300 MG capsule, Take 1 capsule (300 mg total) by mouth 4 (four) times daily., Disp: 120 capsule, Rfl: 2  ipratropium (ATROVENT) 0.06 % nasal spray, Place into the nose., Disp: , Rfl:    lidocaine  (LMX) 4 % cream, Apply topically daily as needed (pain)., Disp: 28 g, Rfl: 0   montelukast  (SINGULAIR ) 10 MG tablet, Take 1 tablet (10 mg total) by mouth at bedtime., Disp: 30 tablet, Rfl: 1   oxyCODONE  (OXY IR/ROXICODONE ) 5 MG immediate release tablet, Take 1 tablet (5 mg total) by mouth every 6 (six) hours as needed for moderate pain (pain score 4-6). (Patient not taking: Reported on 05/18/2024), Disp: 30 tablet, Rfl: 0   pantoprazole  (PROTONIX ) 40 MG tablet, Take 1  tablet (40 mg total) by mouth daily., Disp: 30 tablet, Rfl: 1   polyethylene glycol powder (GLYCOLAX /MIRALAX ) 17 GM/SCOOP powder, Take 17 g by mouth 2 (two) times daily., Disp: 238 g, Rfl: 0   prazosin  (MINIPRESS ) 1 MG capsule, Take 1 capsule (1 mg total) by mouth at bedtime., Disp: 30 capsule, Rfl: 1   promethazine (PHENERGAN) 25 MG tablet, Take 25 mg by mouth every 6 (six) hours as needed., Disp: , Rfl:    QUEtiapine  (SEROQUEL ) 100 MG tablet, Take 1 tablet (100 mg total) by mouth daily., Disp: 30 tablet, Rfl: 1   QUEtiapine  (SEROQUEL ) 200 MG tablet, Take 1 tablet (200 mg total) by mouth at bedtime., Disp: 30 tablet, Rfl: 1   Past Medical History:  Diagnosis Date   Anxiety    Asthma    Bursitis of hip bilateral    Hyperlipidemia    PTSD (post-traumatic stress disorder)    Scoliosis    Assessment and Plan:  1. Bilateral sciatica   2. Chronic hip pain, right   3. Back pain at L4-L5 level   4. Chronic right-sided low back pain with right-sided sciatica   5. Chronic pain syndrome   6. Cervicalgia   7. Congenital scoliosis   8. Chronic, continuous use of opioids   9. Musculoskeletal back pain   10. Pain in joint of left shoulder    Based on conversation and after review of the [place]  practitioner database information gets appropriate refill her medicines for the next 3 months.  Will keep her on the baclofen  10 mg tablets taking these 2-3 times a day as she is currently doing for muscle spasm and back spasm.  Additionally continue the gabapentin  as this is well-tolerated and helping with some of the neuralgic pain that she has had chronically.  No other changes are initiated.  Will defer on any repeat injections at this time.  Continue follow-up with her primary care physician for baseline medical care and I have advised her to see an orthopedist in regards to more specific evaluation of her left ankle and foot pain.  This does not seem radicular at this point.  She plans to do so.   Will schedule her for 52-month return Follow Up Instructions:    I discussed the assessment and treatment plan with the patient. The patient was provided an opportunity to ask questions and all were answered. The patient agreed with the plan and demonstrated an understanding of the instructions.   The patient was advised to call back or seek an in-person evaluation if the symptoms worsen or if the condition fails to improve as anticipated.  I provided 30 minutes of non-face-to-face time during this encounter.   Lynwood KANDICE Clause, MD

## 2024-07-01 ENCOUNTER — Telehealth: Payer: Self-pay | Admitting: Anesthesiology

## 2024-07-01 NOTE — Telephone Encounter (Signed)
 Patient states she needs a referral from Dr Myra to [facility], she has appt scheduled 07-15-24   Dr Myra please put in referral to [facility] and we will send along with you notes.   Fax [phone]

## 2024-08-12 ENCOUNTER — Telehealth: Payer: Self-pay | Admitting: Anesthesiology

## 2024-08-12 NOTE — Telephone Encounter (Signed)
 Pharmacy called asking for all he prescriptions to be sent to them and they sent a fax already. They have not received any conformation from the clinic and will call back in 4 days.

## 2024-08-12 NOTE — Telephone Encounter (Signed)
 Returned pharmacy phone call, asking for refill on Klonopin .  Dr Myra is not the prescribing provider for this medication.  Pharmacy asked to please disregard.

## 2024-08-21 ENCOUNTER — Other Ambulatory Visit: Payer: Self-pay | Admitting: Obstetrics and Gynecology

## 2024-09-28 ENCOUNTER — Telehealth: Payer: Self-pay

## 2024-09-28 NOTE — Telephone Encounter (Signed)
 She has a new pharmacy. [facility] in [place] Phone number is [phone]

## 2024-10-02 ENCOUNTER — Other Ambulatory Visit: Payer: Self-pay | Admitting: Anesthesiology

## 2024-10-07 ENCOUNTER — Other Ambulatory Visit: Payer: Self-pay | Admitting: Anesthesiology

## 2024-10-08 ENCOUNTER — Encounter: Payer: Self-pay | Admitting: Anesthesiology

## 2024-10-08 ENCOUNTER — Telehealth: Payer: Self-pay | Admitting: Anesthesiology

## 2024-10-08 ENCOUNTER — Ambulatory Visit: Attending: Anesthesiology | Admitting: Anesthesiology

## 2024-10-08 VITALS — BP 122/87 | HR 97 | Temp 97.1°F | Resp 20 | Ht 64.0 in | Wt 174.0 lb

## 2024-10-08 DIAGNOSIS — M545 Low back pain, unspecified: Secondary | ICD-10-CM | POA: Diagnosis present

## 2024-10-08 DIAGNOSIS — M542 Cervicalgia: Secondary | ICD-10-CM | POA: Diagnosis present

## 2024-10-08 DIAGNOSIS — M549 Dorsalgia, unspecified: Secondary | ICD-10-CM | POA: Diagnosis present

## 2024-10-08 DIAGNOSIS — Z79891 Long term (current) use of opiate analgesic: Secondary | ICD-10-CM

## 2024-10-08 DIAGNOSIS — Q675 Congenital deformity of spine: Secondary | ICD-10-CM | POA: Diagnosis present

## 2024-10-08 DIAGNOSIS — G894 Chronic pain syndrome: Secondary | ICD-10-CM | POA: Insufficient documentation

## 2024-10-08 DIAGNOSIS — M5431 Sciatica, right side: Secondary | ICD-10-CM | POA: Diagnosis present

## 2024-10-08 DIAGNOSIS — M25811 Other specified joint disorders, right shoulder: Secondary | ICD-10-CM | POA: Diagnosis present

## 2024-10-08 DIAGNOSIS — M5432 Sciatica, left side: Secondary | ICD-10-CM | POA: Diagnosis present

## 2024-10-08 DIAGNOSIS — F1190 Opioid use, unspecified, uncomplicated: Secondary | ICD-10-CM | POA: Diagnosis present

## 2024-10-08 DIAGNOSIS — M25551 Pain in right hip: Secondary | ICD-10-CM | POA: Insufficient documentation

## 2024-10-08 DIAGNOSIS — G8929 Other chronic pain: Secondary | ICD-10-CM | POA: Diagnosis present

## 2024-10-08 DIAGNOSIS — M5441 Lumbago with sciatica, right side: Secondary | ICD-10-CM | POA: Diagnosis not present

## 2024-10-08 DIAGNOSIS — M5442 Lumbago with sciatica, left side: Secondary | ICD-10-CM | POA: Diagnosis not present

## 2024-10-08 MED ORDER — GABAPENTIN 300 MG PO CAPS: 300.0000 mg | ORAL_CAPSULE | Freq: Four times a day (QID) | ORAL | 2 refills | Status: AC

## 2024-10-08 MED ORDER — BACLOFEN 10 MG PO TABS: 10.0000 mg | ORAL_TABLET | Freq: Three times a day (TID) | ORAL | 2 refills | Status: AC

## 2024-10-08 NOTE — Telephone Encounter (Signed)
 Pt stated she would like to see if she could get a one month prescription called in. She doesn't feel she could make the appt.

## 2024-10-08 NOTE — Telephone Encounter (Signed)
Message sent to Dr Adams.  

## 2024-10-08 NOTE — Telephone Encounter (Signed)
 Tami Hopkins is scheduled to come in today for MM appt. She is still having some problems with foot being paralyzed She is asking if Dr Myra would send 1 month of scripts in and she will have daughter bring her in next month.

## 2024-10-08 NOTE — Telephone Encounter (Signed)
 Dr Myra said that he would send in prescription.

## 2024-10-08 NOTE — Progress Notes (Signed)
 Safety precautions to be maintained throughout the outpatient stay will include: orient to surroundings, keep bed in low position, maintain call bell within reach at all times, provide assistance with transfer out of bed and ambulation.   Does not take narcotics at this time. No pills to count.

## 2024-10-12 LAB — TOXASSURE SELECT 13 (MW), URINE

## 2024-10-28 DIAGNOSIS — M25811 Other specified joint disorders, right shoulder: Secondary | ICD-10-CM | POA: Insufficient documentation

## 2024-10-28 NOTE — Progress Notes (Signed)
 "  Subjective:  Patient ID: Tami Hopkins, female    DOB: [birth year]  Age: 64 y.o. MRN: [id number]  CC: Back Pain (Right leg and arm numb and painful. Feels like gravel under skin)   Procedure: None  HPI Tami Hopkins presents for reevaluation.  She was last seen a few months ago and has been doing reasonably well with her low back.  This has been stable with no recent changes.  Her back pain seems to be consistent and related to her level of activity but is generally controlled with her baclofen  and gabapentin .  She has previously been on narcotics but had problems with opioid management.  At present she is doing daily stretching and trying to walk more aggressively and has been more active and taking a greater role in her back pain management and this is reportedly working.  She also has some periodic neck pain but this is also been under control.  She does report a mass on her right posterior deltoid that she would like me to look at that has been present for some time.  Outpatient Medications Prior to Visit  Medication Sig Dispense Refill   albuterol  (VENTOLIN  HFA) 108 (90 Base) MCG/ACT inhaler Inhale 2 puffs into the lungs every 6 (six) hours as needed for wheezing or shortness of breath. 6.7 g 2   ARNUITY ELLIPTA  200 MCG/ACT AEPB Take 1 puff by mouth daily.     Azelastine HCl 137 MCG/SPRAY SOLN Place 1 spray into both nostrils 2 (two) times daily.     bisacodyl  (DULCOLAX) 5 MG EC tablet Take 2 tablets (10 mg total) by mouth at bedtime as needed for moderate constipation. 30 tablet 0   busPIRone  (BUSPAR ) 10 MG tablet Take 1 tablet (10 mg total) by mouth 3 (three) times daily. 90 tablet 1   clonazePAM  (KLONOPIN ) 1 MG tablet Take 1 tablet (1 mg total) by mouth 2 (two) times daily as needed. 60 tablet 0   fluticasone  (FLONASE ) 50 MCG/ACT nasal spray Place 1 spray into both nostrils 2 (two) times daily.     loperamide  (IMODIUM ) 2 MG capsule Take 2 mg by mouth as needed.     prazosin   (MINIPRESS ) 1 MG capsule Take 1 capsule (1 mg total) by mouth at bedtime. 30 capsule 1   promethazine (PHENERGAN) 25 MG tablet Take 25 mg by mouth every 6 (six) hours as needed.     QUEtiapine  (SEROQUEL ) 100 MG tablet Take 1 tablet (100 mg total) by mouth daily. 30 tablet 1   gabapentin  (NEURONTIN ) 300 MG capsule Take 1 capsule (300 mg total) by mouth 4 (four) times daily. 120 capsule 2   diclofenac  Sodium (VOLTAREN ) 1 % GEL Apply 2 g topically 3 (three) times daily. (Patient not taking: Reported on 10/08/2024) 100 g 0   folic acid  (FOLVITE ) 1 MG tablet Take 1 tablet (1 mg total) by mouth daily. (Patient not taking: Reported on 10/08/2024) 30 tablet 0   ipratropium (ATROVENT) 0.06 % nasal spray Place into the nose. (Patient not taking: Reported on 10/08/2024)     lidocaine  (LMX) 4 % cream Apply topically daily as needed (pain). (Patient not taking: Reported on 10/08/2024) 28 g 0   montelukast  (SINGULAIR ) 10 MG tablet Take 1 tablet (10 mg total) by mouth at bedtime. (Patient not taking: Reported on 10/08/2024) 30 tablet 1   oxyCODONE  (OXY IR/ROXICODONE ) 5 MG immediate release tablet Take 1 tablet (5 mg total) by mouth every 6 (six) hours as needed for moderate  pain (pain score 4-6). (Patient not taking: Reported on 10/08/2024) 30 tablet 0   pantoprazole  (PROTONIX ) 40 MG tablet Take 1 tablet (40 mg total) by mouth daily. (Patient not taking: Reported on 10/08/2024) 30 tablet 1   polyethylene glycol powder (GLYCOLAX /MIRALAX ) 17 GM/SCOOP powder Take 17 g by mouth 2 (two) times daily. (Patient not taking: Reported on 10/08/2024) 238 g 0   QUEtiapine  (SEROQUEL ) 200 MG tablet Take 1 tablet (200 mg total) by mouth at bedtime. (Patient not taking: Reported on 10/08/2024) 30 tablet 1   No facility-administered medications prior to visit.    Review of Systems CNS: No confusion or sedation Cardiac: No angina or palpitations GI: No abdominal pain or constipation Constitutional: No nausea vomiting fevers or  chills  Objective:  BP 122/87 (BP Location: Left Arm, Patient Position: Sitting)   Pulse 97   Temp (!) 97.1 F (36.2 C) (Skin)   Resp 20   Ht 5' 4 (1.626 m)   Wt 174 lb (78.9 kg)   SpO2 98%   BMI 29.87 kg/m    BP Readings from Last 3 Encounters:  10/08/24 122/87  05/18/24 112/74  11/21/23 (!) 125/108     Wt Readings from Last 3 Encounters:  10/08/24 174 lb (78.9 kg)  05/18/24 170 lb (77.1 kg)  11/21/23 178 lb (80.7 kg)     Physical Exam Pt is alert and oriented PERRL EOMI HEART IS RRR no murmur or rub LCTA no wheezing or rales MUSCULOSKELETAL evaluation of the neck is unremarkable she has some mild parascapular tenderness but good range of motion at the atlantooccipital joint.  Good range of motion at the glenohumeral joint bilaterally.  There is a soft 4 cm circumscribed mass on the posterior portion of the right deltoid that feels movable.  She has good strength through the upper extremities with no identifiable weakness.  Some mild tenderness in the paraspinous muscles of the low back but walking with a slightly antalgic gait and her muscle tone and bulk is at baseline.  Labs  No results found for: HGBA1C Lab Results  Component Value Date   CREATININE 0.67 12/23/2023    -------------------------------------------------------------------------------------------------------------------- Lab Results  Component Value Date   WBC 5.8 12/23/2023   HGB 12.3 12/23/2023   HCT 37.3 12/23/2023   PLT 316 12/23/2023   GLUCOSE 132 (H) 12/23/2023   TRIG 199 (H) 12/06/2023   ALT 61 (H) 12/04/2023   AST 118 (H) 12/04/2023   NA 138 12/23/2023   K 3.9 12/23/2023   CL 104 12/23/2023   CREATININE 0.67 12/23/2023   BUN 22 12/23/2023   CO2 26 12/23/2023   TSH 0.912 05/11/2015   INR 1.1 12/04/2023    --------------------------------------------------------------------------------------------------------------------- MM 3D SCREENING MAMMOGRAM BILATERAL BREAST Result  Date: 05/20/2024 CLINICAL DATA:  Screening. EXAM: DIGITAL SCREENING BILATERAL MAMMOGRAM WITH TOMOSYNTHESIS AND CAD TECHNIQUE: Bilateral screening digital craniocaudal and mediolateral oblique mammograms were obtained. Bilateral screening digital breast tomosynthesis was performed. The images were evaluated with computer-aided detection. COMPARISON:  Previous exam(s). ACR Breast Density Category b: There are scattered areas of fibroglandular density. FINDINGS: There are no findings suspicious for malignancy. IMPRESSION: No mammographic evidence of malignancy. A result letter of this screening mammogram will be mailed directly to the patient. RECOMMENDATION: Screening mammogram in one year. (Code:SM-B-01Y) BI-RADS CATEGORY  1: Negative. Electronically Signed   By: Debby Satterfield M.D.   On: 05/20/2024 16:12   DG PAIN [facility]IN[facility] C-ARM 1-60 MIN NO REPORT Result Date: 05/18/2024 Fluoro was used, but no Radiologist  interpretation will be provided. Please refer to NOTES tab for provider progress note.    Assessment & Plan:   Tami Hopkins was seen today for back pain.  Diagnoses and all orders for this visit:  Bilateral sciatica  Chronic hip pain, right  Back pain at L4-L5 level  Chronic pain syndrome  Cervicalgia  Congenital scoliosis  Chronic, continuous use of opioids -     ToxASSURE Select 13 (MW), Urine  Musculoskeletal back pain  Other orders -     gabapentin  (NEURONTIN ) 300 MG capsule; Take 1 capsule (300 mg total) by mouth 4 (four) times daily. -     baclofen  (LIORESAL ) 10 MG tablet; Take 1 tablet (10 mg total) by mouth 3 (three) times daily.        ----------------------------------------------------------------------------------------------------------------------  Problem List Items Addressed This Visit       Unprioritized   Chronic hip pain, right (Chronic)   Relevant Medications   gabapentin  (NEURONTIN ) 300 MG capsule   baclofen  (LIORESAL ) 10 MG tablet   Chronic pain  syndrome (Chronic)   Relevant Medications   gabapentin  (NEURONTIN ) 300 MG capsule   baclofen  (LIORESAL ) 10 MG tablet   Congenital scoliosis (Chronic)   Cervicalgia   Other Visit Diagnoses       Bilateral sciatica    -  Primary   Relevant Medications   gabapentin  (NEURONTIN ) 300 MG capsule   baclofen  (LIORESAL ) 10 MG tablet     Back pain at L4-L5 level       Relevant Medications   baclofen  (LIORESAL ) 10 MG tablet     Chronic, continuous use of opioids       Relevant Orders   ToxASSURE Select 13 (MW), Urine (Completed)     Musculoskeletal back pain       Relevant Medications   baclofen  (LIORESAL ) 10 MG tablet         ----------------------------------------------------------------------------------------------------------------------  1. Bilateral sciatica (Primary) Continue current management of her low back pain with stretching strengthening and core routine.  Continue with her current medications with the Lioresal  and gabapentin  which are helping with spasm and sciatica symptoms.  She can also use Tylenol  as she is currently doing at 2 tablets 3 times a day.  2. Chronic hip pain, right As above  3. Back pain at L4-L5 level As above  4. Chronic pain syndrome As above  5. Cervicalgia Continue core stretching exercises for her neck  6. Congenital scoliosis   7. Chronic, continuous use of opioids This has been discontinued - ToxASSURE Select 13 (MW), Urine  8. Musculoskeletal back pain 9.  Lipoma right posterior deltoid.  I have asked her to seek general surgical evaluation for workup.  She states she will comply.    ----------------------------------------------------------------------------------------------------------------------  I am having Tami Hopkins start on baclofen . I am also having her maintain her fluticasone , ipratropium, folic acid , promethazine, bisacodyl , polyethylene glycol powder, albuterol , diclofenac  Sodium, oxyCODONE , prazosin ,  busPIRone , QUEtiapine , QUEtiapine , pantoprazole , clonazePAM , montelukast , lidocaine , Azelastine HCl, Arnuity Ellipta , loperamide , and gabapentin .   Meds ordered this encounter  Medications   gabapentin  (NEURONTIN ) 300 MG capsule    Sig: Take 1 capsule (300 mg total) by mouth 4 (four) times daily.    Dispense:  120 capsule    Refill:  2   baclofen  (LIORESAL ) 10 MG tablet    Sig: Take 1 tablet (10 mg total) by mouth 3 (three) times daily.    Dispense:  90 tablet    Refill:  2   Patient's Medications  New  Prescriptions   BACLOFEN  (LIORESAL ) 10 MG TABLET    Take 1 tablet (10 mg total) by mouth 3 (three) times daily.  Previous Medications   ALBUTEROL  (VENTOLIN  HFA) 108 (90 BASE) MCG/ACT INHALER    Inhale 2 puffs into the lungs every 6 (six) hours as needed for wheezing or shortness of breath.   ARNUITY ELLIPTA  200 MCG/ACT AEPB    Take 1 puff by mouth daily.   AZELASTINE HCL 137 MCG/SPRAY SOLN    Place 1 spray into both nostrils 2 (two) times daily.   BISACODYL  (DULCOLAX) 5 MG EC TABLET    Take 2 tablets (10 mg total) by mouth at bedtime as needed for moderate constipation.   BUSPIRONE  (BUSPAR ) 10 MG TABLET    Take 1 tablet (10 mg total) by mouth 3 (three) times daily.   CLONAZEPAM  (KLONOPIN ) 1 MG TABLET    Take 1 tablet (1 mg total) by mouth 2 (two) times daily as needed.   DICLOFENAC  SODIUM (VOLTAREN ) 1 % GEL    Apply 2 g topically 3 (three) times daily.   FLUTICASONE  (FLONASE ) 50 MCG/ACT NASAL SPRAY    Place 1 spray into both nostrils 2 (two) times daily.   FOLIC ACID  (FOLVITE ) 1 MG TABLET    Take 1 tablet (1 mg total) by mouth daily.   IPRATROPIUM (ATROVENT) 0.06 % NASAL SPRAY    Place into the nose.   LIDOCAINE  (LMX) 4 % CREAM    Apply topically daily as needed (pain).   LOPERAMIDE  (IMODIUM ) 2 MG CAPSULE    Take 2 mg by mouth as needed.   MONTELUKAST  (SINGULAIR ) 10 MG TABLET    Take 1 tablet (10 mg total) by mouth at bedtime.   OXYCODONE  (OXY IR/ROXICODONE ) 5 MG IMMEDIATE RELEASE TABLET     Take 1 tablet (5 mg total) by mouth every 6 (six) hours as needed for moderate pain (pain score 4-6).   PANTOPRAZOLE  (PROTONIX ) 40 MG TABLET    Take 1 tablet (40 mg total) by mouth daily.   POLYETHYLENE GLYCOL POWDER (GLYCOLAX /MIRALAX ) 17 GM/SCOOP POWDER    Take 17 g by mouth 2 (two) times daily.   PRAZOSIN  (MINIPRESS ) 1 MG CAPSULE    Take 1 capsule (1 mg total) by mouth at bedtime.   PROMETHAZINE (PHENERGAN) 25 MG TABLET    Take 25 mg by mouth every 6 (six) hours as needed.   QUETIAPINE  (SEROQUEL ) 100 MG TABLET    Take 1 tablet (100 mg total) by mouth daily.   QUETIAPINE  (SEROQUEL ) 200 MG TABLET    Take 1 tablet (200 mg total) by mouth at bedtime.  Modified Medications   Modified Medication Previous Medication   GABAPENTIN  (NEURONTIN ) 300 MG CAPSULE gabapentin  (NEURONTIN ) 300 MG capsule      Take 1 capsule (300 mg total) by mouth 4 (four) times daily.    Take 1 capsule (300 mg total) by mouth 4 (four) times daily.  Discontinued Medications   No medications on file   ----------------------------------------------------------------------------------------------------------------------  Follow-up: Return in about 3 months (around 01/06/2025) for evaluation, med refill.    Lynwood KANDICE Clause, MD  "
